# Patient Record
Sex: Female | Born: 1995 | Race: Black or African American | Hispanic: No | Marital: Single
Health system: Southern US, Community
[De-identification: ages and names within clinical notes are randomized; demographics above are authoritative.]

## PROBLEM LIST (undated history)

## (undated) ENCOUNTER — Emergency Department (HOSPITAL_COMMUNITY): Admission: EM | Source: Home / Self Care

## (undated) DIAGNOSIS — F259 Schizoaffective disorder, unspecified: Secondary | ICD-10-CM

## (undated) DIAGNOSIS — F419 Anxiety disorder, unspecified: Secondary | ICD-10-CM

## (undated) DIAGNOSIS — F32A Depression, unspecified: Secondary | ICD-10-CM

## (undated) DIAGNOSIS — F329 Major depressive disorder, single episode, unspecified: Secondary | ICD-10-CM

## (undated) DIAGNOSIS — F209 Schizophrenia, unspecified: Secondary | ICD-10-CM

## (undated) DIAGNOSIS — R4689 Other symptoms and signs involving appearance and behavior: Secondary | ICD-10-CM

---
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
## 2017-04-17 ENCOUNTER — Emergency Department (HOSPITAL_COMMUNITY)
Admission: EM | Admit: 2017-04-17 | Discharge: 2017-04-18 | Disposition: A | Discharge status: Other Acute Inpatient Hospital | Payer: Medicaid Other | Attending: Emergency Medicine | Admitting: Emergency Medicine

## 2017-04-17 DIAGNOSIS — F23 Brief psychotic disorder: Secondary | ICD-10-CM

## 2017-04-17 LAB — CBC WITH DIFFERENTIAL/PLATELET
Basophils Absolute: 0 10*3/uL (ref 0.0–0.1)
Basophils Relative: 0 %
EOS PCT: 0 %
Eosinophils Absolute: 0 10*3/uL (ref 0.0–0.7)
HCT: 37.6 % (ref 36.0–46.0)
Hemoglobin: 12.9 g/dL (ref 12.0–15.0)
LYMPHS ABS: 1.9 10*3/uL (ref 0.7–4.0)
LYMPHS PCT: 24 %
MCH: 30.7 pg (ref 26.0–34.0)
MCHC: 34.3 g/dL (ref 30.0–36.0)
MCV: 89.5 fL (ref 78.0–100.0)
MONO ABS: 0.6 10*3/uL (ref 0.1–1.0)
Monocytes Relative: 7 %
Neutro Abs: 5.5 10*3/uL (ref 1.7–7.7)
Neutrophils Relative %: 69 %
PLATELETS: 252 10*3/uL (ref 150–400)
RBC: 4.2 MIL/uL (ref 3.87–5.11)
RDW: 13 % (ref 11.5–15.5)
WBC: 7.9 10*3/uL (ref 4.0–10.5)

## 2017-04-17 LAB — COMPREHENSIVE METABOLIC PANEL
ALK PHOS: 67 U/L (ref 38–126)
ALT: 18 U/L (ref 14–54)
ANION GAP: 11 (ref 5–15)
AST: 25 U/L (ref 15–41)
Albumin: 4.5 g/dL (ref 3.5–5.0)
BUN: 18 mg/dL (ref 6–20)
CALCIUM: 9.6 mg/dL (ref 8.9–10.3)
CO2: 26 mmol/L (ref 22–32)
Chloride: 106 mmol/L (ref 101–111)
Creatinine, Ser: 1.02 mg/dL — ABNORMAL HIGH (ref 0.44–1.00)
GFR calc non Af Amer: >60 mL/min (ref >60)
Glucose, Bld: 89 mg/dL (ref 65–99)
Potassium: 3.6 mmol/L (ref 3.5–5.1)
SODIUM: 143 mmol/L (ref 135–145)
TOTAL PROTEIN: 9.2 g/dL — AB (ref 6.5–8.1)
Total Bilirubin: 0.8 mg/dL (ref 0.3–1.2)

## 2017-04-17 LAB — RAPID URINE DRUG SCREEN, HOSP PERFORMED
Amphetamines: NOT DETECTED
BENZODIAZEPINES: NOT DETECTED
Barbiturates: NOT DETECTED
COCAINE: NOT DETECTED
Opiates: NOT DETECTED
Tetrahydrocannabinol: NOT DETECTED

## 2017-04-17 LAB — I-STAT BETA HCG BLOOD, ED (MC, WL, AP ONLY): I-stat hCG, quantitative: <5 m[IU]/mL (ref <5)

## 2017-04-17 LAB — ETHANOL: Alcohol, Ethyl (B): <5 mg/dL (ref <5)

## 2017-04-17 LAB — ACETAMINOPHEN LEVEL: Acetaminophen (Tylenol), Serum: <10 ug/mL — ABNORMAL LOW (ref 10–30)

## 2017-04-17 LAB — SALICYLATE LEVEL: Salicylate Lvl: <7 mg/dL (ref 2.8–30.0)

## 2017-04-17 MED ORDER — ZIPRASIDONE MESYLATE 20 MG IM SOLR
INTRAMUSCULAR | Status: AC
  Filled 2017-04-17: qty 20

## 2017-04-17 MED ORDER — IBUPROFEN 200 MG PO TABS: 600.0000 mg | ORAL_TABLET | Freq: Four times a day (QID) | ORAL | Status: DC | PRN

## 2017-04-17 MED ORDER — LORAZEPAM 1 MG PO TABS
1.0000 mg | ORAL_TABLET | Freq: Four times a day (QID) | ORAL | Status: DC | PRN
Start: 2017-04-17 — End: 2017-04-18

## 2017-04-17 MED ORDER — DIPHENHYDRAMINE HCL 50 MG/ML IJ SOLN
INTRAMUSCULAR | Status: AC
  Administered 2017-04-17: 12:00:00
  Filled 2017-04-17: qty 1

## 2017-04-17 MED ORDER — ONDANSETRON 4 MG PO TBDP: 4.0000 mg | ORAL_TABLET | Freq: Three times a day (TID) | ORAL | Status: DC | PRN

## 2017-04-17 MED ORDER — DIPHENHYDRAMINE HCL 50 MG/ML IJ SOLN: 50.0000 mg | Freq: Once | INTRAMUSCULAR | Status: DC

## 2017-04-17 MED ORDER — ACETAMINOPHEN 325 MG PO TABS: 650.0000 mg | ORAL_TABLET | Freq: Four times a day (QID) | ORAL | Status: DC | PRN

## 2017-04-17 MED ORDER — ZIPRASIDONE MESYLATE 20 MG IM SOLR
20.0000 mg | Freq: Once | INTRAMUSCULAR | Status: AC
  Administered 2017-04-17: 20 mg via INTRAMUSCULAR

## 2017-04-17 MED ORDER — LORAZEPAM 2 MG/ML IJ SOLN
INTRAMUSCULAR | Status: AC
  Administered 2017-04-17: 2 mg
  Filled 2017-04-17: qty 1

## 2017-04-17 NOTE — ED Provider Notes (Addendum)
[facility]-EMERGENCY DEPT Provider Note   CSN: [id number] Arrival date & time: 04/17/17  1157     History   Chief Complaint Chief Complaint  Patient presents with  . Medical Clearance    HPI Lindsey Richmond is a 21 y.o. female.  HPI  LEVEL 5 CAVEAT FOR PSYCHIATRIC DISORDER  Pt brought in to the ER with cc of psychoses. Pt is Lindsey Richmond. Per EMS pt was found in the streets [place], naked and holding a sign HEAVEN / HELL. Pt was calling for Jesus, but was calm, as soon as she arrived to the ER she became aggressive and started yelling. Pt doesn't give me much history. She tells me that "Jesus loves her". She tells me her name is Lindsey Richmond.    No past medical history on file.  There are no active problems to display for this patient.   No past surgical history on file.  OB History    No data available       Home Medications    Prior to Admission medications   Not on File    Family History No family history on file.  Social History Social History  Substance Use Topics  . Smoking status: Not on file  . Smokeless tobacco: Not on file  . Alcohol use Not on file     Allergies   Patient has no allergy information on record.   Review of Systems Review of Systems  Unable to perform ROS: Psychiatric disorder     Physical Exam Updated Vital Signs BP (!) 107/56 (BP Location: Right Leg)   Pulse 93   Temp 98.3 F (36.8 C) (Oral)   Resp 16   SpO2 96%   Physical Exam  Constitutional: She appears well-developed.  HENT:  Head: Normocephalic and atraumatic.  Eyes: EOM are normal.  Neck: Normal range of motion. Neck supple.  Cardiovascular:  TACHYCARDIA  Pulmonary/Chest: Effort normal.  Abdominal: Bowel sounds are normal.  Neurological: She is alert.  Skin: Skin is warm and dry.  Psychiatric:  Responding to internal stimuli  Nursing note and vitals reviewed.    ED Treatments / Results  Labs (all labs ordered are listed, but only  abnormal results are displayed) Labs Reviewed  COMPREHENSIVE METABOLIC PANEL - Abnormal; Notable for the following:       Result Value   Creatinine, Ser 1.02 (*)    Total Protein 9.2 (*)    All other components within normal limits  ACETAMINOPHEN LEVEL - Abnormal; Notable for the following:    Acetaminophen (Tylenol), Serum <10 (*)    All other components within normal limits  CBC WITH DIFFERENTIAL/PLATELET  ETHANOL  SALICYLATE LEVEL  RAPID URINE DRUG SCREEN, HOSP PERFORMED  I-STAT BETA HCG BLOOD, ED ([facility], [facility], [facility] ONLY)    EKG  EKG Interpretation  Date/Time:  Tuesday April 17 2017 13:41:29 EDT Ventricular Rate:  125 PR Interval:  182 QRS Duration: 74 QT Interval:  214 QTC Calculation: 308 R Axis:   20 Text Interpretation:  Sinus tachycardia Right atrial enlargement Nonspecific T wave abnormality Abnormal ECG No acute changes Nonspecific ST and T wave abnormality No old tracing to compare Confirmed by Derwood Kaplananavati, Leeloo Silverthorne [phone]([id number]) on 04/17/2017 5:36:04 PM        EKG Interpretation  Date/Time:  Tuesday April 17 2017 17:43:43 EDT Ventricular Rate:  92 PR Interval:  170 QRS Duration: 80 QT Interval:  370 QTC Calculation: 457 R Axis:   35 Text Interpretation:  Normal sinus rhythm  Nonspecific T wave abnormality Abnormal ECG tachycardia resolved Confirmed by Derwood Kaplan [phone]) on 04/17/2017 5:50:36 PM         Radiology No results found.  Procedures Procedures (including critical care time)  Medications Ordered in ED Medications  ziprasidone (GEODON) 20 MG injection (  Not Given 04/17/17 1315)  diphenhydrAMINE (BENADRYL) injection 50 mg (50 mg Intramuscular Not Given 04/17/17 1315)  LORazepam (ATIVAN) tablet 1 mg (not administered)  acetaminophen (TYLENOL) tablet 650 mg (not administered)  ibuprofen (ADVIL,MOTRIN) tablet 600 mg (not administered)  ondansetron (ZOFRAN-ODT) disintegrating tablet 4 mg (not administered)  ziprasidone (GEODON) injection 20 mg (20 mg  Intramuscular Given 04/17/17 1215)  diphenhydrAMINE (BENADRYL) 50 MG/ML injection (  Given 04/17/17 1215)  LORazepam (ATIVAN) 2 MG/ML injection (2 mg  Given 04/17/17 1314)     Initial Impression / Assessment and Plan / ED Course  I have reviewed the triage vital signs and the nursing notes.  Pertinent labs & imaging results that were available during my care of the patient were reviewed by me and considered in my medical decision making (see chart for details).     Pt found naked on the streets and calling for Jesus. She arrives to the ER yelling "Jesus [id number] you and you [id number] be judged". Pt tells me that her name is Lindsey File. We will give her some geodon for her safety and the safety of our staff. I will start the IVC paperwork.  Final Clinical Impressions(s) / ED Diagnoses   Final diagnoses:  Acute psychosis    New Prescriptions New Prescriptions   No medications on file     Derwood Kaplan, MD 04/17/17 1218    Derwood Kaplan, MD 04/17/17 1736    Derwood Kaplan, MD 04/17/17 1736    Derwood Kaplan, MD 04/17/17 1751

## 2017-04-17 NOTE — BH Assessment (Addendum)
Tele Assessment Note   Lindsey Richmond is an 21 y.o. female, who is voluntary and unaccompanied to [facility]. Pt's IVC paperwork is pending. Pt was a poor historian during the assessment. Clinician asked the pt "what brought you to the hospital?" Pt reported, "I was professing to Jesus." Pt reported, "its [address] or [place]. Jesus is the only way to [place], the end is near." Clinician asked the pt if she was experiencing hallucinations? Pt responded, "I don't know." Pt denies, SI, HI, access to weapons.   Per EDP note: "Pt brought in to the ER with cc of psychoses. Pt is Lindsey Richmond. Per EMS pt was found in the streets [place] of [place], naked and holding a sign HEAVEN / HELL. Pt was calling for Jesus, but was calm, as soon as she arrived to the ER she became aggressive and started yelling. Pt doesn't give me much history. She tells me that "Jesus loves her". She tells me her name is Lindsey Richmond."   Clinician was unable to assess the following: history of abuse, self-injurious behaviors, history of violence, contract for safety, education status. Pt's UDS is negative. Pt reported, seeing Dr. Jannifer Franklin last week for counseling and Dr. Christell Constant, an Autism assessment specialist  to rule out Autism or Asperger's Syndrome. Pt denied she was prescribed medication. Pt reported, previous inpatient admissions to [facility].   Pt presented quiet/awake bizarre in scrubs with slow/soft speech. Pt's eye contact was poor. Pt's mood was despair/preoccipied. Pt's affect is flat. Pt's thought process is circumstantial/tangential. Pt's judgement is impaired. Pt's concentration was decreased. Pt's insight and impulse control are poor. Pt was not oriented. Pt reported, if inpatient treatment is recommended she would sign-in voluntarily.   Diagnosis: Unspecified schizophrenia spectrum and other psychotic disorder.  Past Medical History: No past medical history on Richmond.  No past surgical history on Richmond.  Family History: No family  history on Richmond.  Social History:  has no tobacco, alcohol, and drug history on Richmond.  Additional Social History:  Alcohol / Drug Use Pain Medications: See MAR Prescriptions: See MAR Over the Counter: See  MAR History of alcohol / drug use?:  (UTA is pending. )  CIWA: CIWA-Ar BP: 121/71 Pulse Rate: (!) 111 COWS:    PATIENT STRENGTHS: (choose at least two) Average or above average intelligence General fund of knowledge  Allergies: No Known Allergies  Home Medications:  (Not in a hospital admission)  OB/GYN Status:  No LMP recorded.  General Assessment Data Location of Assessment: [facility] ED TTS Assessment: In system Is this a Tele or Face-to-Face Assessment?: Face-to-Face Is this an Initial Assessment or a Re-assessment for this encounter?: Initial Assessment Marital status: Single Living Arrangements: Other (Comment) (Pt reported, with a friend. ) Can pt return to current living arrangement?: Yes Admission Status: Voluntary Referral Source: Self/Family/Friend Insurance type: Self-pay.     Crisis Care Plan Living Arrangements: Other (Comment) (Pt reported, with a friend. ) Legal Guardian: Other: (Self) Name of Psychiatrist: Dr. Jannifer Franklin Name of Therapist: Dr. Christell Constant.  Education Status Is patient currently in school?:  (UTA) Current Grade: UTA Highest grade of school patient has completed: UTA Name of school: UTA Contact person: UTA  Risk to self with the past 6 months Suicidal Ideation: No (Pt denies. ) Has patient been a risk to self within the past 6 months prior to admission? : No Suicidal Intent: No Has patient had any suicidal intent within the past 6 months prior to admission? : No Is patient at risk  for suicide?: No Suicidal Plan?: No Has patient had any suicidal plan within the past 6 months prior to admission? : No Access to Means: No What has been your use of drugs/alcohol within the last 12 months?: UDS is pending. Previous Attempts/Gestures: No How  many times?: 0 Other Self Harm Risks: UTA Triggers for Past Attempts: None known Intentional Self Injurious Behavior:  (UTA) Family Suicide History: Unable to assess Recent stressful life event(s): Other (Comment) (UTA) Persecutory voices/beliefs?:  [name]) Depression:  (UTA) Depression Symptoms:  (UTA) Substance abuse history and/or treatment for substance abuse?:  (UTA) Suicide prevention information given to non-admitted patients: Not applicable  Risk to Others within the past 6 months Homicidal Ideation: No (Pt denies.) Does patient have any lifetime risk of violence toward others beyond the six months prior to admission? : No Thoughts of Harm to Others: No Current Homicidal Intent: No Current Homicidal Plan: No Access to Homicidal Means: No Identified Victim: NA History of harm to others?:  (UTA) Assessment of Violence: None Noted Violent Behavior Description: UTA Does patient have access to weapons?: No (Pt denies. ) Criminal Charges Pending?: No Does patient have a court date: No Is patient on probation?: No  Psychosis Hallucinations: None noted Delusions: Persecutory  Mental Status Report Appearance/Hygiene: In scrubs, Bizarre Eye Contact: Poor Motor Activity: Freedom of movement Speech: Slow, Soft Level of Consciousness: Quiet/awake Mood: Despair, Preoccupied Affect: Flat Anxiety Level: None Thought Processes: Circumstantial, Tangential Judgement: Impaired Orientation: Not oriented Obsessive Compulsive Thoughts/Behaviors: None  Cognitive Functioning Concentration: Decreased Memory: Recent Impaired IQ: Average Insight: Poor Impulse Control: Poor Appetite: Poor Weight Loss:  (UTA) Weight Gain:  (UTA) Sleep: Decreased Total Hours of Sleep: 5 Vegetative Symptoms: Unable to Assess  ADLScreening [facility] Assessment Services) Patient's cognitive ability adequate to safely complete daily activities?: Yes Patient able to express need for assistance with ADLs?:  Yes Independently performs ADLs?: Yes (appropriate for developmental age)  Prior Inpatient Therapy Prior Inpatient Therapy: Yes Prior Therapy Dates: UTA Prior Therapy Facilty/Provider(s): [facility]  Reason for Treatment: UTA  Prior Outpatient Therapy Prior Outpatient Therapy: Yes Prior Therapy Facilty/Provider(s): Current Reason for Treatment: Counseling. Does patient have an ACCT team?: No Does patient have Intensive In-House Services?  : No Does patient have [facility] services? : No Does patient have P4CC services?: No  ADL Screening (condition at time of admission) Patient's cognitive ability adequate to safely complete daily activities?: Yes Is the patient deaf or have difficulty hearing?: No Does the patient have difficulty seeing, even when wearing glasses/contacts?: No Does the patient have difficulty concentrating, remembering, or making decisions?: Yes Patient able to express need for assistance with ADLs?: Yes Does the patient have difficulty dressing or bathing?: No Independently performs ADLs?: Yes (appropriate for developmental age) Does the patient have difficulty walking or climbing stairs?: Yes (Pt is unsteady walking. ) Weakness of Legs: None Weakness of Arms/Hands: None       Abuse/Neglect Assessment (Assessment to be complete while patient is alone) Physical Abuse:  (UTA) Verbal Abuse:  (UTA) Sexual Abuse:  (UTA) Exploitation of patient/patient's resources:  (UTA) Self-Neglect:  (UTA)     Advance Directives (For Healthcare) Does Patient Have a Medical Advance Directive?: No Would patient like information on creating a medical advance directive?: No - Patient declined    Additional Information 1:1 In Past 12 Months?: No CIRT Risk: No Elopement Risk: No Does patient have medical clearance?: No     Disposition: Donell Sievert, PA recommends inpatient treatment. Disposition discussed with Joanie Coddington, RN. TTS  to seek placement.    Disposition Initial  Assessment Completed for this Encounter: Yes Disposition of Patient:  (Pending.)  Redmond Pullingreylese D Linah Klapper 04/17/2017 9:47 PM   Redmond Pullingreylese D Margreat Widener, MS, [facility], [facility] Triage Specialist [phone]

## 2017-04-17 NOTE — ED Notes (Signed)
Pt presents with bizarre behavior, found walking down [address] naked, holding a [employer] Sign.  Calling for Jesus, was aggressive and started yelling.  Pt calm & cooperative at present.  Awake, alert & responsive, no distress noted.  Pt not forthcoming with information.  Monitoring for safety, Q 15 min checks in effect.  Safety check for contraband completed, no items found.

## 2017-04-17 NOTE — ED Notes (Signed)
Patient is sleeping and cooperative at this time.

## 2017-04-17 NOTE — BH Assessment (Signed)
EDP (Dr. Rhunette CroftNanavati) ordered a TTS order for this patient. Patient sleeping at this time. Unable to assess. Patient's nurse Britta MccreedyBarbara states that patient was given Geodon. EDP made aware and agreed to remove the consult. Writer asked patient's nurse to notify TTS when patient awakens and is able to be assessed by TTS.

## 2017-04-17 NOTE — ED Notes (Signed)
Bed: [id number] Expected date:  Expected time:  Means of arrival:  Comments: EMS-medical clearance

## 2017-04-17 NOTE — ED Triage Notes (Signed)
Per EMS, states patient was walking down [address] naked holding a sign "[place]"-religiously preoccupied, guarded, and nonresponsive to cueing/redirection

## 2017-04-18 ENCOUNTER — Inpatient Hospital Stay (HOSPITAL_COMMUNITY)
Admission: AD | Admit: 2017-04-18 | Discharge: 2017-04-25 | DRG: 885 | Disposition: A | Discharge status: Home / Self Care | Payer: Medicaid Other | Attending: Psychiatry | Admitting: Psychiatry

## 2017-04-18 ENCOUNTER — Encounter (HOSPITAL_COMMUNITY): Payer: Self-pay | Admitting: *Deleted

## 2017-04-18 DIAGNOSIS — F319 Bipolar disorder, unspecified: Secondary | ICD-10-CM | POA: Diagnosis present

## 2017-04-18 DIAGNOSIS — G4700 Insomnia, unspecified: Secondary | ICD-10-CM | POA: Diagnosis present

## 2017-04-18 DIAGNOSIS — F23 Brief psychotic disorder: Secondary | ICD-10-CM

## 2017-04-18 DIAGNOSIS — I959 Hypotension, unspecified: Secondary | ICD-10-CM | POA: Diagnosis present

## 2017-04-18 DIAGNOSIS — Z818 Family history of other mental and behavioral disorders: Secondary | ICD-10-CM

## 2017-04-18 DIAGNOSIS — F200 Paranoid schizophrenia: Secondary | ICD-10-CM | POA: Diagnosis not present

## 2017-04-18 DIAGNOSIS — R443 Hallucinations, unspecified: Secondary | ICD-10-CM

## 2017-04-18 MED ORDER — DIPHENHYDRAMINE HCL 50 MG/ML IJ SOLN
50.0000 mg | Freq: Once | INTRAMUSCULAR | Status: AC
  Administered 2017-04-18: 50 mg via INTRAMUSCULAR
  Filled 2017-04-18 (×2): qty 1

## 2017-04-18 MED ORDER — ACETAMINOPHEN 325 MG PO TABS: 650.0000 mg | ORAL_TABLET | Freq: Four times a day (QID) | ORAL | Status: DC | PRN

## 2017-04-18 MED ORDER — ZIPRASIDONE MESYLATE 20 MG IM SOLR
20.0000 mg | Freq: Once | INTRAMUSCULAR | Status: AC
  Administered 2017-04-18: 20 mg via INTRAMUSCULAR
  Filled 2017-04-18 (×2): qty 20

## 2017-04-18 MED ORDER — LORAZEPAM 2 MG/ML IJ SOLN
1.0000 mg | Freq: Once | INTRAMUSCULAR | Status: AC
  Administered 2017-04-18: 1 mg via INTRAMUSCULAR
  Filled 2017-04-18: qty 1

## 2017-04-18 MED ORDER — ZIPRASIDONE MESYLATE 20 MG IM SOLR
20.0000 mg | Freq: Once | INTRAMUSCULAR | Status: AC
  Administered 2017-04-18: 20 mg via INTRAMUSCULAR
  Filled 2017-04-18: qty 20

## 2017-04-18 MED ORDER — ONDANSETRON 4 MG PO TBDP: 4.0000 mg | ORAL_TABLET | Freq: Three times a day (TID) | ORAL | Status: DC | PRN

## 2017-04-18 MED ORDER — DIPHENHYDRAMINE HCL 50 MG/ML IJ SOLN
50.0000 mg | Freq: Once | INTRAMUSCULAR | Status: AC
  Administered 2017-04-18: 50 mg via INTRAMUSCULAR
  Filled 2017-04-18: qty 1

## 2017-04-18 MED ORDER — LORAZEPAM 1 MG PO TABS
1.0000 mg | ORAL_TABLET | Freq: Four times a day (QID) | ORAL | Status: DC | PRN
  Administered 2017-04-18 – 2017-04-20 (×4): 1 mg via ORAL
  Filled 2017-04-18 (×6): qty 1

## 2017-04-18 MED ORDER — RISPERIDONE 1 MG PO TABS
1.0000 mg | ORAL_TABLET | Freq: Two times a day (BID) | ORAL | Status: DC
  Filled 2017-04-18: qty 1

## 2017-04-18 MED ORDER — MAGNESIUM HYDROXIDE 400 MG/5ML PO SUSP: 30.0000 mL | Freq: Every day | ORAL | Status: DC | PRN

## 2017-04-18 MED ORDER — ALUM & MAG HYDROXIDE-SIMETH 200-200-20 MG/5ML PO SUSP: 30.0000 mL | ORAL | Status: DC | PRN

## 2017-04-18 MED ORDER — RISPERIDONE 1 MG PO TABS: 1.0000 mg | ORAL_TABLET | Freq: Two times a day (BID) | ORAL | Status: DC

## 2017-04-18 MED ORDER — LORAZEPAM 2 MG/ML IJ SOLN
2.0000 mg | Freq: Once | INTRAMUSCULAR | Status: AC
  Administered 2017-04-18: 2 mg via INTRAMUSCULAR
  Filled 2017-04-18: qty 1

## 2017-04-18 MED ORDER — IBUPROFEN 600 MG PO TABS
600.0000 mg | ORAL_TABLET | Freq: Four times a day (QID) | ORAL | Status: DC | PRN
  Administered 2017-04-18: 600 mg via ORAL
  Filled 2017-04-18: qty 1

## 2017-04-18 NOTE — Consult Note (Signed)
[facility] Consult   Reason for Consult:  Psychosis  Referring Physician:  EDP Patient Identification: Lindsey Richmond MRN:  [id number] Principal Diagnosis: Acute psychosis Diagnosis:   Patient Active Problem List   Diagnosis Date Noted  . Acute psychosis [F23] 04/18/2017    Priority: High    Total Time spent with patient: 45 minutes  Subjective:   Lindsey Richmond is a 21 y.o. female patient admitted with psychosis.  HPI:  21 yo female who presented to the ED under IVC after running naked down the street holding a sign stating, "Heaven or Hell."  Disorganized, delusional, hyper-religious.  Difficult to get a thorough assessment due to her psychosis but denies suicidal/homicidal ideations or alcohol/drug abuse--drug and alcohol screen is negative.  Past Psychiatric History: "bizarre behaviors", family sought outpatient psychiatry but refused to put her on medications.  Risk to Self: Suicidal Ideation: No (Pt denies. ) Suicidal Intent: No Is patient at risk for suicide?: No Suicidal Plan?: No Access to Means: No What has been your use of drugs/alcohol within the last 12 months?: UDS is pending. How many times?: 0 Other Self Harm Risks: UTA Triggers for Past Attempts: None known Intentional Self Injurious Behavior:  (UTA) Risk to Others: Homicidal Ideation: No (Pt denies.) Thoughts of Harm to Others: No Current Homicidal Intent: No Current Homicidal Plan: No Access to Homicidal Means: No Identified Victim: NA History of harm to others?:  (UTA) Assessment of Violence: None Noted Violent Behavior Description: UTA Does patient have access to weapons?: No (Pt denies. ) Criminal Charges Pending?: No Does patient have a court date: No Prior Inpatient Therapy: Prior Inpatient Therapy: Yes Prior Therapy Dates: UTA Prior Therapy Facilty/Provider(s): [facility]  Reason for Treatment: UTA Prior Outpatient Therapy: Prior Outpatient Therapy: Yes Prior Therapy  Facilty/Provider(s): Current Reason for Treatment: Counseling. Does patient have an ACCT team?: No Does patient have Intensive In-House Services?  : No Does patient have [facility] services? : No Does patient have [facility] services?: No  Past Medical History: No past medical history on file. No past surgical history on file. Family History: No family history on file. Family Psychiatric  History: unknown Social History:  History  Alcohol use Not on file     History  Drug use: Unknown    Social History   Social History  . Marital status: Single    Spouse name: N/A  . Number of children: N/A  . Years of education: N/A   Social History Main Topics  . Smoking status: Not on file  . Smokeless tobacco: Not on file  . Alcohol use Not on file  . Drug use: Unknown  . Sexual activity: Not on file   Other Topics Concern  . Not on file   Social History Narrative  . No narrative on file   Additional Social History:    Allergies:  No Known Allergies  Labs:  Results for orders placed or performed during the hospital encounter of 04/17/17 (from the past 48 hour(s))  Comprehensive metabolic panel     Status: Abnormal   Collection Time: 04/17/17  1:44 PM  Result Value Ref Range   Sodium 143 135 - 145 mmol/L   Potassium 3.6 3.5 - 5.1 mmol/L   Chloride 106 101 - 111 mmol/L   CO2 26 22 - 32 mmol/L   Glucose, Bld 89 65 - 99 mg/dL   BUN 18 6 - 20 mg/dL   Creatinine, Ser 1.02 (H) 0.44 - 1.00 mg/dL   Calcium 9.6 8.9 -  10.3 mg/dL   Total Protein 9.2 (H) 6.5 - 8.1 g/dL   Albumin 4.5 3.5 - 5.0 g/dL   AST 25 15 - 41 U/L   ALT 18 14 - 54 U/L   Alkaline Phosphatase 67 38 - 126 U/L   Total Bilirubin 0.8 0.3 - 1.2 mg/dL   GFR calc non Af Amer >60 >60 mL/min   GFR calc Af Amer >60 >60 mL/min    Comment: (NOTE) The eGFR has been calculated using the CKD EPI equation. This calculation has not been validated in all clinical situations. eGFR's persistently <60 mL/min signify possible Chronic  Kidney Disease.    Anion gap 11 5 - 15  CBC with Diff     Status: None   Collection Time: 04/17/17  1:44 PM  Result Value Ref Range   WBC 7.9 4.0 - 10.5 K/uL   RBC 4.20 3.87 - 5.11 MIL/uL   Hemoglobin 12.9 12.0 - 15.0 g/dL   HCT 37.6 36.0 - 46.0 %   MCV 89.5 78.0 - 100.0 fL   MCH 30.7 26.0 - 34.0 pg   MCHC 34.3 30.0 - 36.0 g/dL   RDW 13.0 11.5 - 15.5 %   Platelets 252 150 - 400 K/uL   Neutrophils Relative % 69 %   Neutro Abs 5.5 1.7 - 7.7 K/uL   Lymphocytes Relative 24 %   Lymphs Abs 1.9 0.7 - 4.0 K/uL   Monocytes Relative 7 %   Monocytes Absolute 0.6 0.1 - 1.0 K/uL   Eosinophils Relative 0 %   Eosinophils Absolute 0.0 0.0 - 0.7 K/uL   Basophils Relative 0 %   Basophils Absolute 0.0 0.0 - 0.1 K/uL  Acetaminophen level     Status: Abnormal   Collection Time: 04/17/17  1:44 PM  Result Value Ref Range   Acetaminophen (Tylenol), Serum <10 (L) 10 - 30 ug/mL    Comment:        THERAPEUTIC CONCENTRATIONS VARY SIGNIFICANTLY. A RANGE OF 10-30 ug/mL MAY BE AN EFFECTIVE CONCENTRATION FOR MANY PATIENTS. HOWEVER, SOME ARE BEST TREATED AT CONCENTRATIONS OUTSIDE THIS RANGE. ACETAMINOPHEN CONCENTRATIONS >150 ug/mL AT 4 HOURS AFTER INGESTION AND >50 ug/mL AT 12 HOURS AFTER INGESTION ARE OFTEN ASSOCIATED WITH TOXIC REACTIONS.   Ethanol     Status: None   Collection Time: 04/17/17  1:44 PM  Result Value Ref Range   Alcohol, Ethyl (B) <5 <5 mg/dL    Comment:        LOWEST DETECTABLE LIMIT FOR SERUM ALCOHOL IS 5 mg/dL FOR MEDICAL PURPOSES ONLY   Salicylate level     Status: None   Collection Time: 04/17/17  1:44 PM  Result Value Ref Range   Salicylate Lvl <1.9 2.8 - 30.0 mg/dL  I-Stat Beta hCG blood, ED ([facility], [facility], [facility] only)     Status: None   Collection Time: 04/17/17  1:51 PM  Result Value Ref Range   I-stat hCG, quantitative <5.0 <5 mIU/mL   Comment 3            Comment:   GEST. AGE      CONC.  (mIU/mL)   <=1 WEEK        5 - 50     2 WEEKS       50 - 500     3 WEEKS        100 - 10,000     4 WEEKS     1,000 - 30,000        FEMALE AND  NON-PREGNANT FEMALE:     LESS THAN 5 mIU/mL   Urine rapid drug screen (hosp performed)     Status: None   Collection Time: 04/17/17  9:16 PM  Result Value Ref Range   Opiates NONE DETECTED NONE DETECTED   Cocaine NONE DETECTED NONE DETECTED   Benzodiazepines NONE DETECTED NONE DETECTED   Amphetamines NONE DETECTED NONE DETECTED   Tetrahydrocannabinol NONE DETECTED NONE DETECTED   Barbiturates NONE DETECTED NONE DETECTED    Comment:        DRUG SCREEN FOR MEDICAL PURPOSES ONLY.  IF CONFIRMATION IS NEEDED FOR ANY PURPOSE, NOTIFY LAB WITHIN 5 DAYS.        LOWEST DETECTABLE LIMITS FOR URINE DRUG SCREEN Drug Class       Cutoff (ng/mL) Amphetamine      1000 Barbiturate      200 Benzodiazepine   409 Tricyclics       811 Opiates          300 Cocaine          300 THC              50     Current Facility-Administered Medications  Medication Dose Route Frequency Provider Last Rate Last Dose  . acetaminophen (TYLENOL) tablet 650 mg  650 mg Oral Q6H PRN Nanavati, Ankit, MD      . diphenhydrAMINE (BENADRYL) injection 50 mg  50 mg Intramuscular Once [name], NP      . ibuprofen (ADVIL,MOTRIN) tablet 600 mg  600 mg Oral Q6H PRN Nanavati, Ankit, MD      . LORazepam (ATIVAN) tablet 1 mg  1 mg Oral Q6H PRN Kathrynn Humble, Ankit, MD      . ondansetron (ZOFRAN-ODT) disintegrating tablet 4 mg  4 mg Oral Q8H PRN Varney Biles, MD      . Derrill Memo ON 04/19/2017] risperiDONE (RISPERDAL) tablet 1 mg  1 mg Oral BID [name], NP       Current Outpatient Prescriptions  Medication Sig Dispense Refill  . minocycline (MINOCIN,DYNACIN) 100 MG capsule Take 100 mg by mouth 2 (two) times daily.      Musculoskeletal: Strength & Muscle Tone: within normal limits Gait & Station: normal Patient leans: N/A  Psychiatric Specialty Exam: Physical Exam  Constitutional: She is oriented to person, place, and time. She appears well-developed  and well-nourished.  HENT:  Head: Normocephalic.  Neck: Normal range of motion.  Respiratory: Effort normal.  Musculoskeletal: Normal range of motion.  Neurological: She is alert and oriented to person, place, and time.  Psychiatric: Her affect is blunt. Her speech is delayed. She is actively hallucinating. Thought content is delusional. Cognition and memory are impaired. She expresses impulsivity and inappropriate judgment.    Review of Systems  Psychiatric/Behavioral: Positive for hallucinations.    Blood pressure 121/71, pulse (!) 111, temperature 98.2 F (36.8 C), temperature source Oral, resp. rate 14, SpO2 100 %.There is no height or weight on file to calculate BMI.  General Appearance: Casual  Eye Contact:  Minimal  Speech:  Slow  Volume:  Decreased  Mood:  Dysphoric  Affect:  Blunt  Thought Process:  Disorganized and Descriptions of Associations: Loose  Orientation:  Other:  person  Thought Content:  Delusions and Hallucinations: Auditory Visual  Suicidal Thoughts:  No  Homicidal Thoughts:  No  Memory:  Immediate;   Poor Recent;   Poor Remote;   Poor  Judgement:  Impaired  Insight:  Lacking  Psychomotor Activity:  Decreased  Concentration:  Concentration: Poor and Attention Span: Poor  Recall:  Poor  Fund of Knowledge:  Fair  Language:  Fair  Akathisia:  No  Handed:  Right  AIMS (if indicated):     Assets:  Housing Leisure Time Physical Health Resilience Social Support  ADL's:  Intact  Cognition:  Impaired,  Moderate  Sleep:        Treatment Plan Summary: Daily contact with patient to assess and evaluate symptoms and progress in treatment, Medication management and Plan acute psychosis:  -Crisis stabilization -Medication management:  PRN agitation medications provided, started Risperdal 1 mg BID for psychosis/mood -Individual counseling  Disposition: Recommend psychiatric Inpatient admission when medically cleared.  Waylan Boga, NP 04/18/2017 10:20  AM  Patient seen face-to-face for psychiatric evaluation, chart reviewed and case discussed with the physician extender and developed treatment plan. Reviewed the information documented and agree with the treatment plan. Corena Pilgrim, MD

## 2017-04-18 NOTE — ED Notes (Signed)
Pt screaming, " Fuck Me, Fuck Me, responding to internal stimuli.  Pt in Fetal position on floor at present.

## 2017-04-18 NOTE — ED Notes (Signed)
Dr Allen into see 

## 2017-04-18 NOTE — ED Notes (Signed)
Pt ambulatory w/o difficulty to [facility] with [employer], belongings given to officers. 

## 2017-04-18 NOTE — Tx Team (Signed)
Initial Treatment Plan 04/18/2017 7:48 PM Lindsey Richmond EAV:[id number]    PATIENT STRESSORS: Other: psychosis   PATIENT STRENGTHS: Physical Health Supportive family/friends   PATIENT IDENTIFIED PROBLEMS: Psychosis  * Patient unable/unwilling to participate in admission due to current state of psychosis                   DISCHARGE CRITERIA:  Ability to meet basic life and health needs Improved stabilization in mood, thinking, and/or behavior Motivation to continue treatment in a less acute level of care Need for constant or close observation no longer present Reduction of life-threatening or endangering symptoms to within safe limits Verbal commitment to aftercare and medication compliance  PRELIMINARY DISCHARGE PLAN: Outpatient therapy Return to previous living arrangement  PATIENT/FAMILY INVOLVEMENT: This treatment plan has been presented to and reviewed with the patient, Lindsey FileMaiya Richmond.  The patient and family have been given the opportunity to ask questions and make suggestions.  Carleene OverlieMiddleton, Jelani Vreeland P, RN 04/18/2017, 7:48 PM

## 2017-04-18 NOTE — BH Assessment (Addendum)
[facility] Assessment Progress Note  Per Thedore MinsMojeed Akintayo, MD, this pt requires psychiatric hospitalization.  Berneice Heinrichina Tate, RN, [facility].  Per Nanine MeansJamison Lord, DNP, this pt meets criteria for IVC; EDP Lorre NickAnthony Allen, MD concurs with this finding, and has initiated IVC.  [place] [facility], and at 12:58 Hortense RamalMagistrate Watts confirms receipt.  As of this writing, service of Findings and Custody Order is pending.  Pt's nurse, Wille CelesteJanie, has been notified, and agrees to call report to [phone].  Pt is to be transported via [employer] enforcement.   Doylene Canninghomas Ayahna Solazzo, MA Triage Specialist [phone]   Addendum:  Findings and Custody Order has been served, and [facility].  Doylene Canninghomas Antwane Grose, MA Triage Specialist [phone]

## 2017-04-18 NOTE — Progress Notes (Signed)
Psychoeducational Group Note  Date:  04/18/2017 Time:  2116  Group Topic/Focus:  Wrap-Up Group:   The focus of this group is to help patients review their daily goal of treatment and discuss progress on daily workbooks.  Participation Level: Did Not Attend  Participation Quality:  Not Applicable  Affect:  Not Applicable  Cognitive:  Not Applicable  Insight:  Not Applicable  Engagement in Group: Not Applicable  Additional Comments: The patient did not attend group since was agitated and remained in her room.   Hazle CocaGOODMAN, Jordy Hewins S 04/18/2017, 9:16 PM

## 2017-04-18 NOTE — ED Notes (Addendum)
Pt up in the hall waving her arms, easily redirected back to room.  Pt is [facility]. Pt with minimal verbal responses.

## 2017-04-18 NOTE — ED Notes (Signed)
Dr Lenore CordiaAkintyo and Catha NottinghamJamison DNP into see.  PT sleepy, very soft spoken, not able to participate in assessment at this time.

## 2017-04-18 NOTE — Progress Notes (Signed)
Admission Note:  21 year old female who presents IVC, in no acute distress, for the treatment of psychosis. Patient presents with an intimidating intense glare. During the admission, patient preceded to rock back and forth and made punching movements with her hands towards Clinical research associatewriter. Additionally, patient held up her fingers in a "cross" gesture.  Patient is hyper religious and would not respond when she had the intense glare. At times, patient appeared to clear up and would follow simple commands. Patient appeared preoccupied and patient did not answer any admission questions  Per report, patient was walking down the street, naked, with a sign that read "heaven or hell". Skin was assessed. Patient has healed acne scars all over patient's chest.  Patient searched and no contraband found, POC and unit policies explained and understanding verbalized. Consents obtained.  Patient had no additional questions or concerns.

## 2017-04-19 MED ORDER — ZIPRASIDONE MESYLATE 20 MG IM SOLR
20.0000 mg | INTRAMUSCULAR | Status: DC | PRN
  Filled 2017-04-19: qty 20

## 2017-04-19 MED ORDER — OLANZAPINE 10 MG PO TBDP
10.0000 mg | ORAL_TABLET | Freq: Three times a day (TID) | ORAL | Status: DC | PRN
  Administered 2017-04-19 – 2017-04-21 (×4): 10 mg via ORAL
  Filled 2017-04-19 (×4): qty 1

## 2017-04-19 MED ORDER — RISPERIDONE 1 MG PO TBDP
1.0000 mg | ORAL_TABLET | Freq: Two times a day (BID) | ORAL | Status: DC
  Administered 2017-04-19 – 2017-04-20 (×3): 1 mg via ORAL
  Filled 2017-04-19 (×9): qty 1

## 2017-04-19 MED ORDER — LORAZEPAM 1 MG PO TABS: 1.0000 mg | ORAL_TABLET | ORAL | Status: DC | PRN

## 2017-04-19 MED ORDER — LORAZEPAM 1 MG PO TABS
1.0000 mg | ORAL_TABLET | Freq: Once | ORAL | Status: AC
Start: 2017-04-19 — End: 2017-04-19
  Administered 2017-04-19: 1 mg via ORAL
  Filled 2017-04-19: qty 1

## 2017-04-19 NOTE — Progress Notes (Signed)
D: Pt observed yelling in her room "get out bitch, repent, I told you to repent". Attempted verbal redirection but to no avail as pt continued to escalate in her behavior. Observed clenching her fist, hitting her bed and staring at staff while raising her voice. Paranoid "I don't trust you, I don't know this medicine, I don't want it" Pt spat out Ativan 1 mg PO on first attempt, however, she took when on the second trial.  A: PRN and scheduled medications (see emar) administered as ordered with positive effect. Continued encouragement and support offered. 1:1 observation remains effective as ordered. Assigned staff in attendance at all times.  R: Pt calm at this time. Awake in bed without further outburst. Remains safe on unit.

## 2017-04-19 NOTE — [facility] Counselor (Addendum)
[facility] [place] [facility] per Sondra BargesJ Clark RN CM.  Per chart review, patient was seen by [facility] - per clinic staff, patient had one visit w Dolphus Jennyracy Hampton NP on 03/28/17 and no medications were prescribed.  Was scheduled to see therapist at this practice this week but did not show for appointment.  Emergency contact given at this practice is father Everardo BealsOni Tri [phone]([address] [place], [phone] - [phone] (home) and [phone] (cell).  Per [facility] service history, patient was only seen once this year at [facility] in June 2018.  [facility].  [facility] has no information on patient for period of time that patient did not have [facility].  CSW left HIPAA compliant VM for father on cell # (did not leave VM on home # as it was not identified) in attempt to complete PSA and gather collateral.  Contact # for mother, Benjaman LobeDiane Stuck [phone]) was not answered, no VM.  Santa GeneraAnne Cunningham, LCSW Lead Clinical Social Worker Phone:  [phone]

## 2017-04-19 NOTE — Social Work (Signed)
Per Marella Chimes Sutton, [facility], tatient has been seeing Dr Fleet ContrasEdwin Av[facility]uere [facility] at [phone]; [facility] [address]    Santa GeneraAnne Corben Auzenne, LCSW Lead Clinical Social Worker Phone:  [phone]

## 2017-04-19 NOTE — Progress Notes (Signed)
D: Pt awake in bed. Continues to smile, talks to self as if responding to internal stimuli. Fluids offered. Tolerated lunch well. Encouraged to take care of her ADLs but is not compliant thus far.  A: Support and encouragement provided to pt at this time. 1:1 observation maintained for safety. R: Safety maintained. Pt redirectable at present.

## 2017-04-19 NOTE — Progress Notes (Signed)
Pt is awake at this time.  She is again yelling "GET OUT" initially with her eyes closed lying in bed.  Just before 0600, pt was given Ativan 1 mg and Risperdal 1 mg per provider on the hall.  She got up shortly after.  The sitter reports she is unsteady, but does not want anyone helping her.  She continues yelling get out at staff.  Sitter is at pt's side.  Continue 1:1 for safety.  Pt is safe at this time.

## 2017-04-19 NOTE — Progress Notes (Signed)
Recreation Therapy Notes  Date: 04/19/2017 Time: 10:00am Location: 500 Hall Dayroom  Group Topic: Leisure Education  Goal Area(s) Addresses:  Pt will successfully identify three positive leisure activities. Pt will successfully identify benefits of participating in leisure activities.  Intervention: Game  Activity: Pts will work together to identify leisure activities that begin with each letter of the alphabet.  Education: Leisure Education, Discharge Planning  Education Outcome: Acknowledges understanding   Clinical Observations/Feedback: Pt did not attend group.  Rachel Meyer, Recreation Therapy Intern  Djon Tith, LRT/CTRS 

## 2017-04-19 NOTE — Progress Notes (Signed)
1:1 progress note:  Pt has been lying in bed most of the evening, but got up after 2200 and sat in the dayroom for a little while.  She started to become more anxious shortly before 2300.  She began pacing and trying to open doors on the hall.  She went to the double doors that lead to the main nurse's station and began knocking on the door.  Writer tried to tell her that the staff who were on the other hall would not open the door because she was admitted to this hall.  She is irritable and told Clinical research associatewriter and hall staff "shut up".  Writer cautiously approached her to try to engage her in conversation.  At first she kept saying "repent" repeatedly.  Writer was eventually able to engage her about school and what she was studying.  For about 10 minutes she carried on a clear and meaningful conversation with Clinical research associatewriter.  Then it seemed that she became preoccupied again, and  walked to her room.  She went sat down on the edge of the bed mumbling to herself.  Pt continues to need 1:1 for her safety d/t psychosis.  Support and encouragement offered.  Sitter with patient.  Pt safe at this time.

## 2017-04-19 NOTE — Progress Notes (Signed)
At this time, pt is resting in bed with eyes closed.  No distress observed.  Respirations even and unlabored.  Continue 1:1 for safety.  Sitter at bedside.  Pt safe at this time.

## 2017-04-19 NOTE — Tx Team (Signed)
Interdisciplinary Treatment and Diagnostic Plan Update  04/19/2017 Time of Session: 1:39 PM  Lindsey Richmond MRN: [id number]  Principal Diagnosis: <principal problem not specified>  Secondary Diagnoses: Active Problems:   Acute psychosis   Current Medications:  Current Facility-Administered Medications  Medication Dose Route Frequency Provider Last Rate Last Dose  . acetaminophen (TYLENOL) tablet 650 mg  650 mg Oral Q6H PRN [name], NP      . alum & mag hydroxide-simeth (MAALOX/MYLANTA) 200-200-20 MG/5ML suspension 30 mL  30 mL Oral Q4H PRN [name], NP      . ibuprofen (ADVIL,MOTRIN) tablet 600 mg  600 mg Oral Q6H PRN [name], NP   600 mg at 04/18/17 2105  . LORazepam (ATIVAN) tablet 1 mg  1 mg Oral Q6H PRN [name], NP   1 mg at 04/18/17 2105  . magnesium hydroxide (MILK OF MAGNESIA) suspension 30 mL  30 mL Oral Daily PRN [name], NP      . ondansetron (ZOFRAN-ODT) disintegrating tablet 4 mg  4 mg Oral Q8H PRN [name], NP      . risperiDONE (RISPERDAL M-TABS) disintegrating tablet 1 mg  1 mg Oral BID Laverle Hobby, PA-C   1 mg at 04/19/17 0370    PTA Medications: Prescriptions Prior to Admission  Medication Sig Dispense Refill Last Dose  . minocycline (MINOCIN,DYNACIN) 100 MG capsule Take 100 mg by mouth 2 (two) times daily.   Past Week at Unknown time    Patient Stressors: Other: psychosis  Patient Strengths: Physical Health Supportive family/friends  Treatment Modalities: Medication Management, Group therapy, Case management,  1 to 1 session with clinician, Psychoeducation, Recreational therapy.   Physician Treatment Plan for Primary Diagnosis: <principal problem not specified> Long Term Goal(s): Improvement in symptoms so as ready for discharge  Short Term Goals:    Medication Management: Evaluate patient's response, side effects, and tolerance of medication regimen.  Therapeutic Interventions: 1 to 1 sessions, Unit Group  sessions and Medication administration.  Evaluation of Outcomes: Progressing  Physician Treatment Plan for Secondary Diagnosis: Active Problems:   Acute psychosis   Long Term Goal(s): Improvement in symptoms so as ready for discharge  Short Term Goals:    Medication Management: Evaluate patient's response, side effects, and tolerance of medication regimen.  Therapeutic Interventions: 1 to 1 sessions, Unit Group sessions and Medication administration.  Evaluation of Outcomes: Progressing   RN Treatment Plan for Primary Diagnosis: <principal problem not specified> Long Term Goal(s): Knowledge of disease and therapeutic regimen to maintain health will improve  Short Term Goals: Ability to identify and develop effective coping behaviors will improve and Compliance with prescribed medications will improve  Medication Management: RN will administer medications as ordered by provider, will assess and evaluate patient's response and provide education to patient for prescribed medication. RN will report any adverse and/or side effects to prescribing provider.  Therapeutic Interventions: 1 on 1 counseling sessions, Psychoeducation, Medication administration, Evaluate responses to treatment, Monitor vital signs and CBGs as ordered, Perform/monitor CIWA, COWS, AIMS and Fall Risk screenings as ordered, Perform wound care treatments as ordered.  Evaluation of Outcomes: Progressing   Recreational Therapy Treatment Plan for Primary Diagnosis: <principal problem not specified> Long Term Goal(s): Patient will participate in recreation therapy treatment in at least 2 group sessions without prompting from LRT  Short Term Goals: Patient will demonstrate increased ability to follow instructions, as demonstrated by ability to follow LRT instructions on first prompt during recreation therapy group  sessions  Treatment Modalities: Group and Pet Therapy  Therapeutic Interventions:  Psychoeducation  Evaluation of Outcomes: Progressing   LCSW Treatment Plan for Primary Diagnosis: <principal problem not specified> Long Term Goal(s): Safe transition to appropriate next level of care at discharge, Engage patient in therapeutic group addressing interpersonal concerns.  Short Term Goals: Engage patient in aftercare planning with referrals and resources  Therapeutic Interventions: Assess for all discharge needs, 1 to 1 time with Social worker, Explore available resources and support systems, Assess for adequacy in community support network, Educate family and significant other(s) on suicide prevention, Complete Psychosocial Assessment, Interpersonal group therapy.  Evaluation of Outcomes: Not Met  Pt unable to engage in meaningful conversation about dispositional plan   Progress in Treatment: Attending groups: No Participating in groups:No Taking medication as prescribed: Yes Toleration medication: Yes, no side effects reported at this time Family/Significant other contact made: No Patient understands diagnosis: No Limited insight Discussing patient identified problems/goals with staff: Yes Medical problems stabilized or resolved: Yes Denies suicidal/homicidal ideation: Yes Issues/concerns per patient self-inventory: None Other: N/A  New problem(s) identified: None identified at this time.   New Short Term/Long Term Goal(s): None identified at this time.   Discharge Plan or Barriers:   Reason for Continuation of Hospitalization: Disorganization Mood instability Bizarre behavior Medication stabilization   Estimated Length of Stay: 3-5 days  Attendees: Patient: 04/19/2017  1:39 PM  Physician: , Ambrose Finland, MD 04/19/2017  1:39 PM  Nursing: Elesa Massed RN 04/19/2017  1:39 PM  RN Care Manager: Lars Pinks, RN 04/19/2017  1:39 PM  Social Worker: Ripley Fraise 04/19/2017  1:39 PM  Recreational Therapist: Victorino Sparrow, LRT/CTRS 04/19/2017  1:39 PM   Other: Norberto Sorenson 04/19/2017  1:39 PM  Other: Donovan Kail, Recreational Therapy Intern 04/19/2017  1:39 PM    Scribe for Treatment Team:  Roque Lias LCSW 04/19/2017 1:39 PM

## 2017-04-19 NOTE — Progress Notes (Signed)
Recreation Therapy Notes  INPATIENT RECREATION THERAPY ASSESSMENT  Patient Details Name: Kenn FileMaiya Zelman MRN: [id number] DOB: 06-04-1996 Today's Date: 04/19/2017  Patient Stressors: Family  Pt stated she was here for professing Jesus and God naked.  Coping Skills:   Isolate, Substance Abuse, Exercise, Art/Dance, Sports  Personal Challenges: Communication, Concentration, Relationships, School Performance, Self-Esteem/Confidence, Restaurant manager, fast foodocial Interaction, Stress Management, Time Management  Leisure Interests (2+):  Sports - Golf, Exercise - Running, Individual - Other (Comment), Sports - Other (Comment) (Watch anime, bowling)  Awareness of Community Resources:  Yes  Community Resources:  Park, [facility], [place]  Current Use: Yes  Patient Strengths:  Beliefs, loyal person  Patient Identified Areas of Improvement:  Communication, finances  Current Recreation Participation:  4 times a week  Patient Goal for Hospitalization:  "to spread the message, talk about heaven and hell".  [place]  County of Residence:  [place]  Current [place] (including self-harm):  No  Current HI:  Yes (Pt rated 9 out of 10; reported to nurse)  Consent to Intern Participation: N/A  Caroll RancherMarjette Kabrea Seeney, LRT/CTRS  Lillia AbedLindsay, Nicolae Vasek A 04/19/2017, 12:06 PM

## 2017-04-19 NOTE — [facility] Suicide Risk Assessment (Signed)
[facility]   Nursing information obtained from:  Other (Comment) (Unable to assess) Demographic factors:  Adolescent or young adult Current Mental Status:   (Unable to assess) Loss Factors:   (Unable to assess) Historical Factors:   (Unable to assess) Risk Reduction Factors:   (Unable to assess)  Total Time spent with patient: 45 minutes Principal Problem: <principal problem not specified> Diagnosis:   Patient Active Problem List   Diagnosis Date Noted  . Acute psychosis [F23] 04/18/2017   Subjective Data: [name] is 21 years old female admitted to acute psychiatric hospitalization emergently and involuntarily for dangerous disruptive behaviors and also bizarre manic behaviors like running naked. Patient cannot contract for safety and needed one-to-one observation for agitation and disruption of milieu therapy.  Continued Clinical Symptoms:    The "Alcohol Use Disorders Identification Test", Guidelines for Use in Primary Care, Second Edition.  World Science writerHealth Organization [facility](WHO). Score between 0-7:  no or low risk or alcohol related problems. Score between 8-15:  moderate risk of alcohol related problems. Score between 16-19:  high risk of alcohol related problems. Score 20 or above:  warrants further diagnostic evaluation for alcohol dependence and treatment.   CLINICAL FACTORS:   Severe Anxiety and/or Agitation Bipolar Disorder:   Mixed State Unstable or Poor Therapeutic Relationship   Psychiatric Specialty Exam: Physical Exam  ROS  Blood pressure 120/67, pulse (!) 117, temperature 98.3 F (36.8 C), resp. rate 16, height 5\' 4"  (1.626 m), weight 80.7 kg (178 lb).Body mass index is 30.55 kg/m.     COGNITIVE FEATURES THAT CONTRIBUTE TO RISK:  Closed-mindedness, Loss of executive function, Polarized thinking and Thought constriction (tunnel vision)    SUICIDE RISK:   Moderate:  Frequent suicidal ideation with limited intensity, and duration,  some specificity in terms of plans, no associated intent, good self-control, limited dysphoria/symptomatology, some risk factors present, and identifiable protective factors, including available and accessible social support.  PLAN OF CARE: Admit involuntarily under emergently for acute symptoms of depression and mania and bizarre behaviors unable to protect herself. Patient required chemical restraints secondary to increased agitation and aggressive behavior during the emergency department presentation. Patient need crisis stabilization, safety monitoring and medication management during this hospitalization.  I certify that inpatient services furnished can reasonably be expected to improve the patient's condition.   Lindsey MouseJANARDHANA Kimbery Harwood, MD 04/19/2017, 9:53 AM

## 2017-04-19 NOTE — [facility] Counselor (Signed)
Pt psychotic; labile; unable to participate in Psychosocial assessment--contact attempt made with only contact in patient chart--Diane Samuella Cotaliab (possibly her mother) [phone]. No answer and no voicemail. CSW will continue to attempt contact in order to obtain collateral information and complete assessment.   Trula SladeHeather Smart, MSW, LCSW Clinical Social Worker 04/19/2017 12:48 PM

## 2017-04-19 NOTE — [facility] Counselor (Signed)
Adult Comprehensive Assessment  Patient ID: [name], female   DOB: March 13, 1996, 21 y.o.   MRN: [id number]  Information Source: Information source:  (Patient unable to complete assessment due to severity of her psychosis. 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  What is atmosphere in current home: Comfortable, Loving, Supportive  Family History:  Marital status: Single Are you sexually active?: Yes What is your sexual orientation?: heterosexual Has your sexual activity been affected by drugs, alcohol, medication, or emotional stress?: n/a  Does patient have children?: No  Childhood History:  By whom was/is the patient raised?: Both parents Additional childhood history information[place]  Description of patient's relationship with caregiver when they were a child: close to both parents; mother [place]; father from [place];  Patient's description of current relationship with people who raised him/her: close to both parents.  How were you disciplined when you got in trouble as a child/adolescent?: n/a  Does patient have siblings?: Yes Number of Siblings:  1 Description of patient's current relationship with siblings: close to her brother  Did patient suffer any verbal/emotional/physical/sexual abuse as a child?: No Did patient suffer from severe childhood neglect?: No Has patient ever been sexually abused/assaulted/raped as an adolescent or adult?: No Was the patient ever a victim of a crime or a disaster?: No Witnessed domestic violence?: No Has patient been effected by domestic violence as an adult?: No  Education:  Highest grade of school patient has completed: 1 yr college at [employer] [place] sophmore Currently a student?: No Learning disability?: No (per her father, pt is having issues at school with poor grades due to lower functioning and lack of concentration.)  Employment/Work Situation:   Employment situation: Surveyor, mineralstudent Patient's job has been impacted by current illness: No What is the longest time patient has a held a job?: never worked  Where was the patient employed at that time?: never worked  Has patient ever been in the Eli Lilly and Companymilitary?: No Has patient ever served in Buyer, retailcombat?: No Did You Receive Any Psychiatric Treatment/Services While in Equities traderthe Military?: No Are There Guns or Education officer, communityther Weapons in Your Home?: No Are These ComptrollerWeapons Safely Secured?:  (n/a)  Financial Resources:   Surveyor, quantityinancial resources: Medicaid, Support from parents / caregiver Does patient have a Lawyerrepresentative payee or guardian?: No  Alcohol/Substance Abuse:   What has been your use of drugs/alcohol within the last 12 months?: none  If attempted suicide, did drugs/alcohol play a role in this?: No Alcohol/Substance Abuse Treatment Hx: Denies past history If yes, describe treatment: [facility]" for psychotic symptoms.  Has alcohol/substance abuse ever caused legal problems?: No  Social Support System:   Patient's Community Support System: Fair Describe Community Support System: family; not many  friends Type  of faith/religion: christian-father reports hyper religous behavior How does patient's faith help to cope with current illness?: n/a   Leisure/Recreation:   Leisure and Hobbies: reading and jogging  Strengths/Needs:   What things does the patient do well?: "she's a smart girl and when she is mentally well, she does well in school." In what areas does patient struggle / problems for patient: lacking insight; psychotic;   Discharge Plan:   Does patient have access to transportation?: Yes (she borrows a parents' car when she needs to get around) Will patient be returning to same living situation after discharge?: Yes (home with parents) Currently receiving community mental health services: Yes (From Whom) ([facility]pt's father may be wanting her to see a different provider.) If no, would patient like referral for services when discharged?: Yes (What county?) [place]) Does patient have financial barriers related to discharge medications?: No  Summary/Recommendations:   Summary and Recommendations (to be completed by the evaluator): Patient is 20yo female living in [place], [place] with her father, mother, and 52 yo brother. Patient presents to the hospital involuntarily after being found walking down the street without clothes and a sign that said "heaven or hell." Per collateral information, patient has been struggling with bizarre behaviors, difficulty concentrating, possible AVH and delusions, and has been struggling in college due to decreased functioning. Patient has no legal charges and was negative for all substances. Recommendations for patient include: crisis stabilization, therapeutic milieu ,encourage group attendance and participation, medication management for elimination of psychotic symptoms and mood stabilization. CSW assessing for appropriate referrals.   Ledell Peoples Smart LCSW 04/19/2017 3:38 PM

## 2017-04-19 NOTE — Progress Notes (Signed)
Patient did not attend karaoke. 

## 2017-04-19 NOTE — Plan of Care (Signed)
Problem: Safety: Goal: Periods of time without injury will increase Outcome: Progressing 1:1 observation maintained without self harm gestures thus far. Assigned staff in attendance at all times.

## 2017-04-19 NOTE — H&P (Signed)
Psychiatric Admission Assessment Adult  Patient Identification: Lindsey Richmond MRN:  [id number] Date of Evaluation:  04/19/2017 Chief Complaint:  UNSPECIFIED SCHIZOPHRENIA SPECTRUM AND OTHER PSYCHOTIC DISORDER Principal Diagnosis: <principal problem not specified> Diagnosis:   Patient Active Problem List   Diagnosis Date Noted  . Acute psychosis [F23] 04/18/2017   History of Present Illness: Lindsey Richmond is 21 years old female, college student admitted involuntarily under emergently from the emergency department for treatment of acute psychosis. Patient was found in the emergency department rocking back-and-forth and making punching movements with her hands. Patient was found hypotensive religious, bizarre, preoccupied and a poor historian. Reportedly patient was found walking down the street naked with a sign that read heaven or hell. Patient required chemical restraints with the Geodon 20 mg in emergency department and also Ativan to control her acute psychosis. Patient is currently placed on Risperdal M tablet is 1 mg twice daily. Patient was not able to respond verbally after waking her up with tactile stimuli she appeared stating without any responses during my evaluation. Patient urine drug screen is negative for drugs of abuse and patient blood alcohol is not significant. Most of the information obtained from available medical records from the clinician assessment, ER physician assessment and talking with the patient father on the phone.  Collateral information: Patient father reported patient has been intellectual and does well in school and reportedly had a first episode of mental illness about 2 years ago and required acute psychiatric hospitalization in [place]. Patient father was not able to give me the information about medication details. Patient has no known substance abuse or alcohol abuse versus dependence. Reportedly she has no history of victimization. Patient father reported  patient walked away from home about 10 days ago before somebody from the [facility] called him, and he was happy that she has been under good care at this time. He also reported patient has a significant family history of mental illness in her grandmother.  As per ER physician: Pt brought in to the ER with cc of psychoses. Pt is Nada Libman. Per EMS pt was found in the streets [place], naked and holding a sign HEAVEN / HELL. Pt was calling for Jesus, but was calm, as soon as she arrived to the ER she became aggressive and started yelling. Pt doesn't give me much history. She tells me that "Jesus loves her". She tells me her name is Lindsey Richmond.  As per CSW:  Patient is 21yo female living in [place], [place] ([place] with her father, mother, and 71 yo brother. Patient presents to the hospital involuntarily after being found walking down the street without clothes and a sign that said "heaven or hell." Per collateral information, patient has been struggling with bizarre behaviors, difficulty concentrating, possible AVH and delusions, and has been struggling in college due to decreased functioning. Patient has no legal charges and was negative for all substances. Recommendations for patient include: crisis stabilization, therapeutic milieu ,encourage group attendance and participation, medication management for elimination of psychotic symptoms and mood stabilization. CSW assessing for appropriate referrals   Associated Signs/Symptoms: Depression Symptoms:  psychomotor agitation, difficulty concentrating, (Hypo) Manic Symptoms:  Delusions, Distractibility, Elevated Mood, Flight of Ideas, Grandiosity, Impulsivity, Irritable Mood, Labiality of Mood, Anxiety Symptoms:  none reported Psychotic Symptoms:  Delusions, Ideas of Reference, Paranoia, PTSD Symptoms: NA Total Time spent with patient: 30 minutes  Past Psychiatric History: Has history of mental illness and being  admitted to [facility] in [employer]  about a year ago and also suppose to follow up at [facility].  Is the patient at risk to self? Yes.    Has the patient been a risk to self in the past 6 months? No.  Has the patient been a risk to self within the distant past? No.  Is the patient a risk to others? No.  Has the patient been a risk to others in the past 6 months? No.  Has the patient been a risk to others within the distant past? No.   Prior Inpatient Therapy:   Prior Outpatient Therapy:    Alcohol Screening: Patient refused Alcohol Screening Tool: Yes Substance Abuse History in the last 12 months:  No. Consequences of Substance Abuse: NA Previous Psychotropic Medications: Yes  Psychological Evaluations: Yes  Past Medical History: History reviewed. No pertinent past medical history. History reviewed. No pertinent surgical history. Family History: History reviewed. No pertinent family history. Family Psychiatric  History: Significant for depression and other mental illness in grandma and aunt and being admitted to mental health hospital. Tobacco Screening: Have you used any form of tobacco in the last 30 days? (Cigarettes, Smokeless Tobacco, Cigars, and/or Pipes): Patient Refused Screening Social History:  History  Alcohol use Not on file     History  Drug use: Unknown    Additional Social History:                           Allergies:  No Known Allergies Lab Results:  Results for orders placed or performed during the hospital encounter of 04/17/17 (from the past 48 hour(s))  Comprehensive metabolic panel     Status: Abnormal   Collection Time: 04/17/17  1:44 PM  Result Value Ref Range   Sodium 143 135 - 145 mmol/L   Potassium 3.6 3.5 - 5.1 mmol/L   Chloride 106 101 - 111 mmol/L   CO2 26 22 - 32 mmol/L   Glucose, Bld 89 65 - 99 mg/dL   BUN 18 6 - 20 mg/dL   Creatinine, Ser 1.02 (H) 0.44 - 1.00 mg/dL   Calcium 9.6 8.9 - 10.3 mg/dL   Total Protein 9.2 (H) 6.5 - 8.1 g/dL    Albumin 4.5 3.5 - 5.0 g/dL   AST 25 15 - 41 U/L   ALT 18 14 - 54 U/L   Alkaline Phosphatase 67 38 - 126 U/L   Total Bilirubin 0.8 0.3 - 1.2 mg/dL   GFR calc non Af Amer >60 >60 mL/min   GFR calc Af Amer >60 >60 mL/min    Comment: (NOTE) The eGFR has been calculated using the CKD EPI equation. This calculation has not been validated in all clinical situations. eGFR's persistently <60 mL/min signify possible Chronic Kidney Disease.    Anion gap 11 5 - 15  CBC with Diff     Status: None   Collection Time: 04/17/17  1:44 PM  Result Value Ref Range   WBC 7.9 4.0 - 10.5 K/uL   RBC 4.20 3.87 - 5.11 MIL/uL   Hemoglobin 12.9 12.0 - 15.0 g/dL   HCT 37.6 36.0 - 46.0 %   MCV 89.5 78.0 - 100.0 fL   MCH 30.7 26.0 - 34.0 pg   MCHC 34.3 30.0 - 36.0 g/dL   RDW 13.0 11.5 - 15.5 %   Platelets 252 150 - 400 K/uL   Neutrophils Relative % 69 %   Neutro Abs 5.5 1.7 - 7.7 K/uL   Lymphocytes Relative  24 %   Lymphs Abs 1.9 0.7 - 4.0 K/uL   Monocytes Relative 7 %   Monocytes Absolute 0.6 0.1 - 1.0 K/uL   Eosinophils Relative 0 %   Eosinophils Absolute 0.0 0.0 - 0.7 K/uL   Basophils Relative 0 %   Basophils Absolute 0.0 0.0 - 0.1 K/uL  Acetaminophen level     Status: Abnormal   Collection Time: 04/17/17  1:44 PM  Result Value Ref Range   Acetaminophen (Tylenol), Serum <10 (L) 10 - 30 ug/mL    Comment:        THERAPEUTIC CONCENTRATIONS VARY SIGNIFICANTLY. A RANGE OF 10-30 ug/mL MAY BE AN EFFECTIVE CONCENTRATION FOR MANY PATIENTS. HOWEVER, SOME ARE BEST TREATED AT CONCENTRATIONS OUTSIDE THIS RANGE. ACETAMINOPHEN CONCENTRATIONS >150 ug/mL AT 4 HOURS AFTER INGESTION AND >50 ug/mL AT 12 HOURS AFTER INGESTION ARE OFTEN ASSOCIATED WITH TOXIC REACTIONS.   Ethanol     Status: None   Collection Time: 04/17/17  1:44 PM  Result Value Ref Range   Alcohol, Ethyl (B) <5 <5 mg/dL    Comment:        LOWEST DETECTABLE LIMIT FOR SERUM ALCOHOL IS 5 mg/dL FOR MEDICAL PURPOSES ONLY   Salicylate  level     Status: None   Collection Time: 04/17/17  1:44 PM  Result Value Ref Range   Salicylate Lvl <2.6 2.8 - 30.0 mg/dL  I-Stat Beta hCG blood, ED ([facility], [facility], [facility] only)     Status: None   Collection Time: 04/17/17  1:51 PM  Result Value Ref Range   I-stat hCG, quantitative <5.0 <5 mIU/mL   Comment 3            Comment:   GEST. AGE      CONC.  (mIU/mL)   <=1 WEEK        5 - 50     2 WEEKS       50 - 500     3 WEEKS       100 - 10,000     4 WEEKS     1,000 - 30,000        FEMALE AND NON-PREGNANT FEMALE:     LESS THAN 5 mIU/mL   Urine rapid drug screen (hosp performed)     Status: None   Collection Time: 04/17/17  9:16 PM  Result Value Ref Range   Opiates NONE DETECTED NONE DETECTED   Cocaine NONE DETECTED NONE DETECTED   Benzodiazepines NONE DETECTED NONE DETECTED   Amphetamines NONE DETECTED NONE DETECTED   Tetrahydrocannabinol NONE DETECTED NONE DETECTED   Barbiturates NONE DETECTED NONE DETECTED    Comment:        DRUG SCREEN FOR MEDICAL PURPOSES ONLY.  IF CONFIRMATION IS NEEDED FOR ANY PURPOSE, NOTIFY LAB WITHIN 5 DAYS.        LOWEST DETECTABLE LIMITS FOR URINE DRUG SCREEN Drug Class       Cutoff (ng/mL) Amphetamine      1000 Barbiturate      200 Benzodiazepine   333 Tricyclics       545 Opiates          300 Cocaine          300 THC              50     Blood Alcohol level:  Lab Results  Component Value Date   ETH <5 62/56/3893    Metabolic Disorder Labs:  No results found for: HGBA1C, MPG No results found for:  PROLACTIN No results found for: CHOL, TRIG, HDL, CHOLHDL, VLDL, LDLCALC  Current Medications: Current Facility-Administered Medications  Medication Dose Route Frequency Provider Last Rate Last Dose  . acetaminophen (TYLENOL) tablet 650 mg  650 mg Oral Q6H PRN [name], NP      . alum & mag hydroxide-simeth (MAALOX/MYLANTA) 200-200-20 MG/5ML suspension 30 mL  30 mL Oral Q4H PRN [name], NP      . ibuprofen (ADVIL,MOTRIN) tablet 600 mg   600 mg Oral Q6H PRN [name], NP   600 mg at 04/18/17 2105  . LORazepam (ATIVAN) tablet 1 mg  1 mg Oral Q6H PRN [name], NP   1 mg at 04/18/17 2105  . magnesium hydroxide (MILK OF MAGNESIA) suspension 30 mL  30 mL Oral Daily PRN [name], NP      . ondansetron (ZOFRAN-ODT) disintegrating tablet 4 mg  4 mg Oral Q8H PRN [name], NP      . risperiDONE (RISPERDAL M-TABS) disintegrating tablet 1 mg  1 mg Oral BID Laverle Hobby, PA-C   1 mg at 04/19/17 1287   PTA Medications: Prescriptions Prior to Admission  Medication Sig Dispense Refill Last Dose  . minocycline (MINOCIN,DYNACIN) 100 MG capsule Take 100 mg by mouth 2 (two) times daily.   Past Week at Unknown time    Musculoskeletal: Strength & Muscle Tone: within normal limits Gait & Station: unable to stand Patient leans: N/A  Psychiatric Specialty Exam: Physical Exam Full physical performed in Emergency Department. I have reviewed this assessment and concur with its findings.   ROS unable to complete due to mental health status.   Blood pressure 120/67, pulse (!) 117, temperature 98.3 F (36.8 C), resp. rate 16, height 5' 4"  (1.626 m), weight 80.7 kg (178 lb).Body mass index is 30.55 kg/m.  General Appearance: Guarded  Eye Contact:  Minimal  Speech:  Blocked and Slurred  Volume:  Decreased  Mood:  Depressed  Affect:  Inappropriate and Labile  Thought Process:  Disorganized  Orientation:  Full (Time, Place, and Person)  Thought Content:  Paranoid Ideation and Rumination  Suicidal Thoughts:  No  Homicidal Thoughts:  No  Memory:  Immediate;   Fair Recent;   Fair Remote;   Fair  Judgement:  Poor  Insight:  Fair  Psychomotor Activity:  Psychomotor Retardation  Concentration:  Concentration: Poor and Attention Span: Poor  Recall:  [place] of Knowledge:  NA  Language:  Good  Akathisia:  NA  Handed:  Right  AIMS (if indicated):     Assets:  Communication Skills Desire for  Improvement Financial Resources/Insurance Housing Leisure Time Physical Health Resilience Social Support Talents/Skills Transportation Vocational/Educational  ADL's:  Impaired  Cognition:  Impaired,  Mild  Sleep:       Treatment Plan Summary: Daily contact with patient to assess and evaluate symptoms and progress in treatment and Medication management  Observation Level/Precautions:  1 to 1  Laboratory:  Reviewed admission labs, will check prolactin, lipid panel and hemoglobin A1c   Psychotherapy:  group  Medications:  Risperidal M-tab 1 mg PO BID for psychosis  Consultations:  As needed  Discharge Concerns:  Safety and psychosis  Estimated LOS: 7 days  Other:     Physician Treatment Plan for Primary Diagnosis: <principal problem not specified> Long Term Goal(s): Improvement in symptoms so as ready for discharge  Short Term Goals: Ability to identify changes in lifestyle to reduce recurrence of condition will improve,  Ability to verbalize feelings will improve, Ability to disclose and discuss suicidal ideas and Ability to demonstrate self-control will improve  Physician Treatment Plan for Secondary Diagnosis: Active Problems:   Acute psychosis  Long Term Goal(s): Improvement in symptoms so as ready for discharge  Short Term Goals: Ability to identify and develop effective coping behaviors will improve, Ability to maintain clinical measurements within normal limits will improve, Compliance with prescribed medications will improve and Ability to identify triggers associated with substance abuse/mental health issues will improve  I certify that inpatient services furnished can reasonably be expected to improve the patient's condition.    Ambrose Finland, MD 7/19/20189:53 AM

## 2017-04-19 NOTE — Progress Notes (Signed)
Pt was placed on 1:1 d/t her agitation and disruption of the milieu.  See note in shadow chart of 1:1 progress notes.  Pt's psychosis has prevented staff from being able to engage with patient this evening.  She is religiously preoccupied and not able to engage in conversation.  Pt was given IM medications for her agitation.  Pt took the meds without incident although a show of force was available if needed.  Support offered to patient.  Pt is safe at this time.

## 2017-04-19 NOTE — Progress Notes (Signed)
D: Pt A & O to self, time and place. Presents with blunted affect and labile mood. Appears preoccupied during interactions. Observed whispering to self and laughing /smiling inappropriately during assessment. Pt denies SI, AVH and pain. Endorsed SI towards others including staff, however, pt has not made any aggressive gesture towards staff thus far. Verbally contracts for safety. Pt began raising her voice at staff in her room " Get out of my room, repent, bitch, repent". Pt is irritable but redirectable at this time.  A: Support and availability provided to pt as needed. Encouraged pt to voice concerns and comply with medications. 1:1 observation continues with assigned staff in attendance at all times.  R: Tolerates breakfast and snacks well. Remains safe on unit. POC continues for mood stability and safety.

## 2017-04-19 NOTE — [facility] Group Notes (Signed)
[facility]  04/19/2017 , 1:23 PM    Type of Therapy:  [facility] [employer] Presentation  Participation Level:  Active  Participation Quality:  Attentive  Affect:  Blunted  Cognitive:  Oriented  Insight:  Limited  Engagement in Therapy:  Engaged  Modes of Intervention:  Discussion, Education and Socialization  Summary of Progress/Problems:  Onalee HuaDavid from [employer] came to present his recovery story and play the guitar.  Invited.  Chose to not attend.  Daryel Geraldorth, Raihan Kimmel B 04/19/2017 , 1:23 PM

## 2017-04-20 DIAGNOSIS — F200 Paranoid schizophrenia: Secondary | ICD-10-CM | POA: Diagnosis present

## 2017-04-20 DIAGNOSIS — Z560 Unemployment, unspecified: Secondary | ICD-10-CM

## 2017-04-20 LAB — GLUCOSE, CAPILLARY: Glucose-Capillary: 82 mg/dL (ref 65–99)

## 2017-04-20 MED ORDER — BENZTROPINE MESYLATE 0.5 MG PO TABS: 0.5000 mg | ORAL_TABLET | Freq: Every day | ORAL | Status: DC | PRN

## 2017-04-20 MED ORDER — RISPERIDONE 1 MG PO TBDP
1.0000 mg | ORAL_TABLET | Freq: Every day | ORAL | Status: DC
  Administered 2017-04-21: 1 mg via ORAL
  Filled 2017-04-20 (×3): qty 1

## 2017-04-20 MED ORDER — RISPERIDONE 2 MG PO TBDP
2.0000 mg | ORAL_TABLET | Freq: Every day | ORAL | Status: DC
  Administered 2017-04-20: 2 mg via ORAL
  Filled 2017-04-20 (×3): qty 1
  Filled 2017-04-20: qty 2

## 2017-04-20 NOTE — Progress Notes (Signed)
Recreation Therapy Notes  Date: 04/20/2017 Time: 10:00am Location: [address] Dayroom  Group Topic: Communication and Team-Building  Goal Area(s) Addresses:  Pts will successfully communicate with team members. Pts will successfully work with team members.  Intervention: STEM Activity  Activity: Pts were split into two teams of three. Each team received 20 plastic drinking straws and approximately 24 inches of masking tape. Pts were instructed to build a free-standing bridge with the material they were given that could hold a hardcover book.  Education: Communication, Estate agentTeam-Building, Discharge Planning  Education Outcome: Acknowledges understanding  Clinical Observations/Feedback: : Pt did not attend group.  Marvell Fullerachel Meyer, Recreation Therapy Intern  Caroll RancherMarjette Davon Abdelaziz, LRT/CTRS

## 2017-04-20 NOTE — Progress Notes (Signed)
Patient is currently laying on the bed.  Patient continues to state there is demons in my room.  Patient has been aggressive toward staff throwing coffee on the staff.  Patient is being monitored 1:1 with staff in close proximity continue to monitor.

## 2017-04-20 NOTE — [facility] Group Notes (Signed)
[facility] LCSW Group Therapy  04/20/2017  1:05 PM  Type of Therapy:  Group therapy  Participation Level:  Active  Participation Quality:  Attentive  Affect:  Flat  Cognitive:  Oriented  Insight:  Limited  Engagement in Therapy:  Limited  Modes of Intervention:  Discussion, Socialization  Summary of Progress/Problems:  Chaplain was here to lead a group on themes of hope and courage. Invited.  In bed asleep.  Daryel Geraldorth, Marjarie Irion B 04/20/2017 1:12 PM

## 2017-04-20 NOTE — Progress Notes (Signed)
Did not attend group 

## 2017-04-20 NOTE — Progress Notes (Signed)
Patient is resting on her bed.  Patient continues with disorganized and bizarre behaviors.  Patient states "I have demons in my room, you all need to repent." Patient has been easily agitated and had to be redirected due to aggression to staff.  Patient was noted to be balling her fist up and telling 1:1 sitter to shut up and get out.  Patient has been compliant with medications.  Continue to monitor as planned.

## 2017-04-20 NOTE — Progress Notes (Signed)
1:1 progress note:  At this time, pt is lying in bed with eyes closed.  No distress observed.  Respirations even and unlabored.  Continue 1:1 for safety.  Sitter at bedside.  Pt is safe at this time.

## 2017-04-20 NOTE — Progress Notes (Signed)
1:1 note @ 2200 Next note @ 0200   D:Pt observed sitting in wheelchair facing the window while speaking to herself. Pt remains hypereligious and disorganized. Pt. denies SI/HI/AVH/Pain at this time. Medications ordered and given. Vitals Q4 post fall in effect.   A:1:1 observation continues for safety. Sitter within arms reach.  R:Pt remains safe

## 2017-04-20 NOTE — Progress Notes (Signed)
Patient is currently laying on the bed.  Patient continues to state there is demons in my room.  Patient has been verbally aggressive toward staff.  Patient is being monitored 1:1 with staff in close proximity continue to monitor.

## 2017-04-20 NOTE — Progress Notes (Signed)
Pt's agitation continued to escalate after 2300.  She was pacing in and out of her room  When staff would enter her room, she would tell them to get out.  The sitter is sitting in her room, but close to the door.  Pt, at one point, knelt beside the window and was staring out, mumbling to herself.  She then started to get loud and staff made the decision to give her medication for her agitation.  At first pt declined the medication, stating that she was fine and did not need it.  After much coaxing, writer was able to get pt to take the Ativan 1 mg and Zydis 10 mg,  Pt then lay down in the bed.  Pt continues on 1:1 observation for safety.  Sitter with patient.  Pt safe at this time.

## 2017-04-20 NOTE — Progress Notes (Signed)
Pt has been asleep since receiving the Zydis 10 mg and Ativan 1 mg at 2342 last night.  She is lying in her bed with eyes closed and no distress observed.  Respirations even and unlabored.  Sitter reports she has "stirred " a few times, but has remained asleep.  Continue 1:1 for safety.  Sitter at bedside.  Pt remains safe.

## 2017-04-20 NOTE — Progress Notes (Signed)
[facility] MD Progress Note  04/20/2017 1:30 PM Lindsey Richmond  MRN:  [id number]  Subjective: Lindsey Richmond reports, "I was prophesying the word of God. Judgement is coming, I don't when, but it is coming for everyone".  Objective: Lindsey Richmond is seen, chart reviewed. She is in her room, lying down in her bed. Disheveled with foul body odor. She is on 1:1 supervision for safety. She presents disheveled, not making any eye contact. Verbally responsive, some verbal outburst noted, telling staff to "just shut-up".  She presents with thought blocking, visibly responding to some internal stimuli. After this assessment, patient got out of her room with her 1:1 sitter standing next to her. She went to the Day room to use the phone, sitter says, patient without any warning dropped on the floor. This is a witnessed fall, no obvious injuries noted. She was assisted up from the floor by her nurse & sitter. Obtained vital signs, see flow sheet. Blood checked, 82. Report indicated that patient did not eat the breakfast served. Staff is offering her fluids. Will be provided & encouraged to eat lunch. Her Risperdal dose has been adjusted. See MAR.  Principal Problem: Schizophrenia, Paranoid. Diagnosis:   Patient Active Problem List   Diagnosis Date Noted  . Schizophrenia, paranoid (HCC) [F20.0] 04/20/2017    Priority: High  . Acute psychosis [F23] 04/18/2017   Total Time spent with patient: 25 minutes  Past Psychiatric History: Acute psychosis.  Past Medical History: History reviewed. No pertinent past medical history. History reviewed. No pertinent surgical history.  Family History: History reviewed. No pertinent family history.  Family Psychiatric  History: See H&P  Social History:  History  Alcohol use Not on file     History  Drug use: Unknown    Social History   Social History  . Marital status: Single    Spouse name: N/A  . Number of children: N/A  . Years of education: N/A   Social History Main Topics  .  Smoking status: Unknown If Ever Smoked  . Smokeless tobacco: None  . Alcohol use None  . Drug use: Unknown  . Sexual activity: Not Asked   Other Topics Concern  . None   Social History Narrative  . None   Additional Social History:   Sleep: Fair  Appetite:  Varies  Current Medications: Current Facility-Administered Medications  Medication Dose Route Frequency Provider Last Rate Last Dose  . acetaminophen (TYLENOL) tablet 650 mg  650 mg Oral Q6H PRN Charm Rings, NP      . alum & mag hydroxide-simeth (MAALOX/MYLANTA) 200-200-20 MG/5ML suspension 30 mL  30 mL Oral Q4H PRN Charm Rings, NP      . ibuprofen (ADVIL,MOTRIN) tablet 600 mg  600 mg Oral Q6H PRN Charm Rings, NP   600 mg at 04/18/17 2105  . LORazepam (ATIVAN) tablet 1 mg  1 mg Oral Q6H PRN Charm Rings, NP   1 mg at 04/20/17 1047  . OLANZapine zydis (ZYPREXA) disintegrating tablet 10 mg  10 mg Oral Q8H PRN Laveda Abbe, NP   10 mg at 04/20/17 1047   And  . LORazepam (ATIVAN) tablet 1 mg  1 mg Oral PRN Laveda Abbe, NP       And  . ziprasidone (GEODON) injection 20 mg  20 mg Intramuscular PRN Laveda Abbe, NP      . magnesium hydroxide (MILK OF MAGNESIA) suspension 30 mL  30 mL Oral Daily PRN Charm Rings, NP      .  ondansetron (ZOFRAN-ODT) disintegrating tablet 4 mg  4 mg Oral Q8H PRN Charm RingsLord, Jamison Y, NP      . risperiDONE (RISPERDAL M-TABS) disintegrating tablet 1 mg  1 mg Oral BID Kerry HoughSimon, Spencer E, PA-C   1 mg at 04/20/17 1045    Lab Results:  Results for orders placed or performed during the hospital encounter of 04/18/17 (from the past 48 hour(s))  Glucose, capillary     Status: None   Collection Time: 04/20/17 12:08 PM  Result Value Ref Range   Glucose-Capillary 82 65 - 99 mg/dL   Comment 1 Notify RN    Comment 2 Document in Chart    Blood Alcohol level:  Lab Results  Component Value Date   ETH <5 04/17/2017   Metabolic Disorder Labs: No results found for: HGBA1C,  MPG No results found for: PROLACTIN No results found for: CHOL, TRIG, HDL, CHOLHDL, VLDL, LDLCALC  Physical Findings: AIMS: Facial and Oral Movements Muscles of Facial Expression: None, normal Lips and Perioral Area: None, normal Jaw: None, normal Tongue: None, normal,Extremity Movements Upper (arms, wrists, hands, fingers): None, normal Lower (legs, knees, ankles, toes): None, normal, Trunk Movements Neck, shoulders, hips: None, normal, Overall Severity Severity of abnormal movements (highest score from questions above): None, normal Incapacitation due to abnormal movements: None, normal Patient's awareness of abnormal movements (rate only patient's report): No Awareness, Dental Status Current problems with teeth and/or dentures?: No Does patient usually wear dentures?: No  CIWA:    COWS:     Musculoskeletal: Strength & Muscle Tone: within normal limits Gait & Station: normal Patient leans: N/A  Psychiatric Specialty Exam: Physical Exam  ROS  Blood pressure 120/67, pulse (!) 117, temperature 98.3 F (36.8 C), resp. rate 16, height 5\' 4"  (1.626 m), weight 80.7 kg (178 lb).Body mass index is 30.55 kg/m.  General Appearance: Guarded  Eye Contact:  Minimal  Speech: Blocked and Slurred  Volume:  Decreased  Mood:  Depressed  Affect:  Inappropriate and Labile  Thought Process:  Disorganized  Orientation:  Full (Time, Place, and Person)  Thought Content:  Paranoid Ideation and Rumination  Suicidal Thoughts:  No  Homicidal Thoughts:  No  Memory:  Immediate;   Fair Recent;   Fair Remote;   Fair  Judgement:  Poor  Insight:  Fair  Psychomotor Activity:  Psychomotor Retardation  Concentration:  Concentration: Poor and Attention Span: Poor  Recall:  Fair  Fund of Knowledge:  NA  Language:  Good  Akathisia:  NA  Handed:  Right  AIMS (if indicated):     Assets:  Communication Skills Desire for Improvement Financial Resources/Insurance Housing Leisure Time Physical  Health Resilience Social Support Talents/Skills Transportation Vocational/Educational  ADL's:  Impaired  Cognition:  Impaired,  Mild  Sleep: 5:25       Treatment Plan Summary: Patient is currently actively hallucinating, responding to some internal stimuli.    Psychiatric: Paranoid Schizophrenia.  Medical: Will continue monitor for any symptoms & treat on a prn basis.  Psychosocial:  Unemployed  Recent end of his relationship. Will encourage group counseling attendance & participation.  PLAN: 1.04-21-17: No changes made on the current plan of care, continue current regimen as recommended.  Agitation/severe anxiety: Continue the agitation protocol already in progress on a prn basis. (See MAR).  Mood Stabilization:  Will continue Risperdal disintegrating tablet daily. Will increase the night time dose of the Risperdal to 2 mg due worsening symptoms.  2. Continue to monitor mood, behavior and interaction with peers. Continue  1:1 supervision for safety.  Armandina Stammer I, NP, PMHNP, FNP-BC 04/20/2017, 1:30 PM

## 2017-04-20 NOTE — Progress Notes (Signed)
Patient was with 1:1 staff and walked down the hall to the dayroom.  While in the dayroom patient attempted to make a call during the call patient experienced a syncopal incident and fell to the floor.  The fall was witnessed by staff and reported.  Patient assessed and found free of injury.

## 2017-04-20 NOTE — Plan of Care (Signed)
Problem: Activity: Goal: Sleeping patterns will improve Outcome: Progressing Pt. slept 5.25 hrs last night.    

## 2017-04-21 DIAGNOSIS — F23 Brief psychotic disorder: Secondary | ICD-10-CM

## 2017-04-21 DIAGNOSIS — R45850 Homicidal ideations: Secondary | ICD-10-CM

## 2017-04-21 DIAGNOSIS — F200 Paranoid schizophrenia: Principal | ICD-10-CM

## 2017-04-21 MED ORDER — DIVALPROEX SODIUM ER 500 MG PO TB24
750.0000 mg | ORAL_TABLET | Freq: Every day | ORAL | Status: DC
  Filled 2017-04-21 (×2): qty 1

## 2017-04-21 MED ORDER — RISPERIDONE 1 MG PO TBDP: 1.0000 mg | ORAL_TABLET | Freq: Two times a day (BID) | ORAL | Status: DC | PRN

## 2017-04-21 MED ORDER — LORAZEPAM 1 MG PO TABS
2.0000 mg | ORAL_TABLET | Freq: Four times a day (QID) | ORAL | Status: DC | PRN
  Administered 2017-04-21 – 2017-04-25 (×6): 2 mg via ORAL
  Filled 2017-04-21 (×6): qty 2

## 2017-04-21 MED ORDER — DIVALPROEX SODIUM ER 500 MG PO TB24
750.0000 mg | ORAL_TABLET | Freq: Every day | ORAL | Status: DC
  Administered 2017-04-21: 22:00:00 750 mg via ORAL
  Filled 2017-04-21 (×4): qty 1

## 2017-04-21 MED ORDER — RISPERIDONE 2 MG PO TBDP
3.0000 mg | ORAL_TABLET | Freq: Every day | ORAL | Status: DC
  Administered 2017-04-21 – 2017-04-24 (×4): 3 mg via ORAL
  Filled 2017-04-21 (×7): qty 1

## 2017-04-21 NOTE — Progress Notes (Signed)
D: Pt A & O to self, place and time when assessed. Presents with blunted affect and irritability /agitation and verbal agreesion on  approach. Observed with elective mutism during interactions. Denies SI, AVH and pain when assessed. Endorsed HI, "Yeah, to anybody". Remains disorganized, responds to internal stimuli aeb inappropriate laughter, smiles and mumbles. A: Scheduled medications administered as prescribed and effects monitored. Support and availability offered to pt. Verbal redirections and encouragement provided. Fall precaution continues as ordered. Wheel chair in use for assistance with mobility, no falls event to note thus far. 1:1 observation continues as ordered. Assigned staff in attendance at all times.  R: Safety maintained on unit. POC remains effective for mood stability.

## 2017-04-21 NOTE — [facility] Group Notes (Signed)
Nursing Psychoeducational group - Coping skills. Patient did not attend, despite MHT and RN prompting.  

## 2017-04-21 NOTE — Progress Notes (Signed)
1:1 note @ 0600 Next note @ 2200  D:Pt observed sitting in wheelchair with eyes open. Pt appears calm at this time. BP/HR soft this am. Re-check manually with improvement. Pt encourage to push fluids. Gatorade given. See vitals Q4 post fall sheet.  A:1:1 observation continues for safety. Sitter within arms reach.  R:Pt remains safe

## 2017-04-21 NOTE — Progress Notes (Signed)
1:1 note @ 0200 Next note @ 0600  D:Pt observed resting in bed with eyes closed. Respirations even and unlabored. No distress noted. Vitals Q4 post fall in effect.   A:1:1 observation continues for safety. Sitter within arms reach.  R:Pt remains safe

## 2017-04-21 NOTE — Progress Notes (Signed)
D: Pt continues to be tearful, agitated to respond to internal stimuli "you get out of my soul, you satan, I see you, I hear you, go, repent, you will die if you don't repent".  A: Continued support and encouragement provided to pt. 1:1 observation maintained as ordered with assigned staff in attendance at all time. Fluids encouraged and offered due to concentrated urine in commode after pt was assisted to the bathroom.  R: Pt's mood remains labile. POC continues for safety and mood stability.

## 2017-04-21 NOTE — [facility] Group Notes (Signed)
[facility] Group Notes: (Clinical Social Work)   04/21/2017      Type of Therapy:  Group Therapy   Participation Level:  Did Not Attend despite MHT prompting   Neoma Uhrich Grossman-Orr, LCSW 04/21/2017, 1:01 PM     

## 2017-04-21 NOTE — Progress Notes (Signed)
Did not attend group 

## 2017-04-21 NOTE — Progress Notes (Signed)
[facility] MD Progress Note  04/21/2017 3:48 PM [name]  MRN:  [id number] Subjective:   Per nursing report, she has been irritable, verbally threatening others, and hitting the bed.   She is a poor historian and not cooperative to the interview.  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 She asks this interview to leave the room, although she somehow answers some of questions. She denies AH, VH (sitter reports some AH). She denies SI. She reports HI towards a Comptroller. She becomes vague, giggles when she is asked any plans/intent. She reports insomnia.   Principal Problem: Acute psychosis Diagnosis:   Patient Active Problem List   Diagnosis Date Noted  . Schizophrenia, paranoid (HCC) [F20.0] 04/20/2017  . Acute psychosis [F23] 04/18/2017   Total Time spent with patient: 30 minutes  Past Psychiatric History:  See H&P  Past Medical History: History reviewed. No pertinent past medical history. History reviewed. No pertinent surgical history. Family History: History reviewed. No pertinent family history. Family Psychiatric  History: See H&P Social History:  History  Alcohol use Not on file     History  Drug use: Unknown    Social History   Social History  . Marital status: Single    Spouse name: N/A  . Number of children: N/A  . Years of education: N/A   Social History Main Topics  . Smoking status: Unknown If Ever Smoked  . Smokeless tobacco: None  . Alcohol use None  . Drug use: Unknown  . Sexual activity: Not Asked   Other Topics Concern  . None   Social History Narrative  . None   Additional Social History:                         Sleep: Poor  Appetite:  Fair  Current Medications: Current Facility-Administered Medications  Medication Dose Route Frequency Provider Last Rate Last Dose  . acetaminophen (TYLENOL) tablet 650 mg  650 mg Oral Q6H PRN Charm Rings, NP      . alum & mag hydroxide-simeth (MAALOX/MYLANTA) 200-200-20 MG/5ML suspension 30 mL  30 mL  Oral Q4H PRN Charm Rings, NP      . benztropine (COGENTIN) tablet 0.5 mg  0.5 mg Oral Daily PRN Armandina Stammer I, NP      . divalproex (DEPAKOTE ER) 24 hr tablet 750 mg  750 mg Oral Daily Zarif Rathje, MD      . ibuprofen (ADVIL,MOTRIN) tablet 600 mg  600 mg Oral Q6H PRN Charm Rings, NP   600 mg at 04/18/17 2105  . LORazepam (ATIVAN) tablet 2 mg  2 mg Oral Q6H PRN Neysa Hotter, MD      . magnesium hydroxide (MILK OF MAGNESIA) suspension 30 mL  30 mL Oral Daily PRN Charm Rings, NP      . ondansetron (ZOFRAN-ODT) disintegrating tablet 4 mg  4 mg Oral Q8H PRN Charm Rings, NP      . risperiDONE (RISPERDAL M-TABS) disintegrating tablet 1 mg  1 mg Oral BID PRN Neysa Hotter, MD      . risperiDONE (RISPERDAL M-TABS) disintegrating tablet 3 mg  3 mg Oral QHS Madaleine Simmon, Barbee Cough, MD        Lab Results:  Results for orders placed or performed during the hospital encounter of 04/18/17 (from the past 48 hour(s))  Glucose, capillary     Status: None   Collection Time: 04/20/17 12:08 PM  Result Value Ref Range   Glucose-Capillary 82 65 -  99 mg/dL   Comment 1 Notify RN    Comment 2 Document in Chart     Blood Alcohol level:  Lab Results  Component Value Date   ETH <5 04/17/2017    Metabolic Disorder Labs: No results found for: HGBA1C, MPG No results found for: PROLACTIN No results found for: CHOL, TRIG, HDL, CHOLHDL, VLDL, LDLCALC  Physical Findings: AIMS: Facial and Oral Movements Muscles of Facial Expression: None, normal Lips and Perioral Area: None, normal Jaw: None, normal Tongue: None, normal,Extremity Movements Upper (arms, wrists, hands, fingers): None, normal Lower (legs, knees, ankles, toes): None, normal, Trunk Movements Neck, shoulders, hips: None, normal, Overall Severity Severity of abnormal movements (highest score from questions above): None, normal Incapacitation due to abnormal movements: None, normal Patient's awareness of abnormal movements (rate only  patient's report): No Awareness, Dental Status Current problems with teeth and/or dentures?: No Does patient usually wear dentures?: No  CIWA:    COWS:     Musculoskeletal: Strength & Muscle Tone: within normal limits Gait & Station: unable to examine, lying in the bed Patient leans: N/A  Psychiatric Specialty Exam: Physical Exam  Review of Systems  Unable to perform ROS: Mental acuity    Blood pressure 120/67, pulse (!) 117, temperature 98.3 F (36.8 C), resp. rate 16, height 5\' 4"  (1.626 m), weight 178 lb (80.7 kg).Body mass index is 30.55 kg/m.  General Appearance: Fairly Groomed  Eye Contact:  Minimal  Speech:  loud  Volume:  Increased  Mood:  does not answer  Affect:  irritable, hostile  Thought Process:  Linear  Orientation:  Other:  unable to assess due to her irritability  Thought Content:  Rumination responding to internal stimuli  Suicidal Thoughts:  No  Homicidal Thoughts:  Yes.  without intent/plan becomes vague and does not elaborate it  Memory:  unable to assess due to her irritability  Judgement:  Impaired  Insight:  Lacking  Psychomotor Activity:  Increased  Concentration:  Concentration: Poor and Attention Span: Poor  Recall:  unable to assess due to her irritability  Fund of Knowledge:  unable to assess due to her irritability  Language:  Good  Akathisia:  No  Handed:  Right  AIMS (if indicated):   no tremors  Assets:  Physical Health  ADL's:  Intact  Cognition:  WNL  Sleep:  Number of Hours: 5.25   Assessment Lindsey Richmond is a 21 y.o. year old female, college student, who was admitted under IVC for acute psychosis. Per EMS pt was found [place] the [place], naked and holding a sign HEAVEN / HELL. She was reportedly calling for Jesus. Per chart, she had some first episode of mental illness in 2016 per collaterals.   # Bipolar I disorder # r/o schizoaffective disorder She was observed to be very irritable, hostile, and appears to  respond to internal stimuli. Her clinical course is more consistent with bipolar disorder, although will need continued evaluation.  Will uptitrate risperidone and will start Depakote as mood stabilization. Will have risperidone/ativan prn for agitation. Will continue sitter for unpredicted behavior/danger to others.   - Increase Risperdal M tab 3 mg at night - Start Depakote 750 mg at night - Start Risperdal M tab 1 mg BID prn for agitation - Start ativan 2 mg q6hprn for agitation - Obtain lab; TSH - Continue sitter for unpredicted behavior/danger to others.   Treatment Plan Summary: Daily contact with patient to assess and evaluate symptoms and progress in treatment  Neysa Hottereina Lova Urbieta, MD 04/21/2017, 3:48 PM

## 2017-04-21 NOTE — Progress Notes (Signed)
D: Pt observed in bed awake at present. Verbally abusive towards staff "you bitch, you need to repent, the end of the world is coming, satan get out of my room now, you're too ugly, you will die if you don't repent". Observed hitting her bed aggressively, yelling at staff including psychiatrist, "get out, I don't like that ugly ass bitch, get out now". Verbal redirections not effective at this time. A: PRN Zydis 10 mg administered with increased prompts / encouragement. Continued support and encouragement provided to pt as needed. 1:1 observation level maintained without self harm gestures. R: Pt continues to escalate. Threatening to hit staff "I'll beat you up bitch, get out, I don't want y'all in my room, satan out". Safety maintained on unit.

## 2017-04-22 LAB — TSH: TSH: 0.789 u[IU]/mL (ref 0.350–4.500)

## 2017-04-22 MED ORDER — DIVALPROEX SODIUM ER 500 MG PO TB24
1000.0000 mg | ORAL_TABLET | Freq: Every day | ORAL | Status: DC
  Administered 2017-04-22 – 2017-04-24 (×3): 1000 mg via ORAL
  Filled 2017-04-22 (×6): qty 2

## 2017-04-22 NOTE — Progress Notes (Signed)
1:1 Note 1830  Patient laying in bed with eyes closed.  1:1 present for safety.  Patient ate 50% of her dinner.  Respirations even and unlabored.  No signs/symptoms of pain/distress noted on patient's face/body movements.  1:1 continues per MD order for safety.

## 2017-04-22 NOTE — Progress Notes (Signed)
Nursing Note 1:1 Patient in bed with eyes closed. Respiration even and unlabored. No distress noted. Sitter within reach. Will continue to monitor patient.  Patient remains on 1:1 for safety.

## 2017-04-22 NOTE — Progress Notes (Signed)
1:1 Nursing Progress Note  D: Patient is sitting in bed eating snacks. Patient has been up in the milieu and utilizing wheelchair at times. Patient appears to have thought blocking and speaks repetitively. Patient is anxious and suspicious of staff stating, "I don't know you, I'm not supposed to take medicines from strangers". Emotional support offered. Medicines reviewed with patient. Patient educated about her orders. Patient agrees to take medications. Patient states "if Jesus wants me to". Patient reports "I am confused" and states "I don't know" three times. Environment is secured. Sitter observed with patient.  A: Patient remains on 1:1 observation per provider orders. Patient medicated as prescribed. Patient on high fall risk precautions. Snacks and fluids provided.  R: Patient in no acute distress. Patient resting in bed eating snacks comfortably. Patient denies needs for Clinical research associatewriter. Patient remains safe on the unit at this time. Will continue to monitor with 1:1 sitter, q15 min safety checks & q4 hour nursing assessments.

## 2017-04-22 NOTE — Progress Notes (Signed)
1:1 Note 1240  Patient laying in bed with eyes closed.  Respirations even and unlabored.  No signs/symptoms of pain/distress noted on patient's face/body movements.  1:1 continues for per MD order for safety.

## 2017-04-22 NOTE — Progress Notes (Signed)
Nursing Note 1:1 Patient in bed awake. Irritable when trying to talk to her. Patient yelled "Leave me alone. Just goPresenter, broadcasting" Sitter within reach. Will continue to monitor patient. Patient remains on 1:1 for safety.

## 2017-04-22 NOTE — Progress Notes (Signed)
1:1 Note 1055  Patient sitting in bed eating snack.  Respirations even and unlabored.  No signs/symptoms of pain/distress noted on patient's face/body movements.  1:1 continues per MD order.

## 2017-04-22 NOTE — Progress Notes (Signed)
1:1 Note 1555  Patient sitting in dayroom in wheelchair with 1:1 present for safety.  Patient given ativan   2 mg p.o. For anxiety/agitation. Patient given fluids.  Respirations even and unlabored.  No signs/symptoms of pain/distress noted on patient's face/body movements for safety.  Safety maintained with 1:1 per MD order.

## 2017-04-22 NOTE — Plan of Care (Signed)
Problem: Education: Goal: Emotional status will improve Outcome: Not Progressing Nurse attempted to discuss anxiety/depression/coping skills with patient.

## 2017-04-22 NOTE — Progress Notes (Signed)
[facility]  04/22/2017 11:53 AM [name]  MRN:  [id number] Subjective:   Patient seen, chart reviewed and case discussed with nursing staff. She was irritable earlier, voiced to HI to the staff, responding to internal stimuli.  She was given ativan prn. She took a shower this morning.  She states that she feels "fine." She wonders if she can be discharged. She remembers she voiced HI to the staff, although she currently denies HI. She admits she was frustrated with admission. 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 She feels depressed at times. She denies insomnia or decreased need for sleep. She denies euphoria. She denies SI. She has AH of "Jesus." She denies VH.   Principal Problem: Acute psychosis Diagnosis:   Patient Active Problem List   Diagnosis Date Noted  . Schizophrenia, paranoid (HCC) [F20.0] 04/20/2017  . Acute psychosis [F23] 04/18/2017   Total Time spent with patient: 30 minutes  Past Psychiatric History: see HPI  Past Medical History: History reviewed. No pertinent past medical history. History reviewed. No pertinent surgical history. Family History: History reviewed. No pertinent family history. Family Psychiatric  History: see HPI Social History:  History  Alcohol use Not on file     History  Drug use: Unknown    Social History   Social History  . Marital status: Single    Spouse name: N/A  . Number of children: N/A  . Years of education: N/A   Social History Main Topics  . Smoking status: Unknown If Ever Smoked  . Smokeless tobacco: None  . Alcohol use None  . Drug use: Unknown  . Sexual activity: Not Asked   Other Topics Concern  . None   Social History Narrative  . None   Additional Social History:                         Sleep: Fair  Appetite:  Good  Current Medications: Current Facility-Administered Medications  Medication Dose Route Frequency Provider Last Rate Last Dose  .  acetaminophen (TYLENOL) tablet 650 mg  650 mg Oral Q6H PRN Charm RingsLord, Jamison Y, NP      . alum & mag hydroxide-simeth (MAALOX/MYLANTA) 200-200-20 MG/5ML suspension 30 mL  30 mL Oral Q4H PRN Charm RingsLord, Jamison Y, NP      . benztropine (COGENTIN) tablet 0.5 mg  0.5 mg Oral Daily PRN Armandina StammerNwoko, Agnes I, NP      . divalproex (DEPAKOTE ER) 24 hr tablet 750 mg  750 mg Oral QHS Neysa HotterHisada, Jonathon Tan, MD   750 mg at 04/21/17 2205  . ibuprofen (ADVIL,MOTRIN) tablet 600 mg  600 mg Oral Q6H PRN Charm RingsLord, Jamison Y, NP   600 mg at 04/18/17 2105  . LORazepam (ATIVAN) tablet 2 mg  2 mg Oral Q6H PRN Neysa HotterHisada, Amirah Goerke, MD   2 mg at 04/22/17 0910  . magnesium hydroxide (MILK OF MAGNESIA) suspension 30 mL  30 mL Oral Daily PRN Charm RingsLord, Jamison Y, NP      . ondansetron (ZOFRAN-ODT) disintegrating tablet 4 mg  4 mg Oral Q8H PRN Charm RingsLord, Jamison Y, NP      . risperiDONE (RISPERDAL M-TABS) disintegrating tablet 1 mg  1 mg Oral BID PRN Neysa HotterHisada, Keerthana Vanrossum, MD      . risperiDONE (RISPERDAL M-TABS) disintegrating tablet 3 mg  3 mg Oral QHS Neysa HotterHisada, Deoni Cosey, MD   3 mg at 04/21/17 2206    Lab Results:  Results for orders placed  or performed during the hospital encounter of 04/18/17 (from the past 48 hour(s))  Glucose, capillary     Status: None   Collection Time: 04/20/17 12:08 PM  Result Value Ref Range   Glucose-Capillary 82 65 - 99 mg/dL   Comment 1 Notify RN    Comment 2 Document in Chart   TSH     Status: None   Collection Time: 04/22/17  6:31 AM  Result Value Ref Range   TSH 0.789 0.350 - 4.500 uIU/mL    Comment: Performed by a 3rd Generation assay with a functional sensitivity of <=0.01 uIU/mL. Performed at [facility], [address] [place], [place] [address]     Blood Alcohol level:  Lab Results  Component Value Date   ETH <5 04/17/2017    Metabolic Disorder Labs: No results found for: HGBA1C, MPG No results found for: PROLACTIN No results found for: CHOL, TRIG, HDL, CHOLHDL, VLDL, LDLCALC  Physical Findings: AIMS:  Facial and Oral Movements Muscles of Facial Expression: None, normal Lips and Perioral Area: None, normal Jaw: None, normal Tongue: None, normal,Extremity Movements Upper (arms, wrists, hands, fingers): None, normal Lower (legs, knees, ankles, toes): None, normal, Trunk Movements Neck, shoulders, hips: None, normal, Overall Severity Severity of abnormal movements (highest score from questions above): None, normal Incapacitation due to abnormal movements: None, normal Patient's awareness of abnormal movements (rate only patient's report): No Awareness, Dental Status Current problems with teeth and/or dentures?: No Does patient usually wear dentures?: No  CIWA:  CIWA-Ar Total: 2 COWS:     Musculoskeletal: Strength & Muscle Tone: within normal limits Gait & Station: normal Patient leans: N/A  Psychiatric Specialty Exam: Physical Exam  Review of Systems  Psychiatric/Behavioral: Positive for depression and hallucinations. Negative for substance abuse and suicidal ideas. The patient is not nervous/anxious and does not have insomnia.   All other systems reviewed and are negative.   Blood pressure 124/73, pulse (!) 106, temperature 99.1 F (37.3 C), resp. rate 16, height 5\' 4"  (1.626 m), weight 178 lb (80.7 kg).Body mass index is 30.55 kg/m.  General Appearance: Fairly Groomed  Eye Contact:  Fair  Speech:  mumbles  Volume:  Normal  Mood:  "fine"  Affect:  Blunt  Thought Process:  Coherent  Orientation:  Full (Time, Place, and Person)  Thought Content:  Logical Perceptions: AH of Jesus, denies VH  Suicidal Thoughts:  No  Homicidal Thoughts:  No  Memory:  Immediate;   Fair Recent;   Fair Remote;   Fair  Judgement:  Fair  Insight:  Present  Psychomotor Activity:  Normal  Concentration:  Concentration: Good and Attention Span: Good  Recall:  Good  Fund of Knowledge:  Good  Language:  Good  Akathisia:  No  Handed:  Right  AIMS (if indicated):     Assets:  Desire for  Improvement Physical Health  ADL's:  Intact  Cognition:  WNL  Sleep:  Number of Hours: 5.25   Assessment Lindsey Richmond is a 21 year old female, college student, who was admitted under IVC for acute psychosis. Per EMS, patient was found in the street, naked, holding a sing heaven/hell. She was reportedly calling for Jesus.   # Bipolar I disorder # r/o schizoaffective disorder Exam is notable for her calmer affect, cooperative to the interview, which has been significantly improved from the yesterday evaluation. Patient endorses mild neurovegetative symptoms and AH. Will uptitrate Depakote for mood stabilization. Will continue risperidone/ativan for agitation. Will continue sitter for unpredicted  behavior.   Plan - Continue risperidone 3 mg at night - Continue risperidone 1 mg BID prn for agitation - Continue ativan 2 mg q6hprn for agitation - TSH reviewed, wnl - Increase Depakote 1000 mg at night (consider further uptitration tomorrow as needed and monitor levels)  Treatment Plan Summary: Daily contact with patient to assess and evaluate symptoms and progress in treatment  Neysa Hotter, MD 04/22/2017, 11:53 AM

## 2017-04-22 NOTE — Progress Notes (Signed)
1:1 Note 1400  Patient continues to lay in bed resting with eyes closed.  Respirations even and unlabored.  No signs/symptoms of pain/distress noted on patient's face/body movements.  1:1 continues for patient's safety per MD order.

## 2017-04-22 NOTE — [facility] Group Notes (Signed)
[facility] LCSW Group Therapy  04/22/2017  11:15 AM  Type of Therapy:  Group Therapy  Participation Level:  Did not Attend  Charman Blasco J Garrell Flagg MSW, LCSW  

## 2017-04-22 NOTE — Progress Notes (Signed)
Nursing Note 1:1 Patient seen sleeping after the shift change. Woke up afterwards. Irritable and hyper religious. Patient shouted when I greeted her "get out of here you devil. You need to accept Jesus. Jesus is coming soon...." patient did not respond to any question or engage in conversation rather she kept talking about Jesus. Patient accepted her bedtime med with much encouragement. Staff encouraged fluid as she kept refusing meals and drinks. Patient is in her room at this time. Sitter within reach. Will continue to monitor patient.  Patient remains on 1:1

## 2017-04-22 NOTE — Progress Notes (Signed)
1:1 Note 0830  Patient in bed with eyes open with 1:1 present for safety.  Denied SI.  Patient stated she does have thoughts of hurting people of the unit because of loud voices.  Would not answer A/V hallucinations question.  Patient responding to internal stimuli.  Patient denied pain.  Stated she ate breakfast.  Patient asked nurse to leave her room.  Respirations even and unlabored.  No signs/symptoms of pain/distress noted on patient's face/body movements.  1:1 continues for safety per MD order.

## 2017-04-22 NOTE — Progress Notes (Addendum)
1:1 Note 0945  Patient sitting up in bed staring out of window in her room.  Respirations even and unlabored.  No signs/symptoms of pain/distress noted on patient's face/body movements.  1:1 continues for patient's safety per MD order. Patient was given ativan 2 mg p.o. For anxiety/agitation.  Has been resting in her bed. Patient refused to fill out self inventory sheet this morning.

## 2017-04-23 DIAGNOSIS — R440 Auditory hallucinations: Secondary | ICD-10-CM

## 2017-04-23 NOTE — [facility] Group Notes (Signed)
[facility] LCSW Aftercare Discharge Planning Group Note   04/23/2017 9:57 AM  Participation Quality:  Invited, did not attend  Plan for Discharge/Comments:  Supportive family, had appt at [facility], follow up TBD  Transportation Means:  family Supports: family  Sallee Langenne C Kayanna Mckillop

## 2017-04-23 NOTE — Progress Notes (Signed)
[facility] Post 1:1 Observation Documentation  For the first (8) hours following discontinuation of 1:1 precautions, a progress note entry by nursing staff should be documented at least every 2 hours, reflecting the patient's behavior, condition, mood, and conversation.  Use the progress notes for additional entries.  Time 1:1 discontinued:  1535  Patient's Behavior:  Alert, cooperative  Patient's Condition:  Respirations even and unlabored.  No signs/symptoms of pain/distress noted on patient's face/body movements.  Patient's Conversation:   Patient has stated she would like to go outside for recreation.  Patient informed that recreation is planned for tomorrow morning and tomorrow afternoon.  Patient put on long pants and went to dining room for dinner.  Patient stated she is feeling better today.   Earline MayotteKnight, Bryley Kovacevic Shephard 04/23/2017, 5:37 PM

## 2017-04-23 NOTE — Progress Notes (Signed)
1:1 Note 1030  Patient went to morning group.  Respirations even and unlabored.  No signs/symptoms of pain/distress noted on patient's face/body movements.  1:1 continues per MD order for safety.

## 2017-04-23 NOTE — Progress Notes (Signed)
1:1  Note 1155  Patient resting in room with 1:1 present.  Respirations even and unlabored.  No signs/symptoms of pain/distress noted on patient's face/body movements.   Patient's self inventory sheet, patient sleeps good, sleep medications helpful.  Fair appetite, low energy level, good concentration.  Rated depression 3, hopeless 7, anxiety 8.  Withdrawals, sedation, diarrhea, agitation, irritability.  Denied SI, then marked "sometimes"  Denied physical problems, then checked dizziness, headaches.  Physical pain, neck, no pain medication.  Goal is getting discharge.  Plans to try to socialize and meet /help people.  Does have discharge plans.  Wants head phones. 1:1 continues for safety per MD order.

## 2017-04-23 NOTE — Plan of Care (Signed)
Problem: Health Behavior/Discharge Planning: Goal: Compliance with treatment plan for underlying cause of condition will improve Outcome: Progressing Patient taking medications as prescribed.  Problem: Safety: Goal: Periods of time without injury will increase Outcome: Progressing Patient is on q15 minute safety checks, 1:1 observation and high fall risk precautions. Patient contracts for safety on the unit and remains safe at this time.

## 2017-04-23 NOTE — Progress Notes (Addendum)
1:1 Nursing Progress Note  D: Patient is observed sleeping in bed. Respirations are even & unlabored. Environment is secured. Sitter observed with patient.  A: Patient remains on 1:1 observation per provider orders. High fall risk precautions in place.   R: Patient remains safe on the unit at this time. Will continue to monitor with 1:1 sitter, q15 min safety checks & q4 hour nursing assessments.

## 2017-04-23 NOTE — Plan of Care (Signed)
Problem: Education: Goal: Knowledge of the prescribed therapeutic regimen will improve Outcome: Progressing Nurse discussed depression/anxiety/coping skills with patient.    

## 2017-04-23 NOTE — Progress Notes (Signed)
1:1 Note 1535  Patient's 1:1 has been discontinued.   Patient presently laying in bed with eyes closed.  Respirations even and unlabored.  No signs/symptoms of pain/distress noted on patient's face/body movements.  Safety will be maintained with 15 minute checks.

## 2017-04-23 NOTE — Progress Notes (Signed)
Adult Psychoeducational Group Note  Date:  04/23/2017 Time:  8:40 PM  Group Topic/Focus:  Wrap-Up Group:   The focus of this group is to help patients review their daily goal of treatment and discuss progress on daily workbooks.  Participation Level:  Active  Participation Quality:  Appropriate  Affect:  Appropriate  Cognitive:  Appropriate  Insight: Appropriate  Engagement in Group:  Engaged  Modes of Intervention:  Discussion  Additional Comments: The patient expressed that she rates today a 7.The patient also said that she attended group on discharge plans.  Octavio Mannshigpen, Dondre Catalfamo Lee 04/23/2017, 8:40 PM

## 2017-04-23 NOTE — Tx Team (Signed)
Interdisciplinary Treatment and Diagnostic Plan Update  04/23/2017 Time of Session: 8:30 AM Lindsey Richmond MRN: [id number]  Principal Diagnosis: Acute psychosis  Secondary Diagnoses: Principal Problem:   Acute psychosis Active Problems:   Schizophrenia, paranoid ([place])   Current Medications:  Current Facility-Administered Medications  Medication Dose Route Frequency Provider Last Rate Last Dose  . acetaminophen (TYLENOL) tablet 650 mg  650 mg Oral Q6H PRN [name], NP      . alum & mag hydroxide-simeth (MAALOX/MYLANTA) 200-200-20 MG/5ML suspension 30 mL  30 mL Oral Q4H PRN [name], NP      . benztropine (COGENTIN) tablet 0.5 mg  0.5 mg Oral Daily PRN [name], NP      . divalproex (DEPAKOTE ER) 24 hr tablet 1,000 mg  1,000 mg Oral QHS [name], MD   1,000 mg at 04/22/17 2147  . ibuprofen (ADVIL,MOTRIN) tablet 600 mg  600 mg Oral Q6H PRN [name], NP   600 mg at 04/18/17 2105  . LORazepam (ATIVAN) tablet 2 mg  2 mg Oral Q6H PRN [name], MD   2 mg at 04/23/17 0906  . magnesium hydroxide (MILK OF MAGNESIA) suspension 30 mL  30 mL Oral Daily PRN [name], NP      . ondansetron (ZOFRAN-ODT) disintegrating tablet 4 mg  4 mg Oral Q8H PRN [name], NP      . risperiDONE (RISPERDAL M-TABS) disintegrating tablet 1 mg  1 mg Oral BID PRN [name], MD      . risperiDONE (RISPERDAL M-TABS) disintegrating tablet 3 mg  3 mg Oral QHS [name], MD   3 mg at 04/22/17 2147    PTA Medications: Prescriptions Prior to Admission  Medication Sig Dispense Refill Last Dose  . minocycline (MINOCIN,DYNACIN) 100 MG capsule Take 100 mg by mouth 2 (two) times daily.   Past Week at Unknown time    Patient Stressors: Other: psychosis  Patient Strengths: Physical Health Supportive family/friends  Treatment Modalities: Medication Management, Group therapy, Case management,  1 to 1 session with clinician, Psychoeducation, Recreational  therapy.   Physician Treatment Plan for Primary Diagnosis: Acute psychosis Long Term Goal(s): Improvement in symptoms so as ready for discharge  Short Term Goals: Ability to identify changes in lifestyle to reduce recurrence of condition will improve Ability to verbalize feelings will improve Ability to disclose and discuss suicidal ideas Ability to demonstrate self-control will improve Ability to identify and develop effective coping behaviors will improve Ability to maintain clinical measurements within normal limits will improve Compliance with prescribed medications will improve Ability to identify triggers associated with substance abuse/mental health issues will improve  Medication Management: Evaluate patient's response, side effects, and tolerance of medication regimen.  Therapeutic Interventions: 1 to 1 sessions, Unit Group sessions and Medication administration.  Evaluation of Outcomes: Progressing  Physician Treatment Plan for Secondary Diagnosis: Principal Problem:   Acute psychosis Active Problems:   Schizophrenia, paranoid ([name])   Long Term Goal(s): Improvement in symptoms so as ready for discharge  Short Term Goals: Ability to identify changes in lifestyle to reduce recurrence of condition will improve Ability to verbalize feelings will improve Ability to disclose and discuss suicidal ideas Ability to demonstrate self-control will improve Ability to identify and develop effective coping behaviors will improve Ability to maintain clinical measurements within normal limits will improve Compliance with prescribed medications will improve Ability to identify triggers associated with substance abuse/mental health issues will improve  Medication Management: Evaluate patient's response,  side effects, and tolerance of medication regimen.  Therapeutic Interventions: 1 to 1 sessions, Unit Group sessions and Medication administration.  Evaluation of Outcomes:  Progressing   RN Treatment Plan for Primary Diagnosis: Acute psychosis Long Term Goal(s): Knowledge of disease and therapeutic regimen to maintain health will improve  Short Term Goals: Ability to identify and develop effective coping behaviors will improve and Compliance with prescribed medications will improve  Medication Management: RN will administer medications as ordered by provider, will assess and evaluate patient's response and provide education to patient for prescribed medication. RN will report any adverse and/or side effects to prescribing provider.  Therapeutic Interventions: 1 on 1 counseling sessions, Psychoeducation, Medication administration, Evaluate responses to treatment, Monitor vital signs and CBGs as ordered, Perform/monitor CIWA, COWS, AIMS and Fall Risk screenings as ordered, Perform wound care treatments as ordered.  Evaluation of Outcomes: Progressing   Recreational Therapy Treatment Plan for Primary Diagnosis: Acute psychosis Long Term Goal(s): Patient will participate in recreation therapy treatment in at least 2 group sessions without prompting from LRT  Short Term Goals: Patient will demonstrate increased ability to follow instructions, as demonstrated by ability to follow LRT instructions on first prompt during recreation therapy group sessions  Treatment Modalities: Group and Pet Therapy  Therapeutic Interventions: Psychoeducation  Evaluation of Outcomes: Progressing   LCSW Treatment Plan for Primary Diagnosis: Acute psychosis Long Term Goal(s): Safe transition to appropriate next level of care at discharge, Engage patient in therapeutic group addressing interpersonal concerns.  Short Term Goals: Engage patient in aftercare planning with referrals and resources  Therapeutic Interventions: Assess for all discharge needs, 1 to 1 time with Social worker, Explore available resources and support systems, Assess for adequacy in community support network,  Educate family and significant other(s) on suicide prevention, Complete Psychosocial Assessment, Interpersonal group therapy.  Evaluation of Outcomes: Not Met  Pt unable to engage in meaningful conversation about dispositional plan  7/23:  Patient continues to be unable to participate in discharge planning, CSW will discuss plan w family.  Father intends for patient to return home, aftecare provider TBD.    Progress in Treatment: Attending groups: No Participating in groups:No Taking medication as prescribed: Yes Toleration medication: Yes, no side effects reported at this time Family/Significant other contact made:Yes, father Larue Drawdy Patient understands diagnosis: No Limited insight Discussing patient identified problems/goals with staff: Yes Medical problems stabilized or resolved: Yes Denies suicidal/homicidal ideation: Yes Issues/concerns per patient self-inventory: None Other: N/A  New problem(s) identified: None identified at this time.   New Short Term/Long Term Goal(s): None identified at this time.   Discharge Plan or Barriers: family unsure where they would like her to follow up   Reason for Continuation of Hospitalization: Disorganization Mood instability Bizarre behavior Medication stabilization   Estimated Length of Stay: 5-7 days, 04/27/17  Attendees: Patient: 04/23/2017  11:49 AM  Physician: Eduard Clos, MD 04/23/2017  11:49 AM  Nursing: Rise Paganini RN 04/23/2017  11:49 AM  RN Care Manager: Lars Pinks, RN 04/23/2017  11:49 AM  Social Worker: Edwyna Shell LCSW 04/23/2017  11:49 AM  Recreational Therapist:  04/23/2017  11:49 AM  Other: Norberto Sorenson 04/23/2017  11:49 AM  Other:  04/23/2017  11:49 AM    Scribe for Treatment Team:  Edwyna Shell LCSW 04/23/2017 11:49 AM

## 2017-04-23 NOTE — Progress Notes (Signed)
1:1 Note 0915  Patient has been in her room this morning.  Took shower.  Ate 100% breakfast and then ate bowl of cereal.  Patient denied SI and HI.  Denied A/V hallucinations.  Denied pain.  Respirations even and unlabored.  No signs/symptoms of pain/distress noted on patient's face/body movements.  Patient talked to nurse today and did not tell her to leave her alone and leave her room.  Patient is slow to speak and answer questions.  Patient given ativan 2 mg p.o. Anxiety.  1:1 continues for safety per MD order.

## 2017-04-23 NOTE — Progress Notes (Addendum)
1:1 Nursing Progress Note  D: Patient is observed sleeping in bed. Respirations are even & unlabored. Environment is secured. Sitter observed with patient.  A: Patient remains on 1:1 observation per provider orders. High fall risk precautions in place.   R: Patient remains safe on the unit at this time. Will continue to monitor with 1:1 sitter, q15 min safety checks & q4 hour nursing assessments.   

## 2017-04-23 NOTE — Progress Notes (Signed)
1:1 Note: 1500  Patient ate lunch in her room.  Respirations even and unlabored.  No signs/symptoms of pain/distress noted on patient's face/body movements.  Patient has been resting in her bed, ate snack.  1:1 continues per MD order for safety.

## 2017-04-23 NOTE — [facility] Group Notes (Signed)
[facility] LCSW Group Therapy  04/23/2017 1:30 - 2:15 PM  Todays Topic: Overcoming Obstacles. Patients identified one short term goal and potential obstacles in reaching this goal. Patients processed barriers involved in overcoming these obstacles. Patients identified steps necessary for overcoming these obstacles and explored motivation (internal and external) for facing these difficulties head on.  Type of Therapy:  Group Therapy  Participation Level: Limited  Participation Quality:  Limited  Affect:  Flate  Cognitive: Limited  Insight:  Minimal  Engagement in Therapy:  Minimal  Modes of Intervention:  Discussion, Problem-solving and Support  Summary of Progress/Problems: States she is only attending groups "because they say I have to be around people in order to be dismissed from here.  I dont like to be around people." Unable to elaborate upon what she does not like about socialization nor what might facilitate contact w others. Remains isolative, even in presence of others.  Sallee Langenne C Wyndham Santilli  LCSW 04/23/2017, 3:16 PM

## 2017-04-23 NOTE — Progress Notes (Signed)
Patient stated she wants to use her phone that is in her locker.  Patient informed that no cell phones are on the adult unit.  There are two phones she can use on [address].  Patient sitting on her floor playing with small blocks.

## 2017-04-23 NOTE — Progress Notes (Signed)
[facility] MD Progress Note  04/23/2017 3:26 PM Lindsey Richmond  MRN:  [id number]  Subjective: Lindsey Richmond reports, "I'm doing great. I think I have improved a lot from all those feelings I was having. I want to know if my mom brought me my headphones? I don't feel like hurting anyone any more. My frustrations now is being in here. I'm now wondering about my dispatch".   Objective: Lindsey Richmond is seen, chart reviewed. This case discussed with the nursing staff. She is on 1;1 supervision. She is sitting up in her bed with a paper & colored pencils drawing a picture. She is making good eye contact. She is verbally responsive making reasonable statements & request. Her affect is reactive as well as appropriate. This is my first time observing Lindsey Richmond smile & using what appears to be good judgement. She states that she is feeling improved. She wonders what the plan for her "dispatch" is, meaning discharge. She remembers voicing HI towards others & says that this is no longer the case as she does not feel like that anyomore. She denies any new issues or concerns. Staff reports that Lindsey Richmond is visible on the unit, attending & participating in group sessions. No disruptive behavior reported. Taking & tolerating her medications without any adverse effects reported.  Principal Problem: Acute psychosis  Diagnosis:   Patient Active Problem List   Diagnosis Date Noted  . Schizophrenia, paranoid (HCC) [F20.0] 04/20/2017    Priority: High  . Acute psychosis [F23] 04/18/2017   Total Time spent with patient: 15 minutes  Past Psychiatric History: Schizophrenia  Past Medical History: History reviewed. No pertinent past medical history. History reviewed. No pertinent surgical history. Family History: History reviewed. No pertinent family history.  Family Psychiatric  History: See H&P.   Social History:  History  Alcohol use Not on file     History  Drug use: Unknown    Social History   Social History  . Marital status:  Single    Spouse name: N/A  . Number of children: N/A  . Years of education: N/A   Social History Main Topics  . Smoking status: Unknown If Ever Smoked  . Smokeless tobacco: None  . Alcohol use None  . Drug use: Unknown  . Sexual activity: Not Asked   Other Topics Concern  . None   Social History Narrative  . None   Additional Social History:   Sleep: Fair  Appetite:  Good  Current Medications: Current Facility-Administered Medications  Medication Dose Route Frequency Provider Last Rate Last Dose  . acetaminophen (TYLENOL) tablet 650 mg  650 mg Oral Q6H PRN Charm Rings, NP      . alum & mag hydroxide-simeth (MAALOX/MYLANTA) 200-200-20 MG/5ML suspension 30 mL  30 mL Oral Q4H PRN Charm Rings, NP      . benztropine (COGENTIN) tablet 0.5 mg  0.5 mg Oral Daily PRN Armandina Stammer I, NP      . divalproex (DEPAKOTE ER) 24 hr tablet 1,000 mg  1,000 mg Oral QHS Lindsey Hotter, MD   1,000 mg at 04/22/17 2147  . ibuprofen (ADVIL,MOTRIN) tablet 600 mg  600 mg Oral Q6H PRN Charm Rings, NP   600 mg at 04/18/17 2105  . LORazepam (ATIVAN) tablet 2 mg  2 mg Oral Q6H PRN Lindsey Hotter, MD   2 mg at 04/23/17 0906  . magnesium hydroxide (MILK OF MAGNESIA) suspension 30 mL  30 mL Oral Daily PRN Charm Rings, NP      .  ondansetron (ZOFRAN-ODT) disintegrating tablet 4 mg  4 mg Oral Q8H PRN Charm RingsLord, Jamison Y, NP      . risperiDONE (RISPERDAL M-TABS) disintegrating tablet 1 mg  1 mg Oral BID PRN Lindsey HotterHisada, Reina, MD      . risperiDONE (RISPERDAL M-TABS) disintegrating tablet 3 mg  3 mg Oral QHS Richmond, Lindsey Cougheina, MD   3 mg at 04/22/17 2147    Lab Results:  Results for orders placed or performed during the hospital encounter of 04/18/17 (from the past 48 hour(s))  TSH     Status: None   Collection Time: 04/22/17  6:31 AM  Result Value Ref Range   TSH 0.789 0.350 - 4.500 uIU/mL    Comment: Performed by a 3rd Generation assay with a functional sensitivity of <=0.01 uIU/mL. Performed at [facility], [address] [place], [place] [address]    Blood Alcohol level:  Lab Results  Component Value Date   ETH <5 04/17/2017   Metabolic Disorder Labs: No results found for: HGBA1C, MPG No results found for: PROLACTIN No results found for: CHOL, TRIG, HDL, CHOLHDL, VLDL, LDLCALC  Physical Findings: AIMS: Facial and Oral Movements Muscles of Facial Expression: None, normal Lips and Perioral Area: None, normal Jaw: None, normal Tongue: None, normal,Extremity Movements Upper (arms, wrists, hands, fingers): None, normal Lower (legs, knees, ankles, toes): None, normal, Trunk Movements Neck, shoulders, hips: None, normal, Overall Severity Severity of abnormal movements (highest score from questions above): None, normal Incapacitation due to abnormal movements: None, normal Patient's awareness of abnormal movements (rate only patient's report): No Awareness, Dental Status Current problems with teeth and/or dentures?: No Does patient usually wear dentures?: No  CIWA:  CIWA-Ar Total: 2 COWS:     Musculoskeletal: Strength & Muscle Tone: within normal limits Gait & Station: normal Patient leans: N/A  Psychiatric Specialty Exam: Physical Exam  Review of Systems  Psychiatric/Behavioral: Positive for depression and hallucinations. Negative for substance abuse and suicidal ideas. The patient is not nervous/anxious and does not have insomnia.   All other systems reviewed and are negative.   Blood pressure 125/70, pulse (!) 101, temperature 99.1 F (37.3 C), resp. rate 16, height 5\' 4"  (1.626 m), weight 80.7 kg (178 lb).Body mass index is 30.55 kg/m.  General Appearance: Fairly Groomed  Eye Contact:  Good  Speech:  mumbles  Volume:  Normal  Mood:  "fine"  Affect:  Reactive  Thought Process:  Coherent  Orientation:  Full (Time, Place, and Person)  Thought Content:  Logical, denies any AVH.  Suicidal Thoughts:  No  Homicidal Thoughts:  No  Memory:   Immediate;   Good Recent;   Good Remote;   Fair  Judgement:  Fair  Insight:  Present  Psychomotor Activity:  Normal  Concentration:  Concentration: Good and Attention Span: Good  Recall:  Good  Fund of Knowledge:  Good  Language:  Good  Akathisia:  No  Handed:  Right  AIMS (if indicated):     Assets:  Desire for Improvement Physical Health  ADL's:  Intact  Cognition:  WNL  Sleep:  Number of Hours: 5   Assessment [name] is a 21 year old female, college student, who was admitted under IVC for acute psychosis. Per EMS, patient was found in the street, naked, holding a sing heaven/hell. She was reportedly calling for Jesus.   # Bipolar I disorder # r/o schizoaffective disorder Exam is notable for her calmer affect, cooperative to the interview, which has been significantly improved  from the yesterday evaluation. Patient endorses mild neurovegetative symptoms and AH. Will uptitrate Depakote for mood stabilization. Will continue risperidone/ativan for agitation. Will continue sitter for unpredicted behavior.   Plan - Continue risperidone 3 mg at night - Continue risperidone 1 mg BID prn for agitation - Continue ativan 2 mg q6hprn for agitation - TSH reviewed, wnl - Increase Depakote 1000 mg at night 04-22-17. Obtain Depakote on 04-25-17. Discontinue 1:1 supervision as behavior seem to have improved.  Treatment Plan Summary: Daily contact with patient to assess and evaluate symptoms and progress in treatment  Sanjuana Kava, NP 04/23/2017, 3:26 PMPatient ID: [name], female   DOB: [birth year], 21 y.o.   MRN: [id number]

## 2017-04-23 NOTE — Progress Notes (Signed)
Recreation Therapy Notes  Date: 04/23/2017 Time: 10:00am Location: [address] Dayroom  Group Topic: Coping Skills  Goal Area(s) Addresses:  Pt will successfully identify at least five things that are preventing them from moving forward. Pt will successfully identify at least two coping skills they can use to move forward from the places they are currently "stuck."  Behavioral Response: Engaged, Thought-Blocking  Intervention: Art  Activity: Pt was given a piece of printer paper and asked to draw a spider web. Pt was asked to identify at least 5 things holding them back and 10 coping skills to help them get past the things that are holding them back. Pt then shared their spider webs with peers.  Education: PharmacologistCoping Skills, Discharge Planning  Education Outcome: Needs additional education   Clinical Observations/Feedback:  Approximately 10:28 pt joined group. Recreation Therapy Intern provided pt with supplies to complete activity. Pt began drawing a spider web and verbally identified that she needed more money and she had a mission that pertained to God.  Marvell Fullerachel Meyer, Recreation Therapy Intern  Caroll RancherMarjette Elbridge Magowan, LRT/CTRS

## 2017-04-23 NOTE — Progress Notes (Signed)
[facility] Post 1:1 Observation Documentation  For the first (8) hours following discontinuation of 1:1 precautions, a progress note entry by nursing staff should be documented at least every 2 hours, reflecting the patient's behavior, condition, mood, and conversation.  Use the progress notes for additional entries.  Time 1:1 discontinued:   1535  Patient's Behavior:   Alert, cooperative  Patient's Condition:  Respirations even and unlabored.  No signs/symptoms of pain/distress noted on patient's face/body movements.  Patient's Conversation:   Patient resting in bed with eyes open.  Patient stated she is feeling good.  No problems at this time.   Earline MayotteKnight, Ryatt Corsino Shephard 04/23/2017, 6:35 PM

## 2017-04-24 MED ORDER — RISPERIDONE 2 MG PO TABS
ORAL_TABLET | ORAL | Status: AC
  Filled 2017-04-24: qty 1

## 2017-04-24 NOTE — Progress Notes (Signed)
D: Pt at the time of assessment was flat, isolative and withdrawn to room. Pt could be a little bizarre and hyper-religious-laughing inappropriately while staring at the RN; state, "you need to repent your sins." Pt denied depression, anxiety, pain, SI, HI or AVH.  A: Medications offered as prescribed. All patient's questions and concerns addressed. Support, encouragement, and safe environment provided. 15-minute safety checks continue. R: Pt was med compliant. Pt attended wrap-up group. Safety checks continue.

## 2017-04-24 NOTE — Plan of Care (Signed)
Problem: Education: Goal: Verbalization of understanding the information provided will improve Outcome: Not Progressing Pt unable to demonstrate understanding of materials / information thus far. Remains selectively mute,  irritable and easily agitated on interactions.   Problem: Self-Care: Goal: Ability to participate in self-care as condition permits will improve Outcome: Not Progressing Pt refused to perform ADLs thus far this shift. Staff continues to provide encouragement and support to assist pt meet her goal.

## 2017-04-24 NOTE — Plan of Care (Signed)
Problem: Activity: Goal: Interest or engagement in activities will improve Outcome: Progressing Pt participated in group this evening.    

## 2017-04-24 NOTE — Progress Notes (Signed)
[facility] MD Progress Note  04/24/2017 11:31 AM Lindsey Richmond  MRN:  [id number]  Subjective: Lindsey Richmond reports, "Last night was good. The medicines made me drowsy & confused, I had amnesia. My faith is in God. I love [address], I want to come with him. The voices, some have gone away with..... The group sessions was fun today. We got to do some exercise".   Objective: Lindsey Richmond is seen, chart reviewed. This case discussed with the nursing staff. He 1:1 supervision was discontinue yesterday. Staff has reported no disruptive behavior on the unit. She is visible on the unit.  She is making good eye contact. She is verbally responsive. Her affect is reactive as well as appropriate. She states that she is feeling improved. She wonders what the plan for her "dispatch" is, meaning discharge. She remembers voicing HI towards others & says that this is no longer the case as she does not feel like that anyomore. She denies any new issues or concerns. And yet, she is noted to be hyper religious today. See the above notes. Staff reports that Lindsey Richmond is visible on the unit, attending & participating in group sessions. No disruptive behavior reported. Taking & tolerating her medications without any adverse effects reported.  Principal Problem: Acute psychosis  Diagnosis:   Patient Active Problem List   Diagnosis Date Noted  . Schizophrenia, paranoid (HCC) [F20.0] 04/20/2017    Priority: High  . Acute psychosis [F23] 04/18/2017   Total Time spent with patient: 15 minutes  Past Psychiatric History: Schizophrenia  Past Medical History: History reviewed. No pertinent past medical history. History reviewed. No pertinent surgical history. Family History: History reviewed. No pertinent family history.  Family Psychiatric  History: See H&P.   Social History:  History  Alcohol use Not on file     History  Drug use: Unknown    Social History   Social History  . Marital status: Single    Spouse name: N/A  . Number of  children: N/A  . Years of education: N/A   Social History Main Topics  . Smoking status: Unknown If Ever Smoked  . Smokeless tobacco: None  . Alcohol use None  . Drug use: Unknown  . Sexual activity: Not Asked   Other Topics Concern  . None   Social History Narrative  . None   Additional Social History:   Sleep: Fair  Appetite:  Good  Current Medications: Current Facility-Administered Medications  Medication Dose Route Frequency Provider Last Rate Last Dose  . acetaminophen (TYLENOL) tablet 650 mg  650 mg Oral Q6H PRN Charm Rings, NP      . alum & mag hydroxide-simeth (MAALOX/MYLANTA) 200-200-20 MG/5ML suspension 30 mL  30 mL Oral Q4H PRN Charm Rings, NP      . benztropine (COGENTIN) tablet 0.5 mg  0.5 mg Oral Daily PRN Armandina Stammer I, NP      . divalproex (DEPAKOTE ER) 24 hr tablet 1,000 mg  1,000 mg Oral QHS Neysa Hotter, MD   1,000 mg at 04/23/17 2151  . ibuprofen (ADVIL,MOTRIN) tablet 600 mg  600 mg Oral Q6H PRN Charm Rings, NP   600 mg at 04/18/17 2105  . LORazepam (ATIVAN) tablet 2 mg  2 mg Oral Q6H PRN Neysa Hotter, MD   2 mg at 04/23/17 0906  . magnesium hydroxide (MILK OF MAGNESIA) suspension 30 mL  30 mL Oral Daily PRN Charm Rings, NP      . ondansetron (ZOFRAN-ODT) disintegrating tablet 4 mg  4 mg Oral Q8H PRN Charm RingsLord, Jamison Y, NP      . risperiDONE (RISPERDAL M-TABS) disintegrating tablet 1 mg  1 mg Oral BID PRN Neysa HotterHisada, Reina, MD      . risperiDONE (RISPERDAL M-TABS) disintegrating tablet 3 mg  3 mg Oral QHS Neysa HotterHisada, Reina, MD   3 mg at 04/23/17 2152    Lab Results:  No results found for this or any previous visit (from the past 48 hour(s)). Blood Alcohol level:  Lab Results  Component Value Date   ETH <5 04/17/2017   Metabolic Disorder Labs: No results found for: HGBA1C, MPG No results found for: PROLACTIN No results found for: CHOL, TRIG, HDL, CHOLHDL, VLDL, LDLCALC  Physical Findings: AIMS: Facial and Oral Movements Muscles of Facial  Expression: None, normal Lips and Perioral Area: None, normal Jaw: None, normal Tongue: None, normal,Extremity Movements Upper (arms, wrists, hands, fingers): None, normal Lower (legs, knees, ankles, toes): None, normal, Trunk Movements Neck, shoulders, hips: None, normal, Overall Severity Severity of abnormal movements (highest score from questions above): None, normal Incapacitation due to abnormal movements: None, normal Patient's awareness of abnormal movements (rate only patient's report): No Awareness, Dental Status Current problems with teeth and/or dentures?: No Does patient usually wear dentures?: No  CIWA:  CIWA-Ar Total: 2 COWS:     Musculoskeletal: Strength & Muscle Tone: within normal limits Gait & Station: normal Patient leans: N/A  Psychiatric Specialty Exam: Physical Exam: Nurses notes & Vital signs reviewed.  Review of Systems  Psychiatric/Behavioral: Positive for depression and hallucinations. Negative for substance abuse and suicidal ideas. The patient is not nervous/anxious and does not have insomnia.   All other systems reviewed and are negative.   Blood pressure 123/73, pulse (!) 116, temperature 98.3 F (36.8 C), temperature source Oral, resp. rate 17, height 5\' 4"  (1.626 m), weight 80.7 kg (178 lb).Body mass index is 30.55 kg/m.  General Appearance: Fairly Groomed  Eye Contact:  Good  Speech:  mumbles  Volume:  Normal  Mood:  "fine"  Affect:  Reactive  Thought Process:  Coherent  Orientation:  Full (Time, Place, and Person)  Thought Content:  Presents with hyper religiousity today,   Suicidal Thoughts:  No  Homicidal Thoughts:  No  Memory:  Immediate;   Good Recent;   Good Remote;   Fair  Judgement:  Fair  Insight:  Present  Psychomotor Activity:  Normal  Concentration:  Concentration: Good and Attention Span: Good  Recall:  Good  Fund of Knowledge:  Good  Language:  Good  Akathisia:  No  Handed:  Right  AIMS (if indicated):     Assets:   Desire for Improvement Physical Health  ADL's:  Intact  Cognition:  WNL  Sleep:  Number of Hours: 3   Assessment Lindsey Richmond is a 21 year old female, college student, who was admitted under IVC for acute psychosis. Per EMS, patient was found in the street, naked, holding a sing heaven/hell. She was reportedly calling for Jesus.   # Bipolar I disorder # r/o schizoaffective disorder Exam is notable for her calmer affect, cooperative to the interview, which has been significantly improved from the yesterday evaluation. Patient endorses mild neurovegetative symptoms and AH. Will uptitrate Depakote for mood stabilization. Will continue risperidone/ativan for agitation. Will continue sitter for unpredicted behavior.   Plan - Continue risperidone 3 mg at night - Continue risperidone 1 mg BID prn for agitation - Continue ativan 2 mg q6hprn for agitation - TSH reviewed, wnl - Continue  Depakote 1000 mg at night 04-22-17. Obtain Depakote on 04-25-17. 1:1 supervision discontinued as behavior seem to have improved.  Treatment Plan Summary: Daily contact with patient to assess and evaluate symptoms and progress in treatment  Sanjuana Kava, NP 04/24/2017, 11:31 AMPatient ID: Lindsey File, female   DOB: 1995-12-16, 21 y.o.   MRN: [id number]

## 2017-04-24 NOTE — [facility] Group Notes (Signed)
[facility] LCSW Group Therapy 04/24/2017 11:00 AM  Type of Therapy: Group Therapy- Feelings about Diagnosis  Participation Level: Active   Participation Quality:  Appropriate  Affect:  Blunted  Cognitive: Alert and Oriented   Insight:  Developing   Engagement in Therapy: Developing/Improving and Engaged   Modes of Intervention: Clarification, Confrontation, Discussion, Education, Exploration, Limit-setting, Orientation, Problem-solving, Rapport Building, Dance movement psychotherapisteality Testing, Socialization and Support  Description of Group:   This group will allow patients to explore their thoughts and feelings about diagnoses they have received. Patients will be guided to explore their level of understanding and acceptance of these diagnoses. Facilitator will encourage patients to process their thoughts and feelings about the reactions of others to their diagnosis, and will guide patients in identifying ways to discuss their diagnosis with significant others in their lives. This group will be process-oriented, with patients participating in exploration of their own experiences as well as giving and receiving support and challenge from other group members.  Summary of Progress/Problems:  Pt reported that she feels she has an Asperger's diagnosis due to her social difficulties and rigid need for routine. Pt reports that she also has a history of psychosis which can be very anxiety provoking for her.   Therapeutic Modalities:   Cognitive Behavioral Therapy Solution Focused Therapy Motivational Interviewing Relapse Prevention Therapy  Vernie ShanksLauren Lamoyne Hessel, LCSW 04/24/2017 1:01 PM

## 2017-04-24 NOTE — Progress Notes (Addendum)
  DATA ACTION RESPONSE  Objective- Pt. is visible in the dayroom, seen eating a snack.Presents with an animated affect and mood. Pt. engages in conversation and is slow to respond but is appropriate. C/o of constipation but refuse PRN at this time. No abnormal s/s.  Subjective- Denies having any SI/HI/AVH/Pain at this time.Pt. states "I've had a good day; Do you know when I get to go home?". Is cooperative and remain safe on the unit.  1:1 interaction in private to establish rapport. Encouragement, education, & support given from staff.    Safety maintained with Q 15 checks. Continue with POC.

## 2017-04-24 NOTE — Progress Notes (Signed)
Recreation Therapy Notes  Date: 04/24/2017 Time: 10:00am Location: [address] Dayroom  Group Topic: Exercise  Goal Area(s) Addresses:  Pt will be able to identify the benefits of exercising. Pt will be able to experience the benefits of exercising during the group.  Behavioral Response: Engaged  Intervention: Exercise  Activity: Pt will throw two dice and depending on the number they roll they will do the exercise that corresponds with that number.  Education: Exercise, Discharge Planning  Education Outcome: Acknowledges understanding  Clinical Observations/Feedback: Pt participated in different exercises such as squats, push ups and rotating her arms forwards. Approximately 10:35am pt was asked to speak with the doctor and approximately 10:40am pt returned to group. Pt identified that people participate in exercise to look their best and to release steam.   Marvell Fullerachel Richmond, Recreation Therapy Intern  Lindsey Richmond, LRT/CTRS

## 2017-04-24 NOTE — Progress Notes (Signed)
D: Presents with flat affect and irritable mood earlier this shift. However, pt brightened up and became more engaged verbally with Clinical research associatewriter. Denies SI, HI, VH and pain. Endorsed +AH "the voices are little bit better now". Concerned about d/c, "I want to know about my dispatch, I want to go home now, I miss my family". Visible in scheduled groups. Off unit with peers for meals and to the gym for recreational activities, returned without issues. Declined to shower and change scrubs when encouraged "not now, maybe later".  A: Medications administered as prescribed and effects monitored. Continued support and encouragement provided to pt throughout this shift. Q 15 minutes safety checks maintained without self harm gestures or outburst to note. R: Pt receptive to care. Compliant with medications when offered. Denies adverse drug reactions when assessed. Remains safe on and off unit. POC continues for safety and mood stability.

## 2017-04-25 LAB — VALPROIC ACID LEVEL: VALPROIC ACID LVL: 76 ug/mL (ref 50.0–100.0)

## 2017-04-25 MED ORDER — DIVALPROEX SODIUM ER 500 MG PO TB24: 1000.0000 mg | ORAL_TABLET | Freq: Every day | ORAL | 0 refills | Status: DC

## 2017-04-25 MED ORDER — RISPERIDONE 3 MG PO TBDP: 3.0000 mg | ORAL_TABLET | Freq: Every day | ORAL | 0 refills | Status: DC

## 2017-04-25 MED ORDER — RISPERIDONE 1 MG PO TBDP: 1.0000 mg | ORAL_TABLET | Freq: Two times a day (BID) | ORAL | 0 refills | Status: DC | PRN

## 2017-04-25 MED ORDER — BENZTROPINE MESYLATE 0.5 MG PO TABS: 0.5000 mg | ORAL_TABLET | Freq: Every day | ORAL | 0 refills | Status: DC | PRN

## 2017-04-25 MED ORDER — LORAZEPAM 2 MG PO TABS: 2.0000 mg | ORAL_TABLET | Freq: Four times a day (QID) | ORAL | 0 refills | Status: DC | PRN

## 2017-04-25 NOTE — Progress Notes (Signed)
Patient ID: Lindsey Richmond, female   DOB: 22-Sep-1996, 21 y.o.   MRN: [id number] Pt d/c to home with mother. D/c instructions, rx's, transitional AVS given and reviewed with pt and mother. Both verbalize understanding. Items returned from locker.

## 2017-04-25 NOTE — Discharge Summary (Signed)
Physician Discharge Summary Note  Patient:  Lindsey Richmond is an 21 y.o., female MRN:  [id number] DOB:  [birth year] Patient phone:  [phone] (home)   Patient address:   [address] [place] [address],   Total Time spent with patient: Greater than 30 minutes  Date of Admission:  04/18/2017 Date of Discharge: 04-25-17  Reason for Admission: Acute psychosis   Principal Problem: Schizophrenia, paranoid-type.  Discharge Diagnoses: Patient Active Problem List   Diagnosis Date Noted  . Schizophrenia, paranoid (HCC) [F20.0] 04/20/2017    Priority: High  . Acute psychosis [F23] 04/18/2017   Past Psychiatric History: Hx. Schizophrenia  Past Medical History: History reviewed. No pertinent past medical history. History reviewed. No pertinent surgical history.  Family History: History reviewed. No pertinent family history.  Family Psychiatric  History: See H&P  Social History:  History  Alcohol use Not on file     History  Drug use: Unknown    Social History   Social History  . Marital status: Single    Spouse name: N/A  . Number of children: N/A  . Years of education: N/A   Social History Main Topics  . Smoking status: Unknown If Ever Smoked  . Smokeless tobacco: None  . Alcohol use None  . Drug use: Unknown  . Sexual activity: Not Asked   Other Topics Concern  . None   Social History Narrative  . None   Hospital Course:  Lindsey FileMaiya Richmond is 21 years old female, college student admitted involuntarily under emergently from the emergency department for treatment of acute psychosis. Patient was found in the emergency department rocking back-and-forth and making punching movements with her hands. Patient was found hyper-religious, bizarre, preoccupied and a poor historian. Reportedly patient was found walking down the street naked with a sign that read heaven or hell. Patient required chemical restraints with the Geodon 20 mg in emergency department and also Ativan to  control her acute psychosis. Patient is currently placed on Risperdal M tablet is 1 mg twice daily. Patient was not able to respond verbally after waking her up with tactile stimuli she appeared stating without any responses during my evaluation. Patient urine drug screen is negative for drugs of abuse and patient blood alcohol is not significant. Most of the information obtained from available medical records from the clinician assessment, ER physician assessment and talking with the patient father on the phone.  Lindsey Richmond was admitted to the [facility] adult unit for worsening symptoms of Schizophrenia. She presented to  the hospital psychotic, paranoid & hyper-religious. She was in need of mood stabilization treatment. After evaluation of her presenting symptoms, the medication regimen targeting those symptoms were initiated. She received & was discharged on; Cogentin 0.5 mg for prevention of EPS, Depakote 1,000 mg for mood stabilization, Ativan 2 mg prn for severe anxiety/agitation & Risperdal disintegrating 1 mg prn for agitation & 3 mg tablets  for mood control. Lindsey Richmond was oriented to the unit and encouraged to participate in the unit programming. However, she presented too acute on admission warranting the need for 1:1 supervision for most of her days here in the hospital. She presented no other significant pre-existing medical problems that required treatment. She tolerated her treatment regimen without any adverse effects or reactions reported.  During the course of her hospitalization, Lindsey Richmond was evaluated daily by a clinical provider to ascertain her response to her treatment regimen. As the days go by, improvement was noted as evidenced by her report of decreasing symptoms, improved sleep,  mood, affect & participation in the unit programming.  She was required on daily basis to complete a self-inventory asssessment noting mood, mental status, any new symptoms, anxiety or concerns. Her symptoms responded well to  her treatment regimen, being in a therapeutic & supportive environment also assisted in her mood stability. Her 1:1 supervision status was eventually discontinued.  On this day of her hospital discharge, Lindsey Richmond is in much improved condition than upon admission. Her symptoms were reported as significantly decreased or resolved completely. Upon discharge, she denies SIHI and voiced no AVH. However, she still retained a litltle bit of her hyper-religousity, only that it was not as intense or intrusive. Lindsey Richmond was motivated to continue taking medication with a goal of continued improvement in her mental health. She is discharged to follow-up care on an outpatient basis as noted below. She is provided with all the necessary information needed to make this appointment without problems. She left [facility] in no apparent distress with all personal belongings. Transportation per family.  Physical Findings: AIMS: Facial and Oral Movements Muscles of Facial Expression: None, normal Lips and Perioral Area: None, normal Jaw: None, normal Tongue: None, normal,Extremity Movements Upper (arms, wrists, hands, fingers): None, normal Lower (legs, knees, ankles, toes): None, normal, Trunk Movements Neck, shoulders, hips: None, normal, Overall Severity Severity of abnormal movements (highest score from questions above): None, normal Incapacitation due to abnormal movements: None, normal Patient's awareness of abnormal movements (rate only patient's report): No Awareness, Dental Status Current problems with teeth and/or dentures?: No Does patient usually wear dentures?: No  CIWA:  CIWA-Ar Total: 2 COWS:     Musculoskeletal: Strength & Muscle Tone: within normal limits Gait & Station: normal Patient leans: N/A  Psychiatric Specialty Exam: Physical Exam  Constitutional: She appears well-developed.  HENT:  Head: Normocephalic.  Eyes: Pupils are equal, round, and reactive to light.  Neck: Normal range of motion.   Respiratory: Effort normal.  GI: Soft.  Genitourinary:  Genitourinary Comments: Deferred  Musculoskeletal: Normal range of motion.  Neurological: She is alert.  Skin: Skin is warm.    Review of Systems  Constitutional: Negative.   HENT: Negative.   Eyes: Negative.   Respiratory: Negative.   Cardiovascular: Negative.   Gastrointestinal: Negative.   Genitourinary: Negative.   Musculoskeletal: Negative.   Skin: Negative.   Neurological: Negative.   Endo/Heme/Allergies: Negative.   Psychiatric/Behavioral: Positive for depression (Stable) and hallucinations (Hx. auditory hallucinations, delusions, hyper-religious). Negative for memory loss, substance abuse and suicidal ideas. The patient has insomnia (Stable). The patient is not nervous/anxious.     Blood pressure (!) 115/38, pulse (!) 113, temperature 97.9 F (36.6 C), temperature source Oral, resp. rate 16, height 5\' 4"  (1.626 m), weight 80.7 kg (178 lb).Body mass index is 30.55 kg/m.  See Md's SRA   Have you used any form of tobacco in the last 30 days? (Cigarettes, Smokeless Tobacco, Cigars, and/or Pipes): Patient Refused Screening  Has this patient used any form of tobacco in the last 30 days? (Cigarettes, Smokeless Tobacco, Cigars, and/or Pipes): No  Blood Alcohol level:  Lab Results  Component Value Date   ETH <5 04/17/2017   Metabolic Disorder Labs:  No results found for: HGBA1C, MPG No results found for: PROLACTIN No results found for: CHOL, TRIG, HDL, CHOLHDL, VLDL, LDLCALC  See Psychiatric Specialty Exam and Suicide Risk Assessment completed by Attending Physician prior to discharge.  Discharge destination:  Home  Is patient on multiple antipsychotic therapies at discharge:  No   Has  Patient had three or more failed trials of antipsychotic monotherapy by history:  No  Recommended Plan for Multiple Antipsychotic Therapies: NA  Allergies as of 04/25/2017   No Known Allergies     Medication List    STOP  taking these medications   minocycline 100 MG capsule Commonly known as:  MINOCIN,DYNACIN     TAKE these medications     Indication  benztropine 0.5 MG tablet Commonly known as:  COGENTIN Take 1 tablet (0.5 mg total) by mouth daily as needed for tremors.  Indication:  Extrapyramidal Reaction caused by Medications   divalproex 500 MG 24 hr tablet Commonly known as:  DEPAKOTE ER Take 2 tablets (1,000 mg total) by mouth at bedtime. For mood stabilization  Indication:  Mood stabilization   LORazepam 2 MG tablet Commonly known as:  ATIVAN Take 1 tablet (2 mg total) by mouth every 6 (six) hours as needed for anxiety (agitation. Please hold for oversedation.).  Indication:  Feeling Anxious   risperiDONE 1 MG disintegrating tablet Commonly known as:  RISPERDAL M-TABS Take 1 tablet (1 mg total) by mouth 2 (two) times daily as needed (agitation).  Indication:  Agitation   risperidone 3 MG disintegrating tablet Commonly known as:  RISPERDAL M-TABS Take 1 tablet (3 mg total) by mouth at bedtime. For mood control  Indication:  Mood control      Follow-up Information    [facility] on [facility]10[facility]2017.   Why:  at 4:30pm with Dr. Jannifer FranklinAkintayo for medication management.  Contact information: [address] [place] [address] [phone]          Follow-up recommendations: Activity:  As tolerated Diet: As recommended by your primary care doctor. Keep all scheduled follow-up appointments as recommended.   Comments: Patient is instructed prior to discharge to: Take all medications as prescribed by his/her mental healthcare provider. Report any adverse effects and or reactions from the medicines to his/her outpatient provider promptly. Patient has been instructed & cautioned: To not engage in alcohol and or illegal drug use while on prescription medicines. In the event of worsening symptoms, patient is instructed to call the crisis hotline, 911 and or go to  the nearest ED for appropriate evaluation and treatment of symptoms. To follow-up with his/her primary care provider for your other medical issues, concerns and or health care needs.   Signed: Sanjuana KavaNwoko, Basil Blakesley I, NP, PMHNP, FNP-BC 04/26/2017, 10:31 AM

## 2017-04-25 NOTE — [facility] Suicide Risk Assessment (Signed)
[facility]   Principal Problem: Schizophrenia, paranoid [facility](HCC) Discharge Diagnoses:  Patient Active Problem List   Diagnosis Date Noted  . Schizophrenia, paranoid (HCC) [F20.0] 04/20/2017    Priority: High  . Acute psychosis [F23] 04/18/2017  Patient is an 21 year old female admitted under an IVC for treatment of acute psychosis. Patient is hyper religious, bizarre, preoccupied and a poor historian. She was found walking down the street naked with the sign that read heaven or hell.  Patient this morning reports that she is doing much better, states that she will attends college, adds that her family supportive. She reports her mood is good her affect is restricted but patient can engage in conversation, knows where she is, is not responding to internal stimuli, denies any hallucinations or delusions. Patient states that she plans to take her medications regularly and wants to attend college. She states that she uses headphones to listen to music as it helps with her anxiety. She denies any side effects with the medications, any thoughts of self, harm to others.  Total Time spent with patient: 30 minutes  Musculoskeletal: Strength & Muscle Tone: within normal limits Gait & Station: normal Patient leans: N/A  Psychiatric Specialty Exam: Review of Systems  Constitutional: Negative.  Negative for fever and malaise/fatigue.  HENT: Negative.  Negative for congestion, sinus pain and sore throat.   Eyes: Negative for blurred vision, double vision, discharge and redness.  Respiratory: Negative.  Negative for cough, shortness of breath and wheezing.   Cardiovascular: Negative.  Negative for chest pain and palpitations.  Gastrointestinal: Negative.  Negative for abdominal pain, constipation, diarrhea, heartburn, nausea and vomiting.  Genitourinary: Negative.  Negative for dysuria.  Musculoskeletal: Negative.  Negative for falls, joint pain and myalgias.  Skin: Negative.   Negative for rash.  Neurological: Negative.  Negative for dizziness, seizures, loss of consciousness, weakness and headaches.  Endo/Heme/Allergies: Negative.  Negative for environmental allergies.  Psychiatric/Behavioral: Negative.  Negative for depression, hallucinations, memory loss, substance abuse and suicidal ideas. The patient is not nervous/anxious and does not have insomnia.     Blood pressure (!) 115/38, pulse (!) 113, temperature 97.9 F (36.6 C), temperature source Oral, resp. rate 16, height 5\' 4"  (1.626 m), weight 80.7 kg (178 lb).Body mass index is 30.55 kg/m.  General Appearance: Casual  Eye Contact::  Fair  Speech:  Clear and Coherent and Normal Rate409  Volume:  Normal  Mood:  Depressed  Affect:  Appropriate, Congruent and Restricted  Thought Process:  Coherent, Goal Directed and Descriptions of Associations: Intact  Orientation:  Full (Time, Place, and Person)  Thought Content:  Logical and Rumination  Suicidal Thoughts:  No  Homicidal Thoughts:  No  Memory:  Immediate;   Fair Recent;   Fair Remote;   Fair  Judgement:  Intact  Insight:  Present and Shallow  Psychomotor Activity:  Normal  Concentration:  Fair  Recall:  Fis[employer]Fair  Fund of Knowledge:Fair  Language: Fair  Akathisia:  No  Handed:  Right  AIMS (if indicated):     Assets:  Desire for Improvement Housing Physical Health Social Support Transportation  Sleep:  Number of Hours: 3.75  Cognition: Impaired,  Mild  ADL's:  Intact   Mental Status Per Nursing Assessment::   On Admission:   (Unable to assess)  Demographic Factors:  Adolescent or young adult  Loss Factors: Decrease in vocational status  Historical Factors: NA  Risk Reduction Factors:   Sense of responsibility to family and Living with  another person, especially a relative  Continued Clinical Symptoms:  Previous Psychiatric Diagnoses and Treatments  Cognitive Features That Contribute To Risk:  None    Suicide Risk:  Minimal: No  identifiable suicidal ideation.  Patients presenting with no risk factors but with morbid ruminations; may be classified as minimal risk based on the severity of the depressive symptoms  Follow-up Information    [facility] Follow [facility] on 05/10/2017.   Why:  at 4:30pm with Dr. Jannifer FranklinAkintayo for medication management.  Contact information: [address] [place] [address] [phone]           Plan Of Care/Follow-up recommendations:  Activity:  As tolerated Diet:  Regular Other:  Keep follow-up appointments and take medications as prescribed  Nelly RoutKUMAR,Dontrae Morini, MD 04/25/2017, 12:25 PM

## 2017-04-25 NOTE — Progress Notes (Signed)
Recreation Therapy Notes  Date: 04/25/2017  Time: 10:00am Location: [address] Dayroom  Group Topic: Self-Esteem  Goal Area(s) Addresses:  Pt will be able to successfully create a piece of artwork that shows others who they are. Pt will be able to successfully share their license plate with peers.  Behavioral Response: Engaged  Intervention: Art  Activity: Pt will be given a blank license plate template worksheet. Pt will be asked to create a license plate that represents who they are. Pt will then share their license plate with peers.  Education: Self-Esteem, Discharge Planning  Education Outcome: Acknowledges understanding  Clinical Observations/Feedback: Pt actively participated in activity. Pt identified that "there is always a light at the end of the tunnel," and there is always a way to reach your goals. Pt identified that it can be difficult to focus on the positive. Pt left during the processing discussion and did not return.  Marvell Fullerachel Meyer, Recreation Therapy Intern  Caroll RancherMarjette Kadarius Cuffe, LRT/CTRS

## 2017-04-25 NOTE — Progress Notes (Signed)
Recreation Therapy Notes  INPATIENT RECREATION TR PLAN  Patient Details Name: Lindsey Richmond MRN: [id number] DOB: 1995-11-29 Today's Date: 04/25/2017  Rec Therapy Plan Is patient appropriate for Therapeutic Recreation?: Yes Treatment times per week: about 3 days Estimated Length of Stay: 5-7 days TR Treatment/Interventions: Group participation (Comment)  Discharge Criteria Pt will be discharged from therapy if:: Discharged Treatment plan/goals/alternatives discussed and agreed upon by:: Patient/family  Discharge Summary Short term goals set: Patient will attend and participate in Recreation Therapy Group Sessions  Short term goals met: Complete Progress toward goals comments: Groups attended Which groups?: Self-esteem, Coping skills, Other (Comment) (Exercise) Reason goals not met: None Therapeutic equipment acquired: N/A Reason patient discharged from therapy: Discharge from hospital Pt/family agrees with progress & goals achieved: Yes Date patient discharged from therapy: 04/25/17    Victorino Sparrow, LRT/CTRS  [name], Maddisyn Hegwood A 04/25/2017, 1:19 PM

## 2017-04-25 NOTE — Plan of Care (Signed)
Problem: [facility] Participation in Recreation Therapeutic Interventions Goal: STG-Patient will attend/participate in Rec Therapy Group Ses STG-The Patient will attend and participate in Recreation Therapy Group Sessions  Outcome: Completed/Met Date Met: 04/25/17 Pt attended and participated in coping skills, exercise and self-esteem recreation therapy sessions.  Victorino Sparrow, LRT/CTRS

## 2017-04-25 NOTE — Progress Notes (Signed)
  [facility] :  Will you be returning to the same living situation after discharge:  Yes,  Pt returning home with family At discharge, do you have transportation home?: Yes,  family to pick up Do you have the ability to pay for your medications: Yes,  Pt provided with prescriptions  Release of information consent forms completed and in the chart;  Patient's signature needed at discharge.  Patient to Follow up at: Follow-up Information    [facility] on 05/10/2017.   Why:  at 4:30pm with Dr. Jannifer FranklinAkintayo for medication management.  Contact information: [address] [place] [address] [phone]           Next level of care provider [facility] Link:no  Safety Planning and Suicide Prevention discussed: Yes,  with father; see SPE note  Have you used any form of tobacco in the last 30 days? (Cigarettes, Smokeless Tobacco, Cigars, and/or Pipes): Patient Refused Screening  Has patient been referred to the Quitline?: Patient refused referral  Patient has been referred for addiction treatment: Yes  Verdene LennertLauren C Joanann Mies 04/25/2017, 9:32 AM

## 2017-04-25 NOTE — [facility] Group Notes (Signed)
[facility] Group Notes:  (Nursing/MHT/Case Management/Adjunct)  Date:  04/25/2017  Time:  4:06 PM  Type of Therapy:  Nurse Education  Participation Level:  Active  Participation Quality:  Attentive  Affect:  Flat  Cognitive:  Alert and Oriented  Insight:  Limited  Engagement in Group:  Engaged  Modes of Intervention:  Discussion, Education and Rapport Building  Summary of Progress/Problems: Pt attentive, offered feedback in healthy supports group. Able to verbalize support persons.   Harvel QualeMardis, Oveda Dadamo 04/25/2017, 4:06 PM

## 2017-05-05 ENCOUNTER — Encounter (HOSPITAL_COMMUNITY): Payer: Self-pay | Admitting: *Deleted

## 2017-05-05 ENCOUNTER — Emergency Department (HOSPITAL_COMMUNITY)
Admission: EM | Admit: 2017-05-05 | Discharge: 2017-05-07 | Disposition: A | Discharge status: Home / Self Care | Payer: Federal, State, Local not specified - Other | Attending: Emergency Medicine | Admitting: Emergency Medicine

## 2017-05-05 DIAGNOSIS — F200 Paranoid schizophrenia: Secondary | ICD-10-CM | POA: Diagnosis present

## 2017-05-05 DIAGNOSIS — F29 Unspecified psychosis not due to a substance or known physiological condition: Secondary | ICD-10-CM | POA: Insufficient documentation

## 2017-05-05 DIAGNOSIS — F209 Schizophrenia, unspecified: Secondary | ICD-10-CM | POA: Insufficient documentation

## 2017-05-05 DIAGNOSIS — F23 Brief psychotic disorder: Secondary | ICD-10-CM

## 2017-05-05 LAB — RAPID URINE DRUG SCREEN, HOSP PERFORMED
Amphetamines: NOT DETECTED
BARBITURATES: NOT DETECTED
Benzodiazepines: POSITIVE — AB
Cocaine: NOT DETECTED
Opiates: NOT DETECTED
TETRAHYDROCANNABINOL: NOT DETECTED

## 2017-05-05 LAB — COMPREHENSIVE METABOLIC PANEL
ALBUMIN: 3.8 g/dL (ref 3.5–5.0)
ALK PHOS: 58 U/L (ref 38–126)
ALT: 14 U/L (ref 14–54)
AST: 20 U/L (ref 15–41)
Anion gap: 8 (ref 5–15)
BUN: 16 mg/dL (ref 6–20)
CALCIUM: 9 mg/dL (ref 8.9–10.3)
CO2: 28 mmol/L (ref 22–32)
CREATININE: 0.83 mg/dL (ref 0.44–1.00)
Chloride: 102 mmol/L (ref 101–111)
GFR calc Af Amer: >60 mL/min (ref >60)
GFR calc non Af Amer: >60 mL/min (ref >60)
GLUCOSE: 88 mg/dL (ref 65–99)
Potassium: 3.9 mmol/L (ref 3.5–5.1)
SODIUM: 138 mmol/L (ref 135–145)
Total Bilirubin: 0.5 mg/dL (ref 0.3–1.2)
Total Protein: 8.3 g/dL — ABNORMAL HIGH (ref 6.5–8.1)

## 2017-05-05 LAB — CBC
HEMATOCRIT: 36.9 % (ref 36.0–46.0)
HEMOGLOBIN: 12.7 g/dL (ref 12.0–15.0)
MCH: 31.4 pg (ref 26.0–34.0)
MCHC: 34.4 g/dL (ref 30.0–36.0)
MCV: 91.1 fL (ref 78.0–100.0)
Platelets: 227 10*3/uL (ref 150–400)
RBC: 4.05 MIL/uL (ref 3.87–5.11)
RDW: 13.3 % (ref 11.5–15.5)
WBC: 6.5 10*3/uL (ref 4.0–10.5)

## 2017-05-05 LAB — I-STAT BETA HCG BLOOD, ED (MC, WL, AP ONLY): I-stat hCG, quantitative: <5 m[IU]/mL (ref <5)

## 2017-05-05 LAB — SALICYLATE LEVEL: Salicylate Lvl: <7 mg/dL (ref 2.8–30.0)

## 2017-05-05 LAB — VALPROIC ACID LEVEL: Valproic Acid Lvl: 78 ug/mL (ref 50.0–100.0)

## 2017-05-05 LAB — ACETAMINOPHEN LEVEL: Acetaminophen (Tylenol), Serum: <10 ug/mL — ABNORMAL LOW (ref 10–30)

## 2017-05-05 LAB — ETHANOL: Alcohol, Ethyl (B): <5 mg/dL (ref <5)

## 2017-05-05 MED ORDER — RISPERIDONE 1 MG PO TBDP
1.0000 mg | ORAL_TABLET | Freq: Two times a day (BID) | ORAL | Status: DC | PRN
  Administered 2017-05-05: 1 mg via ORAL
  Filled 2017-05-05: qty 1

## 2017-05-05 MED ORDER — BENZTROPINE MESYLATE 0.5 MG PO TABS: 0.5000 mg | ORAL_TABLET | Freq: Every day | ORAL | Status: DC | PRN

## 2017-05-05 MED ORDER — LORAZEPAM 1 MG PO TABS
2.0000 mg | ORAL_TABLET | Freq: Four times a day (QID) | ORAL | Status: DC | PRN
  Administered 2017-05-06: 2 mg via ORAL
  Filled 2017-05-05: qty 2

## 2017-05-05 MED ORDER — DIVALPROEX SODIUM ER 500 MG PO TB24
1000.0000 mg | ORAL_TABLET | Freq: Every day | ORAL | Status: DC
  Administered 2017-05-05: 1000 mg via ORAL
  Filled 2017-05-05: qty 2

## 2017-05-05 MED ORDER — RISPERIDONE 1 MG PO TBDP
3.0000 mg | ORAL_TABLET | Freq: Every day | ORAL | Status: DC
  Administered 2017-05-05: 3 mg via ORAL
  Filled 2017-05-05: qty 3

## 2017-05-05 NOTE — ED Notes (Signed)
Hourly rounding reveals patient sitting in room. No complaints, stable, in no acute distress. Q15 minute rounds and monitoring via [employer] to continue.

## 2017-05-05 NOTE — ED Notes (Signed)
Pt. still intermitantly responding to internal stimuli.

## 2017-05-05 NOTE — BH Assessment (Addendum)
Assessment Note  Lindsey FileMaiya Richmond is an 21 y.o. female that presents this date actively psychotic. Patient is religiously fixed and is yelling "repent" over and over. The presence of this writer in the room is agitating patient as her chanting becomes louder. Patient is UTA and cannot be redirected. Information for purposes of assessment were gathered from admission notes and previous history. Patient seems to be responding to internal stimuli. Per notes, patient has a history of schizophrenia and acute psychosis who presents today with complaint of psychosis and hyperreligious thoughts. When asked what brought her to the ED, she states "Jesus Christ is perfect. You must repent, you must repent, you must repent", which she continued to repeat. When asked if she takes his medications regularly, she states "off and on, but not really". When asked about suicidal or homicidal ideation she says "I do not want to hurt anybody. Jesus is perfect and he will judge me". When asked about auditory or visual hallucinations she says "I prefer not to answer the question". Per note review [facility] and was assessed being actively psychotic at that time also. Per notes, patient recevies services from Akintayo MD. Case was staffed with Rankin NP who recommended a inpatient admission. Patient initially presented voluntary.  EDP initiated IVC.   Diagnosis: Unspecified schizophrenia spectrum and other psychotic disorder (per notes)  Past Medical History: No past medical history on file.  No past surgical history on file.  Family History:  Family History  Problem Relation Age of Onset  . Heart failure Mother   . Hypertension Mother   . Diabetes Father     Social History:  reports that she does not drink alcohol or use drugs. Her tobacco history is not on file.  Additional Social History:  Alcohol / Drug Use Pain Medications: See MAR Prescriptions: See MAR Over the Counter: See   MAR History of alcohol / drug use?: No history of alcohol / drug abuse Longest period of sobriety (when/how long):  (NA) Negative Consequences of Use:  (NA) Withdrawal Symptoms:  (NA)  CIWA: CIWA-Ar BP: (!) 143/75 Pulse Rate: 77 COWS:    Allergies: No Known Allergies  Home Medications:  (Not in a hospital admission)  OB/GYN Status:  No LMP recorded (lmp unknown).  General Assessment Data [facility] of Assessment: [facility] TTS Assessment: In system Is this a Tele or Face-to-Face Assessment?: Face-to-Face Is this an Initial Assessment or a Re-assessment for this encounter?: Initial Assessment Marital status: Single Maiden name: NA Is patient pregnant?: Unknown Pregnancy Status: Unknown Living Arrangements:  (UTA) Can pt return to current living arrangement?:  (UTA) Admission Status: Involuntary Is patient capable of signing voluntary admission?: No Referral Source: Self/Family/Friend Insurance type: Medicaid  Medical Screening Exam [facility] Walk-[facility] ONLY) Medical Exam completed: Yes  Crisis Care Plan Living Arrangements:  (UTA) Legal Guardian:  (UTA) Name of Psychiatrist: Akintayo MD (Per record review) Name of Therapist: Christell ConstantMoore MD (Per note review)  Education Status Is patient currently in school?:  (UTA) Current Grade:  (UTA) Highest grade of school patient has completed:  (UTA) Name of school:  (UTA) Contact person:  (UTA)  Risk to self with the past 6 months Suicidal Ideation:  (UTA) Has patient been a risk to self within the past 6 months prior to admission? :  (UTA) Suicidal Intent:  (UTA) Has patient had any suicidal intent within the past 6 months prior to admission? :  (UTA) Is patient at risk for suicide?:  (  UTA) Suicidal Plan?:  (UTA) Has patient had any suicidal plan within the past 6 months prior to admission? :  (UTA) Access to Means:  (UTA) What has been your use of drugs/alcohol within the last 12 months?: Denies current use Previous Attempts/Gestures:   (UTA) How many times?:  (UTA) Other Self Harm Risks:  (UTA) Triggers for Past Attempts: Unknown Intentional Self Injurious Behavior:  (UTA) Family Suicide History:  (UTA) Recent stressful life event(s):  (UTA) Persecutory voices/beliefs?:  [name](UTA) Depression:  (UTA) Depression Symptoms:  (UTA) Substance abuse history and/or treatment for substance abuse?:  (UTA) Suicide prevention information given to non-admitted patients:  (UTA)  Risk to Others within the past 6 months Homicidal Ideation:  (UTA) Does patient have any lifetime risk of violence toward others beyond the six months prior to admission? :  (UTA) Thoughts of Harm to Others:  (UTA) Current Homicidal Intent:  (UTA) Current Homicidal Plan:  (UTA) Access to Homicidal Means:  (UTA) Identified Victim:  (UTA) History of harm to others?:  (UTA) Assessment of Violence:  (UTA) Violent Behavior Description:  (UTA) Does patient have access to weapons?:  (UTA) Criminal Charges Pending?:  (UTA) Does patient have a court date:  (UTA) Is patient on probation?:  (UTA)  Psychosis Hallucinations: Auditory Delusions: None noted  Mental Status Report Appearance/Hygiene: In scrubs Eye Contact: Poor Motor Activity: Agitation Speech: Elective mutism, Tangential Level of Consciousness: Irritable Mood: Anxious Affect: Fearful Anxiety Level: Severe Thought Processes: Unable to Assess Judgement: Unable to Assess Orientation: Unable to assess Obsessive Compulsive Thoughts/Behaviors: Unable to Assess  Cognitive Functioning Concentration: Unable to Assess Memory: Unable to Assess IQ:  (UTA) Insight: Unable to Assess Impulse Control: Unable to Assess Appetite:  (UTA) Weight Loss:  (UTA) Weight Gain:  (UTA) Sleep:  (UTA) Total Hours of Sleep:  (UTA) Vegetative Symptoms:  (UTA)  ADLScreening [facility] Assessment Services) Patient's cognitive ability adequate to safely complete daily activities?: No Patient able to express need for  assistance with ADLs?: Yes Independently performs ADLs?: Yes (appropriate for developmental age)  Prior Inpatient Therapy Prior Inpatient Therapy: Yes Prior Therapy Dates: 2018 [facility] Reason for Treatment: MH issues  Prior Outpatient Therapy Prior Outpatient Therapy: Yes Prior Therapy Dates:  (Per notes [facility] 2018) Prior Therapy Facilty/Provider(s): [facility] Reason for Treatment: MH issues Does patient have an ACCT team?: Unknown Does patient have Intensive In-House Services?  : Unknown Does patient have [facility] services? : Yes ([facility] per notes) Does patient have P4[facility] services?: No  ADL Screening (condition at time of admission) Patient's cognitive ability adequate to safely complete daily activities?: No Is the patient deaf or have difficulty hearing?: No Does the patient have difficulty seeing, even when wearing glasses/contacts?: No Does the patient have difficulty concentrating, remembering, or making decisions?: Yes Patient able to express need for assistance with ADLs?: Yes Does the patient have difficulty dressing or bathing?: No Independently performs ADLs?: Yes (appropriate for developmental age) Does the patient have difficulty walking or climbing stairs?: No Weakness of Legs: None Weakness of Arms/Hands: None  Home Assistive Devices/Equipment Home Assistive Devices/Equipment: None  Therapy Consults (therapy consults require a physician order) PT Evaluation Needed: No OT Evalulation Needed: No SLP Evaluation Needed: No Abuse/Neglect Assessment (Assessment to be complete while patient is alone) Physical Abuse: Denies Verbal Abuse: Denies Sexual Abuse: Denies Exploitation of patient/patient's resources: Denies Self-Neglect: Denies Values / Beliefs Cultural Requests During Hospitalization: None Spiritual Requests During Hospitalization: None Consults Spiritual Care Consult Needed: No Social Work Consult Needed: No  Advance  Directives (For Healthcare) Does Patient Have a Medical Advance Directive?: No Would patient like information on creating a medical advance directive?: No - Patient declined    Additional Information 1:1 In Past 12 Months?:  (UTA) CIRT Risk: Yes (UTA) Elopement Risk: Yes Does patient have medical clearance?: Yes     Disposition: Case was staffed with Rankin NP who recommended a inpatient admission. Patient initially presented voluntary.  EDP initiated IVC.  Disposition Initial Assessment Completed for this Encounter: Yes Disposition of Patient: Inpatient treatment program Type of inpatient treatment program: Adult  On Site Evaluation by:   Reviewed with Physician:    Alfredia Ferguson 05/05/2017 4:37 PM

## 2017-05-05 NOTE — ED Provider Notes (Signed)
[facility]-EMERGENCY DEPT Provider Note   CSN: [id number] Arrival date & time: 05/05/17  1221     History   Chief Complaint Chief Complaint  Patient presents with  . Medical Clearance    suicidal ideation    HPI [name] is a 21 y.o. female With history of schizophrenia and acute psychosis who presents today with complaint of psychosis and hyperreligious thoughts. When asked what brought her to the ED, she states "Jesus Christ is perfect. You must repent, you must repent, you must repent", which she continued to repeat. When asked if she takes his medications regularly, she states "off and on, but not really". When asked about suicidal or homicidal ideation she says "I do not want to hurt anybody. Jesus is perfect and he will judge me". When asked about auditory or visual hallucinations she says "I prefer not to answer the question".  she denies any medical complaints at this time.  The history is provided by the patient.    No past medical history on file.  Patient Active Problem List   Diagnosis Date Noted  . Schizophrenia, paranoid (HCC) 04/20/2017  . Acute psychosis 04/18/2017    No past surgical history on file.  OB History    Gravida Para Term Preterm AB Living   0 0 0 0 0 0   SAB TAB Ectopic Multiple Live Births   0 0 0 0 0       Home Medications    Prior to Admission medications   Medication Sig Start Date End Date Taking? Authorizing Provider  benztropine (COGENTIN) 0.5 MG tablet Take 1 tablet (0.5 mg total) by mouth daily as needed for tremors. 04/25/17   Armandina StammerNwoko, Agnes I, NP  divalproex (DEPAKOTE ER) 500 MG 24 hr tablet Take 2 tablets (1,000 mg total) by mouth at bedtime. For mood stabilization 04/25/17   Nwoko, Nicole KindredAgnes I, NP  LORazepam (ATIVAN) 2 MG tablet Take 1 tablet (2 mg total) by mouth every 6 (six) hours as needed for anxiety (agitation. Please hold for oversedation.). 04/25/17   Armandina StammerNwoko, Agnes I, NP  risperiDONE (RISPERDAL M-TABS) 1 MG disintegrating tablet Take  1 tablet (1 mg total) by mouth 2 (two) times daily as needed (agitation). 04/25/17   Armandina StammerNwoko, Agnes I, NP  risperiDONE (RISPERDAL M-TABS) 3 MG disintegrating tablet Take 1 tablet (3 mg total) by mouth at bedtime. For mood control 04/25/17   Sanjuana KavaNwoko, Agnes I, NP    Family History Family History  Problem Relation Age of Onset  . Heart failure Mother   . Hypertension Mother   . Diabetes Father     Social History Social History  Substance Use Topics  . Smoking status: Unknown If Ever Smoked  . Smokeless tobacco: Not on file  . Alcohol use No     Allergies   Patient has no known allergies.   Review of Systems Review of Systems  Respiratory: Negative for shortness of breath.   Cardiovascular: Negative for chest pain.  Gastrointestinal: Negative for abdominal pain.  Psychiatric/Behavioral: Positive for hallucinations.     Physical Exam Updated Vital Signs BP (!) 143/75 (BP Location: Left Arm)   Pulse 77   Temp 97.6 F (36.4 C) (Oral)   Ht 5\' 4"  (1.626 m)   Wt 86.6 kg (191 lb)   LMP  (LMP Unknown)   SpO2 100%   BMI 32.79 kg/m   Physical Exam  Constitutional: She is oriented to person, place, and time. She appears well-developed and well-nourished. No distress.  HENT:  Head: Normocephalic and atraumatic.  Eyes: Pupils are equal, round, and reactive to light.  Neck: Normal range of motion. Neck supple. Tracheal deviation present.  Cardiovascular: Normal rate and regular rhythm.   Pulmonary/Chest: Effort normal and breath sounds normal.  Abdominal: Soft. Bowel sounds are normal. She exhibits no distension.  Musculoskeletal: She exhibits no edema.  Neurological: She is alert and oriented to person, place, and time.  Fluent speech, no facial droop, answers questions slowly but somewhat appropriately  Skin: Skin is warm and dry. Capillary refill takes less than 2 seconds. She is not diaphoretic.  Psychiatric: Her affect is blunt. Her speech is delayed. She is withdrawn.  Thought content is delusional. She expresses inappropriate judgment.  Staring blankly ahead, rarely makes eye contact. Slow to answer questions. Waxy flexibility present. Appears to be responding to internal stimuli     ED Treatments / Results  Labs (all labs ordered are listed, but only abnormal results are displayed) Labs Reviewed  COMPREHENSIVE METABOLIC PANEL - Abnormal; Notable for the following:       Result Value   Total Protein 8.3 (*)    All other components within normal limits  ACETAMINOPHEN LEVEL - Abnormal; Notable for the following:    Acetaminophen (Tylenol), Serum <10 (*)    All other components within normal limits  RAPID URINE DRUG SCREEN, HOSP PERFORMED - Abnormal; Notable for the following:    Benzodiazepines POSITIVE (*)    All other components within normal limits  ETHANOL  SALICYLATE LEVEL  CBC  VALPROIC ACID LEVEL  I-STAT BETA HCG BLOOD, ED ([facility], [facility], [facility] ONLY)    EKG  EKG Interpretation  Date/Time:  Saturday May 05 2017 14:44:08 EDT Ventricular Rate:  78 PR Interval:  182 QRS Duration: 82 QT Interval:  394 QTC Calculation: 449 R Axis:   63 Text Interpretation:  Normal sinus rhythm Nonspecific ST abnormality Abnormal ECG Confirmed by Geoffery LyonseLo, Douglas ([id number]) on 05/05/2017 2:58:23 PM       Radiology No results found.  Procedures Procedures (including critical care time)  Medications Ordered in ED Medications - No data to display   Initial Impression / Assessment and Plan / ED Course  I have reviewed the triage vital signs and the nursing notes.  Pertinent labs & imaging results that were available during my care of the patient were reviewed by me and considered in my medical decision making (see chart for details).     Patient presents and appears to be acutely psychotic. Afebrile, vital signs are stable. No prolonged QT. UDS positive for benzodiazepines which is consistent with her prescribed Ativan. She is medically cleared from our  standpoint. Per counselor recommendation, will IVC. Final Clinical Impressions(s) / ED Diagnoses   Final diagnoses:  Acute psychosis    New Prescriptions New Prescriptions   No medications on file     Bennye AlmFawze, Hollyn Stucky A, PA-C 05/05/17 1628    Geoffery Lyonselo, Douglas, MD 05/06/17 [phone]

## 2017-05-05 NOTE — ED Notes (Signed)
Hourly rounding reveals patient sitting room responding to internal stimuli. No complaints, stable, in no acute distress. Q15 minute rounds and monitoring via [employer] to continue.

## 2017-05-05 NOTE — ED Notes (Signed)
Hourly rounding reveals patient sleeping in room. No complaints, stable, in no acute distress. Q15 minute rounds and monitoring via Security Cameras to continue. 

## 2017-05-05 NOTE — BH Assessment (Signed)
[facility] Assessment Progress Note  Case was staffed with Rankin NP who recommended a inpatient admission. Patient initially presented voluntary.  EDP initiated IVC.

## 2017-05-05 NOTE — ED Notes (Signed)
[facility] psych ed. Patient transferred to room 43

## 2017-05-05 NOTE — ED Triage Notes (Signed)
Patient brought in by  EMS whom patient [place] EMS states staff called because patient was found to be  manic and hyper religious. Patient presents with hyper religous thoughts. Patient states repeatedly.. " I love you Jesus, I need help I do not want to sin".

## 2017-05-05 NOTE — ED Notes (Signed)
Report to include Situation, Background, Assessment, and Recommendations [facility]. Patient alert, warm and dry, in no acute distress. Patient denies SI, HI, AVH and pain. Patient made aware of Q15 minute rounds and security cameras for their safety. Patient instructed to come to me with needs or concerns. Pt. Appears to be responding to internal stimuli.

## 2017-05-05 NOTE — Progress Notes (Signed)
Pt ambulatory to [facility] with a steady gait. Denies SI, HI, AVH and pain when assessed. Observed awake in bed rocking back and forth with hands on her ears "turn off the TV" and appears to be responding to internal stimuli at present. Also noted smiling at the ceiling. Per pt "I'm here because I was professing Jesus; Jesus loves you, repent, He's coming soon, repent". Per report pt has a h/o Undifferentiated Schizophrenia. Remains verbally redirectable at this time. Emotional support and encouragement provided to pt. Q 15 minutes safety checks maintained without self harm gestures.

## 2017-05-05 NOTE — ED Notes (Signed)
Pt. Up to restroom. Asking what time it is and when she will be discharges. Pt. Made aware of time and sequence of events over the next few hours. Then back to bed.

## 2017-05-06 ENCOUNTER — Encounter (HOSPITAL_COMMUNITY): Payer: Self-pay | Admitting: Registered Nurse

## 2017-05-06 DIAGNOSIS — G4700 Insomnia, unspecified: Secondary | ICD-10-CM

## 2017-05-06 DIAGNOSIS — F29 Unspecified psychosis not due to a substance or known physiological condition: Secondary | ICD-10-CM

## 2017-05-06 DIAGNOSIS — R451 Restlessness and agitation: Secondary | ICD-10-CM

## 2017-05-06 DIAGNOSIS — F200 Paranoid schizophrenia: Secondary | ICD-10-CM

## 2017-05-06 MED ORDER — RISPERIDONE 1 MG PO TBDP
2.0000 mg | ORAL_TABLET | Freq: Two times a day (BID) | ORAL | Status: DC
  Administered 2017-05-06 – 2017-05-07 (×2): 2 mg via ORAL
  Filled 2017-05-06 (×2): qty 2

## 2017-05-06 MED ORDER — BENZTROPINE MESYLATE 0.5 MG PO TABS
0.5000 mg | ORAL_TABLET | Freq: Two times a day (BID) | ORAL | Status: DC
  Administered 2017-05-06 – 2017-05-07 (×3): 0.5 mg via ORAL
  Filled 2017-05-06 (×3): qty 1

## 2017-05-06 MED ORDER — TRAZODONE HCL 50 MG PO TABS
50.0000 mg | ORAL_TABLET | Freq: Every day | ORAL | Status: DC
  Administered 2017-05-06: 50 mg via ORAL
  Filled 2017-05-06: qty 1

## 2017-05-06 MED ORDER — ASENAPINE MALEATE 5 MG SL SUBL: 5.0000 mg | SUBLINGUAL_TABLET | Freq: Two times a day (BID) | SUBLINGUAL | Status: DC | PRN

## 2017-05-06 NOTE — ED Notes (Signed)
Hourly rounding reveals patient sleeping in room. No complaints, stable, in no acute distress. Q15 minute rounds and monitoring via Security Cameras to continue. 

## 2017-05-06 NOTE — ED Notes (Signed)
Pt. Repeatedly coming out of her room loudly saying"repent,repent,repent". Redirection only briefly effective.

## 2017-05-06 NOTE — ED Notes (Signed)
Hourly rounding reveals patient in room. No complaints, stable, in no acute distress. Q15 minute rounds and monitoring via Security Cameras to continue. 

## 2017-05-06 NOTE — ED Notes (Signed)
Report to include Situation, Background, Assessment, and Recommendations received from Edie RN. Patient alert, warm and dry, in no acute distress. Patient denies SI, HI, AVH and pain. Patient made aware of Q15 minute rounds and security cameras for their safety. Patient instructed to come to me with needs or concerns. 

## 2017-05-06 NOTE — Progress Notes (Signed)
Pt has been faxed to the following inpt facilities for possible admission:  [facility], [facility], [facility], [facility], [facility], [facility], [facility], [facility]  Princess BruinsAquicha Arbie Blankley, MSW, [employer] TTS Specialist [phone]

## 2017-05-06 NOTE — Consult Note (Signed)
[facility] Consult   Reason for Consult:  Psychosis Referring Physician:  EDP Patient Identification: Lindsey Richmond MRN:  [id number] Principal Diagnosis: Schizophrenia, paranoid ([place]) Diagnosis:   Patient Active Problem List   Diagnosis Date Noted  . Schizophrenia, paranoid ([place]) [F20.0] 04/20/2017  . Acute psychosis [F23] 04/18/2017    Total Time spent with patient: 45 minutes  Subjective:   Lindsey Richmond is a 21 y.o. female patient presented to [facility] with psychosis .  HPI:  patient seen by Dr. Darleene Cleaver and this provider.  Chart reviewed and face to face evaluation on 05/06/17.   On evaluation:  Meily Glowacki reports that she was brought to the hospital because "I was professing to [facility].  Why is that a bad thing?  The end is near; I was telling everyone to repent."  Patient reports that she has been taking her medications but they make her sleepy during the day.  Patient denies suicidal/homicidal ideation; drug/alcohol abuse..  Patient presents as delusional, and hyper religious with flat affect.  Yelling out loud " You need to repent the end is near."     Past Psychiatric History: Was discharged from [facility] 04/25/17 and referred to [place] for medication management.    Risk to Self: Suicidal Ideation:  (UTA) Suicidal Intent:  (UTA) Is patient at risk for suicide?:  (UTA) Suicidal Plan?:  (UTA) Access to Means:  (UTA) What has been your use of drugs/alcohol within the last 12 months?: Denies current use How many times?:  (UTA) Other Self Harm Risks:  (UTA) Triggers for Past Attempts: Unknown Intentional Self Injurious Behavior:  (UTA) Risk to Others: Homicidal Ideation:  (UTA) Thoughts of Harm to Others:  (UTA) Current Homicidal Intent:  (UTA) Current Homicidal Plan:  (UTA) Access to Homicidal Means:  (UTA) Identified Victim:  (UTA) History of harm to others?:  (UTA) Assessment of Violence:  (UTA) Violent Behavior Description:  (UTA) Does  patient have access to weapons?:  (UTA) Criminal Charges Pending?:  (UTA) Does patient have a court date:  (UTA) Prior Inpatient Therapy: Prior Inpatient Therapy: Yes Prior Therapy Dates: 2018 Prior Therapy Facilty/Provider(s): [facility] Reason for Treatment: MH issues Prior Outpatient Therapy: Prior Outpatient Therapy: Yes Prior Therapy Dates:  (Per notes [facility] 2018) Prior Therapy Facilty/Provider(s): [facility] Reason for Treatment: MH issues Does patient have an ACCT team?: Unknown Does patient have Intensive In-House Services?  : Unknown Does patient have [facility] services? : Yes ([facility] per notes) Does patient have [facility] services?: No  Past Medical History: History reviewed. No pertinent past medical history. History reviewed. No pertinent surgical history. Family History:  Family History  Problem Relation Age of Onset  . Heart failure Mother   . Hypertension Mother   . Diabetes Father    Family Psychiatric  History: Unaware Social History:  History  Alcohol Use No     History  Drug Use No    Social History   Social History  . Marital status: Single    Spouse name: N/A  . Number of children: N/A  . Years of education: N/A   Social History Main Topics  . Smoking status: Unknown If Ever Smoked  . Smokeless tobacco: None  . Alcohol use No  . Drug use: No  . Sexual activity: Yes    Birth control/ protection: Pill   Other Topics Concern  . None   Social History Narrative  . None   Additional Social History:    Allergies:  No Known Allergies  Labs:  Results for orders placed or performed during the hospital encounter of 05/05/17 (from the past 48 hour(s))  Comprehensive metabolic panel     Status: Abnormal   Collection Time: 05/05/17  1:50 PM  Result Value Ref Range   Sodium 138 135 - 145 mmol/L   Potassium 3.9 3.5 - 5.1 mmol/L   Chloride 102 101 - 111 mmol/L   CO2 28 22 - 32 mmol/L   Glucose, Bld 88 65 - 99 mg/dL   BUN 16 6 - 20 mg/dL   Creatinine, Ser  0.83 0.44 - 1.00 mg/dL   Calcium 9.0 8.9 - 10.3 mg/dL   Total Protein 8.3 (H) 6.5 - 8.1 g/dL   Albumin 3.8 3.5 - 5.0 g/dL   AST 20 15 - 41 U/L   ALT 14 14 - 54 U/L   Alkaline Phosphatase 58 38 - 126 U/L   Total Bilirubin 0.5 0.3 - 1.2 mg/dL   GFR calc non Af Amer >60 >60 mL/min   GFR calc Af Amer >60 >60 mL/min    Comment: (NOTE) The eGFR has been calculated using the CKD EPI equation. This calculation has not been validated in all clinical situations. eGFR's persistently <60 mL/min signify possible Chronic Kidney Disease.    Anion gap 8 5 - 15  Ethanol     Status: None   Collection Time: 05/05/17  1:50 PM  Result Value Ref Range   Alcohol, Ethyl (B) <5 <5 mg/dL    Comment:        LOWEST DETECTABLE LIMIT FOR SERUM ALCOHOL IS 5 mg/dL FOR MEDICAL PURPOSES ONLY   Salicylate level     Status: None   Collection Time: 05/05/17  1:50 PM  Result Value Ref Range   Salicylate Lvl <5.0 2.8 - 30.0 mg/dL  Acetaminophen level     Status: Abnormal   Collection Time: 05/05/17  1:50 PM  Result Value Ref Range   Acetaminophen (Tylenol), Serum <10 (L) 10 - 30 ug/mL    Comment:        THERAPEUTIC CONCENTRATIONS VARY SIGNIFICANTLY. A RANGE OF 10-30 ug/mL MAY BE AN EFFECTIVE CONCENTRATION FOR MANY PATIENTS. HOWEVER, SOME ARE BEST TREATED AT CONCENTRATIONS OUTSIDE THIS RANGE. ACETAMINOPHEN CONCENTRATIONS >150 ug/mL AT 4 HOURS AFTER INGESTION AND >50 ug/mL AT 12 HOURS AFTER INGESTION ARE OFTEN ASSOCIATED WITH TOXIC REACTIONS.   cbc     Status: None   Collection Time: 05/05/17  1:50 PM  Result Value Ref Range   WBC 6.5 4.0 - 10.5 K/uL   RBC 4.05 3.87 - 5.11 MIL/uL   Hemoglobin 12.7 12.0 - 15.0 g/dL   HCT 36.9 36.0 - 46.0 %   MCV 91.1 78.0 - 100.0 fL   MCH 31.4 26.0 - 34.0 pg   MCHC 34.4 30.0 - 36.0 g/dL   RDW 13.3 11.5 - 15.5 %   Platelets 227 150 - 400 K/uL  Valproic acid level     Status: None   Collection Time: 05/05/17  1:50 PM  Result Value Ref Range   Valproic Acid Lvl 78  50.0 - 100.0 ug/mL  I-Stat beta hCG blood, ED     Status: None   Collection Time: 05/05/17  1:58 PM  Result Value Ref Range   I-stat hCG, quantitative <5.0 <5 mIU/mL   Comment 3            Comment:   GEST. AGE      CONC.  (mIU/mL)   <=1 WEEK  5 - 50     2 WEEKS       50 - 500     3 WEEKS       100 - 10,000     4 WEEKS     1,000 - 30,000        FEMALE AND NON-PREGNANT FEMALE:     LESS THAN 5 mIU/mL   Rapid urine drug screen (hospital performed)     Status: Abnormal   Collection Time: 05/05/17  2:31 PM  Result Value Ref Range   Opiates NONE DETECTED NONE DETECTED   Cocaine NONE DETECTED NONE DETECTED   Benzodiazepines POSITIVE (A) NONE DETECTED   Amphetamines NONE DETECTED NONE DETECTED   Tetrahydrocannabinol NONE DETECTED NONE DETECTED   Barbiturates NONE DETECTED NONE DETECTED    Comment:        DRUG SCREEN FOR MEDICAL PURPOSES ONLY.  IF CONFIRMATION IS NEEDED FOR ANY PURPOSE, NOTIFY LAB WITHIN 5 DAYS.        LOWEST DETECTABLE LIMITS FOR URINE DRUG SCREEN Drug Class       Cutoff (ng/mL) Amphetamine      1000 Barbiturate      200 Benzodiazepine   314 Tricyclics       970 Opiates          300 Cocaine          300 THC              50     Current Facility-Administered Medications  Medication Dose Route Frequency Provider Last Rate Last Dose  . benztropine (COGENTIN) tablet 0.5 mg  0.5 mg Oral Daily PRN Tegeler, Gwenyth Allegra, MD      . divalproex (DEPAKOTE ER) 24 hr tablet 1,000 mg  1,000 mg Oral QHS Tegeler, Gwenyth Allegra, MD   1,000 mg at 05/05/17 2127  . LORazepam (ATIVAN) tablet 2 mg  2 mg Oral Q6H PRN Tegeler, Gwenyth Allegra, MD   2 mg at 05/06/17 0104  . risperiDONE (RISPERDAL M-TABS) disintegrating tablet 1 mg  1 mg Oral BID PRN Tegeler, Gwenyth Allegra, MD   1 mg at 05/05/17 1912  . risperiDONE (RISPERDAL M-TABS) disintegrating tablet 3 mg  3 mg Oral QHS Tegeler, Gwenyth Allegra, MD   3 mg at 05/05/17 2127   Current Outpatient Prescriptions  Medication Sig  Dispense Refill  . benztropine (COGENTIN) 0.5 MG tablet Take 1 tablet (0.5 mg total) by mouth daily as needed for tremors. 30 tablet 0  . divalproex (DEPAKOTE ER) 500 MG 24 hr tablet Take 2 tablets (1,000 mg total) by mouth at bedtime. For mood stabilization 60 tablet 0  . LORazepam (ATIVAN) 2 MG tablet Take 1 tablet (2 mg total) by mouth every 6 (six) hours as needed for anxiety (agitation. Please hold for oversedation.). 12 tablet 0  . risperiDONE (RISPERDAL M-TABS) 1 MG disintegrating tablet Take 1 tablet (1 mg total) by mouth 2 (two) times daily as needed (agitation). 60 tablet 0  . risperiDONE (RISPERDAL M-TABS) 3 MG disintegrating tablet Take 1 tablet (3 mg total) by mouth at bedtime. For mood control 30 tablet 0    Musculoskeletal: Strength & Muscle Tone: within normal limits Gait & Station: normal Patient leans: N/A  Psychiatric Specialty Exam: Physical Exam  Constitutional: She is oriented to person, place, and time.  Neck: Normal range of motion.  Respiratory: Effort normal.  Musculoskeletal: Normal range of motion.  Neurological: She is alert and oriented to person, place, and time.    Review of  Systems  Psychiatric/Behavioral: Positive for depression and hallucinations. Negative for substance abuse and suicidal ideas. The patient is not nervous/anxious.     Blood pressure 134/82, pulse 90, temperature 98.8 F (37.1 C), temperature source Oral, resp. rate 18, height _0  (1.626 m), weight 86.6 kg (191 lb), SpO2 100 %.Body mass index is 32.79 kg/m.  General Appearance: Casual  Eye Contact:  Good  Speech:  Slow  Volume:  Decreased  Mood:  Dysphoric  Affect:  Blunt  Thought Process:  Disorganized and Descriptions of Associations: Loose  Orientation:  Other:  Person and place  Thought Content:  Delusions and Hallucinations: Auditory  Suicidal Thoughts:  No  Homicidal Thoughts:  No  Memory:  Immediate;   Poor Recent;   Poor Remote;   Poor  Judgement:  Impaired   Insight:  Lacking  Psychomotor Activity:  Decreased  Concentration:  Concentration: Poor and Attention Span: Poor  Recall:  Poor  Fund of Knowledge:  Fair  Language:  Fair  Akathisia:  No  Handed:  Right  AIMS (if indicated):     Assets:  Housing Physical Health Resilience Social Support  ADL's:  Intact  Cognition:  Impaired,  Moderate  Sleep:        Treatment Plan Summary: Daily contact with patient to assess and evaluate symptoms and progress in treatment and Medication management:  Changed Risperdal M tab to  2 mg Bid for psychosis Changed Cogentin 0.5 mg bid for EPS Added Trazodone 50 mg Q hs for insomnia Added Saphris 5 mg Q 12 hr prn agitation Discontinued Ativan 2 mg Q 6 hrs prn  Disposition: Recommend psychiatric Inpatient admission when medically cleared.  Rankin, Delphia Grates, NP 05/06/2017 11:45 AM  Patient seen face-to-face for psychiatric evaluation, chart reviewed and case discussed with the physician extender and developed treatment plan. Reviewed the information documented and agree with the treatment plan. Corena Pilgrim, MD

## 2017-05-06 NOTE — ED Notes (Signed)
Pt is very religiously preoccupied.  She is intrusively telling patients and staff to "REPENT"   Pt is difficult to redirect as she takes it personal as if we are rejecting her suggestions.   She has not shown any aggression toward anyone.  She denies S/I , H/I, and AVH.  15 minute checks and video monitoring in place.

## 2017-05-07 ENCOUNTER — Encounter (HOSPITAL_COMMUNITY): Payer: Self-pay

## 2017-05-07 ENCOUNTER — Inpatient Hospital Stay (HOSPITAL_COMMUNITY)
Admission: AD | Admit: 2017-05-07 | Discharge: 2017-05-17 | DRG: 885 | Disposition: A | Discharge status: Home / Self Care | Payer: Federal, State, Local not specified - Other | Attending: Psychiatry | Admitting: Psychiatry

## 2017-05-07 DIAGNOSIS — Z818 Family history of other mental and behavioral disorders: Secondary | ICD-10-CM

## 2017-05-07 DIAGNOSIS — Z8249 Family history of ischemic heart disease and other diseases of the circulatory system: Secondary | ICD-10-CM | POA: Diagnosis not present

## 2017-05-07 DIAGNOSIS — Z833 Family history of diabetes mellitus: Secondary | ICD-10-CM

## 2017-05-07 DIAGNOSIS — F1910 Other psychoactive substance abuse, uncomplicated: Secondary | ICD-10-CM | POA: Diagnosis not present

## 2017-05-07 DIAGNOSIS — G4700 Insomnia, unspecified: Secondary | ICD-10-CM | POA: Diagnosis present

## 2017-05-07 DIAGNOSIS — Z9114 Patient's other noncompliance with medication regimen: Secondary | ICD-10-CM

## 2017-05-07 DIAGNOSIS — F419 Anxiety disorder, unspecified: Secondary | ICD-10-CM | POA: Diagnosis present

## 2017-05-07 DIAGNOSIS — F23 Brief psychotic disorder: Secondary | ICD-10-CM | POA: Diagnosis present

## 2017-05-07 DIAGNOSIS — Z79899 Other long term (current) drug therapy: Secondary | ICD-10-CM | POA: Diagnosis not present

## 2017-05-07 DIAGNOSIS — F200 Paranoid schizophrenia: Principal | ICD-10-CM | POA: Diagnosis present

## 2017-05-07 DIAGNOSIS — R443 Hallucinations, unspecified: Secondary | ICD-10-CM | POA: Diagnosis not present

## 2017-05-07 DIAGNOSIS — F29 Unspecified psychosis not due to a substance or known physiological condition: Secondary | ICD-10-CM | POA: Diagnosis present

## 2017-05-07 MED ORDER — ACETAMINOPHEN 325 MG PO TABS: 650.0000 mg | ORAL_TABLET | Freq: Four times a day (QID) | ORAL | Status: DC | PRN

## 2017-05-07 MED ORDER — BENZTROPINE MESYLATE 0.5 MG PO TABS
0.5000 mg | ORAL_TABLET | Freq: Two times a day (BID) | ORAL | Status: DC
Start: 2017-05-07 — End: 2017-05-08
  Administered 2017-05-07: 0.5 mg via ORAL
  Filled 2017-05-07 (×6): qty 1

## 2017-05-07 MED ORDER — MAGNESIUM HYDROXIDE 400 MG/5ML PO SUSP: 30.0000 mL | Freq: Every day | ORAL | Status: DC | PRN

## 2017-05-07 MED ORDER — TRAZODONE HCL 50 MG PO TABS
50.0000 mg | ORAL_TABLET | Freq: Every day | ORAL | Status: DC
  Administered 2017-05-07 – 2017-05-14 (×8): 50 mg via ORAL
  Filled 2017-05-07 (×12): qty 1

## 2017-05-07 MED ORDER — ASENAPINE MALEATE 5 MG SL SUBL
5.0000 mg | SUBLINGUAL_TABLET | Freq: Two times a day (BID) | SUBLINGUAL | Status: DC | PRN
  Administered 2017-05-08: 5 mg via SUBLINGUAL
  Filled 2017-05-07: qty 1

## 2017-05-07 MED ORDER — RISPERIDONE 2 MG PO TBDP
2.0000 mg | ORAL_TABLET | Freq: Two times a day (BID) | ORAL | Status: DC
  Administered 2017-05-07: 2 mg via ORAL
  Filled 2017-05-07 (×2): qty 1
  Filled 2017-05-07: qty 2
  Filled 2017-05-07: qty 1
  Filled 2017-05-07: qty 2
  Filled 2017-05-07: qty 1

## 2017-05-07 MED ORDER — ALUM & MAG HYDROXIDE-SIMETH 200-200-20 MG/5ML PO SUSP: 30.0000 mL | ORAL | Status: DC | PRN

## 2017-05-07 MED ORDER — HYDROXYZINE HCL 25 MG PO TABS
25.0000 mg | ORAL_TABLET | Freq: Four times a day (QID) | ORAL | Status: DC | PRN
  Administered 2017-05-10: 25 mg via ORAL
  Filled 2017-05-07: qty 10
  Filled 2017-05-07: qty 1

## 2017-05-07 NOTE — Tx Team (Signed)
Initial Treatment Plan 05/07/2017 7:00 PM Lindsey Richmond ZOX:[id number]    PATIENT STRESSORS: Financial difficulties Medication change or noncompliance   PATIENT STRENGTHS: General fund of knowledge Physical Health Supportive family/friends   PATIENT IDENTIFIED PROBLEMS: Psychosis  "I just want to be discharged real soon"                   DISCHARGE CRITERIA:  Improved stabilization in mood, thinking, and/or behavior Need for constant or close observation no longer present Verbal commitment to aftercare and medication compliance  PRELIMINARY DISCHARGE PLAN: Outpatient therapy Medication Management  PATIENT/FAMILY INVOLVEMENT: This treatment plan has been presented to and reviewed with the patient, Lindsey Richmond.  The patient and family have been given the opportunity to ask questions and make suggestions.  Levin BaconHeather V Maitlyn Penza, RN 05/07/2017, 7:00 PM

## 2017-05-07 NOTE — ED Notes (Signed)
[facility] Expected date:  Expected time:  Means of arrival:  Comments: Hold for remodel

## 2017-05-07 NOTE — ED Notes (Signed)
Hourly rounding reveals patient sleeping in room. No complaints, stable, in no acute distress. Q15 minute rounds and monitoring via Security Cameras to continue. 

## 2017-05-07 NOTE — Progress Notes (Addendum)
Patient is religiossly preoccupied at the beginning of the shift. She reported that her day has been stressful because " Jesus is coming back with his Judgement". She also said "I feel demonically possessed and awfully scared". She denied hearing voice but appeared to be preoccupied and responding to internal stimuli. She covers her ears with her fingers. Her mood and affects depressed and sad. She seemed anxious. She has slow response to verbal command and psychomotor retardation. She requested for a bible. Writer encouraged and supported patient and offered her a booklet of new testament Bible and told patient she is safe in the mileu. She yelled " Jesus loves you". Q 15 minutes continues for safety.

## 2017-05-07 NOTE — Progress Notes (Signed)
TTS [facility] to inquire if the pt still needs placement. Christiane HaJonathan states the pt is being reviewed.  Princess BruinsAquicha Efrat Zuidema, MSW, LCSWA TTS Specialist [phone]

## 2017-05-07 NOTE — Progress Notes (Signed)
Did not attend group 

## 2017-05-07 NOTE — Progress Notes (Signed)
Patient would not answer SI/HI/A/V questions.  Patient looks at nurse and smiles and holds her head down.  Respirations even and unlabored.  No signs/symptoms of pain/distress noted on patient's face/body movements.  Safety maintained with 15 minute checks.

## 2017-05-07 NOTE — Progress Notes (Signed)
Lindsey Richmond is a 21 year old female being admitted involuntariy to [facility] from [facility].  She came to the ED with active psychosis.  She was hyperreligious, yelling "repent," and responding to internal stimuli.  She denied SI/HI.  She is diagnosed with Unspecified Schizophrenia spectrum and othe rpsychotic disorder.  She was very [facility].  She does report hearing voices and she was noted talking to herself. She stated that she was brought in by EMS because she was yelling out [facility].  Oriented her to the unit.Admission paperwork completed and signed.  Belongings searched and secured in locker # 34.  Skin assessment completed and noted acne scars on chest and upper back.  Q 15 minute checks initiated for safety.  We will monitor the progress towards her goals.

## 2017-05-07 NOTE — ED Notes (Signed)
[facility] [employer]. Pt calm and cooperative. All belongings returned to pt who signed for same.

## 2017-05-07 NOTE — BH Assessment (Addendum)
[facility] Assessment Progress Note This Clinical research associatewriter spoke with patient earlier this date to assess treatment progress. Patient continues to be somewhat hyper religious as she quotes scriptures from the bible. Patient asks this Clinical research associatewriter if "the Shaune PollackLord is coming." Patient is somewhat redirectable and did report that she is "feeeling better" but is very guarded as she interacts with this Clinical research associatewriter. Patient reports that she has been taking her medications but states she is unsure if they "are working." Patient denies suicidal/homicidal ideation and will not respond to some of this writer's questions. Case was staffed with Jannifer FranklinAkintayo MD who recommends continued inpatient monitoring.

## 2017-05-07 NOTE — ED Notes (Signed)
Pt. In restroom. 

## 2017-05-07 NOTE — BH Assessment (Addendum)
[facility] Assessment Progress Note  Per Thedore MinsMojeed Akintayo, MD, this pt requires psychiatric hospitalization.  Malva LimesLinsey Strader, RN, [facility], she will call when they are ready to receive pt.  Pt presents under IVC initiated by EDP Geoffery Lyonsouglas Delo, MD,  and [facility].  Pt's nurse, Diane, has been notified, and agrees to call report to [phone].  Pt is to be transported via [employer] examinerlaw enforcement.   Doylene Canninghomas Spero Gunnels, MA Triage Specialist [phone]  Addendum:  Richelle ItoLinsey reports that they will be ready for pt at 14:30.  Pt's nurse has been notified.  Doylene Canninghomas Chandrika Sandles, MA Triage Specialist [phone]

## 2017-05-07 NOTE — Progress Notes (Signed)
05/07/2017 Approximately 2:03pm intern offered activities to pt and pt was interested. Approximately 2:10pm pt joined Tax inspectorintern and LRT in dayroom and played [place]. Due to construction the dayroom was being closed down, so we stopped playing Jenga at 2:18pm. Intern offered word searches to pt. Approximately 2:25pm intern gave pt word searches pertaining to fish, basketball and animals.  Marvell Fullerachel Meyer, Recreation Therapy Intern    Caroll RancherMarjette Delane Wessinger, LRT/CTRS

## 2017-05-07 NOTE — ED Notes (Signed)
Pt took her medications reluctantly, but did so because she believes it will help her get discharged.

## 2017-05-07 NOTE — ED Notes (Signed)
Introduced self to patient/family. Pt oriented to unit expectations.  Assessed pt for:  A) Anxiety &/or agitation: Pt is calm and cooperative, but appears to be suspicious or responding to internal stimuli with glaring eye contact. Pt's father has visited, spoke with Dr. Jannifer FranklinAkintayo, and is supportive.   S) Safety: Safety maintained with q-15-minute checks and hourly rounds by staff.   A) ADLs: Pt able to perform ADLs independently. Pt is taking care of her personal hygiene.   P) Pick-Up (room cleanliness): Pt's room clean and free of clutter.

## 2017-05-08 ENCOUNTER — Encounter (HOSPITAL_COMMUNITY): Payer: Self-pay | Admitting: Psychiatry

## 2017-05-08 MED ORDER — ARIPIPRAZOLE 5 MG PO TABS
5.0000 mg | ORAL_TABLET | Freq: Every day | ORAL | Status: DC
  Administered 2017-05-08 – 2017-05-11 (×3): 5 mg via ORAL
  Filled 2017-05-08 (×6): qty 1

## 2017-05-08 MED ORDER — OLANZAPINE 5 MG PO TABS
5.0000 mg | ORAL_TABLET | Freq: Three times a day (TID) | ORAL | Status: DC | PRN
  Administered 2017-05-10 – 2017-05-12 (×4): 5 mg via ORAL
  Filled 2017-05-08 (×4): qty 2

## 2017-05-08 MED ORDER — BENZTROPINE MESYLATE 0.5 MG PO TABS
0.5000 mg | ORAL_TABLET | Freq: Every day | ORAL | Status: DC
  Administered 2017-05-09 – 2017-05-17 (×9): 0.5 mg via ORAL
  Filled 2017-05-08 (×12): qty 1

## 2017-05-08 MED ORDER — OLANZAPINE 10 MG IM SOLR: 5.0000 mg | Freq: Three times a day (TID) | INTRAMUSCULAR | Status: DC | PRN

## 2017-05-08 MED ORDER — DIVALPROEX SODIUM ER 500 MG PO TB24
1000.0000 mg | ORAL_TABLET | Freq: Every day | ORAL | Status: DC
  Administered 2017-05-08 – 2017-05-16 (×8): 1000 mg via ORAL
  Filled 2017-05-08 (×9): qty 2
  Filled 2017-05-08: qty 14
  Filled 2017-05-08 (×2): qty 2

## 2017-05-08 MED ORDER — LORAZEPAM 2 MG/ML IJ SOLN
1.0000 mg | Freq: Four times a day (QID) | INTRAMUSCULAR | Status: DC | PRN
  Administered 2017-05-08 – 2017-05-10 (×2): 1 mg via INTRAMUSCULAR
  Filled 2017-05-08 (×2): qty 1

## 2017-05-08 MED ORDER — HALOPERIDOL 5 MG PO TABS
5.0000 mg | ORAL_TABLET | Freq: Once | ORAL | Status: DC
  Filled 2017-05-08: qty 1

## 2017-05-08 MED ORDER — LORAZEPAM 1 MG PO TABS
1.0000 mg | ORAL_TABLET | Freq: Four times a day (QID) | ORAL | Status: DC | PRN
  Administered 2017-05-09 – 2017-05-13 (×9): 1 mg via ORAL
  Filled 2017-05-08 (×10): qty 1

## 2017-05-08 MED ORDER — ARIPIPRAZOLE 5 MG PO TABS
5.0000 mg | ORAL_TABLET | Freq: Every day | ORAL | Status: DC
  Administered 2017-05-09 – 2017-05-13 (×5): 5 mg via ORAL
  Filled 2017-05-08 (×8): qty 1

## 2017-05-08 MED ORDER — LORAZEPAM 1 MG PO TABS
2.0000 mg | ORAL_TABLET | Freq: Once | ORAL | Status: AC
  Administered 2017-05-08: 2 mg via ORAL
  Filled 2017-05-08: qty 2

## 2017-05-08 MED ORDER — BENZTROPINE MESYLATE 0.5 MG PO TABS
0.5000 mg | ORAL_TABLET | Freq: Every day | ORAL | Status: DC
  Administered 2017-05-08 – 2017-05-16 (×8): 0.5 mg via ORAL
  Filled 2017-05-08 (×12): qty 1

## 2017-05-08 NOTE — Progress Notes (Signed)
D: Pt observed yelling / chanting down hall "Repent, Repent, I say get out of me, out of my room". Unable to calm at this time. Continues to escalate with despite multiple redirections. A: PRN Ativan 1 mg IM given due to PO medication refusal on initial offer without issues. Emotional support and encouragement provided to pt. 1:1 observation continues per MD's order.  R: Pt awake in bed at this time, talking to self at intervals. Safety maintained.

## 2017-05-08 NOTE — H&P (Signed)
Psychiatric Admission Assessment Adult  Patient Identification: Lindsey Richmond MRN:  [id number] Date of Evaluation:  05/08/2017 Chief Complaint:  Patient states " I hear Lindsey.'  Principal Diagnosis: Schizophrenia, paranoid (HCC) Diagnosis:   Patient Active Problem List   Diagnosis Date Noted  . 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  [name] was seen in her room , she was a bit groggy from the psychotropic medications she received , however was alert and attempted to answer questions asked. However , since she is a poor historian at this time , attempted to contact her mother Lindsey Richmond - # noted in EHR. However mother is not reachable .  Hence majority of information obtained from EHR. She was brought in to ED with bizarre behavior , seemed to be hyperreligious , talked about Lindsey Richmond , responding to internal stimuli . She was recently discharged from [facility] 7/18- 04/25/17. At that time was diagnosed with schizophrenia , and was discharged on Depakote ER 1000 mg and Risperdal 3 mg po qhs .  Pt today reports that she was not compliant with medications after discharge.  Per RN - pt required PRN medications for disorganized behaviors as well as is currently on a 1:1 precaution due to be intrusive , labile , delusional .      Associated Signs/Symptoms: Depression Symptoms:  anxiety, (Hypo) Manic Symptoms:  Delusions, Distractibility, Grandiosity, Hallucinations, Impulsivity, Irritable Mood, Labiality of Mood, Anxiety Symptoms:  Excessive Worry, Psychotic Symptoms:  Delusions, Hallucinations: Auditory Paranoia, PTSD Symptoms: Negative Total Time spent with patient: 45 minutes  Past Psychiatric History: Pt was recently diagnosed with schizophrenia. Pt was just discharged from IP unit at  [facility] on 04/25/17. Per past records - collateral information obtained from family in the past - Pt was once admitted from mental illness 2 yrs ago. Pt however was doing well and was doing well at school. However she walked away from home prior to last IP admission , was seen as naked , holding a sign " heaven or hell" and was admitted.Per father , no past hx of trauma or substance abuse.  Is the patient at risk to self? Yes.    Has the patient been a risk to self in the past 6 months? Yes.    Has the patient been a risk to self within the distant past? No.  Is the patient a risk to others? Yes.    Has the patient been a risk to others in the past 6 months? Yes.    Has the patient been a risk to others within the distant past? No.   Prior Inpatient Therapy:   Prior Outpatient Therapy:    Alcohol Screening: 1. How often do you have a drink containing alcohol?: Monthly or less 2. How many drinks containing alcohol do you have on a typical day when you are drinking?: 1 or 2 3. How often do you have six or more drinks on one occasion?: Less than monthly Preliminary Score: 1 9. Have you or someone else been injured as a result of your drinking?: No 10. Has a relative or friend or a doctor or another health worker been concerned about your drinking or suggested you cut down?: No Alcohol Use Disorder Identification Test Final Score (AUDIT): 2 Brief Intervention: AUDIT score less  than 7 or less-screening does not suggest unhealthy drinking-brief intervention not indicated Substance Abuse History in the last 12 months:  No. Consequences of Substance Abuse: Negative Previous Psychotropic Medications: Yes , risperidone, depakote Psychological Evaluations: Yes  Past Medical History: Please see H&P.  Family History:  Family History  Problem Relation Age of Onset  . Heart failure Mother   . Hypertension Mother   . Diabetes Father    Family Psychiatric  History: patient unable to give hx, family not  reachable at this time. However per past records - grand mother has mental illness.  Tobacco Screening: Have you used any form of tobacco in the last 30 days? (Cigarettes, Smokeless Tobacco, Cigars, and/or Pipes): No Social History: single, Consulting civil engineer doing business major at [employer], lives in [place]. History  Alcohol Use No     History  Drug Use No    Additional Social History:                           Allergies:  No Known Allergies Lab Results: No results found for this or any previous visit (from the past 48 hour(s)).  Blood Alcohol level:  Lab Results  Component Value Date   ETH <5 05/05/2017   ETH <5 04/17/2017    Metabolic Disorder Labs:  No results found for: HGBA1C, MPG No results found for: PROLACTIN No results found for: CHOL, TRIG, HDL, CHOLHDL, VLDL, LDLCALC  Current Medications: Current Facility-Administered Medications  Medication Dose Route Frequency Provider Last Rate Last Dose  . acetaminophen (TYLENOL) tablet 650 mg  650 mg Oral Q6H PRN Laveda Abbe, NP      . alum & mag hydroxide-simeth (MAALOX/MYLANTA) 200-200-20 MG/5ML suspension 30 mL  30 mL Oral Q4H PRN Laveda Abbe, NP      . ARIPiprazole (ABILIFY) tablet 5 mg  5 mg Oral QHS Jomarie Longs, MD      . Melene Muller ON 05/09/2017] ARIPiprazole (ABILIFY) tablet 5 mg  5 mg Oral Daily Mattea Seger, MD      . benztropine (COGENTIN) tablet 0.5 mg  0.5 mg Oral QHS Jomarie Longs, MD      . Melene Muller ON 05/09/2017] benztropine (COGENTIN) tablet 0.5 mg  0.5 mg Oral Daily Alliya Marcon, MD      . divalproex (DEPAKOTE ER) 24 hr tablet 1,000 mg  1,000 mg Oral QHS Arna Luis, MD      . hydrOXYzine (ATARAX/VISTARIL) tablet 25 mg  25 mg Oral Q6H PRN Laveda Abbe, NP      . LORazepam (ATIVAN) tablet 1 mg  1 mg Oral Q6H PRN Jomarie Longs, MD       Or  . LORazepam (ATIVAN) injection 1 mg  1 mg Intramuscular Q6H PRN Anjalina Bergevin, MD      . magnesium hydroxide (MILK OF MAGNESIA) suspension  30 mL  30 mL Oral Daily PRN Laveda Abbe, NP      . OLANZapine (ZYPREXA) tablet 5 mg  5 mg Oral TID PRN Jomarie Longs, MD       Or  . OLANZapine (ZYPREXA) injection 5 mg  5 mg Intramuscular TID PRN Jomarie Longs, MD      . traZODone (DESYREL) tablet 50 mg  50 mg Oral QHS Laveda Abbe, NP   50 mg at 05/07/17 2157   PTA Medications: Prescriptions Prior to Admission  Medication Sig Dispense Refill Last Dose  . benztropine (COGENTIN) 0.5 MG tablet Take 1 tablet (0.5 mg total)  by mouth daily as needed for tremors. 30 tablet 0   . divalproex (DEPAKOTE ER) 500 MG 24 hr tablet Take 2 tablets (1,000 mg total) by mouth at bedtime. For mood stabilization 60 tablet 0   . LORazepam (ATIVAN) 2 MG tablet Take 1 tablet (2 mg total) by mouth every 6 (six) hours as needed for anxiety (agitation. Please hold for oversedation.). 12 tablet 0   . risperiDONE (RISPERDAL M-TABS) 1 MG disintegrating tablet Take 1 tablet (1 mg total) by mouth 2 (two) times daily as needed (agitation). 60 tablet 0   . risperiDONE (RISPERDAL M-TABS) 3 MG disintegrating tablet Take 1 tablet (3 mg total) by mouth at bedtime. For mood control 30 tablet 0     Musculoskeletal: Strength & Muscle Tone: within normal limits Gait & Station: SEEN IN BED Patient leans: N/A  Psychiatric Specialty Exam: Physical Exam  Review of Systems  Psychiatric/Behavioral: Positive for hallucinations.  All other systems reviewed and are negative.   Blood pressure (!) 114/59, pulse 96, temperature 98.9 F (37.2 C), temperature source Oral, resp. rate 18, height 5\' 4"  (1.626 m), weight 83.5 kg (184 lb).Body mass index is 31.58 kg/m.  General Appearance: Guarded  Eye Contact:  Minimal  Speech:  Slow  Volume:  Decreased  Mood:  Anxious and Dysphoric  Affect:  Congruent  Thought Process:  Disorganized, Irrelevant and Descriptions of Associations: Circumstantial  Orientation:  Other:  place, time, self  Thought Content:  Illogical,  Delusions, Hallucinations: Auditory, Paranoid Ideation and Rumination  Suicidal Thoughts:  did not express any  Homicidal Thoughts:  did not express any  Memory:  Immediate;   Fair Recent;   Poor Remote;   Poor  Judgement:  Impaired  Insight:  Shallow  Psychomotor Activity:  Restlessness  Concentration:  Concentration: Poor and Attention Span: Poor  Recall:  Poor  Fund of Knowledge:  Poor  Language:  Fair  Akathisia:  No  Handed:  Right  AIMS (if indicated):     Assets:  Social Support Talents/Skills  ADL's:  Impaired  Cognition:  WNL  Sleep:  Number of Hours: 3.5    Treatment Plan Summary:Patient is a [age] y old AAF with hx of schizophrenia , noncompliance with treatment , biologically predisposed since grand mother has mental illness, unable to express any psychosocial stressors , will admit and start treatment. Will start 1:1 precaution for safety reasons. Will expand collateral information from family.  Daily contact with patient to assess and evaluate symptoms and progress in treatment, Medication management and Plan see below  Patient will benefit from inpatient treatment and stabilization.  Estimated length of stay is 5-7 days.  Reviewed past medical records,treatment plan.  Will start Abilify 5 mg po bid for psychosis. Plan to discharge on Abilify Maintena IM. Will restart Depakote ER 1000 mg po qhs for mood sx. Depakote level therapeutic - 78 ( 05/05/2017) Will continue to monitor vitals ,medication compliance and treatment side effects while patient is here.  Will monitor for medical issues as well as call consult as needed.  Reviewed labs uds - positive for BZD( Did receive it in ED)   ,TSH - wnl, pregnancy test - negative ,will order hba1c, pl as well as lipid panel. EKG for qtc reviewed - wnl. CSW will start working on disposition.  Patient to participate in therapeutic milieu .       Observation Level/Precautions:  1 to 1    Psychotherapy:  Individual and group  therapy  Consultations:  CSW  Discharge Concerns:  Stability and safety       Physician Treatment Plan for Primary Diagnosis: Schizophrenia, paranoid (HCC) Long Term Goal(s): Improvement in symptoms so as ready for discharge  Short Term Goals: Ability to identify changes in lifestyle to reduce recurrence of condition will improve, Ability to verbalize feelings will improve and Compliance with prescribed medications will improve  Physician Treatment Plan for Secondary Diagnosis: Principal Problem:   Schizophrenia, paranoid (HCC)  Long Term Goal(s): Improvement in symptoms so as ready for discharge  Short Term Goals: Ability to identify changes in lifestyle to reduce recurrence of condition will improve, Ability to verbalize feelings will improve and Compliance with prescribed medications will improve  I certify that inpatient services furnished can reasonably be expected to improve the patient's condition.    Jomarie Longs, MD 8/7/201812:57 PM

## 2017-05-08 NOTE — Progress Notes (Signed)
D: Pt visible in dayroom with assigned 1:1 staff. Tolerated dinner and fluids well this evening. Presents calmer and verbally redirectable in comparison to this earlier this afternoon.  A: Continued support and encouragement provided to pt. 1:1 precautions remains effective as ordered without incident to note at this time.  R: Pt remains safe on unit without self harm gestures or outburst.

## 2017-05-08 NOTE — [facility] Suicide Risk Assessment (Signed)
[facility]   Nursing information obtained from:  Patient Demographic factors:  Adolescent or young adult Current Mental Status:  NA Loss Factors:  NA Historical Factors:  NA Risk Reduction Factors:  NA  Total Time spent with patient: 30 minutes Principal Problem: Schizophrenia, paranoid (HCC) Diagnosis:   Patient Active Problem List   Diagnosis Date Noted  . Schizophrenia, paranoid (HCC) [F20.0] 04/20/2017    Priority: High   Subjective Data: Please see H&P.   Continued Clinical Symptoms:  Alcohol Use Disorder Identification Test Final Score (AUDIT): 2 The "Alcohol Use Disorders Identification Test", Guidelines for Use in Primary Care, Second Edition.  World Science writerHealth Organization [facility](WHO). Score between 0-7:  no or low risk or alcohol related problems. Score between 8-15:  moderate risk of alcohol related problems. Score between 16-19:  high risk of alcohol related problems. Score 20 or above:  warrants further diagnostic evaluation for alcohol dependence and treatment.   CLINICAL FACTORS:   Severe Anxiety and/or Agitation Schizophrenia:   Less than 19 years old Paranoid or undifferentiated type Currently Psychotic Unstable or Poor Therapeutic Relationship Previous Psychiatric Diagnoses and Treatments   Musculoskeletal: Strength & Muscle Tone: within normal limits Gait & Station: normal Patient leans: N/A  Psychiatric Specialty Exam: Physical Exam  Review of Systems  Psychiatric/Behavioral: Positive for hallucinations.  All other systems reviewed and are negative.   Blood pressure (!) 114/59, pulse 96, temperature 98.9 F (37.2 C), temperature source Oral, resp. rate 18, height 5\' 4"  (1.626 m), weight 83.5 kg (184 lb).Body mass index is 31.58 kg/m.                  Please see H&P.                                         COGNITIVE FEATURES THAT CONTRIBUTE TO RISK:  Closed-mindedness, Polarized thinking and  Thought constriction (tunnel vision)    SUICIDE RISK:   Moderate:  Frequent suicidal ideation with limited intensity, and duration, some specificity in terms of plans, no associated intent, good self-control, limited dysphoria/symptomatology, some risk factors present, and identifiable protective factors, including available and accessible social support.  PLAN OF CARE: Please see H&P.   I certify that inpatient services furnished can reasonably be expected to improve the patient's condition.   Bradie Lacock, MD 05/08/2017, 12:55 PM

## 2017-05-08 NOTE — Progress Notes (Addendum)
I:1 Note: Patient became increasingly deteriorated as the night progressed. On  her menstrual period, voided on herself, knelt down and in the bathroom in her urine/blood and was yelling loudly " repent, repent, Jesus is coming back" . She was crying and jumping and non redirectable and had poor impulse control. Writer notified PA. I:I safety precaution initiated as ordered by the PA. Her thought process disorganized and she is out of touch with reality. Patient received medications as ordered. I:I observation initiated for patient's safety.

## 2017-05-08 NOTE — [facility] Group Notes (Signed)
[facility] LCSW Group Therapy  05/08/2017 1:15 pm  Type of Therapy: Process Group Therapy  Participation Level:  Active  Participation Quality:  Appropriate  Affect:  Flat  Cognitive:  Oriented  Insight:  Improving  Engagement in Group:  Limited  Engagement in Therapy:  Limited  Modes of Intervention:  Activity, Clarification, Education, Problem-solving and Support  Summary of Progress/Problems: Today's group addressed the issue of overcoming obstacles.  Patients were asked to identify their biggest obstacle post d/c that stands in the way of their on-going success, and then problem solve as to how to manage this. Invited.  Chose to not attend.  Daryel Geraldorth, Candance Bohlman B 05/08/2017   2:57 PM

## 2017-05-08 NOTE — Progress Notes (Signed)
I:I Note: Patient in bed sleeping since the beginning of the shift. Sitter reported that patient wakes up intermittently and "chant repent, Jesus is coming back". She woke up and took all her HS medications. She has slow response to verbal commands and psychomotor retardation. Her speech very soft and slow. Thought process disorganized and is religiously preoccupied. She denied voices but continues to respond to internal stimuli. Patient safe with the sitter. I:I observation continues as ordered.

## 2017-05-08 NOTE — Progress Notes (Signed)
Recreation Therapy Notes  Date: 05/08/17 Time: 1000 Location: 500 Hall Dayroom  Group Topic: Communication  Goal Area(s) Addresses:  Patient will effectively communicate with peers in group.  Patient will verbalize benefit of healthy communication. Patient will verbalize positive effect of healthy communication on post d/c goals.  Patient will identify communication techniques that made activity effective for group.   Intervention:  Geometric images, blank paper, pencils   Activity: Back-to-Back Drawing.  Patients were placed in groups of two.  Each group was seated back to back.  One person was designated the speaker and the other person was the listener.  The speaker described the geometrical image they were given to the listener.  The listener, in turn, drew the image that was described to them by the speaker.    Education: Communication, Discharge Planning  Education Outcome: Acknowledges understanding/In group clarification offered/Needs additional education.   Clinical Observations/Feedback: Pt did not attend group.    Makesha Belitz, LRT/CTRS         Roya Gieselman A 05/08/2017 11:49 AM 

## 2017-05-08 NOTE — B[facility] Counselor (Signed)
Adult Comprehensive Assessment  Patient ID: Naiara Lombardozzi, female   DOB: 06-Feb-1996, 21 y.o.   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  What is atmosphere in current home: Comfortable, Loving, Supportive  Family History:  Marital status: Single Are you sexually active?: Yes What is your sexual orientation?: heterosexual Has your sexual activity been affected by drugs, alcohol, medication, or emotional stress?: n/a  Does patient have children?: No  Childhood History:  By whom was/is the patient raised?: Both parents Additional childhood history information: born [place].  Description of patient's relationship with caregiver when they were a child: close to both parents; mother [place]; father from [place];  Patient's description of current relationship with people who raised him/her: close to both parents.  How were you disciplined when you got in trouble as a child/adolescent?: n/a  Does patient have siblings?: Yes Number of Siblings: 1 Description of patient's current relationship with siblings: close to her brother  Did patient suffer any verbal/emotional/physical/sexual abuse as a child?: No Did patient suffer from severe childhood neglect?: No Has patient ever been  sexually abused/assaulted/raped as an adolescent or adult?: No Was the patient ever a victim of a crime or a disaster?: No Witnessed domestic violence?: No Has patient been effected by domestic violence as an adult?: No  Education:  Highest grade of school patient has completed: 1 yr college at [employer] in [place], [place]. rising sophomore at [employer] Currently a Consulting civil engineer?: Yes   Employment/Work Situation:  Employment situation: Surveyor, minerals job has been impacted by current illness: No What is the longest time patient has a held a job?: never worked  Where was the patient employed at that time?: never worked  Has patient ever been in the [employer]?: No Has patient ever served in Buyer, retail?: No Did You Receive Any Psychiatric Treatment/Services While in Equities trader?: No Are There Guns or Other Weapons in Your Home?: No  Financial Resources:  Surveyor, quantity resources: Medicaid, Support from parents / caregiver Does patient have a Lawyer or guardian?: No  Alcohol/Substance Abuse:  What has been your use of drugs/alcohol within the last 12 months?: none  If attempted suicide, did drugs/alcohol play a role in this?: No Alcohol/Substance Abuse Treatment Hx: Denies past history If yes, describe treatment:  Has alcohol/substance abuse ever caused legal problems?: No  Social Support System: Forensic psychologist System: Fair Museum/gallery exhibitions officer System: family; not many friends Type of faith/religion: Ephriam Knuckles How does patient's faith help to cope with current illness?: n/a   Leisure/Recreation:  Leisure and Hobbies: reading, jogging, and walking in the neighborhood  Strengths/Needs:  What things does the patient do well?: "she's a smart girl and when she is mentally well, she does well in school." In what areas does patient struggle / problems for patient: lacking insight; psychotic;   Discharge Plan:  Does patient have access to  transportation?: Yes Will patient  be returning to same living situation after discharge?: Yes (home with parents) Currently receiving community mental health services: Yes (From [facility]om)[facility] If no, would patient like referral for services when discharged?:  Does patient have financial barriers related to discharge medications?: No    Summary/Recommendations:   Summary and Recommendations (to be completed by the evaluator): [name] is a 21 YO AA female diagnosed with Schiophrenia, Paranoid.  She returns to us after d/cing just 2 weeks ago under IVC due to disorganized, bizarre behavior.  Mother states that she now realizes that [name] was not taking meds as prescribed and that she will hold and administer meds when [name] returns home.  In the meantime, she can benefit from crises stabilization, medication management, therapeutic milieu and coordination with outpt provider.  Ida Rogueodney B Yenesis Even. 05/08/2017

## 2017-05-08 NOTE — Progress Notes (Signed)
D: Pt asleep in bed at this time. Respirations noted and unlabored.  A: 1:1 observation maintained as ordered. Assigned staff in attendance at all times. Will allow pt to sleep and attempt morning medications when pt wakes up due to report of poor sleep last night.  R: Pt remains safe on unit without outburst at this time.

## 2017-05-08 NOTE — [facility] Suicide Risk Assessment (Signed)
[facility] INPATIENT:  Family/Significant Other Suicide Prevention Education  Suicide Prevention Education:  Education Completed; Lindsey Richmond, mother, [phone] has been identified by the patient as the family member/significant other with whom the patient will be residing, and identified as the person(s) who will aid the patient in the event of a mental health crisis (suicidal ideations/suicide attempt).  With written consent from the patient, the family member/significant other has been provided the following suicide prevention education, prior to the and/or following the discharge of the patient.  The suicide prevention education provided includes the following:  Suicide risk factors  Suicide prevention and interventions  National Suicide Hotline telephone number  [facility] assessment telephone number  [facility] Emergency Assistance 911  [facility] and/or Residential Mobile Crisis Unit telephone number  Request made of family/significant other to:  Remove weapons (e.g., guns, rifles, knives), all items previously/currently identified as safety concern.    Remove drugs/medications (over-the-counter, prescriptions, illicit drugs), all items previously/currently identified as a safety concern.  The family member/significant other verbalizes understanding of the suicide prevention education information provided.  The family member/significant other agrees to remove the items of safety concern listed above.The patient did not endorse SI at the time of admission, nor did the patient c/o SI during the stay here.  SPE not required.  Mother states that she now realizes that pt was not taking meds as prescribed and that she will hold and administer meds when pt returns home.    Lindsey RogueRodney B Sixto Richmond 05/08/2017, 3:39 PM

## 2017-05-09 LAB — LIPID PANEL
Cholesterol: 150 mg/dL (ref 0–200)
HDL: 33 mg/dL — AB (ref >40)
LDL CALC: 106 mg/dL — AB (ref 0–99)
Total CHOL/HDL Ratio: 4.5 RATIO
Triglycerides: 53 mg/dL (ref <150)
VLDL: 11 mg/dL (ref 0–40)

## 2017-05-09 LAB — HEMOGLOBIN A1C
HEMOGLOBIN A1C: 4.9 % (ref 4.8–5.6)
Mean Plasma Glucose: 93.93 mg/dL

## 2017-05-09 NOTE — Progress Notes (Signed)
1:1 Note 1745  Patient presently laying on Right side with eyes closed.  Has told MHT that she will eat dinner later.  Respirations even and unlabored.  No signs/symptoms of pain/distress noted on patient's face/body movements.  Safety maintained with 1:1 per MD order.

## 2017-05-09 NOTE — Progress Notes (Signed)
1:1 Note 0830  Patient came to medication window this morning for medications.  1:1 present for safety.  Patient told nurse that "you're going to hell, repent...Marland Kitchen.Marland Kitchen."  Patient is very slow to respond to nurse's questions.  Slow movements.  Respirations even and unlabored.  No signs/symptoms of pain/distress noted on patient's face/body movements.  1:1 continues for safety.

## 2017-05-09 NOTE — Progress Notes (Signed)
1:1 Note 1930  Patient laying in bed with eyes closed.  Respirations even and unlabored.  No signs/symptoms of pain/distress noted on patient's face/body movements.  Safety maintained with 1:1 per MD order.

## 2017-05-09 NOTE — Progress Notes (Addendum)
1:1 note 1335  Patient's self inventory sheet, patient has poor sleep, no sleep medication given.  Poor appetite, low energy level, good concentration.  Rated depression 8, hopeless #7, anxiety #6.  Withdrawals, cravings, sedation, irritability.  Denied SI. Physical problems, pain, neck.  Physical pain neck, worst pain #4 in past 24 hours.  No pain medication. Goal is discharge.  Plans to follow rules.  Does have discharge plans. 1:1 continues for safety per MD order.

## 2017-05-09 NOTE — Plan of Care (Signed)
Problem: Education: Goal: Will be free of psychotic symptoms Outcome: Progressing Nurse discussed depression/anxiety/coping skills with patient.   

## 2017-05-09 NOTE — Tx Team (Signed)
Interdisciplinary Treatment and Diagnostic Plan Update  05/09/2017 Time of Session: 8:22 AM  Lindsey Richmond MRN: [id number]  Principal Diagnosis: Schizophrenia, paranoid ([place])  Secondary Diagnoses: Principal Problem:   Schizophrenia, paranoid ([place])   Current Medications:  Current Facility-Administered Medications  Medication Dose Route Frequency Provider Last Rate Last Dose  . acetaminophen (TYLENOL) tablet 650 mg  650 mg Oral Q6H PRN Ethelene Hal, NP      . alum & mag hydroxide-simeth (MAALOX/MYLANTA) 200-200-20 MG/5ML suspension 30 mL  30 mL Oral Q4H PRN Ethelene Hal, NP      . ARIPiprazole (ABILIFY) tablet 5 mg  5 mg Oral QHS Ursula Alert, MD   5 mg at 05/08/17 2131  . ARIPiprazole (ABILIFY) tablet 5 mg  5 mg Oral Daily Eappen, Saramma, MD   5 mg at 05/09/17 0800  . benztropine (COGENTIN) tablet 0.5 mg  0.5 mg Oral QHS Eappen, Saramma, MD   0.5 mg at 05/08/17 2131  . benztropine (COGENTIN) tablet 0.5 mg  0.5 mg Oral Daily Eappen, Saramma, MD   0.5 mg at 05/09/17 0800  . divalproex (DEPAKOTE ER) 24 hr tablet 1,000 mg  1,000 mg Oral QHS Eappen, Saramma, MD   1,000 mg at 05/08/17 2132  . hydrOXYzine (ATARAX/VISTARIL) tablet 25 mg  25 mg Oral Q6H PRN Ethelene Hal, NP      . LORazepam (ATIVAN) tablet 1 mg  1 mg Oral Q6H PRN Ursula Alert, MD   1 mg at 05/09/17 0802   Or  . LORazepam (ATIVAN) injection 1 mg  1 mg Intramuscular Q6H PRN Ursula Alert, MD   1 mg at 05/08/17 1302  . magnesium hydroxide (MILK OF MAGNESIA) suspension 30 mL  30 mL Oral Daily PRN Ethelene Hal, NP      . OLANZapine [facility]) tablet 5 mg  5 mg Oral TID PRN Ursula Alert, MD       Or  . OLANZapine (ZYPREXA) injection 5 mg  5 mg Intramuscular TID PRN Ursula Alert, MD      . traZODone (DESYREL) tablet 50 mg  50 mg Oral QHS Ethelene Hal, NP   50 mg at 05/08/17 2131    PTA Medications: Prescriptions Prior to Admission  Medication Sig Dispense Refill Last Dose  .  benztropine (COGENTIN) 0.5 MG tablet Take 1 tablet (0.5 mg total) by mouth daily as needed for tremors. 30 tablet 0   . divalproex (DEPAKOTE ER) 500 MG 24 hr tablet Take 2 tablets (1,000 mg total) by mouth at bedtime. For mood stabilization 60 tablet 0   . LORazepam (ATIVAN) 2 MG tablet Take 1 tablet (2 mg total) by mouth every 6 (six) hours as needed for anxiety (agitation. Please hold for oversedation.). 12 tablet 0   . risperiDONE (RISPERDAL M-TABS) 1 MG disintegrating tablet Take 1 tablet (1 mg total) by mouth 2 (two) times daily as needed (agitation). 60 tablet 0   . risperiDONE (RISPERDAL M-TABS) 3 MG disintegrating tablet Take 1 tablet (3 mg total) by mouth at bedtime. For mood control 30 tablet 0     Patient Stressors: Financial difficulties Medication change or noncompliance  Patient Strengths: General fund of knowledge Physical Health Supportive family/friends  Treatment Modalities: Medication Management, Group therapy, Case management,  1 to 1 session with clinician, Psychoeducation, Recreational therapy.   Physician Treatment Plan for Primary Diagnosis: Schizophrenia, paranoid ([place]) Long Term Goal(s): Improvement in symptoms so as ready for discharge  Short Term Goals: Ability to identify changes in lifestyle to  reduce recurrence of condition will improve Ability to verbalize feelings will improve Compliance with prescribed medications will improve Ability to identify changes in lifestyle to reduce recurrence of condition will improve Ability to verbalize feelings will improve Compliance with prescribed medications will improve  Medication Management: Evaluate patient's response, side effects, and tolerance of medication regimen.  Therapeutic Interventions: 1 to 1 sessions, Unit Group sessions and Medication administration.  Evaluation of Outcomes: Progressing  Physician Treatment Plan for Secondary Diagnosis: Principal Problem:   Schizophrenia, paranoid ([place])   Long  Term Goal(s): Improvement in symptoms so as ready for discharge  Short Term Goals: Ability to identify changes in lifestyle to reduce recurrence of condition will improve Ability to verbalize feelings will improve Compliance with prescribed medications will improve Ability to identify changes in lifestyle to reduce recurrence of condition will improve Ability to verbalize feelings will improve Compliance with prescribed medications will improve  Medication Management: Evaluate patient's response, side effects, and tolerance of medication regimen.  Therapeutic Interventions: 1 to 1 sessions, Unit Group sessions and Medication administration.  Evaluation of Outcomes: Progressing   RN Treatment Plan for Primary Diagnosis: Schizophrenia, paranoid ([place]) Long Term Goal(s): Knowledge of disease and therapeutic regimen to maintain health will improve  Short Term Goals: Ability to identify and develop effective coping behaviors will improve and Compliance with prescribed medications will improve  Medication Management: RN will administer medications as ordered by provider, will assess and evaluate patient's response and provide education to patient for prescribed medication. RN will report any adverse and/or side effects to prescribing provider.  Therapeutic Interventions: 1 on 1 counseling sessions, Psychoeducation, Medication administration, Evaluate responses to treatment, Monitor vital signs and CBGs as ordered, Perform/monitor CIWA, COWS, AIMS and Fall Risk screenings as ordered, Perform wound care treatments as ordered.  Evaluation of Outcomes: Progressing   LCSW Treatment Plan for Primary Diagnosis: Schizophrenia, paranoid ([place]) Long Term Goal(s): Safe transition to appropriate next level of care at discharge, Engage patient in therapeutic group addressing interpersonal concerns.  Short Term Goals: Engage patient in aftercare planning with referrals and resources  Therapeutic  Interventions: Assess for all discharge needs, 1 to 1 time with Social worker, Explore available resources and support systems, Assess for adequacy in community support network, Educate family and significant other(s) on suicide prevention, Complete Psychosocial Assessment, Interpersonal group therapy.  Evaluation of Outcomes: Met  Return home, follow up Neuropsychiatric Care   Progress in Treatment: Attending groups: Yes Participating in groups: Yes Taking medication as prescribed: Yes Toleration medication: Yes, no side effects reported at this time Family/Significant other contact made: Yes Patient understands diagnosis: Yes AEB asking for help with symptoms Discussing patient identified problems/goals with staff: Yes Medical problems stabilized or resolved: Yes Denies suicidal/homicidal ideation: Yes Issues/concerns per patient self-inventory: None Other: N/A  New problem(s) identified: None identified at this time.   New Short Term/Long Term Goal(s): "I have a lot of anxiety all the time, and I feel a little confused."  Decrease anxiety and confusion   Discharge Plan or Barriers:   Reason for Continuation of Hospitalization: Disorganization Paranoia Medication stabilization   Estimated Length of Stay: 3-5 days  Attendees: Patient: Lindsey Richmond 05/09/2017  8:22 AM  Physician: Ursula Alert, MD 05/09/2017  8:22 AM  Nursing: Grayland Ormond RN 05/09/2017  8:22 AM  RN Care Manager: Lars Pinks, RN 05/09/2017  8:22 AM  Social Worker: Ripley Fraise 05/09/2017  8:22 AM  Recreational Therapist: Winfield Cunas 05/09/2017  8:22 AM  Other: Norberto Sorenson 05/09/2017  8:22 AM  Other:  05/09/2017  8:22 AM    Scribe for Treatment Team:  Roque Lias LCSW 05/09/2017 8:22 AM

## 2017-05-09 NOTE — Progress Notes (Signed)
1:1 Note 1230  Patient has been sitting on side of bed this morning, lays down on bed, then sits up again.  Patient has been talking to herself intermittently throughout the morning.  Respirations even and unlabored.  No signs/symptoms of pain/distress noted on patient's face/body movements.  Patient did fill out her self inventory sheet.  1:1 continues per MD order for safety.

## 2017-05-09 NOTE — Progress Notes (Signed)
1:1 Note 1045  MD informed that patient is confused, slow to respond.  Patient has been in her room with 1:1 present.  Respirations even and unlabored.  No signs/symptoms of pain/distress noted on patient's face/body movements.  1:1 continues for patient's safety.

## 2017-05-09 NOTE — Progress Notes (Signed)
1:1 Note 1700  Patient has been laying in bed this afternoon, walking in hallway at times, going into dayroom occasionally with 1:1 present for safety.  Patient ate one vegetable at lunch time.  Has been encouraged throughout the day to drink fluids, water, gatorade has been given to patient.  Patient has urinated several times throughout the day.  Patient has tried to call her family several times throughout the day, but no answer.   Respirations even and unlabored.  No signs/symptoms of pain/distress noted on patient's face/body movements.  1:1 continues per MD order for safety.

## 2017-05-09 NOTE — Progress Notes (Signed)
Recreation Therapy Notes  Date: 05/09/2017 Time: 10:00am Location: [address] Dayroom  Group Topic: Goal Setting  Goal Area(s) Addresses:  Patient will be able to identify at least three goals they can work towards. Patient will be able to identify at least two obstacles for each goal they may run into.  Intervention: Construction paper, markers  Activity: Pt will be asked to identify goals they would like to accomplish in the future. Pt was asked to identify the date they plan on working towards that goal and obstacles they may face while trying to achieve each goal.  Education: Goal-Setting , Discharge Planning  Education Outcome: Acknowledges understanding  Clinical Observations/Feedback: Pt did not attend group.  Rachel Meyer, Recreation Therapy Intern   Andrika Peraza, LRT/CTRS 

## 2017-05-09 NOTE — Progress Notes (Signed)
[facility] MD Progress Note  05/09/2017 2:27 PM Lindsey Richmond  MRN:  [id number] Subjective: Patient states " I am hearing Jesus.'  Objective:Patient seen and chart reviewed.Discussed patient with treatment team.  Pt seen in bed , poor eye contact , continues to be limited when questions asked. She has a constricted affect, appears to be internally preoccupied . Pt continues to have trouble participating in milieu as well as is observed as intrusive and confused at times and hence continues to need 1:1 precaution for safety . Pt tolerating Abilify well.   Principal Problem: Schizophrenia, paranoid (H[facility]) Diagnosis:   Patient Active Problem List   Diagnosis Date Noted  . Schizophrenia, paranoid (H[facility]) [F20.0] 04/20/2017    Priority: High   Total Time spent with patient: 25 minutes  Past Psychiatric History: Please see H&P.   Past Medical History: Please see H&P.  Family History:  Family History  Problem Relation Age of Onset  . Heart failure Mother   . Hypertension Mother   . Diabetes Father    Family Psychiatric  History: Please see H&P.  Social History: Please see H&P.  History  Alcohol Use No     History  Drug Use No    Social History   Social History  . Marital status: Single    Spouse name: N/A  . Number of children: N/A  . Years of education: N/A   Social History Main Topics  . Smoking status: Never Smoker  . Smokeless tobacco: Never Used  . Alcohol use No  . Drug use: No  . Sexual activity: Yes    Birth control/ protection: Pill   Other Topics Concern  . None   Social History Narrative  . None   Additional Social History:                         Sleep: Fair  Appetite:  Fair  Current Medications: Current Facility-Administered Medications  Medication Dose Route Frequency Provider Last Rate Last Dose  . acetaminophen (TYLENOL) tablet 650 mg  650 mg Oral Q6H PRN Laveda Abbe, NP      . alum & mag hydroxide-simeth (MAALOX/MYLANTA)  200-200-20 MG/5ML suspension 30 mL  30 mL Oral Q4H PRN Laveda Abbe, NP      . ARIPiprazole (ABILIFY) tablet 5 mg  5 mg Oral QHS Jomarie Longs, MD   5 mg at 05/08/17 2131  . ARIPiprazole (ABILIFY) tablet 5 mg  5 mg Oral Daily Adarryl Goldammer, MD   5 mg at 05/09/17 0800  . benztropine (COGENTIN) tablet 0.5 mg  0.5 mg Oral QHS Brendan Gadson, MD   0.5 mg at 05/08/17 2131  . benztropine (COGENTIN) tablet 0.5 mg  0.5 mg Oral Daily Ariah Mower, MD   0.5 mg at 05/09/17 0800  . divalproex (DEPAKOTE ER) 24 hr tablet 1,000 mg  1,000 mg Oral QHS Trent Theisen, MD   1,000 mg at 05/08/17 2132  . hydrOXYzine (ATARAX/VISTARIL) tablet 25 mg  25 mg Oral Q6H PRN Laveda Abbe, NP      . LORazepam (ATIVAN) tablet 1 mg  1 mg Oral Q6H PRN Jomarie Longs, MD   1 mg at 05/09/17 0802   Or  . LORazepam (ATIVAN) injection 1 mg  1 mg Intramuscular Q6H PRN Jomarie Longs, MD   1 mg at 05/08/17 1302  . magnesium hydroxide (MILK OF MAGNESIA) suspension 30 mL  30 mL Oral Daily PRN Laveda Abbe, NP      .  OLANZapine (ZYPREXA) tablet 5 mg  5 mg Oral TID PRN Jomarie Longs, MD       Or  . OLANZapine (ZYPREXA) injection 5 mg  5 mg Intramuscular TID PRN Jomarie Longs, MD      . traZODone (DESYREL) tablet 50 mg  50 mg Oral QHS Laveda Abbe, NP   50 mg at 05/08/17 2131    Lab Results:  Results for orders placed or performed during the hospital encounter of 05/07/17 (from the past 48 hour(s))  Lipid panel     Status: Abnormal   Collection Time: 05/09/17  6:23 AM  Result Value Ref Range   Cholesterol 150 0 - 200 mg/dL   Triglycerides 53 <161 mg/dL   HDL 33 (L) >09 mg/dL   Total CHOL/HDL Ratio 4.5 RATIO   VLDL 11 0 - 40 mg/dL   LDL Cholesterol 604 (H) 0 - 99 mg/dL    Comment:        Total Cholesterol/HDL:CHD Risk Coronary Heart Disease Risk Table                     Men   Women  1/2 Average Risk   3.4   3.3  Average Risk       5.0   4.4  2 X Average Risk   9.6   7.1  3 X  Average Risk  23.4   11.0        Use the calculated Patient Ratio above and the CHD Risk Table to determine the patient's CHD Risk.        ATP III CLASSIFICATION (LDL):  <100     mg/dL   Optimal  540-981  mg/dL   Near or Above                    Optimal  130-159  mg/dL   Borderline  191-478  mg/dL   High  >295     mg/dL   Very High Performed at [facility], [address], [place], [place] [address]   Hemoglobin A1c     Status: None   Collection Time: 05/09/17  6:23 AM  Result Value Ref Range   Hgb A1c MFr Bld 4.9 4.8 - 5.6 %    Comment: (NOTE) Pre diabetes:          5.7%-6.4% Diabetes:              >6.4% Glycemic control for   <7.0% adults with diabetes    Mean Plasma Glucose 93.93 mg/dL    Comment: Performed at [facility], [address] [place], [place] [address]    Blood Alcohol level:  Lab Results  Component Value Date   [facility] <5 05/05/2017   ETH <5 04/17/2017    Metabolic Disorder Labs: Lab Results  Component Value Date   HGBA1C 4.9 05/09/2017   MPG 93.93 05/09/2017   No results found for: PROLACTIN Lab Results  Component Value Date   CHOL 150 05/09/2017   TRIG 53 05/09/2017   HDL 33 (L) 05/09/2017   CHOLHDL 4.5 05/09/2017   VLDL 11 05/09/2017   LDLCALC 106 (H) 05/09/2017    Physical Findings: AIMS: Facial and Oral Movements Muscles of Facial Expression: None, normal Lips and Perioral Area: None, normal Jaw: None, normal Tongue: None, normal,Extremity Movements Upper (arms, wrists, hands, fingers): None, normal Lower (legs, knees, ankles, toes): None, normal, Trunk Movements Neck, shoulders, hips: None, normal, Overall Severity Severity of abnormal  movements (highest score from questions above): None, normal Incapacitation due to abnormal movements: None, normal Patient's awareness of abnormal movements (rate only patient's report): No Awareness, Dental Status Current problems with teeth and/or dentures?: No Does patient usually  wear dentures?: No  CIWA:  CIWA-Ar Total: 1 COWS:  COWS Total Score: 2  Musculoskeletal: Strength & Muscle Tone: within normal limits Gait & Station: normal Patient leans: N/A  Psychiatric Specialty Exam: Physical Exam  Nursing note and vitals reviewed.   Review of Systems  Psychiatric/Behavioral: Positive for hallucinations. The patient is nervous/anxious.   All other systems reviewed and are negative.   Blood pressure 117/61, pulse 82, temperature 98.2 F (36.8 C), temperature source Oral, resp. rate 16, height 5\' 4"  (1.626 m), weight 83.5 kg (184 lb).Body mass index is 31.58 kg/m.  General Appearance: Guarded  Eye Contact:  Minimal  Speech:  Blocked and Slow  Volume:  Decreased  Mood:  Anxious and Dysphoric  Affect:  Constricted  Thought Process:  Disorganized, Irrelevant and Descriptions of Associations: Circumstantial  Orientation:  Full (Time, Place, and Person)  Thought Content:  Delusions, Hallucinations: Auditory and Rumination  Suicidal Thoughts:  No  Homicidal Thoughts:  No  Memory:  Immediate;   Fair Recent;   Fair Remote;   Poor  Judgement:  Impaired  Insight:  Shallow  Psychomotor Activity:  Decreased  Concentration:  Concentration: Poor and Attention Span: Poor  Recall:  [employer]Fair  Fund of Knowledge:  Fair  Language:  Fair  Akathisia:  No  Handed:  Right  AIMS (if indicated):     Assets:  Social Support  ADL's:  Impaired  Cognition:  Impaired,  Mild  Sleep:  Number of Hours: 6.25     Treatment Plan Summary:Patient with schizophrenia , noncompliance with medications, continues to be disorganized , delusional and has thought blocking - continues to need medication management.  Will continue today 05/09/17  plan as below except where it is noted.  Daily contact with patient to assess and evaluate symptoms and progress in treatment, Medication management and Plan see below  Continue Abilify 5 mg po bid for psychosis. Plan to DC on LAI  Abilify,M. Continue Depakote ER 1000 mg po qhs for mood sx. Depakote level due - 05/12/2017. Hba1c- wnl , lipid panel - HDL -low , diet management. Continue 1;1 precaution for safety. CSW will continue to work on disposition.     Venetia Prewitt, MD 05/09/2017, 2:27 PM

## 2017-05-09 NOTE — Progress Notes (Signed)
I:I Nursing Note : Patient had a better night than the previous night. She slept for 6.5 hours. Patient remain delusional and religiously preoccupied. " Why are you so dark, you are demonically processed, get out, get out, get out of my head".She remained out of touch with reality. I:L continues for safety.

## 2017-05-09 NOTE — Progress Notes (Signed)
Adult Psychoeducational Group Note  Date:  05/09/2017 Time:  2:21 AM  Group Topic/Focus:  Wrap-Up Group:   The focus of this group is to help patients review their daily goal of treatment and discuss progress on daily workbooks.  Participation Level:  Did Not Attend   Additional Comments: Pt did not attend group, pt remained in her room. Pt encouraged to attend group. Karleen HampshireFox, Melodye Swor Brittini 05/09/2017, 2:21 AM

## 2017-05-09 NOTE — Progress Notes (Signed)
I:I Nursing Note: Patient resting quietly with eyes closed. Respirations even and unlabored. No distress noted. !:1 observation continues for safety.

## 2017-05-10 DIAGNOSIS — G4700 Insomnia, unspecified: Secondary | ICD-10-CM

## 2017-05-10 DIAGNOSIS — R443 Hallucinations, unspecified: Secondary | ICD-10-CM

## 2017-05-10 DIAGNOSIS — F1910 Other psychoactive substance abuse, uncomplicated: Secondary | ICD-10-CM

## 2017-05-10 DIAGNOSIS — Z9114 Patient's other noncompliance with medication regimen: Secondary | ICD-10-CM

## 2017-05-10 DIAGNOSIS — F200 Paranoid schizophrenia: Principal | ICD-10-CM

## 2017-05-10 DIAGNOSIS — F419 Anxiety disorder, unspecified: Secondary | ICD-10-CM

## 2017-05-10 LAB — PROLACTIN: PROLACTIN: 102 ng/mL — AB (ref 4.8–23.3)

## 2017-05-10 NOTE — Progress Notes (Signed)
[facility] Post 1:1 Observation Documentation  For the first (8) hours following discontinuation of 1:1 precautions, a progress note entry by nursing staff should be documented at least every 2 hours, reflecting the patient's behavior, condition, mood, and conversation.  Use the progress notes for additional entries.  Time 1:1 discontinued:  10:00 am   Patient's Behavior:  Patient is currently in group.  Patient is engaged in group activities without incident.  Patient has had no incident of behavioral dyscontrol.    Patient's Condition:  Patient is without harm to self/others.    Patient's Conversation:  Patient is actively participating in groups.  Patient denies SI, HI, and AVH.   Jerrye BushyLaRonica R Salima Rumer 05/10/2017, 10:17 AM

## 2017-05-10 NOTE — Progress Notes (Signed)
For the first (8) hours following discontinuation of 1:1 precautions, a progress note entry by nursing staff should be documented at least every 2 hours, reflecting the patient's behavior, condition, mood, and conversation.  Use the progress notes for additional entries.  Time 1:1 discontinued:  10:00 am   Patient's Behavior: Patient is currently in her room resting.  Patient has been free of harm to self and others. Patient has had a decrease in bizarre behaviors and denies psychotic symptoms  Patient's Condition:  Patient is without harm to self/others.     Patient's Conversation:  Patient is actively participating in groups.  Patient denies SI, HI, and AVH.

## 2017-05-10 NOTE — [facility] Group Notes (Signed)
[facility] LCSW Group Therapy  05/10/2017 4:13 PM   Type of Therapy:  Group Therapy  Participation Level:  Active  Participation Quality:  Attentive  Affect:  Appropriate  Cognitive:  Appropriate  Insight:  Improving  Engagement in Therapy:  Engaged  Modes of Intervention:  Clarification, Education, Exploration and Socialization  Summary of Progress/Problems: Today's group focused on relapse prevention.  We defined the term, and then brainstormed on ways to prevent relapse. Stayed the entire time, engaged throughout.  Still lots of religious overtones, but also able to expand beyond that to talk about the importance of positive self talk, rebounding from falls, and the importance of resilience.  Daryel Geraldorth, Kauan Kloosterman B 05/10/2017 , 4:13 PM

## 2017-05-10 NOTE — Progress Notes (Signed)
For the first (8) hours following discontinuation of 1:1 precautions, a progress note entry by nursing staff should be documented at least every 2 hours, reflecting the patient's behavior, condition, mood, and conversation.  Use the progress notes for additional entries.  Time 1:1 discontinued:  10:00 am   Patient's Behavior: Patient is currently in her room resting.  Patient has been free of harm to self and others. Patient has had a decrease in bizarre behaviors and denies psychotic symptoms  Patient's Condition:  Patient is without harm to self/others.     Patient's Conversation:  Patient is actively participating in groups.  Patient denies SI, HI, and AVH.   

## 2017-05-10 NOTE — Progress Notes (Signed)
Recreation Therapy Notes  Date: 05/10/17 Time: 1000 Location: 500 Hall Dayroom  Group Topic: Stress Management  Goal Area(s) Addresses:  Patient will verbalize importance of using healthy stress management.  Patient will identify positive emotions associated with healthy stress management.   Behavioral Response: Engaged  Intervention: 2 beach balls, chairs  Activity : Group Juggle.  Patients were placed in Richmond circle.  Patients were given one ball to toss back and forth to each other.  LRT would time the patients to see how long they could keep the ball going.  Once patients were able to keep that one ball going, LRT added another ball to the rotation.  Patients were to keep both balls going without the balls coming to Richmond stop.  If balls came to Richmond stop, LRT would start time over.  Education:  Stress Management, Discharge Planning.   Education Outcome: Acknowledges edcuation/In group clarification offered/Needs additional education  Clinical Observations/Feedback: Pt was appropriate during group.  Pt stated stress makes you feel anxious.  Pt also expressed that she deals with stress by going outside.  At one point, pt told group "God loves you" but was able to be redirected.   Lindsey RancherMarjette Meenakshi Richmond, LRT/CTRS         Lindsey Richmond, Lindsey Richmond 05/10/2017 11:12 AM

## 2017-05-10 NOTE — Progress Notes (Signed)
1:1 Note  Pt at this time in lying in bed with earplugs in both ears and index fingers over the plugs. Pt at this time complained of auditory hallucinations; "I need my shot; I need this voices to stop." 1:1 staff is present in room with Pt at this time. 1:1 monitoring continues for Pt's safety. 15-minute safety checks also continues at this time. Support, encouragement, and safe environment provided. Pt willingly accepted PRN medications.

## 2017-05-10 NOTE — Progress Notes (Signed)
1:1 Note  Pt at this time in lying in bed singing with earplugs in both ears and index fingers over the plugs. Pt refused to answer any questions and refused to take any of her medications-just kept on singing. Pt does not look to be in any distress at this time. 1:1 staff is present in room with Pt at this time. 1:1 monitoring continues for Pt's safety. 15-minute safety checks also continues at this time. Support, encouragement, and safe environment provided.

## 2017-05-10 NOTE — Progress Notes (Signed)
Recreation Therapy Notes  05/09/2017 Intern was unable to assess pt due to pt due to acute psychosis. Pt was walking around telling pts to repent and then pt went back into her room to sleep. Intern will attempt to assess again 05/10/2017.  Marvell Fullerachel Meyer, Recreation Therapy Intern   Caroll RancherMarjette Giancarlos Berendt, LRT/CTRS  Marvell FullerRachel Meyer 05/10/2017 8:35 AM

## 2017-05-10 NOTE — Progress Notes (Signed)
[facility] MD Progress Note  05/10/2017 1:44 PM Lindsey Richmond  MRN:  [id number]  Subjective: Lindsey Richmond reports, "I'm doing alright. I just was making plans on where to go after discharge. I plan to go to the [facility]. I will also be working at [employer] while being kind to people".  Objective: Lindsey Richmond is seen and chart reviewed. Discussed patient with the treatment team. Pt seen in the day room attending group sessions, poor eye contact, remains tangential as well circumstantial when questions are asked. She has a constricted affect, appears to be internally preoccupied. Pt is in improved in participating in milieu. She remains delusional. Her 1:1 precaution for safety was discontinue this A. M. She is tolerating Abilify well.No adverse reactions reported.  Principal Problem: Schizophrenia, paranoid (HCC)  Diagnosis:   Patient Active Problem List   Diagnosis Date Noted  . Schizophrenia, paranoid (HCC) [F20.0] 04/20/2017    Priority: High   Total Time spent with patient: 15 minutes  Past Psychiatric History: Paranoid Schizophrenia.   Past Medical History: See H&P.  Family History:  Family History  Problem Relation Age of Onset  . Heart failure Mother   . Hypertension Mother   . Diabetes Father    Family Psychiatric  History: Please see H&P.  Social History: Please see H&P.  History  Alcohol Use No     History  Drug Use No    Social History   Social History  . Marital status: Single    Spouse name: N/A  . Number of children: N/A  . Years of education: N/A   Social History Main Topics  . Smoking status: Never Smoker  . Smokeless tobacco: Never Used  . Alcohol use No  . Drug use: No  . Sexual activity: Yes    Birth control/ protection: Pill   Other Topics Concern  . None   Social History Narrative  . None   Additional Social History:   Sleep: Fair  Appetite:  Fair  Current Medications: Current Facility-Administered Medications  Medication Dose Route  Frequency Provider Last Rate Last Dose  . acetaminophen (TYLENOL) tablet 650 mg  650 mg Oral Q6H PRN Laveda Abbe, NP      . alum & mag hydroxide-simeth (MAALOX/MYLANTA) 200-200-20 MG/5ML suspension 30 mL  30 mL Oral Q4H PRN Laveda Abbe, NP      . ARIPiprazole (ABILIFY) tablet 5 mg  5 mg Oral QHS Jomarie Longs, MD   5 mg at 05/08/17 2131  . ARIPiprazole (ABILIFY) tablet 5 mg  5 mg Oral Daily Jomarie Longs, MD   5 mg at 05/10/17 0814  . benztropine (COGENTIN) tablet 0.5 mg  0.5 mg Oral QHS Eappen, Saramma, MD   0.5 mg at 05/08/17 2131  . benztropine (COGENTIN) tablet 0.5 mg  0.5 mg Oral Daily Eappen, Saramma, MD   0.5 mg at 05/10/17 0814  . divalproex (DEPAKOTE ER) 24 hr tablet 1,000 mg  1,000 mg Oral QHS Eappen, Saramma, MD   1,000 mg at 05/08/17 2132  . hydrOXYzine (ATARAX/VISTARIL) tablet 25 mg  25 mg Oral Q6H PRN Laveda Abbe, NP      . LORazepam (ATIVAN) tablet 1 mg  1 mg Oral Q6H PRN Jomarie Longs, MD   1 mg at 05/09/17 1817   Or  . LORazepam (ATIVAN) injection 1 mg  1 mg Intramuscular Q6H PRN Jomarie Longs, MD   1 mg at 05/10/17 0302  . magnesium hydroxide (MILK OF MAGNESIA) suspension 30 mL  30  mL Oral Daily PRN Laveda Abbe, NP      . OLANZapine [facility]) tablet 5 mg  5 mg Oral TID PRN Jomarie Longs, MD   5 mg at 05/10/17 0154   Or  . OLANZapine (ZYPREXA) injection 5 mg  5 mg Intramuscular TID PRN Jomarie Longs, MD      . traZODone (DESYREL) tablet 50 mg  50 mg Oral QHS Laveda Abbe, NP   50 mg at 05/10/17 0155   Lab Results:  Results for orders placed or performed during the hospital encounter of 05/07/17 (from the past 48 hour(s))  Lipid panel     Status: Abnormal   Collection Time: 05/09/17  6:23 AM  Result Value Ref Range   Cholesterol 150 0 - 200 mg/dL   Triglycerides 53 <098 mg/dL   HDL 33 (L) >11 mg/dL   Total CHOL/HDL Ratio 4.5 RATIO   VLDL 11 0 - 40 mg/dL   LDL Cholesterol 914 (H) 0 - 99 mg/dL    Comment:         Total Cholesterol/HDL:CHD Risk Coronary Heart Disease Risk Table                     Men   Women  1/2 Average Risk   3.4   3.3  Average Risk       5.0   4.4  2 X Average Risk   9.6   7.1  3 X Average Risk  23.4   11.0        Use the calculated Patient Ratio above and the CHD Risk Table to determine the patient's CHD Risk.        ATP III CLASSIFICATION (LDL):  <100     mg/dL   Optimal  782-956  mg/dL   Near or Above                    Optimal  130-159  mg/dL   Borderline  213-086  mg/dL   High  >578     mg/dL   Very High Performed at [facility], [address] [place], [place] [address]   Hemoglobin A1c     Status: None   Collection Time: 05/09/17  6:23 AM  Result Value Ref Range   Hgb A1c MFr Bld 4.9 4.8 - 5.6 %    Comment: (NOTE) Pre diabetes:          5.7%-6.4% Diabetes:              >6.4% Glycemic control for   <7.0% adults with diabetes    Mean Plasma Glucose 93.93 mg/dL    Comment: Performed at [facility], [address] [place], [place] [address]  Prolactin     Status: Abnormal   Collection Time: 05/09/17  6:23 AM  Result Value Ref Range   Prolactin 102.0 (H) 4.8 - 23.3 ng/mL    Comment: (NOTE) Performed At: [facility] [address] [place], [place] [id number] Mila Homer MD [id number] Performed at [facility], [address], [place], [place] [address]    Blood Alcohol level:  Lab Results  Component Value Date   [facility] <5 05/05/2017   ETH <5 04/17/2017   Metabolic Disorder Labs: Lab Results  Component Value Date   HGBA1C 4.9 05/09/2017   MPG 93.93 05/09/2017   Lab Results  Component Value Date   PROLACTIN 102.0 (H) 05/09/2017   Lab Results  Component Value  Date   CHOL 150 05/09/2017   TRIG 53 05/09/2017   HDL 33 (L) 05/09/2017   CHOLHDL 4.5 05/09/2017   VLDL 11 05/09/2017   LDLCALC 106 (H) 05/09/2017   Physical Findings: AIMS: Facial and Oral Movements Muscles of Facial Expression:  None, normal Lips and Perioral Area: None, normal Jaw: None, normal Tongue: None, normal,Extremity Movements Upper (arms, wrists, hands, fingers): None, normal Lower (legs, knees, ankles, toes): None, normal, Trunk Movements Neck, shoulders, hips: None, normal, Overall Severity Severity of abnormal movements (highest score from questions above): None, normal Incapacitation due to abnormal movements: None, normal Patient's awareness of abnormal movements (rate only patient's report): No Awareness, Dental Status Current problems with teeth and/or dentures?: No Does patient usually wear dentures?: No  CIWA:  CIWA-Ar Total: 1 COWS:  COWS Total Score: 2  Musculoskeletal: Strength & Muscle Tone: within normal limits Gait & Station: normal Patient leans: N/A  Psychiatric Specialty Exam: Physical Exam  Nursing note and vitals reviewed.   Review of Systems  Psychiatric/Behavioral: Positive for hallucinations (Hyper-religious, delusions.) and substance abuse (UDS positive for benzodiazepine). Negative for memory loss. The patient is nervous/anxious and has insomnia.   All other systems reviewed and are negative.   Blood pressure 117/61, pulse 82, temperature 98.2 F (36.8 C), temperature source Oral, resp. rate 16, height 5\' 4"  (1.626 m), weight 83.5 kg (184 lb).Body mass index is 31.58 kg/m.  General Appearance: Guarded  Eye Contact:  Minimal  Speech:  Blocked and Slow  Volume:  Decreased  Mood:  Anxious and Dysphoric  Affect:  Constricted  Thought Process:  Disorganized, Irrelevant and Descriptions of Associations: Circumstantial  Orientation:  Full (Time, Place, and Person)  Thought Content:  Delusions, Hallucinations: Auditory and Rumination  Suicidal Thoughts:  No  Homicidal Thoughts:  No  Memory:  Immediate;   Fair Recent;   Fair Remote;   Poor  Judgement:  Impaired  Insight:  Shallow  Psychomotor Activity:  Decreased  Concentration:  Concentration: Poor and Attention  Span: Poor  Recall:  [employer]Fair  Fund of Knowledge:  Fair  Language:  Fair  Akathisia:  No  Handed:  Right  AIMS (if indicated):     Assets:  Social Support  ADL's:  Impaired  Cognition:  Impaired,  Mild  Sleep:  Number of Hours: 2.5   Treatment Plan Summary: Patient with schizophrenia , noncompliance with medications, continues to be disorganized , delusional and has thought blocking - continues to need medication management.  Will continue today 05/10/17  plan as below except where it is noted.  Daily contact with patient to assess and evaluate symptoms and progress in treatment, Medication management and Plan see below  -Continue Abilify 5 mg po bid for psychosis. Plan is to DC on LAI Abilify,M.  -Continue Depakote ER 1000 mg po qhs for mood symptoms. -Depakote level due - 05/12/2017.  -Reviewed Hgba1c results- wnl.  -Reviewed lipid panel result- HDL-low, LDL- high. diet management.  -Discontinue 1:1 precaution for safety, patient is improving. - CSW will continue to work on disposition.   Sanjuana KavaNwoko, Undrea Archbold I, NP, PMHNP, FNP-BC. 05/10/2017, 1:44 PMPatient ID: Lindsey Richmond, female   DOB: [birth year], 21 y.o.   MRN: [id number]

## 2017-05-10 NOTE — Progress Notes (Signed)
For the first (8) hours following discontinuation of 1:1 precautions, a progress note entry by nursing staff should be documented at least every 2 hours, reflecting the patient's behavior, condition, mood, and conversation. Use the progress notes for additional entries.  Time 1:1 discontinued:10:00 am   Patient's Behavior:Patient has been noted on the hall pacing.  Patient has reported increased anxiety and requested medications.  Patient has had a decrease in psychotic symptoms.   Patient's Condition:Patient is without harm to self/others.   Patient's Conversation:Patient is actively participating in groups. Patient denies SI, HI, and AVH.

## 2017-05-11 DIAGNOSIS — F29 Unspecified psychosis not due to a substance or known physiological condition: Secondary | ICD-10-CM

## 2017-05-11 DIAGNOSIS — F39 Unspecified mood [affective] disorder: Secondary | ICD-10-CM

## 2017-05-11 NOTE — [facility] Group Notes (Signed)
[facility] LCSW Group Therapy  05/11/2017  1:05 PM  Type of Therapy:  Group therapy  Participation Level:  Active  Participation Quality:  Attentive  Affect:  Flat  Cognitive:  Oriented  Insight:  Limited  Engagement in Therapy:  Limited  Modes of Intervention:  Discussion, Socialization  Summary of Progress/Problems:  Chaplain was here to lead a group on themes of hope and courage. Stayed the entire time, engaged throughout. "I have hope because of Jesus.  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  Daryel Geraldorth, Verdine Grenfell B 05/11/2017 3:17 PM

## 2017-05-11 NOTE — Progress Notes (Signed)
Nursing Note 05/11/2017 [id number]  Data Patient did not converse at all with RN today.  When RN would try to engage patient would shout "repent!" or "get away!" at times said "repent or you will burn in hell."  Irritable, remains in room most of day.  Kept on unit per nursing judgment due to bx.  Does not disclose HI, SI, AVH.  Hyperreligious, irritable, agitated.   Action Spoke with patient 1:1, nurse offered support to patient throughout shift.  Meds adjusted per MD.  Continues to be monitored on 15 minute checks for safety.  Response Remains safe on unit, though irritable.

## 2017-05-11 NOTE — Progress Notes (Signed)
[facility] MD Progress Note  05/11/2017 2:30 PM Lindsey Richmond  MRN:  [id number]  Subjective: Lindsey Richmond reports, "It is going well. Actually, my mood is excellent & vibrant because of Jesus".  Objective: Lindsey Richmond is seen and chart reviewed. Discussed patient with the treatment team. Lindsey Richmond is alert, seen in the day room attending group sessions, eye contact is fair. Lindsey Richmond remains tangential as well circumstantial when questions are asked. Her thought process is disorganized. Lindsey Richmond remains hyper-religious. Lindsey Richmond presents with a constricted affect, appears to be internally preoccupied. Pt is improved in participating in milieu. Lindsey Richmond remains delusional. Lindsey Richmond is no longer on the 1:1 supervision. Lindsey Richmond is tolerating Abilify well. No adverse reactions reported.  Principal Problem: Schizophrenia, paranoid (HCC)  Diagnosis:   Patient Active Problem List   Diagnosis Date Noted  . Schizophrenia, paranoid (HCC) [F20.0] 04/20/2017    Priority: High   Total Time spent with patient: 15 minutes  Past Psychiatric History: Paranoid Schizophrenia.   Past Medical History: See H&P.  Family History:  Family History  Problem Relation Age of Onset  . Heart failure Mother   . Hypertension Mother   . Diabetes Father    Family Psychiatric  History: Please see H&P.  Social History: Please see H&P.  History  Alcohol Use No     History  Drug Use No    Social History   Social History  . Marital status: Single    Spouse name: N/A  . Number of children: N/A  . Years of education: N/A   Social History Main Topics  . Smoking status: Never Smoker  . Smokeless tobacco: Never Used  . Alcohol use No  . Drug use: No  . Sexual activity: Yes    Birth control/ protection: Pill   Other Topics Concern  . None   Social History Narrative  . None   Additional Social History:   Sleep: Poor  Appetite:  Fair  Current Medications: Current Facility-Administered Medications  Medication Dose Route Frequency Provider Last Rate  Last Dose  . acetaminophen (TYLENOL) tablet 650 mg  650 mg Oral Q6H PRN Laveda Abbe, Lindsey Richmond      . alum & mag hydroxide-simeth (MAALOX/MYLANTA) 200-200-20 MG/5ML suspension 30 mL  30 mL Oral Q4H PRN Laveda Abbe, Lindsey Richmond      . ARIPiprazole (ABILIFY) tablet 5 mg  5 mg Oral QHS Jomarie Longs, MD   5 mg at 05/10/17 2121  . ARIPiprazole (ABILIFY) tablet 5 mg  5 mg Oral Daily Eappen, [name], MD   5 mg at 05/11/17 0826  . benztropine (COGENTIN) tablet 0.5 mg  0.5 mg Oral QHS Eappen, Saramma, MD   0.5 mg at 05/10/17 2121  . benztropine (COGENTIN) tablet 0.5 mg  0.5 mg Oral Daily Eappen, Saramma, MD   0.5 mg at 05/11/17 0826  . divalproex (DEPAKOTE ER) 24 hr tablet 1,000 mg  1,000 mg Oral QHS Eappen, Saramma, MD   1,000 mg at 05/10/17 2122  . hydrOXYzine (ATARAX/VISTARIL) tablet 25 mg  25 mg Oral Q6H PRN Laveda Abbe, Lindsey Richmond   25 mg at 05/10/17 1952  . LORazepam (ATIVAN) tablet 1 mg  1 mg Oral Q6H PRN Jomarie Longs, MD   1 mg at 05/11/17 1301   Or  . LORazepam (ATIVAN) injection 1 mg  1 mg Intramuscular Q6H PRN Jomarie Longs, MD   1 mg at 05/10/17 0302  . magnesium hydroxide (MILK OF MAGNESIA) suspension 30 mL  30 mL Oral Daily PRN Laveda Abbe, Lindsey Richmond      .  OLANZapine (ZYPREXA) tablet 5 mg  5 mg Oral TID PRN Jomarie Longs, MD   5 mg at 05/11/17 1302   Or  . OLANZapine (ZYPREXA) injection 5 mg  5 mg Intramuscular TID PRN Jomarie Longs, MD      . traZODone (DESYREL) tablet 50 mg  50 mg Oral QHS Laveda Abbe, Lindsey Richmond   50 mg at 05/10/17 2121   Lab Results:  No results found for this or any previous visit (from the past 48 hour(s)). Blood Alcohol level:  Lab Results  Component Value Date   ETH <5 05/05/2017   ETH <5 04/17/2017   Metabolic Disorder Labs: Lab Results  Component Value Date   HGBA1C 4.9 05/09/2017   MPG 93.93 05/09/2017   Lab Results  Component Value Date   PROLACTIN 102.0 (H) 05/09/2017   Lab Results  Component Value Date   CHOL 150  05/09/2017   TRIG 53 05/09/2017   HDL 33 (L) 05/09/2017   CHOLHDL 4.5 05/09/2017   VLDL 11 05/09/2017   LDLCALC 106 (H) 05/09/2017   Physical Findings: AIMS: Facial and Oral Movements Muscles of Facial Expression: None, normal Lips and Perioral Area: None, normal Jaw: None, normal Tongue: None, normal,Extremity Movements Upper (arms, wrists, hands, fingers): None, normal Lower (legs, knees, ankles, toes): None, normal, Trunk Movements Neck, shoulders, hips: None, normal, Overall Severity Severity of abnormal movements (highest score from questions above): None, normal Incapacitation due to abnormal movements: None, normal Patient's awareness of abnormal movements (rate only patient's report): No Awareness, Dental Status Current problems with teeth and/or dentures?: No Does patient usually wear dentures?: No  CIWA:  CIWA-Ar Total: 1 COWS:  COWS Total Score: 2  Musculoskeletal: Strength & Muscle Tone: within normal limits Gait & Station: normal Patient leans: N/A  Psychiatric Specialty Exam: Physical Exam  Nursing note and vitals reviewed.   Review of Systems  Psychiatric/Behavioral: Positive for hallucinations (Hyper-religious, delusions.) and substance abuse (UDS positive for benzodiazepine). Negative for memory loss. The patient is nervous/anxious and has insomnia.   All other systems reviewed and are negative.   Blood pressure 131/70, pulse (!) 110, temperature 98.6 F (37 C), temperature source Oral, resp. rate 20, height 5\' 4"  (1.626 m), weight 83.5 kg (184 lb).Body mass index is 31.58 kg/m.  General Appearance: Guarded  Eye Contact:  Minimal  Speech:  Blocked and Slow  Volume:  Decreased  Mood:  Anxious and Dysphoric  Affect:  Constricted  Thought Process:  Disorganized, Irrelevant and Descriptions of Associations: Circumstantial  Orientation:  Full (Time, Place, and Person)  Thought Content:  Delusions, Hallucinations: Auditory and Rumination  Suicidal Thoughts:   No  Homicidal Thoughts:  No  Memory:  Immediate;   Fair Recent;   Fair Remote;   Poor  Judgement:  Impaired  Insight:  Shallow  Psychomotor Activity:  Decreased  Concentration:  Concentration: Poor and Attention Span: Poor  Recall:  [employer] of Knowledge:  Fair  Language:  Fair  Akathisia:  No  Handed:  Right  AIMS (if indicated):     Assets:  Social Support  ADL's:  Impaired  Cognition:  Impaired,  Mild  Sleep:  Number of Hours: 2   Treatment Plan Summary: Patient with schizophrenia , noncompliance with medications, continues to be disorganized, delusional and has thought blocking - continues to need medication management.  Will continue today 05/11/17  plan as below except where it is noted.  Daily contact with patient to assess and evaluate symptoms and progress in  treatment, Medication management and Plan see below  -Continue Abilify 5 mg po bid for psychosis. Plan is to DC on LAI Abilify,M.  -Continue Depakote ER 1000 mg po qhs for mood symptoms. -Depakote level due - 05/12/2017.  -Reviewed Hgba1c results- wnl.  -Reviewed lipid panel result- HDL-low, LDL- high. diet management. - CSW will continue to work on disposition.   Sanjuana KavaNwoko, Ural Acree I, Lindsey Richmond, PMHNP, FNP-BC. 05/11/2017, 2:30 PMPatient ID: Lindsey Richmond, female   DOB: [birth year], 21 y.o.

## 2017-05-11 NOTE — Progress Notes (Signed)
Recreation Therapy Notes  Date: 05/11/2017 Time: 10:00am Location: 500 Hall Dayroom  Group Topic: Communication and Problem-Solving  Goal Area(s) Addresses:  Pts will be able to successfully work with their team members to reach a common goal with the materials they are provided.  Behavioral Response: Engaged  Intervention: Uncooked Spaghetti, masking tape and marshmallows  Activity: Pts were asked to work in a group to create a tower with spaghetti and masking tape that will hold a marshmallow on top.   Education: Communication, Problem-Solving, Discharge Planning  Education Outcome: Acknowledges understanding  Clinical Observations/Feedback: Pt actively participated in activity. Pt identified that the skills she needed to use in order to be successful within a team she needed to be patient, provide leadership and work together. Pt reported that she could use these skills to have a productive relationship within a work place.  Marvell Fullerachel Meyer, Recreation Therapy Intern  Caroll RancherMarjette Johann Gascoigne, LRT/CTRS

## 2017-05-11 NOTE — Progress Notes (Signed)
Recreation Therapy Notes  Patient admitted to unit 05/07/2017. Due to admission within last year, no new assessment conducted at this time. Last assessment conducted 04/19/2017. Patient reports no changes in stressors from previous admission. Patient reports catalyst for admission was because she was informing people that Jesus is coming bacj and that he is the only way to save our souls. ? Patient denies SI, HI, AVH at this time. Patient reports goal of admission it to talk about repentance.  ? Information found below from assessment conducted 04/19/2017.  Pt was previously admitted for professing to Jesus and God naked.  Pt reports that her coping skills still consist of isolation, substance abuse, exercise and art.  Pt reports her personal challenges as communication, concentration and stress management.  Pt reports her leisure interests as praying, jogging, drawing and painting.  Pt reports she is head strong and she can calculate well.  Pt reports she needs to improve on her "blunt personality."  Lindsey Fullerachel Richmond, Recreation Therapy Intern   Caroll RancherMarjette Ahan Richmond, LRT/CTRS       Lindsey Richmond 05/11/2017 10:48 AM

## 2017-05-11 NOTE — Progress Notes (Signed)
D: Pt denies SI/HI/AVh. Pt is pleasant and cooperative. Pt has minimal interaction. Pt denies AVH, but pt visibly responding during interaction , which is very little.   A: Pt was offered support and encouragement. Pt was given scheduled medications. Pt was encourage to attend groups. Q 15 minute checks were done for safety.   R: Pt is taking medication. Pt has no complaints.Pt receptive to treatment and safety maintained on unit.

## 2017-05-11 NOTE — Progress Notes (Signed)
DAR Note: Pt continue to be very, delusional, disorganized and hyper-religious; continually chants, "repent for the kingdom of god is at hand." Patient's responds and movements are very slow (catatonic like). Pt will not answer any assessment questions-continually chants repent to every question asked. Pt remains disoriented to time and situation. Medications offered as prescribed.  Support, encouragement, and safe environment provided. Pt was med compliant. 15-minute safety checks also continues at this time.

## 2017-05-11 NOTE — Plan of Care (Signed)
Problem: Safety: Goal: Periods of time without injury will increase Outcome: Progressing Pt safe on the unit at this time   

## 2017-05-11 NOTE — Progress Notes (Signed)
Adult Psychoeducational Group Note  Date:  05/11/2017 Time:  9:12 PM  Group Topic/Focus:  Wrap-Up Group:   The focus of this group is to help patients review their daily goal of treatment and discuss progress on daily workbooks.  Participation Level:  Minimal  Participation Quality:  Drowsy  Affect:  Lethargic  Cognitive:  Confused  Insight: Limited  Engagement in Group:  Engaged  Modes of Intervention:  Socialization and Support  Additional Comments:  Patient attended and participated in group tonight. She reports having a decent day. She went for group, her family visited. They talked about her next appointment with her outpatient doctor. Her meals were brought to her today.  Lita MainsFrancis, Rommie Dunn [place] DayDacosta 05/11/2017, 9:12 PM

## 2017-05-11 NOTE — Progress Notes (Cosign Needed)
Adult Psychoeducational Group Note  Date:  05/11/2017 Time:  1:46 AM  Group Topic/Focus:  Wrap-Up Group:   The focus of this group is to help patients review their daily goal of treatment and discuss progress on daily workbooks.  Participation Level:  Did Not Attend  Participation Quality:  Did not attend  Affect:  Did not attend  Cognitive:  Did not attend  Insight: None  Engagement in Group:  Did not attend  Modes of Intervention:  Did not attend  Additional Comments:  Pt did not attend evening wrap up group.  Lindsey FurnaceChristopher  [name] 05/11/2017, 1:46 AM

## 2017-05-12 MED ORDER — ZIPRASIDONE HCL 20 MG PO CAPS
ORAL_CAPSULE | ORAL | Status: AC
  Administered 2017-05-12: 11:00:00
  Filled 2017-05-12: qty 1

## 2017-05-12 MED ORDER — ZIPRASIDONE MESYLATE 20 MG IM SOLR: 10.0000 mg | Freq: Three times a day (TID) | INTRAMUSCULAR | Status: DC | PRN

## 2017-05-12 MED ORDER — ZIPRASIDONE HCL 20 MG PO CAPS
20.0000 mg | ORAL_CAPSULE | Freq: Once | ORAL | Status: AC
  Administered 2017-05-12: 20 mg via ORAL
  Filled 2017-05-12: qty 1

## 2017-05-12 MED ORDER — ZIPRASIDONE HCL 20 MG PO CAPS
20.0000 mg | ORAL_CAPSULE | Freq: Three times a day (TID) | ORAL | Status: DC | PRN
  Administered 2017-05-12 – 2017-05-13 (×3): 20 mg via ORAL
  Filled 2017-05-12 (×3): qty 1

## 2017-05-12 MED ORDER — ZIPRASIDONE HCL 20 MG PO CAPS
20.0000 mg | ORAL_CAPSULE | ORAL | Status: AC
  Administered 2017-05-12: 20 mg via ORAL
  Filled 2017-05-12: qty 1

## 2017-05-12 MED ORDER — ARIPIPRAZOLE 10 MG PO TABS
10.0000 mg | ORAL_TABLET | Freq: Every day | ORAL | Status: DC
  Administered 2017-05-12: 10 mg via ORAL
  Filled 2017-05-12 (×3): qty 1

## 2017-05-12 NOTE — [facility] Group Notes (Addendum)
  [facility] LCSW Group Therapy Note  Date/Time:  05/12/2017 11:15AM-12:00PM  Type of Therapy and Topic:  Group Therapy:  Feelings About Hospitalization  Participation Level:  Active   Description of Group This process group involved patients discussing their feelings related to being hospitalized, as well as the benefits they see to being in the hospital.  These feelings and benefits were itemized.  The group then brainstormed specific ways in which they could seek those same benefits when they discharge and return home.  These were listed on the whiteboard and when patients thought they would like to remember them and pursue them at discharge, they were encouraged to copy them down.  Therapeutic Goals 1. Patient will identify and describe positive and negative feelings related to hospitalization 2. Patient will verbalize benefits of hospitalization to themselves personally 3. Patients will brainstorm together ways they can obtain similar benefits in the outpatient setting, identify barriers to wellness and possible solutions  Summary of Patient Progress:  The patient expressed her primary feelings about being hospitalized are that it has been "awkward, horrendous and irritation."  She talked throughout group on topic, although at times she was difficult to understand and had to be asked to repeat herself.  Therapeutic Modalities Cognitive Behavioral Therapy Motivational Interviewing    Ambrose MantleMareida Grossman-Orr, LCSW 05/12/2017, 1:41 PM

## 2017-05-12 NOTE — Progress Notes (Signed)
Lindsey Richmond  Appears to be responding to internal stimuli. . She either has slept in her bed or has spent time sitting in the dayroom...either eating a little snack or sitting next to a  patient.  A She endorses a flat,disconnected, totally blank affect. She says " what are you looking at when she sees this writer standing at the nurses station". She did complete her daily assessment and on this she wrote she denied SI but she didn't rate her feelings of depression, hopelessness and anxiety. She experienced several episodes where she started yelling randomly " repent...repent...you have to repent" and she was witnessed to lunge at this Clinical research associatewriter. She does not engage in conversation with staff and she has an agitated angry expression. She was given ativan 1 mg po  and geodon 20 mg po prn when she began yelling " repent" and has remained semi -quiet since then. RSafety has remianed in place.

## 2017-05-12 NOTE — Progress Notes (Signed)
Pt was up in the 400 hallway yelling" get out, get out, repent" after getting her blood drawn, pt had to be encouraged back on the unit. Pt walked guided by staff due to pt having eyes closed while she was walking. Pt was given PRN zyprexa and [facility]

## 2017-05-12 NOTE — Progress Notes (Signed)
[facility] Group Notes:  (Nursing/MHT/Case Management/Adjunct)  Date:  05/12/2017  Time:  9:05 PM  Type of Therapy:  Psychoeducational Skills  Participation Level:  Minimal  Participation Quality:  Attentive  Affect:  Flat  Cognitive:  Appropriate  Insight:  Improving  Engagement in Group:  Developing/Improving  Modes of Intervention:  Education  Summary of Progress/Problems: Patient states that she had a good day and that she enjoyed the groups that were offered. She states that she needs to have her overhead light repaired in her bedroom. As for the theme of the day, her coping skill will be to use stress balls and to pray.   Hazle CocaGOODMAN, Suprina Mandeville S 05/12/2017, 9:05 PM

## 2017-05-12 NOTE — Progress Notes (Addendum)
Nursing Progress Note: 7p-7a D: Pt currently presents with a tangential/hyperverbal/hypereligious/staring/labile/delusional/paranoid affect and behavior but is cooperative with groups and meds. Pt states "Repent! Repent! Repent! God is my saviour. He sees your wickedness. Do not cast your eyes upon me." Interacting appropriately with milieu. Pt reports good sleep during the previous night with current medication regimen.   A: Pt provided with medications per providers orders. Pt's labs and vitals were monitored throughout the night. Pt supported emotionally and encouraged to express concerns and questions. Pt educated on medications.  R: Pt's safety ensured with 15 minute and environmental checks. Pt currently denies SI, HI, and endorses AVH by reacting to internal stimuli. Pt verbally contracts to seek staff if SI,HI, or AVH occurs and to consult with staff before acting on any harmful thoughts. Will continue to monitor.

## 2017-05-12 NOTE — Progress Notes (Signed)
[facility]  05/12/2017 2:32 PM Lindsey FileMaiya Ballew  MRN:  [id number]  Subjective:Pt states " I hear Jesus."   Objective: Patient seen and chart reviewed.Discussed patient with treatment team.  Pt seen as guarded , responding to internal stimuli , calling out " Repent repent." Pt with a blunt affect , psychomotor retardation often , but does have periods when she is restless and agitated. Pt required PRN medications to calm her self this AM. Pt continues to need support.    Principal Problem: Schizophrenia, paranoid (HCC)  Diagnosis:   Patient Active Problem List   Diagnosis Date Noted  . Schizophrenia, paranoid (HCC) [F20.0] 04/20/2017    Priority: High   Total Time spent with patient: 25 minutes  Past Psychiatric History: Paranoid Schizophrenia.   Past Medical History: See H&P.  Family History:  Family History  Problem Relation Age of Onset  . Heart failure Mother   . Hypertension Mother   . Diabetes Father    Family Psychiatric  History: Please see H&P.  Social History: Please see H&P.  History  Alcohol Use No     History  Drug Use No    Social History   Social History  . Marital status: Single    Spouse name: N/A  . Number of children: N/A  . Years of education: N/A   Social History Main Topics  . Smoking status: Never Smoker  . Smokeless tobacco: Never Used  . Alcohol use No  . Drug use: No  . Sexual activity: Yes    Birth control/ protection: Pill   Other Topics Concern  . None   Social History Narrative  . None   Additional Social History:   Sleep: Fair  Appetite:  Fair  Current Medications: Current Facility-Administered Medications  Medication Dose Route Frequency Provider Last Rate Last Dose  . acetaminophen (TYLENOL) tablet 650 mg  650 mg Oral Q6H PRN Laveda AbbeParks, Laurie Britton, NP      . alum & mag hydroxide-simeth (MAALOX/MYLANTA) 200-200-20 MG/5ML suspension 30 mL  30 mL Oral Q4H PRN Laveda AbbeParks, Laurie Britton, NP      . ARIPiprazole  (ABILIFY) tablet 10 mg  10 mg Oral QHS Michaila Kenney, MD      . ARIPiprazole (ABILIFY) tablet 5 mg  5 mg Oral Daily Jomarie LongsEappen, Tia Hieronymus, MD   5 mg at 05/12/17 0907  . benztropine (COGENTIN) tablet 0.5 mg  0.5 mg Oral QHS Sha Burling, MD   0.5 mg at 05/11/17 2104  . benztropine (COGENTIN) tablet 0.5 mg  0.5 mg Oral Daily Yitzchok Carriger, MD   0.5 mg at 05/12/17 0907  . divalproex (DEPAKOTE ER) 24 hr tablet 1,000 mg  1,000 mg Oral QHS Tran Randle, MD   1,000 mg at 05/11/17 2103  . hydrOXYzine (ATARAX/VISTARIL) tablet 25 mg  25 mg Oral Q6H PRN Laveda AbbeParks, Laurie Britton, NP   25 mg at 05/10/17 1952  . LORazepam (ATIVAN) tablet 1 mg  1 mg Oral Q6H PRN Jomarie LongsEappen, Alfonzia Woolum, MD   1 mg at 05/12/17 1102   Or  . LORazepam (ATIVAN) injection 1 mg  1 mg Intramuscular Q6H PRN Jomarie LongsEappen, Rhian Asebedo, MD   1 mg at 05/10/17 0302  . magnesium hydroxide (MILK OF MAGNESIA) suspension 30 mL  30 mL Oral Daily PRN Laveda AbbeParks, Laurie Britton, NP      . traZODone (DESYREL) tablet 50 mg  50 mg Oral QHS Laveda AbbeParks, Laurie Britton, NP   50 mg at 05/11/17 2104  . ziprasidone (GEODON) capsule 20 mg  20 mg Oral TID PRN Jomarie Longs, MD       Or  . ziprasidone (GEODON) injection 10 mg  10 mg Intramuscular TID PRN Jomarie Longs, MD       Lab Results:  No results found for this or any previous visit (from the past 48 hour(s)). Blood Alcohol level:  Lab Results  Component Value Date   ETH <5 05/05/2017   ETH <5 04/17/2017   Metabolic Disorder Labs: Lab Results  Component Value Date   HGBA1C 4.9 05/09/2017   MPG 93.93 05/09/2017   Lab Results  Component Value Date   PROLACTIN 102.0 (H) 05/09/2017   Lab Results  Component Value Date   CHOL 150 05/09/2017   TRIG 53 05/09/2017   HDL 33 (L) 05/09/2017   CHOLHDL 4.5 05/09/2017   VLDL 11 05/09/2017   LDLCALC 106 (H) 05/09/2017   Physical Findings: AIMS: Facial and Oral Movements Muscles of Facial Expression: None, normal Lips and Perioral Area: None, normal Jaw: None,  normal Tongue: None, normal,Extremity Movements Upper (arms, wrists, hands, fingers): None, normal Lower (legs, knees, ankles, toes): None, normal, Trunk Movements Neck, shoulders, hips: None, normal, Overall Severity Severity of abnormal movements (highest score from questions above): None, normal Incapacitation due to abnormal movements: None, normal Patient's awareness of abnormal movements (rate only patient's report): No Awareness, Dental Status Current problems with teeth and/or dentures?: No Does patient usually wear dentures?: No  CIWA:  CIWA-Ar Total: 1 COWS:  COWS Total Score: 2  Musculoskeletal: Strength & Muscle Tone: within normal limits Gait & Station: normal Patient leans: N/A  Psychiatric Specialty Exam: Physical Exam  Nursing note and vitals reviewed.   Review of Systems  Psychiatric/Behavioral: Positive for hallucinations. The patient is nervous/anxious.   All other systems reviewed and are negative.   Blood pressure 131/70, pulse (!) 110, temperature 98.6 F (37 C), temperature source Oral, resp. rate 20, height 5\' 4"  (1.626 m), weight 83.5 kg (184 lb).Body mass index is 31.58 kg/m.  General Appearance: Guarded  Eye Contact:  Minimal  Speech:  Blocked and Slow  Volume:  Decreased  Mood:  Anxious and Dysphoric  Affect:  Blunt  Thought Process:  Irrelevant and Descriptions of Associations: Circumstantial  Orientation:  Full (Time, Place, and Person)  Thought Content:  Delusions, Hallucinations: Auditory and Rumination  Suicidal Thoughts:  No  Homicidal Thoughts:  No  Memory:  Immediate;   Fair Recent;   Fair Remote;   Poor  Judgement:  Impaired  Insight:  Shallow  Psychomotor Activity:  Decreased, but does have periods when she is agitated   Concentration:  Concentration: Poor and Attention Span: Poor  Recall:  [employer] of Knowledge:  Fair  Language:  Fair  Akathisia:  No  Handed:  Right  AIMS (if indicated):     Assets:  Social Support  ADL's:   Impaired  Cognition:  Impaired,  Mild  Sleep:  Number of Hours: 5.25   Treatment Plan Summary: Patient with schizophrenia , continues to be psychotic , has mood lability , continues to need medication management.  Will continue today 05/12/17 plan as below except where it is noted.   Daily contact with patient to assess and evaluate symptoms and progress in treatment, Medication management and Plan see below  -Will increase Abilify to 5 mg po daily and 10 mg po qhs for psychosis.Plan is to DC on LAI Abilify,M.  -Continue Depakote ER 1000 mg po qhs for mood symptoms. Depakote level was not  done today - reorder for 05/13/17.  Trazodone 50 mg po qhs for insomnia. - CSW will continue to work on disposition.   Lindsey Friedmann, MD 05/12/2017, 2:32 PMPatient ID: Lindsey Richmond, female   DOB: 1996/07/30, 21 y.o.

## 2017-05-13 LAB — VALPROIC ACID LEVEL: Valproic Acid Lvl: 68 ug/mL (ref 50.0–100.0)

## 2017-05-13 MED ORDER — ARIPIPRAZOLE 15 MG PO TABS
15.0000 mg | ORAL_TABLET | Freq: Every day | ORAL | Status: DC
  Filled 2017-05-13: qty 1

## 2017-05-13 MED ORDER — ARIPIPRAZOLE 10 MG PO TABS
10.0000 mg | ORAL_TABLET | ORAL | Status: DC
  Administered 2017-05-13 – 2017-05-17 (×8): 10 mg via ORAL
  Filled 2017-05-13 (×2): qty 14
  Filled 2017-05-13 (×10): qty 1

## 2017-05-13 NOTE — Progress Notes (Signed)
[facility] Group Notes:  (Nursing/MHT/Case Management/Adjunct)  Date:  05/13/2017  Time:  9:00 PM  Type of Therapy:  Psychoeducational Skills  Participation Level:  Active  Participation Quality:  Appropriate  Affect:  Flat  Cognitive:  Appropriate  Insight:  Improving  Engagement in Group:  Developing/Improving  Modes of Intervention:  Education  Summary of Progress/Problems: The patient shared in group that she had a "decent day" since she worked on being "more proactive" today. She responded more in group this evening as compared to last night. As for the theme of the day, her support system will be comprised of her family and friends.   Keven Osborn S 05/13/2017, 9:00 PM

## 2017-05-13 NOTE — Progress Notes (Signed)
Lindsey Richmond is doing a little better today.  Her behavior shows she is less guarded and paranoid . She has spent much of the day sitting in her room- resting in her bed . She has a blank, blunted affect . She endorses psychomotor retardation and she is quite suspicious and paranoid of people , in general. A She completed her daily assessment and on this she wrote she deneis SI today and she rated her depression, hopelessness and anxiety " 0/0/0/", respectively. She has experienced 2 episodes when she yelled  " repent repent repent". R safety is in place and poc cont.

## 2017-05-13 NOTE — Progress Notes (Addendum)
Pt came up to nurses station in state of some agitation, agreed to take medications after some prompting.   Pt given medication for agitation as well due to increasing agitation reported and becoming loud, was upset over peer next door to her room talking.  Pt cooperative with this, and returned to room.  Will continue to monitor for safety.

## 2017-05-13 NOTE — Progress Notes (Signed)
D.  Pt pleasant but guarded on approach, denies complaints at this time.  Both Pt and father asked about discharge plans and would like to know as soon as possible about disposition.  Pt went to bed and would not arouse for medications, will give as soon as awakens.  Respirations even and unlabored.  Pt did attend evening wrap up group.  Pt denies SI/HI at this time.  A.  Support and encouragement offered, explained about Treatment team meeting tomorrow.  R.  Pt remains safe on the unit, will continue to monitor.

## 2017-05-13 NOTE — Progress Notes (Signed)
[facility] MD Progress Note  05/13/2017 1:36 PM Lindsey Richmond  MRN:  [id number]  Subjective:Pt states " I hear Jesus , I ask you to repent , get away from me devil."    Objective: Patient seen and chart reviewed.Discussed patient with treatment team.  Pt today seen as slow , seems to be having thought blocking and blunt affect often , but also has periods when she shouts phrases , repeat them and is loud , agitated and needs PRN medications. Per RN, pt had refused all her medications this AM. However , when writer met with her and discussed the need for being on medications , she agreed to take them. Pt continues to appear paranoid often, seen as staring , continues to need support.       Principal Problem: Schizophrenia, paranoid ([place])  Diagnosis:   Patient Active Problem List   Diagnosis Date Noted  . Schizophrenia, paranoid ([place]) [F20.0] 04/20/2017    Priority: High   Total Time spent with patient: 25 minutes  Past Psychiatric History: Paranoid Schizophrenia.   Past Medical History: See H&P.  Family History:  Family History  Problem Relation Age of Onset  . Heart failure Mother   . Hypertension Mother   . Diabetes Father    Family Psychiatric  History: Please see H&P.  Social History: Please see H&P.  History  Alcohol Use No     History  Drug Use No    Social History   Social History  . Marital status: Single    Spouse name: N/A  . Number of children: N/A  . Years of education: N/A   Social History Main Topics  . Smoking status: Never Smoker  . Smokeless tobacco: Never Used  . Alcohol use No  . Drug use: No  . Sexual activity: Yes    Birth control/ protection: Pill   Other Topics Concern  . None   Social History Narrative  . None   Additional Social History:   Sleep: Fair  Appetite:  Fair  Current Medications: Current Facility-Administered Medications  Medication Dose Route Frequency Provider Last Rate Last Dose  . acetaminophen (TYLENOL)  tablet 650 mg  650 mg Oral Q6H PRN Ethelene Hal, NP      . alum & mag hydroxide-simeth (MAALOX/MYLANTA) 200-200-20 MG/5ML suspension 30 mL  30 mL Oral Q4H PRN Ethelene Hal, NP      . ARIPiprazole (ABILIFY) tablet 10 mg  10 mg Oral BH-qamhs , , MD      . benztropine (COGENTIN) tablet 0.5 mg  0.5 mg Oral QHS , Ria Clock, MD   0.5 mg at 05/12/17 1957  . benztropine (COGENTIN) tablet 0.5 mg  0.5 mg Oral Daily , , MD   0.5 mg at 05/13/17 0924  . divalproex (DEPAKOTE ER) 24 hr tablet 1,000 mg  1,000 mg Oral QHS , , MD   1,000 mg at 05/12/17 1955  . hydrOXYzine (ATARAX/VISTARIL) tablet 25 mg  25 mg Oral Q6H PRN Ethelene Hal, NP   25 mg at 05/10/17 1952  . LORazepam (ATIVAN) tablet 1 mg  1 mg Oral Q6H PRN Ursula Alert, MD   1 mg at 05/12/17 1956   Or  . LORazepam (ATIVAN) injection 1 mg  1 mg Intramuscular Q6H PRN Ursula Alert, MD   1 mg at 05/10/17 0302  . magnesium hydroxide (MILK OF MAGNESIA) suspension 30 mL  30 mL Oral Daily PRN Ethelene Hal, NP      . traZODone (DESYREL) tablet  50 mg  50 mg Oral QHS Ethelene Hal, NP   50 mg at 05/12/17 1956  . ziprasidone (GEODON) capsule 20 mg  20 mg Oral TID PRN Ursula Alert, MD   20 mg at 05/12/17 1956   Or  . ziprasidone (GEODON) injection 10 mg  10 mg Intramuscular TID PRN Ursula Alert, MD       Lab Results:  No results found for this or any previous visit (from the past 48 hour(s)). Blood Alcohol level:  Lab Results  Component Value Date   ETH <5 05/05/2017   ETH <5 40/98/1191   Metabolic Disorder Labs: Lab Results  Component Value Date   HGBA1C 4.9 05/09/2017   MPG 93.93 05/09/2017   Lab Results  Component Value Date   PROLACTIN 102.0 (H) 05/09/2017   Lab Results  Component Value Date   CHOL 150 05/09/2017   TRIG 53 05/09/2017   HDL 33 (L) 05/09/2017   CHOLHDL 4.5 05/09/2017   VLDL 11 05/09/2017   LDLCALC 106 (H) 05/09/2017   Physical  Findings: AIMS: Facial and Oral Movements Muscles of Facial Expression: None, normal Lips and Perioral Area: None, normal Jaw: None, normal Tongue: None, normal,Extremity Movements Upper (arms, wrists, hands, fingers): None, normal Lower (legs, knees, ankles, toes): None, normal, Trunk Movements Neck, shoulders, hips: None, normal, Overall Severity Severity of abnormal movements (highest score from questions above): None, normal Incapacitation due to abnormal movements: None, normal Patient's awareness of abnormal movements (rate only patient's report): No Awareness, Dental Status Current problems with teeth and/or dentures?: No Does patient usually wear dentures?: No  CIWA:  CIWA-Ar Total: 1 COWS:  COWS Total Score: 2  Musculoskeletal: Strength & Muscle Tone: within normal limits Gait & Station: normal Patient leans: N/A  Psychiatric Specialty Exam: Physical Exam  Nursing note and vitals reviewed.   Review of Systems  Psychiatric/Behavioral: Positive for hallucinations. The patient is nervous/anxious.   All other systems reviewed and are negative.   Blood pressure 98/65, pulse 91, temperature 98.9 F (37.2 C), temperature source Oral, resp. rate 18, height 5' 4" (1.626 m), weight 83.5 kg (184 lb).Body mass index is 31.58 kg/m.  General Appearance: Guarded  Eye Contact:  staring  Speech:  Blocked, Slow and but there are periods when she repeats certain phrases and is loud  Volume:  varies  Mood:  Anxious and Dysphoric  Affect:  Blunt  Thought Process:  Irrelevant and Descriptions of Associations: Circumstantial  Orientation:  Full (Time, Place, and Person)  Thought Content:  Delusions, Hallucinations: Auditory and Rumination  Suicidal Thoughts:  No  Homicidal Thoughts:  No  Memory:  Immediate;   Fair Recent;   Fair Remote;   Poor  Judgement:  Impaired  Insight:  Shallow  Psychomotor Activity:  Decreased, has periods of agitation  Concentration:  Concentration: Poor  and Attention Span: Poor  Recall:  [employer] of Knowledge:  Fair  Language:  Fair  Akathisia:  No  Handed:  Right  AIMS (if indicated):     Assets:  Social Support  ADL's:  Impaired  Cognition:  Impaired,  Mild  Sleep:  Number of Hours: 4.5   Treatment Plan Summary: Patient with schizophrenia , continues to have mood lability , thought blocking and psychosis , slow progress . Continue treatment.  Will continue today 05/13/17  plan as below except where it is noted.   Daily contact with patient to assess and evaluate symptoms and progress in treatment, Medication management and Plan see  below  -Will increase Abilify to 10 bid for psychosis. Plan to discharge on Abilify Maintena IM .  -Continue Depakote ER 1000 mg po qhs for mood symptoms. Depakote level  reordered for 05/13/17- refused .  Trazodone 50 mg po qhs for insomnia. - CSW will continue to work on disposition.   Nastacia Raybuck, MD 05/13/2017, 1:36 PMPatient ID: Lindsey Richmond, female   DOB: 25-Jan-1996, 21 y.o.

## 2017-05-13 NOTE — Tx Team (Deleted)
Initial Treatment Plan 05/13/2017 1:41 PM Lindsey Richmond ZOX:[id number]    PATIENT STRESSORS: Educational concerns Financial difficulties Health problems   PATIENT STRENGTHS: Ability for insight Active sense of humor Average or above average intelligence Capable of independent living   PATIENT IDENTIFIED PROBLEMS: Suicidal Ideation with MDD            " I graduate from school next year"  " I love my family"       DISCHARGE CRITERIA:  Ability to meet basic life and health needs Adequate post-discharge living arrangements Improved stabilization in mood, thinking, and/or behavior Medical problems require only outpatient monitoring  PRELIMINARY DISCHARGE PLAN: Attend aftercare/continuing care group Attend PHP/IOP  PATIENT/FAMILY INVOLVEMENT: This treatment plan has been presented to and reviewed with the patient, Lindsey Richmond, and/or family member, .  The patient and family have been given the opportunity to ask questions and make suggestions.  Rich Braveuke, Dakwan Pridgen Lynn, RN 05/13/2017, 1:41 PM

## 2017-05-13 NOTE — [facility] Group Notes (Signed)
[facility]  Date/Time:  05/13/2017  11:00AM-12:00PM  Type of Therapy and Topic:  Group Therapy:  Music and Mood  Participation Level:  Active   Description of Group: In this process group, members listened to a variety of genres of music and identified that different types of music evoke different responses.  Patients were encouraged to identify music that was soothing for them and music that was energizing for them.  Patients discussed how this knowledge can help with wellness and recovery in various ways including managing depression and anxiety as well as encouraging healthy sleep habits.    Therapeutic Goals: 1. Patients will explore the impact of different varieties of music on mood 2. Patients will verbalize the thoughts they have when listening to different types of music 3. Patients will identify music that is soothing to them as well as music that is energizing to them 4. Patients will discuss how to use this knowledge to assist in maintaining wellness and recovery 5. Patients will explore the use of music as a coping skill  Summary of Patient Progress:  At the beginning of group, patient expressed she was in an "amazing" mood and at the end of group stated the same.  She listened attentively, made appropriate remarks, and was interactive throughout.  Therapeutic Modalities: Solution Focused Brief Therapy Motivational Interviewing Activity   Ambrose MantleMareida Grossman-Orr, LCSW 05/13/2017 1:15 PM

## 2017-05-14 NOTE — [facility] Group Notes (Signed)
[facility] LCSW Group Therapy  05/14/2017 1:15 pm  Type of Therapy: Process Group Therapy  Participation Level:  Active  Participation Quality:  Appropriate  Affect:  Flat  Cognitive:  Oriented  Insight:  Improving  Engagement in Group:  Limited  Engagement in Therapy:  Limited  Modes of Intervention:  Activity, Clarification, Education, Problem-solving and Support  Summary of Progress/Problems: Today's group addressed the issue of overcoming obstacles.  Patients were asked to identify their biggest obstacle post d/c that stands in the way of their on-going success, and then problem solve as to how to manage this. Came late, but stayed the rest of the time.  Engaged throughout. Minimal interaction.  Flat affect. Still somewhat delayed in responses to questions. Stated that she sometimes does meditation, and also enjoys going to the local recreation center.  Lindsey Richmond, Lindsey Richmond 05/14/2017   2:48 PM

## 2017-05-14 NOTE — Tx Team (Signed)
Interdisciplinary Treatment and Diagnostic Plan Update  05/14/2017 Time of Session: 8:43 AM  Lindsey Richmond MRN: [id number]  Principal Diagnosis: Schizophrenia, paranoid ([place])  Secondary Diagnoses: Principal Problem:   Schizophrenia, paranoid ([place])   Current Medications:  Current Facility-Administered Medications  Medication Dose Route Frequency Provider Last Rate Last Dose  . acetaminophen (TYLENOL) tablet 650 mg  650 mg Oral Q6H PRN Ethelene Hal, NP      . alum & mag hydroxide-simeth (MAALOX/MYLANTA) 200-200-20 MG/5ML suspension 30 mL  30 mL Oral Q4H PRN Ethelene Hal, NP      . ARIPiprazole (ABILIFY) tablet 10 mg  10 mg Oral Brynda Greathouse, MD   10 mg at 05/14/17 0755  . benztropine (COGENTIN) tablet 0.5 mg  0.5 mg Oral QHS Eappen, Saramma, MD   0.5 mg at 05/13/17 2232  . benztropine (COGENTIN) tablet 0.5 mg  0.5 mg Oral Daily Eappen, Saramma, MD   0.5 mg at 05/14/17 0755  . divalproex (DEPAKOTE ER) 24 hr tablet 1,000 mg  1,000 mg Oral QHS Eappen, Saramma, MD   1,000 mg at 05/13/17 2232  . hydrOXYzine (ATARAX/VISTARIL) tablet 25 mg  25 mg Oral Q6H PRN Ethelene Hal, NP   25 mg at 05/10/17 1952  . LORazepam (ATIVAN) tablet 1 mg  1 mg Oral Q6H PRN Ursula Alert, MD   1 mg at 05/13/17 2232   Or  . LORazepam (ATIVAN) injection 1 mg  1 mg Intramuscular Q6H PRN Ursula Alert, MD   1 mg at 05/10/17 0302  . magnesium hydroxide (MILK OF MAGNESIA) suspension 30 mL  30 mL Oral Daily PRN Ethelene Hal, NP      . traZODone (DESYREL) tablet 50 mg  50 mg Oral QHS Ethelene Hal, NP   50 mg at 05/13/17 2232  . ziprasidone (GEODON) capsule 20 mg  20 mg Oral TID PRN Ursula Alert, MD   20 mg at 05/13/17 2232   Or  . ziprasidone (GEODON) injection 10 mg  10 mg Intramuscular TID PRN Ursula Alert, MD        PTA Medications: Prescriptions Prior to Admission  Medication Sig Dispense Refill Last Dose  . benztropine (COGENTIN) 0.5 MG tablet Take 1  tablet (0.5 mg total) by mouth daily as needed for tremors. 30 tablet 0   . divalproex (DEPAKOTE ER) 500 MG 24 hr tablet Take 2 tablets (1,000 mg total) by mouth at bedtime. For mood stabilization 60 tablet 0   . LORazepam (ATIVAN) 2 MG tablet Take 1 tablet (2 mg total) by mouth every 6 (six) hours as needed for anxiety (agitation. Please hold for oversedation.). 12 tablet 0   . risperiDONE (RISPERDAL M-TABS) 1 MG disintegrating tablet Take 1 tablet (1 mg total) by mouth 2 (two) times daily as needed (agitation). 60 tablet 0   . risperiDONE (RISPERDAL M-TABS) 3 MG disintegrating tablet Take 1 tablet (3 mg total) by mouth at bedtime. For mood control 30 tablet 0     Patient Stressors: Financial difficulties Medication change or noncompliance  Patient Strengths: General fund of knowledge Physical Health Supportive family/friends  Treatment Modalities: Medication Management, Group therapy, Case management,  1 to 1 session with clinician, Psychoeducation, Recreational therapy.   Physician Treatment Plan for Primary Diagnosis: Schizophrenia, paranoid ([place]) Long Term Goal(s): Improvement in symptoms so as ready for discharge  Short Term Goals: Ability to identify changes in lifestyle to reduce recurrence of condition will improve Ability to verbalize feelings will improve Compliance with prescribed medications  will improve Ability to identify changes in lifestyle to reduce recurrence of condition will improve Ability to verbalize feelings will improve Compliance with prescribed medications will improve  Medication Management: Evaluate patient's response, side effects, and tolerance of medication regimen.  Therapeutic Interventions: 1 to 1 sessions, Unit Group sessions and Medication administration.  Evaluation of Outcomes: Progressing   8/13: Patient with schizophrenia , continues to have mood lability , thought blocking and psychosis , slow progress . Continue treatment.  -Will  increase Abilify to 10 bid for psychosis. Plan to discharge on Abilify Maintena IM .  -Continue Depakote ER 1000 mg po qhs for mood symptoms. Depakote level  reordered for 05/13/17- refused .  Trazodone 50 mg po qhs for insomnia.  Physician Treatment Plan for Secondary Diagnosis: Principal Problem:   Schizophrenia, paranoid ([place])   Long Term Goal(s): Improvement in symptoms so as ready for discharge  Short Term Goals: Ability to identify changes in lifestyle to reduce recurrence of condition will improve Ability to verbalize feelings will improve Compliance with prescribed medications will improve Ability to identify changes in lifestyle to reduce recurrence of condition will improve Ability to verbalize feelings will improve Compliance with prescribed medications will improve  Medication Management: Evaluate patient's response, side effects, and tolerance of medication regimen.  Therapeutic Interventions: 1 to 1 sessions, Unit Group sessions and Medication administration.  Evaluation of Outcomes: Progressing   RN Treatment Plan for Primary Diagnosis: Schizophrenia, paranoid ([place]) Long Term Goal(s): Knowledge of disease and therapeutic regimen to maintain health will improve  Short Term Goals: Ability to identify and develop effective coping behaviors will improve and Compliance with prescribed medications will improve  Medication Management: RN will administer medications as ordered by provider, will assess and evaluate patient's response and provide education to patient for prescribed medication. RN will report any adverse and/or side effects to prescribing provider.  Therapeutic Interventions: 1 on 1 counseling sessions, Psychoeducation, Medication administration, Evaluate responses to treatment, Monitor vital signs and CBGs as ordered, Perform/monitor CIWA, COWS, AIMS and Fall Risk screenings as ordered, Perform wound care treatments as ordered.  Evaluation of Outcomes:  Progressing   LCSW Treatment Plan for Primary Diagnosis: Schizophrenia, paranoid ([place]) Long Term Goal(s): Safe transition to appropriate next level of care at discharge, Engage patient in therapeutic group addressing interpersonal concerns.  Short Term Goals: Engage patient in aftercare planning with referrals and resources  Therapeutic Interventions: Assess for all discharge needs, 1 to 1 time with Social worker, Explore available resources and support systems, Assess for adequacy in community support network, Educate family and significant other(s) on suicide prevention, Complete Psychosocial Assessment, Interpersonal group therapy.  Evaluation of Outcomes: Met  Return home, follow up [facility]   Progress in Treatment: Attending groups: Yes Participating in groups: Yes Taking medication as prescribed: Yes Toleration medication: Yes, no side effects reported at this time Family/Significant other contact made: Yes Patient understands diagnosis: Yes AEB asking for help with symptoms Discussing patient identified problems/goals with staff: Yes Medical problems stabilized or resolved: Yes Denies suicidal/homicidal ideation: Yes Issues/concerns per patient self-inventory: None Other: N/A  New problem(s) identified: None identified at this time.   New Short Term/Long Term Goal(s): "I have a lot of anxiety all the time, and I feel a little confused."  Decrease anxiety and confusion   Discharge Plan or Barriers:   Reason for Continuation of Hospitalization: Disorganization Paranoia Medication stabilization   Estimated Length of Stay: 8/17  Attendees: Patient:  05/14/2017  8:43 AM  Physician: Ria Clock  Shea Evans, MD 05/14/2017  8:43 AM  Nursing: Grayland Ormond RN 05/14/2017  8:43 AM  RN Care Manager: Lars Pinks, RN 05/14/2017  8:43 AM  Social Worker: Ripley Fraise 05/14/2017  8:43 AM  Recreational Therapist: Winfield Cunas 05/14/2017  8:43 AM  Other: Norberto Sorenson 05/14/2017   8:43 AM  Other:  05/14/2017  8:43 AM    Scribe for Treatment Team:  Roque Lias LCSW 05/14/2017 8:43 AM

## 2017-05-14 NOTE — Progress Notes (Signed)
Pt up this morning for breakfast, stood at doorway appearing to respond to internal stimuli.  Pt appeared to be looking at someone and speaking to them.  She also appeared to be responding.  When asked if she could be assisted, Pt responded she was waiting on her laundry.  MHT checked on laundry, then Pt dropped to her knees.  When asked if she was okay, Pt stated "I'm praying to Jesus".  Will continue to monitor, Pt remains safe.

## 2017-05-14 NOTE — Progress Notes (Signed)
[facility] MD Progress Note  05/14/2017 1:24 PM Lindsey Richmond  MRN:  [id number]  Subjective: Lindsey Richmond reports, "I'm doing alright. My mood is good as always. Jesus is good all the time".  Objective: Patient seen and chart reviewed. Discussed patient with the treatment team. Patient today presents disheveled with blunted affect. She remains slow in response to any verbal cues. She presents with thought blocking, She has periods when she shouts phrases, repeat them loudly. Can be agitated and will need PRN medications to help calm her down. However, she continues to respond to some internal stimuli. She remains paranoid. She is seen most times starring at something while making faces at what ever she is starring at. Lindsey Richmond continues to need support.  Principal Problem: Schizophrenia, paranoid ([facility])  Diagnosis:   Patient Active Problem List   Diagnosis Date Noted  . Schizophrenia, paranoid ([facility]) [F20.0] 04/20/2017    Priority: High   Total Time spent with patient: 15 minutes  Past Psychiatric History: Paranoid Schizophrenia.   Past Medical History: See H&P.  Family History:  Family History  Problem Relation Age of Onset  . Heart failure Mother   . Hypertension Mother   . Diabetes Father    Family Psychiatric  History: Please see H&P.  Social History: Please see H&P.  History  Alcohol Use No     History  Drug Use No    Social History   Social History  . Marital status: Single    Spouse name: N/A  . Number of children: N/A  . Years of education: N/A   Social History Main Topics  . Smoking status: Never Smoker  . Smokeless tobacco: Never Used  . Alcohol use No  . Drug use: No  . Sexual activity: Yes    Birth control/ protection: Pill   Other Topics Concern  . None   Social History Narrative  . None   Additional Social History:   Sleep: Fair  Appetite:  Fair  Current Medications: Current Facility-Administered Medications  Medication Dose Route Frequency Provider Last  Rate Last Dose  . acetaminophen (TYLENOL) tablet 650 mg  650 mg Oral Q6H PRN Laveda Abbe, NP      . alum & mag hydroxide-simeth (MAALOX/MYLANTA) 200-200-20 MG/5ML suspension 30 mL  30 mL Oral Q4H PRN Laveda Abbe, NP      . ARIPiprazole (ABILIFY) tablet 10 mg  10 mg Oral Deidre Ala, MD   10 mg at 05/14/17 0755  . benztropine (COGENTIN) tablet 0.5 mg  0.5 mg Oral QHS Eappen, Saramma, MD   0.5 mg at 05/13/17 2232  . benztropine (COGENTIN) tablet 0.5 mg  0.5 mg Oral Daily Eappen, Saramma, MD   0.5 mg at 05/14/17 0755  . divalproex (DEPAKOTE ER) 24 hr tablet 1,000 mg  1,000 mg Oral QHS Eappen, Saramma, MD   1,000 mg at 05/13/17 2232  . hydrOXYzine (ATARAX/VISTARIL) tablet 25 mg  25 mg Oral Q6H PRN Laveda Abbe, NP   25 mg at 05/10/17 1952  . LORazepam (ATIVAN) tablet 1 mg  1 mg Oral Q6H PRN Jomarie Longs, MD   1 mg at 05/13/17 2232   Or  . LORazepam (ATIVAN) injection 1 mg  1 mg Intramuscular Q6H PRN Jomarie Longs, MD   1 mg at 05/10/17 0302  . magnesium hydroxide (MILK OF MAGNESIA) suspension 30 mL  30 mL Oral Daily PRN Laveda Abbe, NP      . traZODone (DESYREL) tablet 50 mg  50 mg Oral  QHS Laveda Abbe, NP   50 mg at 05/13/17 2232  . ziprasidone (GEODON) capsule 20 mg  20 mg Oral TID PRN Jomarie Longs, MD   20 mg at 05/13/17 2232   Or  . ziprasidone (GEODON) injection 10 mg  10 mg Intramuscular TID PRN Jomarie Longs, MD       Lab Results:  Results for orders placed or performed during the hospital encounter of 05/07/17 (from the past 48 hour(s))  Valproic acid level     Status: None   Collection Time: 05/13/17  5:50 PM  Result Value Ref Range   Valproic Acid Lvl 68 50.0 - 100.0 ug/mL    Comment: Performed at [facility], [address] [place], [place] [address]   Blood Alcohol level:  Lab Results  Component Value Date   [facility] <5 05/05/2017   ETH <5 04/17/2017   Metabolic Disorder Labs: Lab Results   Component Value Date   HGBA1C 4.9 05/09/2017   MPG 93.93 05/09/2017   Lab Results  Component Value Date   PROLACTIN 102.0 (H) 05/09/2017   Lab Results  Component Value Date   CHOL 150 05/09/2017   TRIG 53 05/09/2017   HDL 33 (L) 05/09/2017   CHOLHDL 4.5 05/09/2017   VLDL 11 05/09/2017   LDLCALC 106 (H) 05/09/2017   Physical Findings: AIMS: Facial and Oral Movements Muscles of Facial Expression: None, normal Lips and Perioral Area: None, normal Jaw: None, normal Tongue: None, normal,Extremity Movements Upper (arms, wrists, hands, fingers): None, normal Lower (legs, knees, ankles, toes): None, normal, Trunk Movements Neck, shoulders, hips: None, normal, Overall Severity Severity of abnormal movements (highest score from questions above): None, normal Incapacitation due to abnormal movements: None, normal Patient's awareness of abnormal movements (rate only patient's report): No Awareness, Dental Status Current problems with teeth and/or dentures?: No Does patient usually wear dentures?: No  CIWA:  CIWA-Ar Total: 1 COWS:  COWS Total Score: 2  Musculoskeletal: Strength & Muscle Tone: within normal limits Gait & Station: normal Patient leans: N/A  Psychiatric Specialty Exam: Physical Exam  Nursing note and vitals reviewed.   Review of Systems  Psychiatric/Behavioral: Positive for hallucinations ( Hx. Psychosis, delusiona, hyper-religiousity) and substance abuse (UDS (+) for Benzodiazepine). Negative for depression, memory loss and suicidal ideas. The patient is nervous/anxious and has insomnia.   All other systems reviewed and are negative.   Blood pressure 98/65, pulse 91, temperature 98.9 F (37.2 C), temperature source Oral, resp. rate 18, height 5\' 4"  (1.626 m), weight 83.5 kg (184 lb).Body mass index is 31.58 kg/m.  General Appearance: Guarded  Eye Contact:  staring  Speech:  Blocked, Slow and but there are periods when she repeats certain phrases and is loud   Volume:  varies  Mood:  Anxious and Dysphoric  Affect:  Blunt  Thought Process:  Irrelevant and Descriptions of Associations: Circumstantial  Orientation:  Full (Time, Place, and Person)  Thought Content:  Delusions, Hallucinations: Auditory and Rumination  Suicidal Thoughts:  No  Homicidal Thoughts:  No  Memory:  Immediate;   Fair Recent;   Fair Remote;   Poor  Judgement:  Impaired  Insight:  Shallow  Psychomotor Activity:  Decreased, has periods of agitation  Concentration:  Concentration: Poor and Attention Span: Poor  Recall:  [employer] of Knowledge:  Fair  Language:  Fair  Akathisia:  No  Handed:  Right  AIMS (if indicated):     Assets:  Social Support  ADL's:  Impaired  Cognition:  Impaired,  Mild  Sleep:  Number of Hours: 6   Treatment Plan Summary: Patient with schizophrenia , continues to have mood lability, thought blocking and psychosis, slow progress. Continue treatment.  Will continue today 05/14/17  plan as below except where it is noted.   Daily contact with patient to assess and evaluate symptoms and progress in treatment, Medication management and Plan see below  -Continue Abilify 10 bid for psychosis. Plan to discharge on Abilify Maintena IM .  -Continue Depakote ER 1000 mg po qhs for mood symptoms. Depakote level  reordered for 05/13/17- 68, within therapeutic range .  -Continue Trazodone 50 mg po qhs for insomnia.  -CSW will continue to work on disposition.   Sanjuana KavaNwoko, Agnes I, NP, PMHNP, FNP-BC 05/14/2017, 1:24 PMPatient ID: Lindsey FileMaiya Richmond, female   DOB: 1995-12-20, 21 y.o.     Agree with NP Progress Note

## 2017-05-14 NOTE — [facility] Group Notes (Signed)
Nursing Group Note 1030  The topic for group today was Wellness.  We asked the patients to introduce themselves and share the goals they put on their self-inventory sheets with the group.  We then went through the Monday "Wellness" packet and encouraged the patients to complete their packets in their own time.  Patient attended entire group, participated, and was receptive.  Answered questions appropriately.

## 2017-05-14 NOTE — Progress Notes (Signed)
Adult Psychoeducational Group Note  Date:  05/14/2017 Time:  8:32 PM  Group Topic/Focus:  Wrap-Up Group:   The focus of this group is to help patients review their daily goal of treatment and discuss progress on daily workbooks.  Participation Level:  None  Participation Quality:  Drowsy  Affect:  Flat  Cognitive:  Confused  Insight: Limited  Engagement in Group:  Poor  Modes of Intervention:    Additional Comments:  The patient expressed that she attended groups.  Octavio Mannshigpen, Osker Ayoub Lee 05/14/2017, 8:32 PM

## 2017-05-15 MED ORDER — TRAZODONE HCL 150 MG PO TABS
75.0000 mg | ORAL_TABLET | Freq: Every day | ORAL | Status: DC
  Administered 2017-05-15: 75 mg via ORAL
  Filled 2017-05-15 (×2): qty 0.5
  Filled 2017-05-15: qty 1

## 2017-05-15 MED ORDER — BUPROPION HCL 75 MG PO TABS
75.0000 mg | ORAL_TABLET | Freq: Every morning | ORAL | Status: DC
  Administered 2017-05-15 – 2017-05-17 (×3): 75 mg via ORAL
  Filled 2017-05-15 (×3): qty 1
  Filled 2017-05-15: qty 7
  Filled 2017-05-15 (×2): qty 1

## 2017-05-15 NOTE — Progress Notes (Signed)
Father would like for staff to call him before discharge r/t he is out of town [phone] [facility]

## 2017-05-15 NOTE — Progress Notes (Addendum)
[facility] MD Progress Note  05/15/2017 2:29 PM Lindsey Richmond  MRN:  [id number]  Subjective: Patient states " I am fine."   Objective:Patient seen and chart reviewed.Discussed patient with treatment team.  Pt seen with psychomotor retardation, blunted affect , does have some thought blocking. However , she is calm , less restless and is not seen as actively hallucinating . Per RN, pt continues to need a lot of support and encouragement , has been compliant with medications. Continue treatment.      Principal Problem: Schizophrenia, paranoid (HCC)  Diagnosis:   Patient Active Problem List   Diagnosis Date Noted  . Schizophrenia, paranoid (HCC) [F20.0] 04/20/2017    Priority: High   Total Time spent with patient: 25 minutes  Past Psychiatric History: Paranoid Schizophrenia.   Past Medical History: See H&P.  Family History:  Family History  Problem Relation Age of Onset  . Heart failure Mother   . Hypertension Mother   . Diabetes Father    Family Psychiatric  History: Please see H&P.  Social History: Please see H&P.  History  Alcohol Use No     History  Drug Use No    Social History   Social History  . Marital status: Single    Spouse name: N/A  . Number of children: N/A  . Years of education: N/A   Social History Main Topics  . Smoking status: Never Smoker  . Smokeless tobacco: Never Used  . Alcohol use No  . Drug use: No  . Sexual activity: Yes    Birth control/ protection: Pill   Other Topics Concern  . None   Social History Narrative  . None   Additional Social History:   Sleep: Fair  Appetite:  Fair  Current Medications: Current Facility-Administered Medications  Medication Dose Route Frequency Provider Last Rate Last Dose  . acetaminophen (TYLENOL) tablet 650 mg  650 mg Oral Q6H PRN Laveda Abbe, NP      . alum & mag hydroxide-simeth (MAALOX/MYLANTA) 200-200-20 MG/5ML suspension 30 mL  30 mL Oral Q4H PRN Laveda Abbe, NP       . ARIPiprazole (ABILIFY) tablet 10 mg  10 mg Oral Deidre Ala, MD   10 mg at 05/15/17 0823  . benztropine (COGENTIN) tablet 0.5 mg  0.5 mg Oral QHS Octavie Westerhold, MD   0.5 mg at 05/14/17 2131  . benztropine (COGENTIN) tablet 0.5 mg  0.5 mg Oral Daily Zade Falkner, MD   0.5 mg at 05/15/17 0823  . divalproex (DEPAKOTE ER) 24 hr tablet 1,000 mg  1,000 mg Oral QHS Juel Ripley, MD   1,000 mg at 05/14/17 2130  . hydrOXYzine (ATARAX/VISTARIL) tablet 25 mg  25 mg Oral Q6H PRN Laveda Abbe, NP   25 mg at 05/10/17 1952  . LORazepam (ATIVAN) tablet 1 mg  1 mg Oral Q6H PRN Jomarie Longs, MD   1 mg at 05/13/17 2232   Or  . LORazepam (ATIVAN) injection 1 mg  1 mg Intramuscular Q6H PRN Jomarie Longs, MD   1 mg at 05/10/17 0302  . magnesium hydroxide (MILK OF MAGNESIA) suspension 30 mL  30 mL Oral Daily PRN Laveda Abbe, NP      . traZODone (DESYREL) tablet 50 mg  50 mg Oral QHS Laveda Abbe, NP   50 mg at 05/14/17 2130  . ziprasidone (GEODON) capsule 20 mg  20 mg Oral TID PRN Jomarie Longs, MD   20 mg at 05/13/17 2232   Or  .  ziprasidone (GEODON) injection 10 mg  10 mg Intramuscular TID PRN Jomarie LongsEappen, Colston Pyle, MD       Lab Results:  Results for orders placed or performed during the hospital encounter of 05/07/17 (from the past 48 hour(s))  Valproic acid level     Status: None   Collection Time: 05/13/17  5:50 PM  Result Value Ref Range   Valproic Acid Lvl 68 50.0 - 100.0 ug/mL    Comment: Performed at [facility], [address] [place], [place] [address]   Blood Alcohol level:  Lab Results  Component Value Date   [facility] 05/05/2017   ETH <5 04/17/2017   Metabolic Disorder Labs: Lab Results  Component Value Date   HGBA1C 4.9 05/09/2017   MPG 93.93 05/09/2017   Lab Results  Component Value Date   PROLACTIN 102.0 (H) 05/09/2017   Lab Results  Component Value Date   CHOL 150 05/09/2017   TRIG 53 05/09/2017   HDL 33 (L)  05/09/2017   CHOLHDL 4.5 05/09/2017   VLDL 11 05/09/2017   LDLCALC 106 (H) 05/09/2017   Physical Findings: AIMS: Facial and Oral Movements Muscles of Facial Expression: None, normal Lips and Perioral Area: None, normal Jaw: None, normal Tongue: None, normal,Extremity Movements Upper (arms, wrists, hands, fingers): None, normal Lower (legs, knees, ankles, toes): None, normal, Trunk Movements Neck, shoulders, hips: None, normal, Overall Severity Severity of abnormal movements (highest score from questions above): None, normal Incapacitation due to abnormal movements: None, normal Patient's awareness of abnormal movements (rate only patient's report): No Awareness, Dental Status Current problems with teeth and/or dentures?: No Does patient usually wear dentures?: No  CIWA:  CIWA-Ar Total: 1 COWS:  COWS Total Score: 2  Musculoskeletal: Strength & Muscle Tone: within normal limits Gait & Station: normal Patient leans: N/A  Psychiatric Specialty Exam: Physical Exam  Nursing note and vitals reviewed.   Review of Systems  Psychiatric/Behavioral: Positive for hallucinations.  All other systems reviewed and are negative.   Blood pressure 98/65, pulse 91, temperature 98.9 F (37.2 C), temperature source Oral, resp. rate 18, height 5\' 4"  (1.626 m), weight 83.5 kg (184 lb).Body mass index is 31.58 kg/m.  General Appearance: Guarded  Eye Contact:  Fair  Speech:  Blocked and Slow  Volume:  Normal  Mood:  Anxious and Dysphoric  Affect:  Blunt  Thought Process:  Irrelevant and Descriptions of Associations: Circumstantial  Orientation:  Full (Time, Place, and Person)  Thought Content:  Delusions, Hallucinations: Auditory and Rumination  Suicidal Thoughts:  No  Homicidal Thoughts:  No  Memory:  Immediate;   Fair Recent;   Fair Remote;   Poor  Judgement:  Impaired  Insight:  Shallow  Psychomotor Activity:  Decreased  Concentration:  Concentration: Fair and Attention Span: Fair   Recall:  [employer]Fair  Fund of Knowledge:  Fair  Language:  Fair  Akathisia:  No  Handed:  Right  AIMS (if indicated):     Assets:  Social Support  ADL's:  Impaired  Cognition:  WNL  Sleep:  Number of Hours: 4   Treatment Plan Summary: Patient with schizophrenia , continues to have thought blocking , psychomotor retardation , continues to need medication readjustment and treatment.   Will continue today 05/15/17  plan as below except where it is noted.    Daily contact with patient to assess and evaluate symptoms and progress in treatment, Medication management and Plan see below  -Continue Abilify 10 bid for psychosis. Plan to discharge on  Abilify Maintena IM .  -Continue Depakote ER 1000 mg po qhs for mood symptoms. Depakote level  reordered for 05/13/17- 68, within therapeutic range .  -Add wellbutrin 75 mg po daily for negative sx.  -Continue Trazodone 50 mg po qhs for insomnia.  -CSW will continue to work on disposition.   Tiwanna Tuch, MD 05/15/2017, 2:29 PMPatient ID: [name], female   DOB: May 19, 1996, 21 y.o.

## 2017-05-15 NOTE — Progress Notes (Signed)
DAR NOTE: Pt present with flat affect and depressed mood in the unit. Pt has been isolating herself and has been in bed most of the time. Pt denies physical pain, took all her meds as scheduled.  Pt rate depression at 0, hopeless ness at 0, and anxiety at 0. Pt's safety ensured with 15 minute and environmental checks. Pt currently denies SI/HI and A/V hallucinations. Pt verbally agrees to seek staff if SI/HI or A/VH occurs and to consult with staff before acting on these thoughts. Will continue POC.

## 2017-05-15 NOTE — [facility] Group Notes (Signed)
[facility] LCSW Group Therapy  05/15/2017 , 1:58 PM   Type of Therapy:  Group Therapy  Participation Level:  Active  Participation Quality:  Attentive  Affect:  Appropriate  Cognitive:  Alert  Insight:  Improving  Engagement in Therapy:  Engaged  Modes of Intervention:  Discussion, Exploration and Socialization  Summary of Progress/Problems: Today's group focused on the term Diagnosis.  Participants were asked to define the term, and then pronounce whether it is a negative, positive or neutral term. Stayed the entire time, engaged throughout.  "I need to be a nicer person-less confrontational."  Unable to give any examples.  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 Daryel Geraldorth, Byron Peacock B 05/15/2017 , 1:58 PM

## 2017-05-15 NOTE — Progress Notes (Signed)
D: Pt continue to be very, delusional, disorganized and hyper-religious. Pt is preoccupied about going home. Pt at the time of assessment denied depression, anxiety, pain, SI, HI or AVH; states, "I have been here for more than a week or weeks now; I am doing fine, there is nothing wrong with me; I need to go home." Pt remains disoriented to time and situation.  A: Medications offered as prescribed. All patient's questions and concerns addressed. Support, encouragement, and safe environment provided. R: 15-minute safety checks continue. Pt was med compliant. Pt did not attend wrap-up group.

## 2017-05-16 MED ORDER — ARIPIPRAZOLE ER 400 MG IM SRER
400.0000 mg | INTRAMUSCULAR | Status: DC
  Administered 2017-05-16: 400 mg via INTRAMUSCULAR

## 2017-05-16 MED ORDER — TRAZODONE HCL 100 MG PO TABS
100.0000 mg | ORAL_TABLET | Freq: Every day | ORAL | Status: DC
  Administered 2017-05-16: 100 mg via ORAL
  Filled 2017-05-16: qty 7
  Filled 2017-05-16 (×2): qty 1

## 2017-05-16 NOTE — Progress Notes (Signed)
Adult Psychoeducational Group Note  Date:  05/16/2017 Time:  9:14 PM  Group Topic/Focus:  Wrap-Up Group:   The focus of this group is to help patients review their daily goal of treatment and discuss progress on daily workbooks.  Participation Level:  Minimal  Participation Quality:  Drowsy  Affect:  Flat  Cognitive:  Confused  Insight: Limited  Engagement in Group:  Engaged  Modes of Intervention:  Socialization and Support  Additional Comments:  Patient attended and participated in group tonight. She reports that she went outside today. She is due to be discharge on Friday.  Today she received her medication and had a shot which will last her for one month.  [facility]  Lita MainsFrancis, Naif Alabi [facility] 05/16/2017, 9:14 PM

## 2017-05-16 NOTE — [facility] Group Notes (Signed)
[facility] LCSW Group Therapy  05/16/2017 3:16 PM   Type of Therapy:  Group Therapy  Participation Level:  Active  Participation Quality:  Attentive  Affect:  Appropriate  Cognitive:  Appropriate  Insight:  Improving  Engagement in Therapy:  Engaged  Modes of Intervention:  Clarification, Education, Exploration and Socialization  Summary of Progress/Problems: Today's group focused on resilience.  Lindsey Richmond attended group.  Lindsey Richmond felt she was resilient with the help of the nursing and medication at the hospital.  She also felt that school has been a point of resilience for her and she wants to return to school after being discharged.  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    Lindsey Richmond, Lindsey Richmond 05/16/2017 , 3:16 PM

## 2017-05-16 NOTE — Progress Notes (Signed)
Nursing Progress Note: 7p-7a D: Pt currently presents with a pleasant/thought blocking affect and behavior. Pt states "I had a good day today. I feel a lot better. The voices have finally stopped." Interacting appropriately with the milieu. Pt reports good sleep during the previous night with current medication regimen.   A: Pt provided with medications per providers orders. Pt's labs and vitals were monitored throughout the night. Pt supported emotionally and encouraged to express concerns and questions. Pt educated on medications.  R: Pt's safety ensured with 15 minute and environmental checks. Pt currently denies SI, HI, and AVH. Pt verbally contracts to seek staff if SI,HI, or AVH occurs and to consult with staff before acting on any harmful thoughts. Will continue to monitor.

## 2017-05-16 NOTE — Progress Notes (Signed)
Patient denies SI, HI and AVH.  Patient has been in a brighter mood this shift.  Patient has attended groups and was noted to be engaged with peers.  Patient's speech remains religiously focused.   Assess patiet for safety, offer medications as prescribed, engage patient in 1:1 staff talks.  Patient able to contract for safety.  Continue to monitor as prescribed.

## 2017-05-16 NOTE — Progress Notes (Signed)
[name] MD Progress Note  05/16/2017 1:28 PM Lindsey Richmond  MRN:  [id number]  Subjective: Patient states " I am ok."    Objective:Patient seen and chart reviewed.Discussed patient with treatment team.  Pt seen today , seen with less psychomotor retardation, her affect is more reactive , she smiled a few times in response to questions asked. Pt reports AH as better . Per RN , she is more redirectable and however continues to be hyperreligious and calls out often " Repent ". Pt has been tolerating medications well, denies ADRs. Continue treatment.      Principal Problem: Schizophrenia, paranoid (HCC)  Diagnosis:   Patient Active Problem List   Diagnosis Date Noted  . Schizophrenia, paranoid (HCC) [F20.0] 04/20/2017    Priority: High   Total Time spent with patient: 25 minutes  Past Psychiatric History: Paranoid Schizophrenia.   Past Medical History: See H&P.  Family History:  Family History  Problem Relation Age of Onset  . Heart failure Mother   . Hypertension Mother   . Diabetes Father    Family Psychiatric  History: Please see H&P.  Social History: Please see H&P.  History  Alcohol Use No     History  Drug Use No    Social History   Social History  . Marital status: Single    Spouse name: N/A  . Number of children: N/A  . Years of education: N/A   Social History Main Topics  . Smoking status: Never Smoker  . Smokeless tobacco: Never Used  . Alcohol use No  . Drug use: No  . Sexual activity: Yes    Birth control/ protection: Pill   Other Topics Concern  . None   Social History Narrative  . None   Additional Social History:   Sleep: Fair  Appetite:  Fair  Current Medications: Current Facility-Administered Medications  Medication Dose Route Frequency Provider Last Rate Last Dose  . acetaminophen (TYLENOL) tablet 650 mg  650 mg Oral Q6H PRN Laveda Abbe, NP      . alum & mag hydroxide-simeth (MAALOX/MYLANTA) 200-200-20 MG/5ML  suspension 30 mL  30 mL Oral Q4H PRN Laveda Abbe, NP      . ARIPiprazole (ABILIFY) tablet 10 mg  10 mg Oral Deidre Ala, MD   10 mg at 05/16/17 4098  . ARIPiprazole ER SRER 400 mg  400 mg Intramuscular Q28 days Akia Montalban, [name], MD      . benztropine (COGENTIN) tablet 0.5 mg  0.5 mg Oral QHS Jurline Folger, MD   0.5 mg at 05/15/17 2312  . benztropine (COGENTIN) tablet 0.5 mg  0.5 mg Oral Daily Quayshaun Hubbert, MD   0.5 mg at 05/16/17 0808  . [facility]) tablet 75 mg  75 mg Oral q morning - 10a Raford Brissett, MD   75 mg at 05/16/17 1008  . divalproex (DEPAKOTE ER) 24 hr tablet 1,000 mg  1,000 mg Oral QHS Jourdain Guay, MD   1,000 mg at 05/15/17 2312  . hydrOXYzine (ATARAX/VISTARIL) tablet 25 mg  25 mg Oral Q6H PRN Laveda Abbe, NP   25 mg at 05/10/17 1952  . LORazepam (ATIVAN) tablet 1 mg  1 mg Oral Q6H PRN Jomarie Longs, MD   1 mg at 05/13/17 2232   Or  . LORazepam (ATIVAN) injection 1 mg  1 mg Intramuscular Q6H PRN Jomarie Longs, MD   1 mg at 05/10/17 0302  . magnesium hydroxide (MILK OF MAGNESIA) suspension 30 mL  30 mL Oral Daily  PRN Laveda AbbeParks, Laurie Britton, NP      . traZODone (DESYREL) tablet 100 mg  100 mg Oral QHS Makayle Krahn, MD      . ziprasidone (GEODON) capsule 20 mg  20 mg Oral TID PRN Jomarie LongsEappen, Tennelle Taflinger, MD   20 mg at 05/13/17 2232   Or  . ziprasidone (GEODON) injection 10 mg  10 mg Intramuscular TID PRN Jomarie LongsEappen, Ceniya Fowers, MD       Lab Results:  No results found for this or any previous visit (from the past 48 hour(s)). Blood Alcohol level:  Lab Results  Component Value Date   ETH <5 05/05/2017   ETH <5 04/17/2017   Metabolic Disorder Labs: Lab Results  Component Value Date   HGBA1C 4.9 05/09/2017   MPG 93.93 05/09/2017   Lab Results  Component Value Date   PROLACTIN 102.0 (H) 05/09/2017   Lab Results  Component Value Date   CHOL 150 05/09/2017   TRIG 53 05/09/2017   HDL 33 (L) 05/09/2017   CHOLHDL 4.5 05/09/2017    VLDL 11 05/09/2017   LDLCALC 106 (H) 05/09/2017   Physical Findings: AIMS: Facial and Oral Movements Muscles of Facial Expression: None, normal Lips and Perioral Area: None, normal Jaw: None, normal Tongue: None, normal,Extremity Movements Upper (arms, wrists, hands, fingers): None, normal Lower (legs, knees, ankles, toes): None, normal, Trunk Movements Neck, shoulders, hips: None, normal, Overall Severity Severity of abnormal movements (highest score from questions above): None, normal Incapacitation due to abnormal movements: None, normal Patient's awareness of abnormal movements (rate only patient's report): No Awareness, Dental Status Current problems with teeth and/or dentures?: No Does patient usually wear dentures?: No  CIWA:  CIWA-Ar Total: 1 COWS:  COWS Total Score: 2  Musculoskeletal: Strength & Muscle Tone: within normal limits Gait & Station: normal Patient leans: N/A  Psychiatric Specialty Exam: Physical Exam  Nursing note and vitals reviewed.   Review of Systems  Psychiatric/Behavioral: Positive for hallucinations. The patient is nervous/anxious.   All other systems reviewed and are negative.   Blood pressure (!) 107/56, pulse 100, temperature 99.3 F (37.4 C), resp. rate 18, height 5\' 4"  (1.626 m), weight 83.5 kg (184 lb).Body mass index is 31.58 kg/m.  General Appearance: Guarded  Eye Contact:  Fair  Speech:  Slow  Volume:  Normal  Mood:  Anxious and Dysphoric  Affect:  reactive , smiles  Thought Process:  Linear and Descriptions of Associations: Circumstantial  Orientation:  Full (Time, Place, and Person)  Thought Content:  Delusions, Hallucinations: Auditory and Rumination improving   Suicidal Thoughts:  No  Homicidal Thoughts:  No  Memory:  Immediate;   Fair Recent;   Fair Remote;   Poor  Judgement:  Impaired  Insight:  Shallow  Psychomotor Activity:  Decreased  Concentration:  Concentration: Fair and Attention Span: Fair  Recall:  [employer]Fair  Fund  of Knowledge:  Fair  Language:  Fair  Akathisia:  No  Handed:  Right  AIMS (if indicated):     Assets:  Social Support  ADL's:  Intact  Cognition:  WNL  Sleep:  Number of Hours: 3.25   Treatment Plan Summary: Patient with schizophrenia , her thought blocking has improved , she continues to be hyper religious , repeating phrases like " repent " , continues to need medication management.   Will continue today 05/16/17 plan as below except where it is noted.    Daily contact with patient to assess and evaluate symptoms and progress in treatment, Medication management and  Plan see below  -Continue Abilify 10 bid for psychosis. Will offer Abilify Maintena IM 400 mg x 1 dose today - 05/16/2017 - repeat q28 days .  -Continue Depakote ER 1000 mg po qhs for mood symptoms. Depakote level  reordered for 05/13/17- 68, within therapeutic range .  -Wellbutrin 75 mg po daily for negative sx.  -Increase Trazodone to 100 mg po qhs for insomnia.  -CSW will continue to work on disposition.   Ivee Poellnitz, MD 05/16/2017, 1:28 PMPatient ID: [name], female   DOB: 01/25/1996, 21 y.o.

## 2017-05-16 NOTE — Progress Notes (Signed)
  D: Pt was laying in bed during the assessment. Pt appeared to be more subdued today than previous days. Writer informed pt that it was time to take her meds. Pt stated, "I'm not taking them." Writer reminded pt that she's in the hospital and her doctor ordered meds to help her feel better. Pt continued to refuse. Writer became more direct and assertive and instructed the pt to sit up in bed and take her meds. Pt sat up, and after several seconds did take her prescribed medications. Pt asked the writer about her discharge date, stating that she "feels better". Encouraged pt to continue taking her medications and attending groups. Pt began chanting "God is real". Pt started softly, but got louder as she repeated. Pt has no questions or concerns.    ,A:  Support and encouragement was offered. 15 min checks continued for safety.  R: Pt remains safe.

## 2017-05-17 DIAGNOSIS — R440 Auditory hallucinations: Secondary | ICD-10-CM

## 2017-05-17 MED ORDER — BUPROPION HCL 75 MG PO TABS: 75.0000 mg | ORAL_TABLET | Freq: Every morning | ORAL | 0 refills | Status: DC

## 2017-05-17 MED ORDER — ARIPIPRAZOLE 10 MG PO TABS: 10.0000 mg | ORAL_TABLET | ORAL | 0 refills | Status: DC

## 2017-05-17 MED ORDER — TRAZODONE HCL 100 MG PO TABS: 100.0000 mg | ORAL_TABLET | Freq: Every day | ORAL | 0 refills | Status: DC

## 2017-05-17 MED ORDER — BENZTROPINE MESYLATE 0.5 MG PO TABS
0.5000 mg | ORAL_TABLET | ORAL | Status: DC
  Filled 2017-05-17 (×2): qty 1

## 2017-05-17 MED ORDER — HYDROXYZINE HCL 25 MG PO TABS: 25.0000 mg | ORAL_TABLET | Freq: Four times a day (QID) | ORAL | 0 refills | Status: DC | PRN

## 2017-05-17 MED ORDER — BENZTROPINE MESYLATE 0.5 MG PO TABS: ORAL_TABLET | ORAL | 0 refills | Status: DC

## 2017-05-17 MED ORDER — DIVALPROEX SODIUM ER 500 MG PO TB24
1000.0000 mg | ORAL_TABLET | Freq: Every day | ORAL | 0 refills | Status: DC
Start: 2017-05-17 — End: 2019-01-17

## 2017-05-17 MED ORDER — ARIPIPRAZOLE ER 400 MG IM SRER: 400.0000 mg | INTRAMUSCULAR | 0 refills | Status: DC

## 2017-05-17 NOTE — [facility] Suicide Risk Assessment (Signed)
[facility]   Principal Problem: Schizophrenia, paranoid [facility](HCC) Discharge Diagnoses:  Patient Active Problem List   Diagnosis Date Noted  . Schizophrenia, paranoid (HCC) [F20.0] 04/20/2017    Total Time spent with patient: 30 minutes  Musculoskeletal: Strength & Muscle Tone: within normal limits Gait & Station: normal Patient leans: N/A  Psychiatric Specialty Exam: ROS  Blood pressure (!) 107/56, pulse 100, temperature 99.3 F (37.4 C), resp. rate 18, height 5\' 4"  (1.626 m), weight 83.5 kg (184 lb).Body mass index is 31.58 kg/m.  General Appearance: Casual  Eye Contact::  Good  Speech:  Clear and Coherent409  Volume:  Decreased  Mood:  Euthymic  Affect:  Appropriate and Congruent  Thought Process:  Coherent and Goal Directed  Orientation:  Full (Time, Place, and Person)  Thought Content:  WDL and continue to be hyper religious.  Suicidal Thoughts:  No  Homicidal Thoughts:  No  Memory:  Immediate;   Good Recent;   Fair Remote;   Fair  Judgement:  Fair  Insight:  Fair  Psychomotor Activity:  Decreased  Concentration:  Good  Recall:  Good  Fund of Knowledge:Good  Language: Good  Akathisia:  Negative  Handed:  Right  AIMS (if indicated):     Assets:  Communication Skills Desire for Improvement Financial Resources/Insurance Housing Leisure Time Physical Health Resilience Social Support Talents/Skills Transportation Vocational/Educational  Sleep:  Number of Hours: 6.5  Cognition: WNL  ADL's:  Intact   Mental Status Per Nursing Assessment::   On Admission:  NA  Demographic Factors:  Adolescent or young adult  Loss Factors: NA  Historical Factors: NA  Risk Reduction Factors:   Sense of responsibility to family, Religious beliefs about death, Living with another person, especially a relative, Positive social support, Positive therapeutic relationship and Positive coping skills or problem solving skills  Continued Clinical  Symptoms:  Bipolar Disorder:   Mixed State Schizophrenia:   Less than 19 years old Paranoid or undifferentiated type Previous Psychiatric Diagnoses and Treatments  Cognitive Features That Contribute To Risk:  Polarized thinking    Suicide Risk:  Minimal: No identifiable suicidal ideation.  Patients presenting with no risk factors but with morbid ruminations; may be classified as minimal risk based on the severity of the depressive symptoms  Follow-up Information    [facility] on 05/17/2017.   Why:  Thursday at 4:30 with Dr Hortencia PilarA Contact information: [address] [place] [address] [phone]           Plan Of Care/Follow-up recommendations:  Activity:  As tolerated. Diet:  Regular.  Leata MouseJANARDHANA Lessa Huge, MD 05/17/2017, 9:45 AM

## 2017-05-17 NOTE — Progress Notes (Signed)
  [facility] :  Will you be returning to the same living situation after discharge:  Yes,  home At discharge, do you have transportation home?: Yes,  family Do you have the ability to pay for your medications: Yes,  family will pay  Release of information consent forms completed and in the chart;  Patient's signature needed at discharge.  Patient to Follow up at: Follow-up Information    [facility] Follow up on 05/23/2017.   Why:  Wednesday at 10:30. Please bring in hospital d/c paperwork.  Also, be prepared to pay $75.00 to $150.00 out of pocket for your appointment. Contact information: [address] [place] [address] [phone]           Next level of care provider has access to [facility] Link:no  Safety Planning and Suicide Prevention discussed: Yes,  yes  Have you used any form of tobacco in the last 30 days? (Cigarettes, Smokeless Tobacco, Cigars, and/or Pipes): No  Has patient been referred to the Quitline?: N/A patient is not a smoker  Patient has been referred for addiction treatment: N/A  Lindsey RogueRodney B Raynaldo Falco, LCSW 05/17/2017, 10:43 AM

## 2017-05-17 NOTE — Discharge Summary (Signed)
Physician Discharge Summary Note  Patient:  Lindsey Richmond is an 21 y.o., female MRN:  [id number] DOB:  [birth year] Patient phone:  [phone] (home)   Patient address:   [address] [place] [address],   Total Time spent with patient: Greater than 30 minutes  Date of Admission:  05/07/2017 Date of Discharge: 05-17-17  Reason for Admission: Psychosis & disorganized behavior.    Principal Problem: Schizophrenia, paranoid-type.  Discharge Diagnoses: Patient Active Problem List   Diagnosis Date Noted  . Schizophrenia, paranoid (HCC) [F20.0] 04/20/2017    Priority: High   Past Psychiatric History: Hx. Schizophrenia  Past Medical History: History reviewed. No pertinent past medical history. History reviewed. No pertinent surgical history.  Family History:  Family History  Problem Relation Age of Onset  . Heart failure Mother   . Hypertension Mother   . Diabetes Father    Family Psychiatric  History: See H&P  Social History:  History  Alcohol Use No     History  Drug Use No    Social History   Social History  . Marital status: Single    Spouse name: N/A  . Number of children: N/A  . Years of education: N/A   Social History Main Topics  . Smoking status: Never Smoker  . Smokeless tobacco: Never Used  . Alcohol use No  . Drug use: No  . Sexual activity: Yes    Birth control/ protection: Pill   Other Topics Concern  . None   Social History Narrative  . 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 Lindsey Richmond was seen in her room, she was a bit groggy from the psychotropic medications she received, however was alert and attempted to answer questions asked. However , since she is a poor historian at this time, attempted to contact her mother Ita Fritzsche - # noted in EHR. However mother is not reachable. Hence majority of information obtained from  EHR. She was brought in to ED with bizarre behavior, seemed to be hyperreligious, talked about Jesus christ, responding to internal stimuli . She was recently discharged from [facility] 7/18- 04/25/17. At that time was diagnosed with schizophrenia, and was discharged on Depakote ER 1000 mg and Risperdal 3 mg po qhs . Pt today reports that she was not compliant with medications after discharge.  Lindsey Richmond was discharged from this hospital after receiving mood stabilization treatments. She was discharged with a referral & an appointment to continue mental health care on an outpatient basis. Lindsey Richmond re-admitted to the [facility] adult unit for worsening symptoms of Schizophrenia (Psychosis, hyper-religiosity, bizarre & disorganiced behavior). Reports indicated that she was not compliant with her treatment regimen after discharge. She presented to  the hospital psychotic, paranoid & hyper-religious. She came to the hospital for further mood stabilization treatments.  After re-evaluation of her presenting symptoms, the medication regimen targeting those symptoms were initiated. She received & was discharged on; Abilify 10 mg for mood control, Abilify ER injectable 400 mg Q 28 days (due on 06-13-17) for mood control, Cogentin 0.5 mg for prevention of EPS, Depakote 1,000 mg for mood stabilization, Hydroxyzine 25 mg prn for anxiety & Trazodone 100 mg for insomnia.  Lindsey Richmond was oriented to the unit and encouraged to participate in the unit programming. However, she presented too acute on admission warranting the need for 1:1 supervision for most of  her days here in the hospital. She presented no other significant pre-existing medical problems that required treatment. She tolerated her treatment regimen without any adverse effects or reactions reported.  During the course of her present hospitalization, Lindsey Richmond was evaluated daily by a clinical provider to ascertain her response to her treatment regimen. As the days go by, improvement was  noted as evidenced by her report of decreasing symptoms, improved sleep, mood, affect & participation in the unit programming.  She was required on daily basis to complete a self-inventory asssessment noting mood, mental status, any new symptoms, anxiety or concerns. Her symptoms responded well to her treatment regimen, being in a therapeutic & supportive environment also assisted in her mood stability. Her room was declared do not admit to other patients as Lindsey Richmond remained acute in most days responding to some internal stimuli.  On this day of her hospital discharge, Lindsey Richmond is in much improved condition than upon admission. Her symptoms were reported as significantly decreased or resolved completely. Upon discharge, she denies SIHI and voiced no AVH. However, she still retained a litltle bit of her hyper-religousity, only that it was not as intense or intrusive. Lindsey Richmond was motivated to continue taking medication with a goal of continued improvement in her mental health. She is discharged to follow-up care on an outpatient basis as noted below. She is provided with all the necessary information needed to make this appointment without problems. She left [facility] in no apparent distress with all personal belongings. Transportation per family.  Physical Findings: AIMS: Facial and Oral Movements Muscles of Facial Expression: None, normal Lips and Perioral Area: None, normal Jaw: None, normal Tongue: None, normal,Extremity Movements Upper (arms, wrists, hands, fingers): None, normal Lower (legs, knees, ankles, toes): None, normal, Trunk Movements Neck, shoulders, hips: None, normal, Overall Severity Severity of abnormal movements (highest score from questions above): None, normal Incapacitation due to abnormal movements: None, normal Patient's awareness of abnormal movements (rate only patient's report): No Awareness, Dental Status Current problems with teeth and/or dentures?: No Does patient usually wear  dentures?: No  CIWA:  CIWA-Ar Total: 1 COWS:  COWS Total Score: 2  Musculoskeletal: Strength & Muscle Tone: within normal limits Gait & Station: normal Patient leans: N/A  Psychiatric Specialty Exam: Physical Exam  Constitutional: She appears well-developed.  HENT:  Head: Normocephalic.  Eyes: Pupils are equal, round, and reactive to light.  Neck: Normal range of motion.  Cardiovascular: Normal rate.   Respiratory: Effort normal.  GI: Soft.  Genitourinary:  Genitourinary Comments: Deferred  Musculoskeletal: Normal range of motion.  Neurological: She is alert.  Skin: Skin is warm.    Review of Systems  Constitutional: Negative.   HENT: Negative.   Eyes: Negative.   Respiratory: Negative.   Cardiovascular: Negative.   Gastrointestinal: Negative.   Genitourinary: Negative.   Musculoskeletal: Negative.   Skin: Negative.   Neurological: Negative.   Endo/Heme/Allergies: Negative.   Psychiatric/Behavioral: Positive for depression (Stable) and hallucinations (Hx. auditory hallucinations, delusions, hyper-religious). Negative for memory loss, substance abuse and suicidal ideas. The patient has insomnia (Stable). The patient is not nervous/anxious.     Blood pressure (!) 107/56, pulse 100, temperature 99.3 F (37.4 C), resp. rate 18, height 5\' 4"  (1.626 m), weight 83.5 kg (184 lb).Body mass index is 31.58 kg/m.  See Md's SRA   Have you used any form of tobacco in the last 30 days? (Cigarettes, Smokeless Tobacco, Cigars, and/or Pipes): No  Has this patient used any form of tobacco in the last  30 days? (Cigarettes, Smokeless Tobacco, Cigars, and/or Pipes): No  Blood Alcohol level:  Lab Results  Component Value Date   ETH <5 05/05/2017   ETH <5 04/17/2017   Metabolic Disorder Labs:  Lab Results  Component Value Date   HGBA1C 4.9 05/09/2017   MPG 93.93 05/09/2017   Lab Results  Component Value Date   PROLACTIN 102.0 (H) 05/09/2017   Lab Results  Component Value Date    CHOL 150 05/09/2017   TRIG 53 05/09/2017   HDL 33 (L) 05/09/2017   CHOLHDL 4.5 05/09/2017   VLDL 11 05/09/2017   LDLCALC 106 (H) 05/09/2017    See Psychiatric Specialty Exam and Suicide Risk Assessment completed by Attending Physician prior to discharge.  Discharge destination:  Home  Is patient on multiple antipsychotic therapies at discharge:  No   Has Patient had three or more failed trials of antipsychotic monotherapy by history:  No  Recommended Plan for Multiple Antipsychotic Therapies: NA  Allergies as of 05/17/2017   No Known Allergies     Medication List    STOP taking these medications   LORazepam 2 MG tablet Commonly known as:  ATIVAN   risperiDONE 1 MG disintegrating tablet Commonly known as:  RISPERDAL M-TABS   risperidone 3 MG disintegrating tablet Commonly known as:  RISPERDAL M-TABS     TAKE these medications     Indication  ARIPiprazole 10 MG tablet Commonly known as:  ABILIFY Take 1 tablet (10 mg total) by mouth 2 (two) times daily in the am and at bedtime.. For mood control  Indication:  Mood control   ARIPiprazole ER 400 MG Srer Inject 400 mg into the muscle every 28 (twenty-eight) days. (Due on 06-13-17): For mood control  Indication:  Mood control   benztropine 0.5 MG tablet Commonly known as:  COGENTIN Take 1 tablet (0.5 mg) in the mornings & at bedtime: For prevention of drug induced tremors. What changed:  how much to take  how to take this  when to take this  reasons to take this  additional instructions  Indication:  Extrapyramidal Reaction caused by Medications   buPROPion 75 MG tablet Commonly known as:  WELLBUTRIN Take 1 tablet (75 mg total) by mouth every morning. For depression  Indication:  Major Depressive Disorder   divalproex 500 MG 24 hr tablet Commonly known as:  DEPAKOTE ER Take 2 tablets (1,000 mg total) by mouth at bedtime. For mood stabilization  Indication:  Mood stabilization   hydrOXYzine 25 MG  tablet Commonly known as:  ATARAX/VISTARIL Take 1 tablet (25 mg total) by mouth every 6 (six) hours as needed for anxiety.  Indication:  Feeling Anxious   traZODone 100 MG tablet Commonly known as:  DESYREL Take 1 tablet (100 mg total) by mouth at bedtime. For insomnia  Indication:  Trouble Sleeping      Follow-up Information    [facility] Follow [facility] on 05/23/2017.   Why:  Wednesday at 10:30. Please bring in hospital d/c paperwork.  Also, be prepared to pay $75.00 to $150.00 out of pocket for your appointment. Contact information: [address] [place] [address] [phone]          Follow-up recommendations: Activity:  As tolerated Diet: As recommended by your primary care doctor. Keep all scheduled follow-up appointments as recommended.   Comments: Patient is instructed prior to discharge to: Take all medications as prescribed by his/her mental healthcare provider. Report any adverse effects and or reactions from  the medicines to his/her outpatient provider promptly. Patient has been instructed & cautioned: To not engage in alcohol and or illegal drug use while on prescription medicines. In the event of worsening symptoms, patient is instructed to call the crisis hotline, 911 and or go to the nearest ED for appropriate evaluation and treatment of symptoms. To follow-up with his/her primary care provider for your other medical issues, concerns and or health care needs.   Signed: Sanjuana KavaNwoko, Thomes Burak I, NP, PMHNP, FNP-BC 05/17/2017, 10:45 AM

## 2017-05-17 NOTE — Progress Notes (Signed)
Patient discharged to lobby. Patient was stable and appreciative at that time. All papers, samples and prescriptions were given and valuables returned. Verbal understanding expressed. Denies SI/HI and A/VH. Patient given opportunity to express concerns and ask questions.  

## 2017-05-17 NOTE — Tx Team (Signed)
Interdisciplinary Treatment and Diagnostic Plan Update  05/17/2017 Time of Session: 10:48 AM  Lindsey Richmond MRN: [id number]  Principal Diagnosis: Schizophrenia, paranoid ([place])  Secondary Diagnoses: Principal Problem:   Schizophrenia, paranoid ([place])   Current Medications:  Current Facility-Administered Medications  Medication Dose Route Frequency Provider Last Rate Last Dose  . acetaminophen (TYLENOL) tablet 650 mg  650 mg Oral Q6H PRN Ethelene Hal, NP      . alum & mag hydroxide-simeth (MAALOX/MYLANTA) 200-200-20 MG/5ML suspension 30 mL  30 mL Oral Q4H PRN Ethelene Hal, NP      . ARIPiprazole (ABILIFY) tablet 10 mg  10 mg Oral Brynda Greathouse, MD   10 mg at 05/17/17 0801  . ARIPiprazole ER SRER 400 mg  400 mg Intramuscular Q28 days Ursula Alert, MD   400 mg at 05/16/17 1536  . benztropine (COGENTIN) tablet 0.5 mg  0.5 mg Oral QHS Eappen, Saramma, MD   0.5 mg at 05/16/17 2038  . benztropine (COGENTIN) tablet 0.5 mg  0.5 mg Oral Daily Eappen, Saramma, MD   0.5 mg at 05/17/17 0801  . buPROPion [facility]) tablet 75 mg  75 mg Oral q morning - 10a Eappen, Saramma, MD   75 mg at 05/17/17 0800  . divalproex (DEPAKOTE ER) 24 hr tablet 1,000 mg  1,000 mg Oral QHS Eappen, Saramma, MD   1,000 mg at 05/16/17 2036  . hydrOXYzine (ATARAX/VISTARIL) tablet 25 mg  25 mg Oral Q6H PRN Ethelene Hal, NP   25 mg at 05/10/17 1952  . LORazepam (ATIVAN) tablet 1 mg  1 mg Oral Q6H PRN Ursula Alert, MD   1 mg at 05/13/17 2232   Or  . LORazepam (ATIVAN) injection 1 mg  1 mg Intramuscular Q6H PRN Ursula Alert, MD   1 mg at 05/10/17 0302  . magnesium hydroxide (MILK OF MAGNESIA) suspension 30 mL  30 mL Oral Daily PRN Ethelene Hal, NP      . traZODone (DESYREL) tablet 100 mg  100 mg Oral QHS Eappen, Ria Clock, MD   100 mg at 05/16/17 2036  . ziprasidone (GEODON) capsule 20 mg  20 mg Oral TID PRN Ursula Alert, MD   20 mg at 05/13/17 2232   Or  . ziprasidone  (GEODON) injection 10 mg  10 mg Intramuscular TID PRN Ursula Alert, MD        PTA Medications: Prescriptions Prior to Admission  Medication Sig Dispense Refill Last Dose  . benztropine (COGENTIN) 0.5 MG tablet Take 1 tablet (0.5 mg total) by mouth daily as needed for tremors. 30 tablet 0   . LORazepam (ATIVAN) 2 MG tablet Take 1 tablet (2 mg total) by mouth every 6 (six) hours as needed for anxiety (agitation. Please hold for oversedation.). 12 tablet 0   . risperiDONE (RISPERDAL M-TABS) 1 MG disintegrating tablet Take 1 tablet (1 mg total) by mouth 2 (two) times daily as needed (agitation). 60 tablet 0   . risperiDONE (RISPERDAL M-TABS) 3 MG disintegrating tablet Take 1 tablet (3 mg total) by mouth at bedtime. For mood control 30 tablet 0   . [DISCONTINUED] divalproex (DEPAKOTE ER) 500 MG 24 hr tablet Take 2 tablets (1,000 mg total) by mouth at bedtime. For mood stabilization 60 tablet 0     Patient Stressors: Financial difficulties Medication change or noncompliance  Patient Strengths: General fund of knowledge Physical Health Supportive family/friends  Treatment Modalities: Medication Management, Group therapy, Case management,  1 to 1 session with clinician, Psychoeducation, Recreational therapy.  Physician Treatment Plan for Primary Diagnosis: Schizophrenia, paranoid ([place]) Long Term Goal(s): Improvement in symptoms so as ready for discharge  Short Term Goals: Ability to identify changes in lifestyle to reduce recurrence of condition will improve Ability to verbalize feelings will improve Compliance with prescribed medications will improve Ability to identify changes in lifestyle to reduce recurrence of condition will improve Ability to verbalize feelings will improve Compliance with prescribed medications will improve  Medication Management: Evaluate patient's response, side effects, and tolerance of medication regimen.  Therapeutic Interventions: 1 to 1 sessions, Unit  Group sessions and Medication administration.  Evaluation of Outcomes: Adequate for Discharge   8/13: Patient with schizophrenia , continues to have mood lability , thought blocking and psychosis , slow progress . Continue treatment.  -Will increase Abilify to 10 bid for psychosis. Plan to discharge on Abilify Maintena IM .  -Continue Depakote ER 1000 mg po qhs for mood symptoms. Depakote level  reordered for 05/13/17- refused .  Trazodone 50 mg po qhs for insomnia.  Physician Treatment Plan for Secondary Diagnosis: Principal Problem:   Schizophrenia, paranoid ([place])   Long Term Goal(s): Improvement in symptoms so as ready for discharge  Short Term Goals: Ability to identify changes in lifestyle to reduce recurrence of condition will improve Ability to verbalize feelings will improve Compliance with prescribed medications will improve Ability to identify changes in lifestyle to reduce recurrence of condition will improve Ability to verbalize feelings will improve Compliance with prescribed medications will improve  Medication Management: Evaluate patient's response, side effects, and tolerance of medication regimen.  Therapeutic Interventions: 1 to 1 sessions, Unit Group sessions and Medication administration.  Evaluation of Outcomes: Adequate for Discharge   RN Treatment Plan for Primary Diagnosis: Schizophrenia, paranoid ([place]) Long Term Goal(s): Knowledge of disease and therapeutic regimen to maintain health will improve  Short Term Goals: Ability to identify and develop effective coping behaviors will improve and Compliance with prescribed medications will improve  Medication Management: RN will administer medications as ordered by provider, will assess and evaluate patient's response and provide education to patient for prescribed medication. RN will report any adverse and/or side effects to prescribing provider.  Therapeutic Interventions: 1 on 1 counseling sessions,  Psychoeducation, Medication administration, Evaluate responses to treatment, Monitor vital signs and CBGs as ordered, Perform/monitor CIWA, COWS, AIMS and Fall Risk screenings as ordered, Perform wound care treatments as ordered.  Evaluation of Outcomes: Adequate for Discharge   LCSW Treatment Plan for Primary Diagnosis: Schizophrenia, paranoid ([place]) Long Term Goal(s): Safe transition to appropriate next level of care at discharge, Engage patient in therapeutic group addressing interpersonal concerns.  Short Term Goals: Engage patient in aftercare planning with referrals and resources  Therapeutic Interventions: Assess for all discharge needs, 1 to 1 time with Social worker, Explore available resources and support systems, Assess for adequacy in community support network, Educate family and significant other(s) on suicide prevention, Complete Psychosocial Assessment, Interpersonal group therapy.  Evaluation of Outcomes: Met  Return home, [facility]   Progress in Treatment: Attending groups: Yes Participating in groups: Yes Taking medication as prescribed: Yes Toleration medication: Yes, no side effects reported at this time Family/Significant other contact made: Yes Patient understands diagnosis: Yes AEB asking for help with symptoms Discussing patient identified problems/goals with staff: Yes Medical problems stabilized or resolved: Yes Denies suicidal/homicidal ideation: Yes Issues/concerns per patient self-inventory: None Other: N/A  New problem(s) identified: None identified at this time.   New Short Term/Long Term Goal(s): "I have  a lot of anxiety all the time, and I feel a little confused."  Decrease anxiety and confusion   Discharge Plan or Barriers:   Reason for Continuation of Hospitalization:   Estimated Length of Stay: D/C today  Attendees: Patient:  05/17/2017  10:48 AM  Physician: Dr Lamar Benes MD 05/17/2017  10:48 AM  Nursing: Grayland Ormond  RN 05/17/2017  10:48 AM  RN Care Manager: Lars Pinks, RN 05/17/2017  10:48 AM  Social Worker: Ripley Fraise 05/17/2017  10:48 AM  Recreational Therapist: Winfield Cunas 05/17/2017  10:48 AM  Other: Norberto Sorenson 05/17/2017  10:48 AM  Other:  05/17/2017  10:48 AM    Scribe for Treatment Team:  Roque Lias LCSW 05/17/2017 10:48 AM

## 2018-05-08 ENCOUNTER — Ambulatory Visit (HOSPITAL_COMMUNITY)
Admission: RE | Admit: 2018-05-08 | Discharge: 2018-05-08 | Disposition: A | Discharge status: Home / Self Care | Payer: Federal, State, Local not specified - Other | Attending: Psychiatry | Admitting: Psychiatry

## 2018-05-08 DIAGNOSIS — Z1330 Encounter for screening examination for mental health and behavioral disorders, unspecified: Secondary | ICD-10-CM | POA: Insufficient documentation

## 2018-05-08 NOTE — H&P (Addendum)
Behavioral Health Medical Screening Exam  [name] is an 22 y.o. female.  Patient was pleasant and cooperative with me today and had a logical conversation about her medications.  She reported that she takes aripiprazole and got the injection yesterday.  She confirmed that she was taking her oral Abilify daily as well.  Mom had stepped out to go to the restroom and patient was carrying on a conversation with me.  Patient denied any SI/HI/AVH.  Patient is hyper religious, by saying that Jesus is real and that Jesus loves you.  Patient tells random strangers this but has not attempted to harm anyone and has not attempted to harm herself and this is been confirmed by patient's mother.  Mom did state that patient was not compliant with her medications up until about 3 days ago where she was restarted on her Abilify and was given the injection yesterday.  Mom states she [facility] at [facility] and will continue to follow up there.  Patient does not meet inpatient criteria.  Total Time spent with patient: 20 minutes  Psychiatric Specialty Exam: Physical Exam  Nursing note and vitals reviewed. Constitutional: She is oriented to person, place, and time. She appears well-developed and well-nourished.  Respiratory: Effort normal.  Musculoskeletal: Normal range of motion.  Neurological: She is alert and oriented to person, place, and time.  Skin: Skin is warm.    Review of Systems  Constitutional: Negative.   HENT: Negative.   Eyes: Negative.   Respiratory: Negative.   Cardiovascular: Negative.   Gastrointestinal: Negative.   Genitourinary: Negative.   Musculoskeletal: Negative.   Skin: Negative.   Neurological: Negative.   Endo/Heme/Allergies: Negative.   Psychiatric/Behavioral: Negative for depression, hallucinations, substance abuse and suicidal ideas. The patient is nervous/anxious.        Mom reports hyper-religious    Blood pressure 124/66, pulse (!) 55, temperature 98.4 F (36.9 C), resp.  rate 14, SpO2 100 %.There is no height or weight on file to calculate BMI.  General Appearance: Casual  Eye Contact:  Fair  Speech:  Clear and Coherent  Volume:  Normal  Mood:  Anxious  Affect:  Flat  Thought Process:  Goal Directed and Descriptions of Associations: Intact  Orientation:  Full (Time, Place, and Person)  Thought Content:  Rumination and hyper-religious  Suicidal Thoughts:  No  Homicidal Thoughts:  No  Memory:  Immediate;   Fair Recent;   Fair Remote;   Fair  Judgement:  Fair  Insight:  Lacking  Psychomotor Activity:  Decreased  Concentration: Concentration: Fair  Recall:  Good  Fund of Knowledge:Fair  Language: Good  Akathisia:  No  Handed:  Right  AIMS (if indicated):     Assets:  Desire for Improvement Financial Resources/Insurance Housing Physical Health Social Support Transportation  Sleep:       Musculoskeletal: Strength & Muscle Tone: within normal limits Gait & Station: normal Patient leans: N/A  Blood pressure 124/66, pulse (!) 55, temperature 98.4 F (36.9 C), resp. rate 14, SpO2 100 %.  Recommendations:  Based on my evaluation the patient does not appear to have an emergency medical condition.  Gerlene Burdockravis B Money, FNP 05/08/2018, 10:16 AM   Agree with NP assessment

## 2018-05-08 NOTE — BH Assessment (Signed)
Assessment Note  Lindsey Richmond is an 22 y.o. female. Pt arrived with her mother Lindsey Richmond (Pt's guardian). Pt was oriented to name and place. Pt was hyper-religious. Lindsey Richmond states that the Pt has not been taking her medication. Per Lindsey Richmond the Pt's behavior began to change about 2 weeks ago. Pt denies SI/HI and AVH. Per [name] the Pt has been walking up to neighbors and strangers telling them "God loves you," "God is real." The Pt is seen at [facility]. The Pt received her 1st Invega shot yesterday. The Pt is also taking oral medication. Lindsey Richmond was not certain of the name of the medications. Pt was hospitalized in 2018. No SA reported.   Feliz Beam, NP recommends D/C and follow-up with current providers.   Diagnosis:  F20.9 Schizophrenia  Past Medical History: No past medical history on file.  No past surgical history on file.  Family History:  Family History  Problem Relation Age of Onset  . Heart failure Mother   . Hypertension Mother   . Diabetes Father     Social History:  reports that she has never smoked. She has never used smokeless tobacco. She reports that she does not drink alcohol or use drugs.  Additional Social History:  Alcohol / Drug Use Pain Medications: please see mar Prescriptions: please see mar Over the Counter: please see mar History of alcohol / drug use?: No history of alcohol / drug abuse Longest period of sobriety (when/how long): NA  CIWA:   COWS:    Allergies: No Known Allergies  Home Medications:  (Not in a hospital admission)  OB/GYN Status:  No LMP recorded.  General Assessment Data Location of Assessment: [facility] Assessment Services TTS Assessment: In system Is this a Tele or Face-to-Face Assessment?: Face-to-Face Is this an Initial Assessment or a Re-assessment for this encounter?: Initial Assessment Marital status: Single Maiden name: NA Is patient pregnant?: No Pregnancy Status: No Living Arrangements: Parent Can pt return to current living  arrangement?: Yes Admission Status: Voluntary Is patient capable of signing voluntary admission?: Yes Referral Source: Self/Family/Friend Insurance type: Surveyor, minerals Exam [place] Walk-in ONLY) Medical Exam completed: Yes  Crisis Care Plan Living Arrangements: Parent Legal Guardian: Mother Name of Psychiatrist: Transport planner Name of Therapist: [facility]  Education Status Is patient currently in school?: Yes Current Grade: college Highest grade of school patient has completed: 12 Name of school: [employer] person: NA  Risk to self with the past 6 months Suicidal Ideation: No Has patient been a risk to self within the past 6 months prior to admission? : No Suicidal Intent: No Has patient had any suicidal intent within the past 6 months prior to admission? : No Is patient at risk for suicide?: No Suicidal Plan?: No Has patient had any suicidal plan within the past 6 months prior to admission? : No Access to Means: No What has been your use of drugs/alcohol within the last 12 months?: NA Previous Attempts/Gestures: No How many times?: 0 Other Self Harm Risks: NA Triggers for Past Attempts: None known Intentional Self Injurious Behavior: None Family Suicide History: No Recent stressful life event(s): Other (Comment)(none) Persecutory voices/beliefs?: No Depression: No Depression Symptoms: (Pt unable to report) Substance abuse history and/or treatment for substance abuse?: No Suicide prevention information given to non-admitted patients: Not applicable  Risk to Others within the past 6 months Homicidal Ideation: No Does patient have any lifetime risk of violence toward others beyond the six months prior to admission? : No Thoughts of Harm to  Others: No Current Homicidal Intent: No Current Homicidal Plan: No Access to Homicidal Means: No Identified Victim: NA History of harm to others?: No Assessment of Violence: None Noted Violent Behavior Description: NA Does  patient have access to weapons?: No Criminal Charges Pending?: No Does patient have a court date: No Is patient on probation?: No  Psychosis Hallucinations: None noted Delusions: None noted  Mental Status Report Appearance/Hygiene: Unremarkable Eye Contact: Fair Motor Activity: Freedom of movement Speech: Tangential Level of Consciousness: Alert Mood: Anxious Affect: Anxious Anxiety Level: Moderate Thought Processes: Flight of Ideas, Tangential Judgement: Impaired Orientation: Person Obsessive Compulsive Thoughts/Behaviors: None  Cognitive Functioning Concentration: Decreased Memory: Recent Impaired, Remote Impaired Is patient IDD: No Is patient DD?: No Insight: Poor Impulse Control: Poor Appetite: Fair Have you had any weight changes? : No Change Sleep: No Change Total Hours of Sleep: 7 Vegetative Symptoms: None  ADLScreening [facility] Assessment Services) Patient's cognitive ability adequate to safely complete daily activities?: Yes Patient able to express need for assistance with ADLs?: Yes Independently performs ADLs?: Yes (appropriate for developmental age)  Prior Inpatient Therapy Prior Inpatient Therapy: Yes Prior Therapy Dates: 2018 [facility] Reason for Treatment: Schizophrenia  Prior Outpatient Therapy Prior Outpatient Therapy: Yes Prior Therapy Dates: current Prior Therapy Facilty/Provider(s): [facility] Reason for Treatment: Schizophrenia Does patient have an ACCT team?: No Does patient have Intensive In-House Services?  : No Does patient have [facility] services? : No Does patient have P4CC services?: No  ADL Screening (condition at time of admission) Patient's cognitive ability adequate to safely complete daily activities?: Yes Is the patient deaf or have difficulty hearing?: No Does the patient have difficulty seeing, even when wearing glasses/contacts?: No Does the patient have difficulty concentrating, remembering, or  making decisions?: No Patient able to express need for assistance with ADLs?: Yes Does the patient have difficulty dressing or bathing?: No Independently performs ADLs?: Yes (appropriate for developmental age) Does the patient have difficulty walking or climbing stairs?: No       Abuse/Neglect Assessment (Assessment to be complete while patient is alone) Abuse/Neglect Assessment Can Be Completed: Yes Physical Abuse: Denies Verbal Abuse: Denies Sexual Abuse: Denies Exploitation of patient/patient's resources: Denies     Merchant navy officerAdvance Directives (For Healthcare) Does Patient Have a Medical Advance Directive?: No Would patient like information on creating a medical advance directive?: No - Patient declined    Additional Information 1:1 In Past 12 Months?: No CIRT Risk: No Elopement Risk: No Does patient have medical clearance?: Yes     Disposition:  Disposition Initial Assessment Completed for this Encounter: Yes Disposition of Patient: Discharge  On Site Evaluation by:   Reviewed with Physician:    Wolfgang PhoenixLevette,Jaydi Bray D 05/08/2018 10:14 AM

## 2019-01-13 ENCOUNTER — Emergency Department (HOSPITAL_COMMUNITY)
Admission: EM | Admit: 2019-01-13 | Discharge: 2019-01-14 | Disposition: A | Discharge status: Other Acute Inpatient Hospital | Payer: Medicaid Other | Attending: Emergency Medicine | Admitting: Emergency Medicine

## 2019-01-13 ENCOUNTER — Encounter (HOSPITAL_COMMUNITY): Payer: Self-pay | Admitting: *Deleted

## 2019-01-13 ENCOUNTER — Other Ambulatory Visit: Payer: Self-pay

## 2019-01-13 DIAGNOSIS — Z79899 Other long term (current) drug therapy: Secondary | ICD-10-CM | POA: Insufficient documentation

## 2019-01-13 DIAGNOSIS — F209 Schizophrenia, unspecified: Secondary | ICD-10-CM | POA: Insufficient documentation

## 2019-01-13 LAB — RAPID URINE DRUG SCREEN, HOSP PERFORMED
Amphetamines: NOT DETECTED
Barbiturates: NOT DETECTED
Benzodiazepines: NOT DETECTED
Cocaine: NOT DETECTED
Opiates: NOT DETECTED
Tetrahydrocannabinol: NOT DETECTED

## 2019-01-13 LAB — COMPREHENSIVE METABOLIC PANEL
ALT: 18 U/L (ref 0–44)
AST: 23 U/L (ref 15–41)
Albumin: 4.4 g/dL (ref 3.5–5.0)
Alkaline Phosphatase: 69 U/L (ref 38–126)
Anion gap: 14 (ref 5–15)
BUN: 15 mg/dL (ref 6–20)
CO2: 22 mmol/L (ref 22–32)
Calcium: 9.9 mg/dL (ref 8.9–10.3)
Chloride: 105 mmol/L (ref 98–111)
Creatinine, Ser: 1.14 mg/dL — ABNORMAL HIGH (ref 0.44–1.00)
GFR calc Af Amer: >60 mL/min (ref >60)
GFR calc non Af Amer: >60 mL/min (ref >60)
Glucose, Bld: 113 mg/dL — ABNORMAL HIGH (ref 70–99)
Potassium: 3.6 mmol/L (ref 3.5–5.1)
Sodium: 141 mmol/L (ref 135–145)
Total Bilirubin: 1.1 mg/dL (ref 0.3–1.2)
Total Protein: 9.4 g/dL — ABNORMAL HIGH (ref 6.5–8.1)

## 2019-01-13 LAB — CBC
HCT: 41.2 % (ref 36.0–46.0)
Hemoglobin: 13.3 g/dL (ref 12.0–15.0)
MCH: 29.8 pg (ref 26.0–34.0)
MCHC: 32.3 g/dL (ref 30.0–36.0)
MCV: 92.2 fL (ref 80.0–100.0)
Platelets: 287 10*3/uL (ref 150–400)
RBC: 4.47 MIL/uL (ref 3.87–5.11)
RDW: 12.3 % (ref 11.5–15.5)
WBC: 8.8 10*3/uL (ref 4.0–10.5)
nRBC: 0 % (ref 0.0–0.2)

## 2019-01-13 LAB — I-STAT BETA HCG BLOOD, ED (MC, WL, AP ONLY): I-stat hCG, quantitative: <5 m[IU]/mL (ref <5)

## 2019-01-13 LAB — VALPROIC ACID LEVEL: Valproic Acid Lvl: <10 ug/mL — ABNORMAL LOW (ref 50.0–100.0)

## 2019-01-13 LAB — ETHANOL: Alcohol, Ethyl (B): <10 mg/dL (ref <10)

## 2019-01-13 MED ORDER — LORAZEPAM 2 MG/ML IJ SOLN
INTRAMUSCULAR | Status: AC
  Filled 2019-01-13: qty 1

## 2019-01-13 MED ORDER — LORAZEPAM 1 MG PO TABS: 1.0000 mg | ORAL_TABLET | ORAL | Status: DC | PRN

## 2019-01-13 MED ORDER — ARIPIPRAZOLE 10 MG PO TABS
10.0000 mg | ORAL_TABLET | ORAL | Status: DC
  Administered 2019-01-13: 12:00:00 10 mg via ORAL
  Filled 2019-01-13 (×3): qty 1

## 2019-01-13 MED ORDER — ZIPRASIDONE MESYLATE 20 MG IM SOLR
10.0000 mg | INTRAMUSCULAR | Status: AC | PRN
  Administered 2019-01-13: 12:00:00 10 mg via INTRAMUSCULAR

## 2019-01-13 MED ORDER — ONDANSETRON HCL 4 MG PO TABS: 4.0000 mg | ORAL_TABLET | Freq: Three times a day (TID) | ORAL | Status: DC | PRN

## 2019-01-13 MED ORDER — OLANZAPINE 5 MG PO TBDP
10.0000 mg | ORAL_TABLET | Freq: Three times a day (TID) | ORAL | Status: DC | PRN
  Filled 2019-01-13: qty 2

## 2019-01-13 MED ORDER — BUPROPION HCL 75 MG PO TABS
75.0000 mg | ORAL_TABLET | Freq: Every morning | ORAL | Status: DC
  Filled 2019-01-13 (×3): qty 1

## 2019-01-13 MED ORDER — STERILE WATER FOR INJECTION IJ SOLN
INTRAMUSCULAR | Status: AC
  Administered 2019-01-13: 12:00:00 1.2 mL
  Filled 2019-01-13: qty 10

## 2019-01-13 MED ORDER — LORAZEPAM 2 MG/ML IJ SOLN
2.0000 mg | Freq: Once | INTRAMUSCULAR | Status: AC
  Administered 2019-01-13: 2 mg via INTRAMUSCULAR

## 2019-01-13 MED ORDER — ZIPRASIDONE MESYLATE 20 MG IM SOLR
20.0000 mg | INTRAMUSCULAR | Status: DC | PRN
  Filled 2019-01-13: qty 20

## 2019-01-13 MED ORDER — HYDROXYZINE HCL 25 MG PO TABS: 25.0000 mg | ORAL_TABLET | Freq: Four times a day (QID) | ORAL | Status: DC | PRN

## 2019-01-13 MED ORDER — ZIPRASIDONE MESYLATE 20 MG IM SOLR
10.0000 mg | Freq: Once | INTRAMUSCULAR | Status: AC
  Administered 2019-01-13: 13:00:00 10 mg via INTRAMUSCULAR

## 2019-01-13 MED ORDER — LORAZEPAM 1 MG PO TABS
1.0000 mg | ORAL_TABLET | ORAL | Status: DC | PRN
  Filled 2019-01-13: qty 1

## 2019-01-13 MED ORDER — DIVALPROEX SODIUM ER 500 MG PO TB24
1000.0000 mg | ORAL_TABLET | Freq: Every day | ORAL | Status: DC
  Filled 2019-01-13: qty 2

## 2019-01-13 MED ORDER — HALOPERIDOL LACTATE 5 MG/ML IJ SOLN
5.0000 mg | Freq: Four times a day (QID) | INTRAMUSCULAR | Status: DC | PRN
  Administered 2019-01-13 – 2019-01-14 (×3): 5 mg via INTRAMUSCULAR
  Filled 2019-01-13 (×3): qty 1

## 2019-01-13 MED ORDER — BENZTROPINE MESYLATE 1 MG PO TABS
0.5000 mg | ORAL_TABLET | Freq: Two times a day (BID) | ORAL | Status: DC
  Administered 2019-01-13: 12:00:00 0.5 mg via ORAL
  Filled 2019-01-13 (×3): qty 1

## 2019-01-13 MED ORDER — TRAZODONE HCL 100 MG PO TABS
100.0000 mg | ORAL_TABLET | Freq: Every day | ORAL | Status: DC
  Filled 2019-01-13: qty 1

## 2019-01-13 MED ORDER — STERILE WATER FOR INJECTION IJ SOLN
INTRAMUSCULAR | Status: AC
  Filled 2019-01-13: qty 10

## 2019-01-13 MED ORDER — ACETAMINOPHEN 325 MG PO TABS: 650.0000 mg | ORAL_TABLET | ORAL | Status: DC | PRN

## 2019-01-13 MED ORDER — OLANZAPINE 5 MG PO TBDP
10.0000 mg | ORAL_TABLET | Freq: Three times a day (TID) | ORAL | Status: DC | PRN
  Filled 2019-01-13 (×2): qty 2
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
## 2019-01-13 NOTE — BH Assessment (Addendum)
Tele Assessment Note   Patient Name: Lindsey Richmond MRN: [id number] Referring Physician: Jaci Carrel, MD Location of Patient: [facility], [id number] Location of Provider: Behavioral Health TTS Department  Lindsey Richmond is an 23 y.o. single female presents unaccompanied to [facility] ED and appears actively psychotic. Per ED note she was transported by a crisis counselor due to [name]'s concern for Pt's wellbeing. Pt has a diagnosis of schizophrenia and receives medication management through [facility]. Pt says she came to the [facility] because "I believe in Jesus." She appears to be responding to internal stimuli and appears to be experiencing thought blocking. She has difficulty answering questions appropriately and is a poor historian. Pt knows her name but did not accurately state her date of birth. She describes her mood as "sluggish." She acknowledges she is not sleeping well. When asked if she was experiencing suicidal thoughts she replied, "kind of" but was unable to elaborate. She acknowledges she is hearing voices but didn't answer when asked if she could describe what she is experiencing. Pt states, "Jesus is real. He's coming back." Pt denies thoughts of wanting to harm others. She denies alcohol or substance use. She is unable to identify any stressors.   TTS contacted [name], Jabrea Gusky [phone]. She reports Pt missed her injectable psychiatric medication at [facility] and has been decompensating. She says Pt has been responding to hallucinations and talking about God and Jesus. She says Pt was agitated tonight and was banging her head against a wall. She says Pt has not been sleeping at all and has been up all night. She says Pt has been eating less. [name] says Pt has not expressed suicidal ideation but has said she "[place]." Mother reports Pt has no history of aggression. She says this is not Pt's baseline behavior.Pt was last psychiatrically hospitalized in  2018 at [facility].  Pt is dressed in hospital scrubs, alert and oriented to person but not time, place or situation. Pt speaks in a soft tone, at low volume and normal pace. Motor behavior appears normal except Pt's lips are slightly moving as if she were talking to someone. Eye contact is fair. Pt's mood is euthymic and affect is blunted. Thought process is tangential and at times irrelevant. Pt appears to be responding to internal stimuli during assessment. Pt says she has been to [facility] before and is willing to go again.   Diagnosis: F20.9 Schizophrenia  Past Medical History: History reviewed. No pertinent past medical history.  History reviewed. No pertinent surgical history.  Family History:  Family History  Problem Relation Age of Onset  . Heart failure Mother   . Hypertension Mother   . Diabetes Father     Social History:  reports that she has never smoked. She has never used smokeless tobacco. She reports that she does not drink alcohol or use drugs.  Additional Social History:  Alcohol / Drug Use Pain Medications: please see mar Prescriptions: please see mar Over the Counter: please see mar History of alcohol / drug use?: No history of alcohol / drug abuse Longest period of sobriety (when/how long): NA  CIWA: CIWA-Ar BP: 121/77 Pulse Rate: (!) 107 COWS:    Allergies: No Known Allergies  Home Medications: (Not in a hospital admission)   OB/GYN Status:  No LMP recorded. (Menstrual status: Other).  General Assessment Data Location of Assessment: [facility] ED TTS Assessment: In system Is this a Tele or Face-to-Face Assessment?: Tele Assessment  Is this an Initial Assessment or a Re-assessment for this encounter?: Initial Assessment Patient Accompanied by:: N/A(Alone) Language Other than English: No Living Arrangements: Other (Comment)(Lives with mother) What gender do you identify as?: Female Marital status: Single Maiden name: Plumb Pregnancy Status: No Living  Arrangements: Parent Can pt return to current living arrangement?: Yes Admission Status: Voluntary Is patient capable of signing voluntary admission?: Yes Referral Source: Self/Family/Friend Insurance type: Self-pay     Crisis Care Plan Living Arrangements: Parent Legal Guardian: Mother, Father Name of Psychiatrist: Transport planner Name of Therapist: None  Education Status Is patient currently in school?: No Is the patient employed, unemployed or receiving disability?: Unemployed  Risk to self with the past 6 months Suicidal Ideation: No Has patient been a risk to self within the past 6 months prior to admission? : No Suicidal Intent: No Has patient had any suicidal intent within the past 6 months prior to admission? : No Is patient at risk for suicide?: No Suicidal Plan?: No Has patient had any suicidal plan within the past 6 months prior to admission? : No Access to Means: No What has been your use of drugs/alcohol within the last 12 months?: None Previous Attempts/Gestures: No How many times?: 0 Other Self Harm Risks: Pt banged head Triggers for Past Attempts: None known Intentional Self Injurious Behavior: Bruising Comment - Self Injurious Behavior: Pt banged head Family Suicide History: Unknown Recent stressful life event(s): Other (Comment)(Pt unable to idenitfy stressor) Persecutory voices/beliefs?: No Depression: No Depression Symptoms: Insomnia, Loss of interest in usual pleasures Substance abuse history and/or treatment for substance abuse?: No Suicide prevention information given to non-admitted patients: Not applicable  Risk to Others within the past 6 months Homicidal Ideation: No Does patient have any lifetime risk of violence toward others beyond the six months prior to admission? : No Thoughts of Harm to Others: No Current Homicidal Intent: No Current Homicidal Plan: No Access to Homicidal Means: No Identified Victim: None History of harm to others?:  No Assessment of Violence: None Noted Violent Behavior Description: None Does patient have access to weapons?: No Criminal Charges Pending?: No Does patient have a court date: No Is patient on probation?: No  Psychosis Hallucinations: Auditory, Visual Delusions: Grandiose  Mental Status Report Appearance/Hygiene: In scrubs Eye Contact: Fair Motor Activity: Unable to assess Speech: Tangential Level of Consciousness: Alert Mood: Euthymic Affect: Blunted Anxiety Level: None Thought Processes: Tangential, Irrelevant Judgement: Impaired Orientation: Person Obsessive Compulsive Thoughts/Behaviors: None  Cognitive Functioning Concentration: Poor Memory: Unable to Assess Is patient IDD: No Insight: Poor Impulse Control: Poor Appetite: Fair Have you had any weight changes? : No Change Sleep: Decreased Total Hours of Sleep: 0 Vegetative Symptoms: None  ADLScreening [facility] Assessment Services) Patient's cognitive ability adequate to safely complete daily activities?: Yes Patient able to express need for assistance with ADLs?: Yes Independently performs ADLs?: Yes (appropriate for developmental age)  Prior Inpatient Therapy Prior Inpatient Therapy: Yes Prior Therapy Dates: 2018, multiple admits Prior Therapy Facilty/Provider(s): [facility] Reason for Treatment: Schizophrenia  Prior Outpatient Therapy Prior Outpatient Therapy: Yes Prior Therapy Dates: Current Prior Therapy Facilty/Provider(s): [facility] Reason for Treatment: Schizophrenia Does patient have an ACCT team?: No Does patient have Intensive In-House Services?  : No Does patient have [facility] services? : Yes Does patient have P4CC services?: No  ADL Screening (condition at time of admission) Patient's cognitive ability adequate to safely complete daily activities?: Yes Is the patient deaf or have difficulty hearing?: No Does the patient have difficulty seeing, even  when wearing glasses/contacts?: No Does the  patient have difficulty concentrating, remembering, or making decisions?: Yes Patient able to express need for assistance with ADLs?: Yes Does the patient have difficulty dressing or bathing?: No Independently performs ADLs?: Yes (appropriate for developmental age) Does the patient have difficulty walking or climbing stairs?: No Weakness of Legs: None Weakness of Arms/Hands: None  Home Assistive Devices/Equipment Home Assistive Devices/Equipment: None    Abuse/Neglect Assessment (Assessment to be complete while patient is alone) Abuse/Neglect Assessment Can Be Completed: Unable to assess, patient is non-responsive or altered mental status     Advance Directives (For Healthcare) Does Patient Have a Medical Advance Directive?: No Would patient like information on creating a medical advance directive?: No - Patient declined          Disposition: Binnie RailJoAnn Glover[facility], confirmed adult acute unit is at capacity. Gave clinical report to Nira ConnJason Berry, FNP who said Pt meets criteria for inpatient psychiatric treatment. TTS will contact facilities for placement. Notified Dr. Jaci Carrelhristopher Pollina and Sydnee Cabalhristine Chrisco, RN of recommendation.  Disposition Initial Assessment Completed for this Encounter: Yes  This service was provided via telemedicine using a 2-way, interactive audio and video technology.  Names of all persons participating in this telemedicine service and their role in this encounter. Name: Kenn FileMaiya Diez Role: Patient  Name: Melburn Popperianna Freiermuth (via telephone) Role: [name]  Name: Shela CommonsFord Tykera Skates Jr, [facility] Role: TTS counselor      Harlin RainFord Ellis Patsy BaltimoreWarrick Jr, [facility], [facility], [facility] Triage Specialist [phone]  Pamalee LeydenWarrick Jr, Najla Aughenbaugh Ellis 01/13/2019 5:40 AM

## 2019-01-13 NOTE — ED Notes (Signed)
Pt being ttsd at present

## 2019-01-13 NOTE — ED Notes (Signed)
Ordered pt's regular dinner tray. 

## 2019-01-13 NOTE — ED Triage Notes (Signed)
The pt arrived from triage  She was dropped off by an act team member ????  Pt being triaged speaking very slowly  Answers questions slowly. Does make eye contact very slowly.  She denies si and hi she knows she is in [place].  Denies ever being treated at a mental health facility  lmp  She denies ever having had a period she is talking about jesus through out other conversations

## 2019-01-13 NOTE — ED Notes (Signed)
Pt in burgundy scrubs and has been wanded by security

## 2019-01-13 NOTE — ED Notes (Signed)
[name] wanted her evaluated for behavioral problems. Pt had a crisis or act type counselor with her. When asked what was going on the counselor said to contact the mother.   Mother's information: Dianna [phone]

## 2019-01-13 NOTE — ED Notes (Addendum)
Pt runs out of run and attempts to leave unit. MD aware. Also aware of HR elevation

## 2019-01-13 NOTE — ED Notes (Signed)
Regular Lunch Tray Ordered 

## 2019-01-13 NOTE — ED Notes (Signed)
Pt awake. Sitting up in bed eating snack from dinner tray: graham crackers. Tea x2,  Some salad. Pt is cooperative at this time, and will continue to monitor pt.

## 2019-01-13 NOTE — ED Provider Notes (Signed)
[facility] EMERGENCY DEPARTMENT Provider Note   CSN: [id number] Arrival date & time: 01/13/19  0437    History   Chief Complaint Chief Complaint  Patient presents with  . Psychiatric Evaluation    HPI Lindsey Richmond is a 23 y.o. female.     Patient with history of schizophrenia comes to the ER from home because of mother's concerns.  Patient uncooperative with history and exam, not providing any information.  Mother was contacted by phone and she provides additional information.  Patient has been agitated tonight, banging her head against the wall.  Mother reports that until recently she has been fairly independent and has been going monthly to get her psychiatric injections but has not gotten one for "a while".  Mother has noticed the patient hearing voices and has had increased religiosity.  She has been talking nonstop about the [employer] and Jesus.  She has set on multiple occasions that she "wants to go to [place]" but has not expressed any active suicidality or plan.  Mother Bernita Buffy) [phone]     No past medical history on file.  Patient Active Problem List   Diagnosis Date Noted  . Schizophrenia, paranoid (HCC) 04/20/2017    No past surgical history on file.   OB History    Gravida  0   Para  0   Term  0   Preterm  0   AB  0   Living  0     SAB  0   TAB  0   Ectopic  0   Multiple  0   Live Births  0            Home Medications    Prior to Admission medications   Medication Sig Start Date End Date Taking? Authorizing Provider  ARIPiprazole (ABILIFY) 10 MG tablet Take 1 tablet (10 mg total) by mouth 2 (two) times daily in the am and at bedtime.. For mood control 05/17/17   Armandina Stammer I, NP  ARIPiprazole ER 400 MG SRER Inject 400 mg into the muscle every 28 (twenty-eight) days. (Due on 06-13-17): For mood control 06/13/17   Armandina Stammer I, NP  benztropine (COGENTIN) 0.5 MG tablet Take 1 tablet (0.5 mg) in the mornings &  at bedtime: For prevention of drug induced tremors. 05/17/17   Armandina Stammer I, NP  buPROPion (WELLBUTRIN) 75 MG tablet Take 1 tablet (75 mg total) by mouth every morning. For depression 05/18/17   Armandina Stammer I, NP  divalproex (DEPAKOTE ER) 500 MG 24 hr tablet Take 2 tablets (1,000 mg total) by mouth at bedtime. For mood stabilization 05/17/17   Armandina Stammer I, NP  hydrOXYzine (ATARAX/VISTARIL) 25 MG tablet Take 1 tablet (25 mg total) by mouth every 6 (six) hours as needed for anxiety. 05/17/17   Armandina Stammer I, NP  traZODone (DESYREL) 100 MG tablet Take 1 tablet (100 mg total) by mouth at bedtime. For insomnia 05/17/17   Sanjuana Kava, NP    Family History Family History  Problem Relation Age of Onset  . Heart failure Mother   . Hypertension Mother   . Diabetes Father     Social History Social History   Tobacco Use  . Smoking status: Never Smoker  . Smokeless tobacco: Never Used  Substance Use Topics  . Alcohol use: No  . Drug use: No     Allergies   Patient has no known allergies.   Review of Systems Review of Systems  Psychiatric/Behavioral: Positive for agitation, behavioral problems and hallucinations.  All other systems reviewed and are negative.    Physical Exam Updated Vital Signs There were no vitals taken for this visit.  Physical Exam Vitals signs and nursing note reviewed.  Constitutional:      General: She is not in acute distress.    Appearance: Normal appearance. She is well-developed.  HENT:     Head: Normocephalic and atraumatic.     Right Ear: Hearing normal.     Left Ear: Hearing normal.     Nose: Nose normal.  Eyes:     Conjunctiva/sclera: Conjunctivae normal.     Pupils: Pupils are equal, round, and reactive to light.  Neck:     Musculoskeletal: Normal range of motion and neck supple.  Cardiovascular:     Rate and Rhythm: Regular rhythm.     Heart sounds: S1 normal and S2 normal. No murmur. No friction rub. No gallop.   Pulmonary:      Effort: Pulmonary effort is normal. No respiratory distress.     Breath sounds: Normal breath sounds.  Chest:     Chest wall: No tenderness.  Abdominal:     General: Bowel sounds are normal.     Palpations: Abdomen is soft.     Tenderness: There is no abdominal tenderness. There is no guarding or rebound. Negative signs include Murphy's sign and McBurney's sign.     Hernia: No hernia is present.  Musculoskeletal: Normal range of motion.  Skin:    General: Skin is warm and dry.     Findings: No rash.  Neurological:     Mental Status: She is alert.     Cranial Nerves: No cranial nerve deficit.     Sensory: No sensory deficit.     Coordination: Coordination normal.  Psychiatric:        Mood and Affect: Mood is depressed. Affect is flat.        Speech: Speech is delayed.        Behavior: Behavior is slowed and withdrawn.      ED Treatments / Results  Labs (all labs ordered are listed, but only abnormal results are displayed) Labs Reviewed - No data to display  EKG None  Radiology No results found.  Procedures Procedures (including critical care time)  Medications Ordered in ED Medications - No data to display   Initial Impression / Assessment and Plan / ED Course  I have reviewed the triage vital signs and the nursing notes.  Pertinent labs & imaging results that were available during my care of the patient were reviewed by me and considered in my medical decision making (see chart for details).        Patient sent to emergency department by her mother for evaluation.  Patient lives with her mother.  Mother is concerned that she has not gotten her psychiatric meds for several months.  Patient has had increased agitation and has started to bang her head against the wall tonight.  She has been expressing hyperreligiosity and mother is concerned about auditory hallucinations.  At arrival, patient would not make any eye contact.  She would not answer any questions  appropriately.  When left alone she sits in the bed, rocks and talks quietly to herself.  Patient will require psychiatric evaluation for further treatment.  Final Clinical Impressions(s) / ED Diagnoses   Final diagnoses:  Schizophrenia, unspecified type [facility])    ED Discharge Orders    None  Gilda CreasePollina, [name] J, MD 01/13/19 [phone]

## 2019-01-13 NOTE — ED Notes (Signed)
Left wrist restraint removed, pt resting quietly. Respirations e/u.

## 2019-01-13 NOTE — ED Notes (Signed)
Bilateral LE restraints removed. Pt resting quietly at this time.

## 2019-01-13 NOTE — ED Notes (Signed)
Pt attempts to slowly movwe from bed. Pt redirected and returns to bed.

## 2019-01-13 NOTE — ED Notes (Signed)
Pt's lunch tray arrived. 

## 2019-01-13 NOTE — ED Notes (Signed)
Bilateral wrist restraints removed. Will continue to monitor.

## 2019-01-13 NOTE — ED Notes (Signed)
Pt standing in front of TV stating "He is my child" in repetative and pressured fashion. Pt redirected back to bed and quiets with conversation.

## 2019-01-13 NOTE — ED Notes (Signed)
Pt alert, becoming increasingly agitated. Wrist restraints reapplied, Dr. Lockie Mola aware, additional PRN meds ordered.

## 2019-01-13 NOTE — ED Notes (Signed)
Wrist restraints applied and Pt becomes quiet with eyes open and no response.

## 2019-01-13 NOTE — ED Notes (Signed)
Pt sat up on the side of the bed. When asked if needed to use bathroom pt stated yes. I Amil Amen NT sitter) walked pt to bathroom. Pt stood in center of bathroom. I encouraged pt to sit on toilette, but she laid down on the floor. RN came in and helped get pt up and walk her back to the room. Pt now laying down in bed.

## 2019-01-13 NOTE — ED Notes (Signed)
Pt gets out of wrist restraints and again begins fighting with staff. Gurney cuff applied x 4.

## 2019-01-13 NOTE — ED Notes (Signed)
Pt repetatively states "I can't" and "I love you." pt appears to be increasing in cycle. PA informed.

## 2019-01-13 NOTE — ED Provider Notes (Signed)
12:19 PM I was called to bedside to evaluate the patient for agitation.  Patient is crying out stating "he is going to kill me", and explaining about rapture.  Patient with clear psychosis.  Discussed with RN administration of as needed meds.  Will reassess after PRN meds given.  1:21 PM Patient continuing to escalate. Will order additional 10mg  Geodon and 2 mg Ativan. Attending Dr. Virgina Norfolk at bedside to evaluate. Will reassess after administration. IVC performed.    Elisha Ponder, PA-C 01/13/19 1535    Virgina Norfolk, DO 01/13/19 [phone]

## 2019-01-14 ENCOUNTER — Other Ambulatory Visit: Payer: Self-pay

## 2019-01-14 ENCOUNTER — Other Ambulatory Visit: Payer: Self-pay | Admitting: Registered Nurse

## 2019-01-14 ENCOUNTER — Inpatient Hospital Stay (HOSPITAL_COMMUNITY)
Admission: AD | Admit: 2019-01-14 | Discharge: 2019-01-17 | DRG: 885 | Disposition: A | Discharge status: Home / Self Care | Payer: Medicaid Other | Attending: Psychiatry | Admitting: Psychiatry

## 2019-01-14 ENCOUNTER — Encounter (HOSPITAL_COMMUNITY): Payer: Self-pay | Admitting: *Deleted

## 2019-01-14 DIAGNOSIS — Z8249 Family history of ischemic heart disease and other diseases of the circulatory system: Secondary | ICD-10-CM

## 2019-01-14 DIAGNOSIS — F200 Paranoid schizophrenia: Secondary | ICD-10-CM | POA: Diagnosis present

## 2019-01-14 DIAGNOSIS — Z9114 Patient's other noncompliance with medication regimen: Secondary | ICD-10-CM | POA: Diagnosis not present

## 2019-01-14 DIAGNOSIS — F329 Major depressive disorder, single episode, unspecified: Secondary | ICD-10-CM | POA: Diagnosis present

## 2019-01-14 DIAGNOSIS — Z79899 Other long term (current) drug therapy: Secondary | ICD-10-CM

## 2019-01-14 DIAGNOSIS — Z833 Family history of diabetes mellitus: Secondary | ICD-10-CM | POA: Diagnosis not present

## 2019-01-14 DIAGNOSIS — F202 Catatonic schizophrenia: Secondary | ICD-10-CM | POA: Diagnosis present

## 2019-01-14 DIAGNOSIS — Z9119 Patient's noncompliance with other medical treatment and regimen: Secondary | ICD-10-CM | POA: Diagnosis not present

## 2019-01-14 DIAGNOSIS — F209 Schizophrenia, unspecified: Secondary | ICD-10-CM | POA: Diagnosis present

## 2019-01-14 MED ORDER — BENZTROPINE MESYLATE 0.5 MG PO TABS
0.5000 mg | ORAL_TABLET | Freq: Two times a day (BID) | ORAL | Status: DC
  Administered 2019-01-14: 0.5 mg via ORAL
  Filled 2019-01-14 (×5): qty 1

## 2019-01-14 MED ORDER — RISPERIDONE 3 MG PO TABS
3.0000 mg | ORAL_TABLET | Freq: Two times a day (BID) | ORAL | Status: DC
  Administered 2019-01-14: 3 mg via ORAL
  Filled 2019-01-14 (×5): qty 1

## 2019-01-14 MED ORDER — HYDROXYZINE HCL 25 MG PO TABS
25.0000 mg | ORAL_TABLET | Freq: Three times a day (TID) | ORAL | Status: DC | PRN
  Filled 2019-01-14 (×2): qty 1

## 2019-01-14 MED ORDER — LORAZEPAM 2 MG/ML IJ SOLN
1.0000 mg | Freq: Once | INTRAMUSCULAR | Status: AC
  Administered 2019-01-14: 04:00:00 1 mg via INTRAMUSCULAR
  Filled 2019-01-14: qty 1

## 2019-01-14 MED ORDER — TRAZODONE HCL 100 MG PO TABS
200.0000 mg | ORAL_TABLET | Freq: Every evening | ORAL | Status: DC | PRN
  Filled 2019-01-14 (×2): qty 2

## 2019-01-14 MED ORDER — ALUM & MAG HYDROXIDE-SIMETH 200-200-20 MG/5ML PO SUSP: 30.0000 mL | ORAL | Status: DC | PRN

## 2019-01-14 MED ORDER — DIVALPROEX SODIUM ER 500 MG PO TB24
1000.0000 mg | ORAL_TABLET | Freq: Every day | ORAL | Status: DC
  Administered 2019-01-14: 21:00:00 1000 mg via ORAL
  Filled 2019-01-14 (×2): qty 2

## 2019-01-14 MED ORDER — ACETAMINOPHEN 325 MG PO TABS: 650.0000 mg | ORAL_TABLET | Freq: Four times a day (QID) | ORAL | Status: DC | PRN

## 2019-01-14 MED ORDER — TEMAZEPAM 15 MG PO CAPS
30.0000 mg | ORAL_CAPSULE | Freq: Every day | ORAL | Status: DC
  Administered 2019-01-14: 21:00:00 30 mg via ORAL
  Filled 2019-01-14: qty 2

## 2019-01-14 MED ORDER — MAGNESIUM HYDROXIDE 400 MG/5ML PO SUSP: 30.0000 mL | Freq: Every day | ORAL | Status: DC | PRN

## 2019-01-14 MED ORDER — ARIPIPRAZOLE ER 400 MG IM SRER
400.0000 mg | Freq: Once | INTRAMUSCULAR | Status: AC
  Administered 2019-01-14: 19:00:00 400 mg via INTRAMUSCULAR
  Filled 2019-01-14 (×2): qty 2

## 2019-01-14 MED ORDER — DIPHENHYDRAMINE HCL 25 MG PO CAPS: 50.0000 mg | ORAL_CAPSULE | Freq: Four times a day (QID) | ORAL | Status: DC | PRN

## 2019-01-14 NOTE — [facility] Group Notes (Signed)
LCSW Group Therapy Note  01/14/2019 11:46 AM  Type of Therapy/Topic: Group Therapy: Feelings about Diagnosis  Participation Level: Did Not Attend   Description of Group:  This group will allow patients to explore their thoughts and feelings about diagnoses they have received. Patients will be guided to explore their level of understanding and acceptance of these diagnoses. Facilitator will encourage patients to process their thoughts and feelings about the reactions of others to their diagnosis and will guide patients in identifying ways to discuss their diagnosis with significant others in their lives. This group will be process-oriented, with patients participating in exploration of their own experiences, giving and receiving support, and processing challenge from other group members.  Therapeutic Goals: 1. Patient will demonstrate understanding of diagnosis as evidenced by identifying two or more symptoms of the disorder 2. Patient will be able to express two feelings regarding the diagnosis 3. Patient will demonstrate their ability to communicate their needs through discussion and/or role play  Summary of Patient Progress:    Therapeutic Modalities:  Cognitive Behavioral Therapy Brief Therapy Feelings Identification   Myrle Dues, MSW, LCSWA Clinical Social Worker           

## 2019-01-14 NOTE — ED Provider Notes (Signed)
Emergency Medicine Observation Re-evaluation Note  Lindsey Richmond is a 23 y.o. female, seen on rounds today.  Pt initially presented to the ED for complaints of Psychiatric Evaluation Currently, the patient is awake, standing in the room starring at the wall.  Physical Exam  BP (!) 120/57 (BP Location: Left Arm)   Pulse 96   Temp 99.1 F (37.3 C) (Oral)   Resp (!) 24   Ht 5\' 3"  (1.6 m)   Wt 90.7 kg   SpO2 96%   BMI 35.43 kg/m  Physical Exam Awake, standing in room, will not communicate. Respirations even and unlabored. ED Course / MDM  EKG:EKG Interpretation  Date/Time:  Monday January 13 2019 09:52:24 EDT Ventricular Rate:  103 PR Interval:  158 QRS Duration: 82 QT Interval:  358 QTC Calculation: 468 R Axis:   32 Text Interpretation:  Sinus tachycardia Nonspecific T wave abnormality Abnormal ECG Confirmed by Virgina Norfolk [phone]) on 01/13/2019 12:12:26 PM    I have reviewed the labs performed to date as well as medications administered while in observation.  Recent changes in the last 24 hours include none. Plan  Current plan is for transfer to behavioral health with plan to arrive at 11AM. Patient is under full IVC at this time.   Jeannie Fend, PA-C 01/14/19 [id number]    Azalia Bilis, MD 01/14/19 1525

## 2019-01-14 NOTE — ED Notes (Signed)
For the last hour, Pt has continuously sat up in bed and has tried to get up. Pt appears to respond to internal stimuli, and does not respond to staff or make eye contact until break in stimuli. Pt repeatedly speaks loudly saying terms such as  " jesus loves me" "Repent" " the world is ending", and ect.Lindsey Richmond staff is able to keep pt in bed. Pt is not aggressive with staff.

## 2019-01-14 NOTE — Tx Team (Signed)
Initial Treatment Plan 01/14/2019 4:11 PM Lindsey Richmond HFW:[id number]    PATIENT STRESSORS: Medication change or noncompliance   PATIENT STRENGTHS: Physical Health Religious Affiliation Supportive family/friends   PATIENT IDENTIFIED PROBLEMS: Medication noncompliance "I stopped taking my medicines".  Alteration in thought process (Pt is disorganized, religiously preoccupied).                   DISCHARGE CRITERIA:  Improved stabilization in mood, thinking, and/or behavior Motivation to continue treatment in a less acute level of care Verbal commitment to aftercare and medication compliance  PRELIMINARY DISCHARGE PLAN:  Outpatient therapy Return to previous living arrangement  PATIENT/FAMILY INVOLVEMENT: This treatment plan has been presented to and reviewed with the patient, Lindsey Richmond.  The patient have been given the opportunity to ask questions and make suggestions.  Sherryl Manges, RN 01/14/2019, 4:11 PM

## 2019-01-14 NOTE — ED Notes (Signed)
Regular Diet was ordered for Lunch. 

## 2019-01-14 NOTE — Progress Notes (Addendum)
Torian appears flat/bizarre in affect and mood. Pt was not able to answer assessment questions at this time. From previous RN, Pt was no able to complete admission paper work at this time-this has not change. Pt woke up and was observe talking to herself; remains slightly sedated with minimal drooling noted. Appears Pt is displaying s/s of catatonia.Pt remains religiously preoccupied stating "God, he loves you". With much encouragement, Pt took scheduled meds HS. Fluids encouraged.HFR ordered and maintained due to unsteady gait. Support offered. Will continue with POC.

## 2019-01-14 NOTE — Progress Notes (Signed)
[place] NOVEL CORONAVIRUS (COVID-19) DAILY CHECK-OFF SYMPTOMS - answer yes or no to each - every day NO YES  Have you had a fever in the past 24 hours?  . Fever (Temp > 37.80C / 100F) X   Have you had any of these symptoms in the past 24 hours? . New Cough .  Sore Throat  .  Shortness of Breath .  Difficulty Breathing .  Unexplained Body Aches   X   Have you had any one of these symptoms in the past 24 hours not related to allergies?   . Runny Nose .  Nasal Congestion .  Sneezing   X   If you have had runny nose, nasal congestion, sneezing in the past 24 hours, has it worsened?  X   EXPOSURES - check yes or no X   Have you traveled outside the state in the past 14 days?  X   Have you been in contact with someone with a confirmed diagnosis of COVID-19 or PUI in the past 14 days without wearing appropriate PPE?  X   Have you been living in the same home as a person with confirmed diagnosis of COVID-19 or a PUI (household contact)?    X   Have you been diagnosed with COVID-19?    X              What to do next: Answered NO to all: Answered YES to anything:   Proceed with unit schedule Follow the [facility] Inpatient Flowsheet.   

## 2019-01-14 NOTE — Progress Notes (Addendum)
Admission DAR Note: Pt ambulatory to unit with assistance. Presents sedated with an unsteady gait. Denies SI, HI, AVH and pain when assessed. Per pt "my mom called the cops on me because I was telling her that Jesus is coming and he's coming for everyone". Reports that she has not been taking her medications as prescribed when asked by Clinical research associate. Denies history of abuse. States she lives with "my parents" and is allowed to return home upon discharge. Skin assessment done and belongings searched per protocol. Pt does have multiple rash "they are pimples" on her face, bilateral breasts and mid back. Pair of shoes and a pair of white socks secured in assigned locker. Pt unable to complete signatures on some admitting documents due to being sedated but was cooperative with admission process. Q 15 minutes safety checks initiated for safety and falls without incident to note at this time. POC implemented for safety and mood stability and patient monitored as such.

## 2019-01-14 NOTE — ED Notes (Signed)
Ordered a regular breakfast--Tauni Sanks 

## 2019-01-14 NOTE — Progress Notes (Signed)
Per Steffanie Rainwater at [facility], Pt accepted to [facility] 507-2.   Accepting: [name], NP Attending: Dr Jeannine Kitten, MD Pt can be transferred to [facility] at 11:00am.  Garner Nash, MSW, LCSW Clinical Social Worker 01/14/2019 9:21 AM

## 2019-01-14 NOTE — [facility] Suicide Risk Assessment (Signed)
[facility] Admission Suicide Risk Assessment   Total Time spent with patient: 45 minutes Principal Problem: Exacerbation and underlying psychotic disorder Diagnosis:  Active Problems:   Paranoid schizophrenia (HCC)  Subjective Data: Readmission for this 23 year old patient known to have a schizophrenic condition, presenting with recent decline in functioning, dangerous behaviors, propelled by an exacerbation in her schizophrenic condition, missing her last long-acting injectable aripiprazole dosing.   Continued Clinical Symptoms:    The "Alcohol Use Disorders Identification Test", Guidelines for Use in Primary Care, Second Edition.  World Science writer [facility]). Score between 0-7:  no or low risk or alcohol related problems. Score between 8-15:  moderate risk of alcohol related problems. Score between 16-19:  high risk of alcohol related problems. Score 20 or above:  warrants further diagnostic evaluation for alcohol dependence and treatment.   CLINICAL FACTORS:   Schizophrenia:   Less than 14 years old Paranoid or undifferentiated type       COGNITIVE FEATURES THAT CONTRIBUTE TO RISK:  Loss of executive function    SUICIDE RISK:   Minimal: No identifiable suicidal ideation.  Patients presenting with no risk factors but with morbid ruminations; may be classified as minimal risk based on the severity of the depressive symptoms  PLAN OF CARE: Admit for stabilization review meds supplement long-acting since this is a restart will need overlap release 14 days  I certify that inpatient services furnished can reasonably be expected to improve the patient's condition.   Malvin Johns, MD 01/14/2019, 11:36 AM

## 2019-01-14 NOTE — Progress Notes (Signed)
[place] NOVEL CORONAVIRUS (COVID-19) DAILY CHECK-OFF SYMPTOMS - answer yes or no to each - every day NO YES  Have you had a fever in the past 24 hours?  . Fever (Temp > 37.80C / 100F) X   Have you had any of these symptoms in the past 24 hours? . New Cough .  Sore Throat  .  Shortness of Breath .  Difficulty Breathing .  Unexplained Body Aches   X   Have you had any one of these symptoms in the past 24 hours not related to allergies?   . Runny Nose .  Nasal Congestion .  Sneezing   X   If you have had runny nose, nasal congestion, sneezing in the past 24 hours, has it worsened?  X   EXPOSURES - check yes or no X   Have you traveled outside the state in the past 14 days?  X   Have you been in contact with someone with a confirmed diagnosis of COVID-19 or PUI in the past 14 days without wearing appropriate PPE?  X   Have you been living in the same home as a person with confirmed diagnosis of COVID-19 or a PUI (household contact)?    X   Have you been diagnosed with COVID-19?    X              What to do next: Answered NO to all: Answered YES to anything: no   Proceed with unit schedule Follow the [facility] Inpatient Flowsheet.

## 2019-01-14 NOTE — ED Notes (Signed)
IVC papers copied and faxed to [facility], copies to Medical Records, original placed in folder for Magistrate and all 3 sets on clipboard.

## 2019-01-14 NOTE — ED Notes (Signed)
Dad called and update given, aware she is going to [facility].  [phone]

## 2019-01-14 NOTE — ED Notes (Signed)
Patient was given [place] [employer] and Sprite for snack.

## 2019-01-14 NOTE — H&P (Addendum)
Psychiatric Admission Assessment Adult  Patient Identification: Lindsey Richmond MRN:  [id number] Date of Evaluation:  01/14/2019 Chief Complaint:  Schizophrenia Principal Diagnosis: Paranoid schizophrenia/acute exacerbation Diagnosis:  Active Problems:   Paranoid schizophrenia (HCC)  History of Present Illness:   This is the second psychiatric admission here for Lindsey Richmond, at least the third overall, she is 23 years of age -and carries a diagnosis of chronic paranoid schizophrenia. She apparently was noncompliant with her last abilify injection at 400 mg IM monthly and has decompensated. Her drug screen is negative for all compounds tested She presented through the emergency department today, 4/13, with hyperreligiosity, thought blocking, disorganized thought and behavior and the acknowledgment that she had auditory hallucinations.  She became combative with staff approximately 1:30 PM yesterday and required restraint-  She was last hospitalized here in July 2018 after being found walking down the street nude, making hyper religious statements, requiring Geodon in the emergency department, and again requiring inpatient stabilization.  She was prescribed Risperdal in the past with success however has been generally stable on the aripiprazole long-acting injectable with supplemental oral aripiprazole however recent noncompliance has led to this decompensation.  Patient is followed by the [facility] system act team  On my evaluation she is very sluggish I can arouse her briefly she is oriented to person place situation but clearly in need of inpatient stabilization  Associated Signs/Symptoms: Depression Symptoms:  insomnia, (Hypo) Manic Symptoms:  Flight of Ideas, Anxiety Symptoms:  Excessive Worry, Psychotic Symptoms:  Delusions, Hallucinations: Auditory PTSD Symptoms: NA Total Time spent with patient: 45 minutes  Past Psychiatric History: 1 prior admission here, followed by an ER evaluation  a month later but no admission followed  Is the patient at risk to self? Yes.    Has the patient been a risk to self in the past 6 months? No.  Has the patient been a risk to self within the distant past? No.  Is the patient a risk to others? No.  Has the patient been a risk to others in the past 6 months? No.  Has the patient been a risk to others within the distant past? No.      Alcohol Screening:   Substance Abuse History in the last 12 months:  No. Consequences of Substance Abuse: Not applicable Previous Psychotropic Medications: Yes  Psychological Evaluations: No  Past Medical History: No past medical history on file. No past surgical history on file. Family History:  Family History  Problem Relation Age of Onset  . Heart failure Mother   . Hypertension Mother   . Diabetes Father    Family Psychiatric  History: Unknown to patient unable to reach mother Tobacco Screening:   Social History:  Social History   Substance and Sexual Activity  Alcohol Use No     Social History   Substance and Sexual Activity  Drug Use No    Additional Social History:                           Allergies:  No Known Allergies Lab Results:  Results for orders placed or performed during the hospital encounter of 01/13/19 (from the past 48 hour(s))  Rapid urine drug screen (hospital performed)     Status: None   Collection Time: 01/13/19  5:03 AM  Result Value Ref Range   Opiates NONE DETECTED NONE DETECTED   Cocaine NONE DETECTED NONE DETECTED   Benzodiazepines NONE DETECTED NONE DETECTED   Amphetamines  NONE DETECTED NONE DETECTED   Tetrahydrocannabinol NONE DETECTED NONE DETECTED   Barbiturates NONE DETECTED NONE DETECTED    Comment: (NOTE) DRUG SCREEN FOR MEDICAL PURPOSES ONLY.  IF CONFIRMATION IS NEEDED FOR ANY PURPOSE, NOTIFY LAB WITHIN 5 DAYS. LOWEST DETECTABLE LIMITS FOR URINE DRUG SCREEN Drug Class                     Cutoff (ng/mL) Amphetamine and metabolites     1000 Barbiturate and metabolites    200 Benzodiazepine                 200 Tricyclics and metabolites     300 Opiates and metabolites        300 Cocaine and metabolites        300 THC                            50 Performed at [facility], [address] [place], [place] [address]   I-Stat beta hCG blood, ED     Status: None   Collection Time: 01/13/19  5:08 AM  Result Value Ref Range   I-stat hCG, quantitative <5.0 <5 mIU/mL   Comment 3            Comment:   GEST. AGE      CONC.  (mIU/mL)   <=1 WEEK        5 - 50     2 WEEKS       50 - 500     3 WEEKS       100 - 10,000     4 WEEKS     1,000 - 30,000        FEMALE AND NON-PREGNANT FEMALE:     LESS THAN 5 mIU/mL   CBC     Status: None   Collection Time: 01/13/19  5:08 AM  Result Value Ref Range   WBC 8.8 4.0 - 10.5 K/uL   RBC 4.47 3.87 - 5.11 MIL/uL   Hemoglobin 13.3 12.0 - 15.0 g/dL   HCT 22.9 79.8 - 92.1 %   MCV 92.2 80.0 - 100.0 fL   MCH 29.8 26.0 - 34.0 pg   MCHC 32.3 30.0 - 36.0 g/dL   RDW 19.4 17.4 - 08.1 %   Platelets 287 150 - 400 K/uL   nRBC 0.0 0.0 - 0.2 %    Comment: Performed at [facility], [address], [place], [place] [address]  Comprehensive metabolic panel     Status: Abnormal   Collection Time: 01/13/19  5:08 AM  Result Value Ref Range   Sodium 141 135 - 145 mmol/L   Potassium 3.6 3.5 - 5.1 mmol/L   Chloride 105 98 - 111 mmol/L   CO2 22 22 - 32 mmol/L   Glucose, Bld 113 (H) 70 - 99 mg/dL   BUN 15 6 - 20 mg/dL   Creatinine, Ser 5.63 (H) 0.44 - 1.00 mg/dL   Calcium 9.9 8.9 - 14.9 mg/dL   Total Protein 9.4 (H) 6.5 - 8.1 g/dL   Albumin 4.4 3.5 - 5.0 g/dL   AST 23 15 - 41 U/L   ALT 18 0 - 44 U/L   Alkaline Phosphatase 69 38 - 126 U/L   Total Bilirubin 1.1 0.3 - 1.2 mg/dL   GFR calc non Af Amer >60 >60 mL/min   GFR calc Af Amer >60 >60 mL/min   Anion  gap 14 5 - 15    Comment: Performed at [facility], [address], [place], [place] [address]  Ethanol     Status: None    Collection Time: 01/13/19  5:08 AM  Result Value Ref Range   Alcohol, Ethyl (B) <10 <10 mg/dL    Comment: (NOTE) Lowest detectable limit for serum alcohol is 10 mg/dL. For medical purposes only. Performed at [facility], [address], [place], [place] [address]   Valproic acid level     Status: Abnormal   Collection Time: 01/13/19  5:08 AM  Result Value Ref Range   Valproic Acid Lvl <10 (L) 50.0 - 100.0 ug/mL    Comment: RESULTS CONFIRMED BY MANUAL DILUTION Performed at [facility], [address], [place], [place] [address]     Blood Alcohol level:  Lab Results  Component Value Date   [facility] <10 01/13/2019   ETH <5 05/05/2017    Metabolic Disorder Labs:  Lab Results  Component Value Date   HGBA1C 4.9 05/09/2017   MPG 93.93 05/09/2017   Lab Results  Component Value Date   PROLACTIN 102.0 (H) 05/09/2017   Lab Results  Component Value Date   CHOL 150 05/09/2017   TRIG 53 05/09/2017   HDL 33 (L) 05/09/2017   CHOLHDL 4.5 05/09/2017   VLDL 11 05/09/2017   LDLCALC 106 (H) 05/09/2017    Current Medications: Current Facility-Administered Medications  Medication Dose Route Frequency Provider Last Rate Last Dose  . acetaminophen (TYLENOL) tablet 650 mg  650 mg Oral Q6H PRN Malvin Johns, MD      . alum & mag hydroxide-simeth (MAALOX/MYLANTA) 200-200-20 MG/5ML suspension 30 mL  30 mL Oral Q4H PRN Malvin Johns, MD      . ARIPiprazole ER (ABILIFY MAINTENA) injection 400 mg  400 mg Intramuscular Once Malvin Johns, MD      . benztropine (COGENTIN) tablet 0.5 mg  0.5 mg Oral BID Malvin Johns, MD      . diphenhydrAMINE (BENADRYL) capsule 50 mg  50 mg Oral Q6H PRN Malvin Johns, MD      . divalproex (DEPAKOTE ER) 24 hr tablet 1,000 mg  1,000 mg Oral QHS Malvin Johns, MD      . hydrOXYzine (ATARAX/VISTARIL) tablet 25 mg  25 mg Oral TID PRN Malvin Johns, MD      . magnesium hydroxide (MILK OF MAGNESIA) suspension 30 mL  30 mL Oral Daily PRN Malvin Johns, MD      .  risperiDONE (RISPERDAL) tablet 3 mg  3 mg Oral BID Malvin Johns, MD      . temazepam (RESTORIL) capsule 30 mg  30 mg Oral QHS Malvin Johns, MD      . traZODone (DESYREL) tablet 200 mg  200 mg Oral QHS PRN Malvin Johns, MD       PTA Medications: Medications Prior to Admission  Medication Sig Dispense Refill Last Dose  . ARIPiprazole (ABILIFY) 10 MG tablet Take 1 tablet (10 mg total) by mouth 2 (two) times daily in the am and at bedtime.. For mood control (Patient not taking: Reported on 01/13/2019) 60 tablet 0 Not Taking at Unknown time  . ARIPiprazole (ABILIFY) 15 MG tablet Take 15 mg by mouth daily.   4/12?  Marland Kitchen ARIPiprazole ER 400 MG SRER Inject 400 mg into the muscle every 28 (twenty-eight) days. (Due on 06-13-17): For mood control 1 each 0 09/17/18??  . benztropine (COGENTIN) 0.5 MG tablet Take 1 tablet (0.5 mg)  in the mornings & at bedtime: For prevention of drug induced tremors. (Patient not taking: Reported on 01/13/2019) 60 tablet 0 Not Taking at Unknown time  . buPROPion (WELLBUTRIN) 75 MG tablet Take 1 tablet (75 mg total) by mouth every morning. For depression (Patient not taking: Reported on 01/13/2019) 30 tablet 0 Not Taking at Unknown time  . divalproex (DEPAKOTE ER) 500 MG 24 hr tablet Take 2 tablets (1,000 mg total) by mouth at bedtime. For mood stabilization (Patient not taking: Reported on 01/13/2019) 60 tablet 0 Not Taking at Unknown time  . hydrOXYzine (ATARAX/VISTARIL) 25 MG tablet Take 1 tablet (25 mg total) by mouth every 6 (six) hours as needed for anxiety. (Patient taking differently: Take 25 mg by mouth 3 (three) times daily as needed (severe agitation/anxiety/panic). ) 60 tablet 0 4/12?  Marland Kitchen. traZODone (DESYREL) 100 MG tablet Take 1 tablet (100 mg total) by mouth at bedtime. For insomnia (Patient taking differently: Take 100 mg by mouth at bedtime as needed for sleep. For insomnia) 30 tablet 0 unknown    Musculoskeletal: Strength & Muscle Tone: within normal limits Gait &  Station: normal Patient leans: N/A  Psychiatric Specialty Exam: Physical Exam last blood pressure 113/61  ROS not head trauma seizure disorders denied  There were no vitals taken for this visit.There is no height or weight on file to calculate BMI.  General Appearance: Casual and Disheveled  Eye Contact:  None  Speech:  Slow and Slurred  Volume:  Decreased  Mood:  Dysphoric  Affect:  Congruent  Thought Process:  Disorganized and Irrelevant  Orientation:  Other:  Person general situation  Thought Content:  Hallucinations: Auditory  Suicidal Thoughts:  No  Homicidal Thoughts:  No  Memory:  Recent;   Poor  Judgement:  Impaired  Insight:  Lacking  Psychomotor Activity:  Decreased  Concentration:  Attention Span: Poor  Recall:  Poor  Fund of Knowledge:  Poor  Language:  Poor  Akathisia:  NA  Handed:  Right  AIMS (if indicated):     Assets:  Leisure Time Physical Health Resilience  ADL's:  Intact  Cognition:  WNL  Sleep:       Treatment Plan Summary: Daily contact with patient to assess and evaluate symptoms and progress in treatment, Medication management and Plan Plan to resume long-acting injectable and supplemental orals patient is very sluggish now I believe things have just caught up with her because it does not show she received medications today  Observation Level/Precautions:  15 minute checks  Laboratory:  UDS  Psychotherapy: Reality based  Medications: Long-acting injectable plus oral supplementation plus sleep aid  Consultations: Not necessary at present  Discharge Concerns: Long-term stability and safety and compliance  Estimated LOS: 5-7  Other:    Axis I-chronic paranoid schizophrenia/acute exacerbation  Axis II-defer   Axis III-no acute medical issues     Physician Treatment Plan for Primary Diagnosis: <principal problem not specified> Long Term Goal(s): Improvement in symptoms so as ready for discharge  Short Term Goals: Ability to maintain  clinical measurements within normal limits will improve  Physician Treatment Plan for Secondary Diagnosis: Active Problems:   Paranoid schizophrenia (HCC)  Long Term Goal(s): Improvement in symptoms so as ready for discharge  Short Term Goals: Ability to identify and develop effective coping behaviors will improve  I certify that inpatient services furnished can reasonably be expected to improve the patient's condition.    [name],Burgundy Matuszak, MD 4/14/202011:47 AM

## 2019-01-15 DIAGNOSIS — F209 Schizophrenia, unspecified: Secondary | ICD-10-CM | POA: Diagnosis present

## 2019-01-15 MED ORDER — RISPERIDONE 3 MG PO TABS
3.0000 mg | ORAL_TABLET | Freq: Two times a day (BID) | ORAL | Status: DC
  Administered 2019-01-15 – 2019-01-17 (×5): 3 mg via ORAL
  Filled 2019-01-15 (×4): qty 1
  Filled 2019-01-15: qty 20
  Filled 2019-01-15: qty 1
  Filled 2019-01-15: qty 20
  Filled 2019-01-15 (×2): qty 1

## 2019-01-15 MED ORDER — BENZTROPINE MESYLATE 0.5 MG PO TABS
0.5000 mg | ORAL_TABLET | Freq: Two times a day (BID) | ORAL | Status: DC
  Administered 2019-01-15 – 2019-01-17 (×5): 0.5 mg via ORAL
  Filled 2019-01-15 (×6): qty 1
  Filled 2019-01-15: qty 20
  Filled 2019-01-15: qty 1
  Filled 2019-01-15: qty 20

## 2019-01-15 MED ORDER — ENOXAPARIN SODIUM 30 MG/0.3ML ~~LOC~~ SOLN
30.0000 mg | SUBCUTANEOUS | Status: DC
  Filled 2019-01-15 (×2): qty 0.3

## 2019-01-15 MED ORDER — LORAZEPAM 0.5 MG PO TABS
0.5000 mg | ORAL_TABLET | Freq: Three times a day (TID) | ORAL | Status: DC
  Administered 2019-01-15 – 2019-01-17 (×8): 0.5 mg via ORAL
  Filled 2019-01-15 (×7): qty 1

## 2019-01-15 NOTE — [facility] Group Notes (Signed)
  [facility] LCSW Group Therapy Note  Date/Time: 01/15/19, 1315  Type of Therapy/Topic:  Group Therapy:  Emotion Regulation  Participation Level:  Did Not Attend   Mood:  Description of Group:    The purpose of this group is to assist patients in learning to regulate negative emotions and experience positive emotions. Patients will be guided to discuss ways in which they have been vulnerable to their negative emotions. These vulnerabilities will be juxtaposed with experiences of positive emotions or situations, and patients challenged to use positive emotions to combat negative ones. Special emphasis will be placed on coping with negative emotions in conflict situations, and patients will process healthy conflict resolution skills.  Therapeutic Goals: 1. Patient will identify two positive emotions or experiences to reflect on in order to balance out negative emotions:  2. Patient will label two or more emotions that they find the most difficult to experience:  3. Patient will be able to demonstrate positive conflict resolution skills through discussion or role plays:   Summary of Patient Progress:       Therapeutic Modalities:   Cognitive Behavioral Therapy Feelings Identification Dialectical Behavioral Therapy  Greg Tiyanna Larcom, LCSW 

## 2019-01-15 NOTE — Progress Notes (Signed)
[place] NOVEL CORONAVIRUS (COVID-19) DAILY CHECK-OFF SYMPTOMS - answer yes or no to each - every day NO YES  Have you had a fever in the past 24 hours?  . Fever (Temp > 37.80C / 100F) X   Have you had any of these symptoms in the past 24 hours? . New Cough .  Sore Throat  .  Shortness of Breath .  Difficulty Breathing .  Unexplained Body Aches   X   Have you had any one of these symptoms in the past 24 hours not related to allergies?   . Runny Nose .  Nasal Congestion .  Sneezing   X   If you have had runny nose, nasal congestion, sneezing in the past 24 hours, has it worsened?  X   EXPOSURES - check yes or no X   Have you traveled outside the state in the past 14 days?  X   Have you been in contact with someone with a confirmed diagnosis of COVID-19 or PUI in the past 14 days without wearing appropriate PPE?  X   Have you been living in the same home as a person with confirmed diagnosis of COVID-19 or a PUI (household contact)?    X   Have you been diagnosed with COVID-19?    X              What to do next: Answered NO to all: Answered YES to anything:   Proceed with unit schedule Follow the [facility] Inpatient Flowsheet.   

## 2019-01-15 NOTE — [facility] Counselor (Signed)
CSW attempted to complete PSA but pt was asleep and unable to participate.  Will try again tomorrow. Garner Nash, MSW, LCSW Clinical Social Worker 01/15/2019 2:54 PM

## 2019-01-15 NOTE — Progress Notes (Signed)
Nursing Progress Note: 7p-7a D: Pt currently presents with a anxious/hypereligious/tangential affect and behavior. Pt states "Are you believers in the lord? Repent and be saved." Not Interacting with the milieu. Pt reports good sleep during the previous night with current medication regimen.  A: Pt not provided with medications per providers orders due to sedative/mental status. Pt observed alert and oriented x4. Pt observed with severe muscle rigidity and had problems staying awake while answering questions during assessment. Pt's labs and vitals were monitored throughout the night. Pt supported emotionally and encouraged to express concerns and questions. Pt educated on medications.  R: Pt's safety ensured with 15 minute and environmental checks. Pt currently denies SI, HI, and VH and endorses AH. Pt verbally contracts to seek staff if SI,HI, or AVH occurs and to consult with staff before acting on any harmful thoughts. Will continue to monitor.

## 2019-01-15 NOTE — [facility] Group Notes (Signed)
[facility] Group Notes:  (Nursing/MHT/Case Management/Adjunct)  Date:  01/15/2019  Time:  4:00 PM  Type of Therapy:  Nurse Education  Participation Level:  Did Not Attend  Lindsey Richmond 01/15/2019, 4:44 PM

## 2019-01-15 NOTE — Progress Notes (Signed)
[facility] MD Progress Note  01/15/2019 8:23 AM Lindsey Richmond  MRN:  [id number] Subjective:    Patient has declined since yesterday she is displaying catatonic signs she was mute for most of the night making one hyper religious statement or mumbling incoherently according to nursing notes, in my exam she is making no eye contact mumbling and incoherent repetitive fashion Not redirectable Appears dehydrated based on observation  Principal Problem: Exacerbation of underlying psychotic disorder Diagnosis: Active Problems:   Paranoid schizophrenia (HCC)   Schizophrenia (HCC)  Total Time spent with patient: 20 minutes  Past Psychiatric History: ext  Past Medical History: History reviewed. No pertinent past medical history. History reviewed. No pertinent surgical history. Family History:  Family History  Problem Relation Age of Onset  . Heart failure Mother   . Hypertension Mother   . Diabetes Father    Family Psychiatric  History: ukn Social History:  Social History   Substance and Sexual Activity  Alcohol Use No     Social History   Substance and Sexual Activity  Drug Use No    Social History   Socioeconomic History  . Marital status: Single    Spouse name: Not on file  . Number of children: Not on file  . Years of education: Not on file  . Highest education level: Not on file  Occupational History  . Not on file  Social Needs  . Financial resource strain: Not on file  . Food insecurity:    Worry: Not on file    Inability: Not on file  . Transportation needs:    Medical: Not on file    Non-medical: Not on file  Tobacco Use  . Smoking status: Never Smoker  . Smokeless tobacco: Never Used  Substance and Sexual Activity  . Alcohol use: No  . Drug use: No  . Sexual activity: Yes    Birth control/protection: Pill  Lifestyle  . Physical activity:    Days per week: Not on file    Minutes per session: Not on file  . Stress: Not on file  Relationships  . Social  connections:    Talks on phone: Not on file    Gets together: Not on file    Attends religious service: Not on file    Active member of club or organization: Not on file    Attends meetings of clubs or organizations: Not on file    Relationship status: Not on file  Other Topics Concern  . Not on file  Social History Narrative  . Not on file   Additional Social History:                         Sleep: Fair  Appetite:  Poor  Current Medications: Current Facility-Administered Medications  Medication Dose Route Frequency Provider Last Rate Last Dose  . acetaminophen (TYLENOL) tablet 650 mg  650 mg Oral Q6H PRN Malvin Johns, MD      . alum & mag hydroxide-simeth (MAALOX/MYLANTA) 200-200-20 MG/5ML suspension 30 mL  30 mL Oral Q4H PRN Malvin Johns, MD      . benztropine (COGENTIN) tablet 0.5 mg  0.5 mg Oral BID Malvin Johns, MD   0.5 mg at 01/14/19 1755  . diphenhydrAMINE (BENADRYL) capsule 50 mg  50 mg Oral Q6H PRN Malvin Johns, MD      . enoxaparin (LOVENOX) injection 30 mg  30 mg Subcutaneous Q24H Malvin Johns, MD      . hydrOXYzine (ATARAX/VISTARIL)  tablet 25 mg  25 mg Oral TID PRN [name], Linton Stolp, MD      . LORazepam (ATIVAN) tablet 0.5 mg  0.5 mg Oral TID [name], Natahsa Marian, MD      . magnesium hydroxide (MILK OF MAGNESIA) suspension 30 mL  30 mL Oral Daily PRN [name], Brodyn Depuy, MD      . temazepam (RESTORIL) capsule 30 mg  30 mg Oral QHS [name], Nile Prisk, MD   30 mg at 01/14/19 2110  . traZODone (DESYREL) tablet 200 mg  200 mg Oral QHS PRN [name], Jola Critzer, MD        Lab Results: No results found for this or any previous visit (from the past 48 hour(s)).  Blood Alcohol level:  Lab Results  Component Value Date   ETH <10 01/13/2019   ETH <5 05/05/2017    Metabolic Disorder Labs: Lab Results  Component Value Date   HGBA1C 4.9 05/09/2017   MPG 93.93 05/09/2017   Lab Results  Component Value Date   PROLACTIN 102.0 (H) 05/09/2017   Lab Results  Component Value Date   CHOL 150  05/09/2017   TRIG 53 05/09/2017   HDL 33 (L) 05/09/2017   CHOLHDL 4.5 05/09/2017   VLDL 11 05/09/2017   LDLCALC 106 (H) 05/09/2017    Physical Findings: AIMS: Facial and Oral Movements Muscles of Facial Expression: None, normal Lips and Perioral Area: None, normal Jaw: Minimal Tongue: Minimal,Extremity Movements Upper (arms, wrists, hands, fingers): None, normal Lower (legs, knees, ankles, toes): None, normal, Trunk Movements Neck, shoulders, hips: None, normal, Overall Severity Severity of abnormal movements (highest score from questions above): Minimal Incapacitation due to abnormal movements: None, normal Patient's awareness of abnormal movements (rate only patient's report): No Awareness, Dental Status Current problems with teeth and/or dentures?: No Does patient usually wear dentures?: No  CIWA:    COWS:     Musculoskeletal: Strength & Muscle Tone: flaccid Gait & Station: unsteady Patient leans: N/A  Psychiatric Specialty Exam: Physical Exam  ROS  Blood pressure 125/73, pulse 90, temperature 98.7 F (37.1 C), resp. rate 16, height 5\' 3"  (1.6 m), weight 90.7 kg, SpO2 100 %.Body mass index is 35.43 kg/m.  General Appearance: Disheveled  Eye Contact:  None  Speech:  Garbled and Slurred  Volume:  Decreased  Mood:  Dysphoric  Affect:  Blunt  Thought Process:  Disorganized  Orientation:  NA  Thought Content:  Delusions  Suicidal Thoughts:  No  Homicidal Thoughts:  No  Memory:  Recent;   Poor  Judgement:  Impaired  Insight:  Lacking  Psychomotor Activity:  Decreased  Concentration:  Concentration: Poor  Recall:  Poor  Fund of Knowledge:  Poor  Language:  Poor  Akathisia:  Negative  Handed:  Right  AIMS (if indicated):     Assets:  Physical Health Resilience  ADL's:  Impaired  Cognition:  Impaired,  Moderate  Sleep:  Number of Hours: 5.75     Treatment Plan Summary: Daily contact with patient to assess and evaluate symptoms and progress in treatment,  Medication management and Plan No EPS no stiffness no involuntary movements but has drifted into a catatonic type presentation from yesterday we will hold medications add lorazepam continue long-acting injectable of course that is in her system continue reality-based therapy push fluids.  [name],Mithra Spano, MD 01/15/2019, 8:23 AM

## 2019-01-15 NOTE — Plan of Care (Signed)
Progress note  D: pt found in bed; compliant with medication administration. Pt is religiously fixated. Pt is in a semi-catatonic state it seems. Pt has been resting most of the day. Pt had no complaints this morning. Pt denies si/hi/ah/vh and verbally agrees to approach staff if these become apparent or before harming herself/others while at [facility]. A: pt provided support and encouragement. Pt given medication per protocol and standing orders. Q42m safety checks implemented and continued.  R: pt safe on the unit. Will continue to monitor.  Pt progressing in the following metrics  Problem: Education: Goal: Knowledge of [place] [facility] Education information/materials will improve Outcome: Progressing Goal: Emotional status will improve Outcome: Progressing Goal: Mental status will improve Outcome: Progressing Goal: Verbalization of understanding the information provided will improve Outcome: Progressing

## 2019-01-15 NOTE — Tx Team (Signed)
Interdisciplinary Treatment and Diagnostic Plan Update  01/15/2019 Time of Session: 0922 [place] MRN: [id number]  Principal Diagnosis: <principal problem not specified>  Secondary Diagnoses: Active Problems:   Paranoid schizophrenia ([place])   Schizophrenia ([place])   Current Medications:  Current Facility-Administered Medications  Medication Dose Route Frequency Provider Last Rate Last Dose  . acetaminophen (TYLENOL) tablet 650 mg  650 mg Oral Q6H PRN Johnn Hai, MD      . alum & mag hydroxide-simeth (MAALOX/MYLANTA) 200-200-20 MG/5ML suspension 30 mL  30 mL Oral Q4H PRN Johnn Hai, MD      . benztropine (COGENTIN) tablet 0.5 mg  0.5 mg Oral BID Johnn Hai, MD   0.5 mg at 01/15/19 1010  . diphenhydrAMINE (BENADRYL) capsule 50 mg  50 mg Oral Q6H PRN Johnn Hai, MD      . hydrOXYzine (ATARAX/VISTARIL) tablet 25 mg  25 mg Oral TID PRN Johnn Hai, MD      . LORazepam (ATIVAN) tablet 0.5 mg  0.5 mg Oral TID Johnn Hai, MD   0.5 mg at 01/15/19 1010  . magnesium hydroxide (MILK OF MAGNESIA) suspension 30 mL  30 mL Oral Daily PRN Johnn Hai, MD      . risperiDONE (RISPERDAL) tablet 3 mg  3 mg Oral BID Johnn Hai, MD   3 mg at 01/15/19 1010  . temazepam (RESTORIL) capsule 30 mg  30 mg Oral QHS Johnn Hai, MD   30 mg at 01/14/19 2110  . traZODone (DESYREL) tablet 200 mg  200 mg Oral QHS PRN Johnn Hai, MD       PTA Medications: Medications Prior to Admission  Medication Sig Dispense Refill Last Dose  . ARIPiprazole (ABILIFY) 10 MG tablet Take 1 tablet (10 mg total) by mouth 2 (two) times daily in the am and at bedtime.. For mood control (Patient not taking: Reported on 01/13/2019) 60 tablet 0 Not Taking at Unknown time  . ARIPiprazole (ABILIFY) 15 MG tablet Take 15 mg by mouth daily.   4/12?  Marland Kitchen ARIPiprazole ER 400 MG SRER Inject 400 mg into the muscle every 28 (twenty-eight) days. (Due on 06-13-17): For mood control 1 each 0 09/17/18??  . benztropine (COGENTIN) 0.5 MG tablet  Take 1 tablet (0.5 mg) in the mornings & at bedtime: For prevention of drug induced tremors. (Patient not taking: Reported on 01/13/2019) 60 tablet 0 Not Taking at Unknown time  . buPROPion (WELLBUTRIN) 75 MG tablet Take 1 tablet (75 mg total) by mouth every morning. For depression (Patient not taking: Reported on 01/13/2019) 30 tablet 0 Not Taking at Unknown time  . divalproex (DEPAKOTE ER) 500 MG 24 hr tablet Take 2 tablets (1,000 mg total) by mouth at bedtime. For mood stabilization (Patient not taking: Reported on 01/13/2019) 60 tablet 0 Not Taking at Unknown time  . hydrOXYzine (ATARAX/VISTARIL) 25 MG tablet Take 1 tablet (25 mg total) by mouth every 6 (six) hours as needed for anxiety. (Patient taking differently: Take 25 mg by mouth 3 (three) times daily as needed (severe agitation/anxiety/panic). ) 60 tablet 0 4/12?  Marland Kitchen traZODone (DESYREL) 100 MG tablet Take 1 tablet (100 mg total) by mouth at bedtime. For insomnia (Patient taking differently: Take 100 mg by mouth at bedtime as needed for sleep. For insomnia) 30 tablet 0 unknown    Patient Stressors: Medication change or noncompliance  Patient Strengths: Physical Health Religious Affiliation Supportive family/friends  Treatment Modalities: Medication Management, Group therapy, Case management,  1 to 1 session with clinician, Psychoeducation, Recreational therapy.  Physician Treatment Plan for Primary Diagnosis: <principal problem not specified> Long Term Goal(s): Improvement in symptoms so as ready for discharge Improvement in symptoms so as ready for discharge   Short Term Goals: Ability to maintain clinical measurements within normal limits will improve Ability to identify and develop effective coping behaviors will improve  Medication Management: Evaluate patient's response, side effects, and tolerance of medication regimen.  Therapeutic Interventions: 1 to 1 sessions, Unit Group sessions and Medication administration.  Evaluation  of Outcomes: Not Met  Physician Treatment Plan for Secondary Diagnosis: Active Problems:   Paranoid schizophrenia ([place])   Schizophrenia ([place])  Long Term Goal(s): Improvement in symptoms so as ready for discharge Improvement in symptoms so as ready for discharge   Short Term Goals: Ability to maintain clinical measurements within normal limits will improve Ability to identify and develop effective coping behaviors will improve     Medication Management: Evaluate patient's response, side effects, and tolerance of medication regimen.  Therapeutic Interventions: 1 to 1 sessions, Unit Group sessions and Medication administration.  Evaluation of Outcomes: Not Met   RN Treatment Plan for Primary Diagnosis: <principal problem not specified> Long Term Goal(s): Knowledge of disease and therapeutic regimen to maintain health will improve  Short Term Goals: Ability to identify and develop effective coping behaviors will improve and Compliance with prescribed medications will improve  Medication Management: RN will administer medications as ordered by provider, will assess and evaluate patient's response and provide education to patient for prescribed medication. RN will report any adverse and/or side effects to prescribing provider.  Therapeutic Interventions: 1 on 1 counseling sessions, Psychoeducation, Medication administration, Evaluate responses to treatment, Monitor vital signs and CBGs as ordered, Perform/monitor CIWA, COWS, AIMS and Fall Risk screenings as ordered, Perform wound care treatments as ordered.  Evaluation of Outcomes: Not Met   LCSW Treatment Plan for Primary Diagnosis: <principal problem not specified> Long Term Goal(s): Safe transition to appropriate next level of care at discharge, Engage patient in therapeutic group addressing interpersonal concerns.  Short Term Goals: Engage patient in aftercare planning with referrals and resources, Increase social support and Increase  skills for wellness and recovery  Therapeutic Interventions: Assess for all discharge needs, 1 to 1 time with Social worker, Explore available resources and support systems, Assess for adequacy in community support network, Educate family and significant other(s) on suicide prevention, Complete Psychosocial Assessment, Interpersonal group therapy.  Evaluation of Outcomes: Not Met   Progress in Treatment: Attending groups: No. Participating in groups: No. Taking medication as prescribed: Yes. Toleration medication: Yes. Family/Significant other contact made: No, will contact:  when given permission Patient understands diagnosis: No. Discussing patient identified problems/goals with staff: No. Medical problems stabilized or resolved: Yes. Denies suicidal/homicidal ideation: Yes. Issues/concerns per patient self-inventory: No. Other: none  New problem(s) identified: No, Describe:  none  New Short Term/Long Term Goal(s):  Patient Goals: "Jesus"   Discharge Plan or Barriers:   Reason for Continuation of Hospitalization: Delusions  Hallucinations Medication stabilization  Estimated Length of Stay: 5-7 days  Attendees: Patient:Lindsey Richmond 01/15/2019   Physician: Dr. Jake Samples, MD 01/15/2019   Nursing: Neldon Newport, RN 01/15/2019   RN Care Manager: 01/15/2019   Social Worker: Lurline Idol, LCSW 01/15/2019   Recreational Therapist:  01/15/2019   Other:  01/15/2019   Other:  01/15/2019   Other: 01/15/2019        Scribe for Treatment Team: Joanne Chars, LCSW 01/15/2019 11:46 AM

## 2019-01-15 NOTE — Progress Notes (Signed)
Pt appears with blank expression; electively mutes with s/s of catotonia noted. Vitals stable this a.m. Respirations even and unlabored. Bilateral dorsalis pedis and radial pulse +2. Will monitor.

## 2019-01-16 NOTE — [facility] Suicide Risk Assessment (Signed)
[facility] INPATIENT:  Family/Significant Other Suicide Prevention Education  Suicide Prevention Education:  Education Completed; Ziqi Reimer, father, [phone], has been identified by the patient as the family member/significant other with whom the patient will be residing, and identified as the person(s) who will aid the patient in the event of a mental health crisis (suicidal ideations/suicide attempt).  With written consent from the patient, the family member/significant other has been provided the following suicide prevention education, prior to the and/or following the discharge of the patient.  The suicide prevention education provided includes the following:  Suicide risk factors  Suicide prevention and interventions  National Suicide Hotline telephone number  [facility] assessment telephone number  [facility] Emergency Assistance 911  [facility] and/or Residential Mobile Crisis Unit telephone number  Request made of family/significant other to:  Remove weapons (e.g., guns, rifles, knives), all items previously/currently identified as safety concern.  No guns in the home.  Remove drugs/medications (over-the-counter, prescriptions, illicit drugs), all items previously/currently identified as a safety concern.  The family member/significant other verbalizes understanding of the suicide prevention education information provided.  The family member/significant other agrees to remove the items of safety concern listed above.  Father is on the road as a Naval architect, will be back in [place] tomorrow.  He and pt mother are separated, pt lives with mother.  He still sees pt when he is in [place].  Pt has had several previous episodes.  Mother used to take her to her [employer] appts and now is not.  Injections have worked well.  Pt is a [place], but she had become much more religious, "like a Production designer, theatre/television/film" and this was not normal for her.  Pt is student at [employer] and  doing pretty well when she is on her medicine.  Lorri Frederick, LCSW 01/16/2019, 2:31 PM

## 2019-01-16 NOTE — Progress Notes (Signed)
Improvements concerning muscle rigidity. Pt was able to walk down unassisted to get vitals this morning and voices ability to go to breakfast. Pt advised to wear non-slip footwear and communicate with staff if feeling weakness. Will continue to monitor.

## 2019-01-16 NOTE — [facility] Group Notes (Signed)
[facility] Group Notes:  (Nursing/MHT/Case Management/Adjunct)  Date:  01/16/2019  Time:  4:26 PM  Type of Therapy:  Psychoeducational Skills    Lindsey Richmond 01/16/2019, 4:26 PM

## 2019-01-16 NOTE — [facility] Counselor (Signed)
Adult Comprehensive Assessment  Patient ID: [name], female   DOB: 22-Jan-1996, 23 y.o.   MRN: [id number]  Information Source: Information source: Patient  Current Stressors:  Patient states their primary concerns and needs for treatment are:: "being nice, being openminded" Patient states their goals for this hospitilization and ongoing recovery are:: "get right with Jesus" Educational / Learning stressors: Pt reports concern over her relationship with Jesus. Family Relationships: Pt reports she is having some trouble getting along with her mother.  Pt said "I don't communicate enough"  Living/Environment/Situation:  Living Arrangements: BothParent Living conditions (as described by patient or guardian): loving home; lives with mother, father How long has patient lived in current situation?: all her life.  What is atmosphere in current home: Comfortable, Loving, Supportive  Family History:  Marital status: Single Are you sexually active?: No What is your sexual orientation?: heterosexual Has your sexual activity been affected by drugs, alcohol, medication, or emotional stress?: n/a  Does patient have children?: No  Childhood History:  By whom was/is the patient raised?: Both parents Additional childhood history information: born in [place].  Description of patient's relationship with caregiver when they were a child: close to both parents; mother [place]; father from [place];  Patient's description of current relationship with people who raised him/her: gets along better with father than mother.  Father is truck driver often on the road.  How were you disciplined when you got in trouble as a child/adolescent?: n/a  Does patient have siblings?: Yes Number of Siblings: 1 Description of patient's current relationship with siblings: close to her brother  Did patient suffer any verbal/emotional/physical/sexual abuse as a child?: No Did patient suffer from severe childhood  neglect?: No Has patient ever been sexually abused/assaulted/raped as an adolescent or adult?: No Was the patient ever a victim of a crime or a disaster?: No Witnessed domestic violence?: No Has patient been effected by domestic violence as an adult?: No No update to trauma history: 01/16/19.  Education:  Highest grade of school patient has completed: some college Currently a student?: No   Employment/Work Situation:  Employment situation: unemployed Patient's job has been impacted by current illness: NA What is the longest time patient has a held a job?: never worked  Where was the patient employed at that time?: never worked  Has patient ever been in the [employer] and Companymilitary?: No Has patient ever served in combat?: No Did You Receive Any Psychiatric Treatment/Services While in Equities traderthe Military?: No Are There Guns or Other Weapons in Your Home?: None reported.   Financial Resources:  Financial resources: Support from parents / caregiver Does patient have a Lawyerrepresentative payee or guardian?: No  Alcohol/Substance Abuse:  What has been your use of drugs/alcohol within the last 12 months?: alcohol: pt denies, drugs: pt denies If attempted suicide, did drugs/alcohol play a role in this?: No Alcohol/Substance Abuse Treatment Hx: Denies past history If yes, describe treatment:  Has alcohol/substance abuse ever caused legal problems?: No  Social Support System: Conservation officer, natureatient's Community Support System: Fair Museum/gallery exhibitions officerDescribe Community Support System: dad, brother, Jesus Type of faith/religion: Ephriam KnucklesChristian How does patient's faith help to cope with current illness?: "Jesus supports me."  Leisure/Recreation:  Leisure and Hobbies: reading, jogging, and walking in the neighborhood  Strengths/Needs:   What is the patient's perception of their strengths?: "funny, detail oriented, good listener" Patient states they can use these personal strengths during their treatment to contribute to their recovery: Pt  unable to answer this question.  Patient states these barriers may affect/interfere  with their treatment: none Patient states these barriers may affect their return to the community: none Other important information patient would like considered in planning for their treatment: none  Discharge Plan:   Currently receiving community mental health services: Yes (From Whom)([facility]) Patient states concerns and preferences for aftercare planning are: Will continue with [facility]: meds and [facility] for therapy.  Patient states they will know when they are safe and ready for discharge when: "when I get myself together" Does patient have access to transportation?: Yes Does patient have financial barriers related to discharge medications?: Yes Patient description of barriers related to discharge medications: no insurance Will patient be returning to same living situation after discharge?: Yes  Summary/Recommendations:   Summary and Recommendations (to be completed by the evaluator): Pt is 23 year old female from [place].  Pt is diagnosed with schizophrenia and was admitted under IVC due to hallucinations and disorganized behaviors.  Recommendations for pt include crisis stabilization, therapeutic milieu, attend and participate in groups, medication management, and development of comprehensive mental wellness plan.   Lorri Frederick. 01/16/2019

## 2019-01-16 NOTE — Progress Notes (Signed)
[facility]  01/16/2019 9:22 AM [name]  MRN:  [id number] Subjective:    Patient is finally more conversant though she lapses into catatonic symptomatology as recently as last night.  On my interview she is alert and oriented to person place and general situation thinks the day as "Wednesday or Thursday" and she is focused on discharge she denies current auditory or visual hallucinations and denies thoughts of harming herself or others seems to understand all my questions is flat and a little constricted some poverty of content but overall showing some improvement finally  Principal Problem: Exacerbation and schizophrenic disorder Diagnosis: Active Problems:   Paranoid schizophrenia ([facility])   Schizophrenia ([facility])  Total Time spent with patient: 20 minutes  Past Medical History: History reviewed. No pertinent past medical history. History reviewed. No pertinent surgical history. Family History:  Family History  Problem Relation Age of Onset  . Heart failure Mother   . Hypertension Mother   . Diabetes Father    Social History:  Social History   Substance and Sexual Activity  Alcohol Use No     Social History   Substance and Sexual Activity  Drug Use No    Social History   Socioeconomic History  . Marital status: Single    Spouse name: Not on file  . Number of children: Not on file  . Years of education: Not on file  . Highest education level: Not on file  Occupational History  . Not on file  Social Needs  . Financial resource strain: Not on file  . Food insecurity:    Worry: Not on file    Inability: Not on file  . Transportation needs:    Medical: Not on file    Non-medical: Not on file  Tobacco Use  . Smoking status: Never Smoker  . Smokeless tobacco: Never Used  Substance and Sexual Activity  . Alcohol use: No  . Drug use: No  . Sexual activity: Yes    Birth control/protection: Pill  Lifestyle  . Physical activity:    Days per week: Not on file     Minutes per session: Not on file  . Stress: Not on file  Relationships  . Social connections:    Talks on phone: Not on file    Gets together: Not on file    Attends religious service: Not on file    Active member of club or organization: Not on file    Attends meetings of clubs or organizations: Not on file    Relationship status: Not on file  Other Topics Concern  . Not on file  Social History Narrative  . Not on file   Additional Social History:                         Sleep: Fair  Appetite:  Fair  Current Medications: Current Facility-Administered Medications  Medication Dose Route Frequency Provider Last Rate Last Dose  . acetaminophen (TYLENOL) tablet 650 mg  650 mg Oral Q6H PRN [name], Shelia Kingsberry, MD      . alum & mag hydroxide-simeth (MAALOX/MYLANTA) 200-200-20 MG/5ML suspension 30 mL  30 mL Oral Q4H PRN [name], Chaynce Schafer, MD      . benztropine (COGENTIN) tablet 0.5 mg  0.5 mg Oral BID [name], Nakina Spatz, MD   0.5 mg at 01/16/19 0816  . diphenhydrAMINE (BENADRYL) capsule 50 mg  50 mg Oral Q6H PRN [name], Mayford Alberg, MD      . hydrOXYzine (ATARAX/VISTARIL)  tablet 25 mg  25 mg Oral TID PRN Malvin Johns, MD      . LORazepam (ATIVAN) tablet 0.5 mg  0.5 mg Oral TID Malvin Johns, MD   0.5 mg at 01/16/19 0816  . magnesium hydroxide (MILK OF MAGNESIA) suspension 30 mL  30 mL Oral Daily PRN Malvin Johns, MD      . risperiDONE (RISPERDAL) tablet 3 mg  3 mg Oral BID Malvin Johns, MD   3 mg at 01/16/19 0816  . temazepam (RESTORIL) capsule 30 mg  30 mg Oral QHS Malvin Johns, MD   30 mg at 01/14/19 2110  . traZODone (DESYREL) tablet 200 mg  200 mg Oral QHS PRN Malvin Johns, MD        Lab Results: No results found for this or any previous visit (from the past 48 hour(s)).  Blood Alcohol level:  Lab Results  Component Value Date   ETH <10 01/13/2019   ETH <5 05/05/2017    Metabolic Disorder Labs: Lab Results  Component Value Date   HGBA1C 4.9 05/09/2017   MPG 93.93 05/09/2017   Lab  Results  Component Value Date   PROLACTIN 102.0 (H) 05/09/2017   Lab Results  Component Value Date   CHOL 150 05/09/2017   TRIG 53 05/09/2017   HDL 33 (L) 05/09/2017   CHOLHDL 4.5 05/09/2017   VLDL 11 05/09/2017   LDLCALC 106 (H) 05/09/2017    Physical Findings: AIMS: Facial and Oral Movements Muscles of Facial Expression: None, normal Lips and Perioral Area: None, normal Jaw: Minimal Tongue: Minimal,Extremity Movements Upper (arms, wrists, hands, fingers): None, normal Lower (legs, knees, ankles, toes): None, normal, Trunk Movements Neck, shoulders, hips: None, normal, Overall Severity Severity of abnormal movements (highest score from questions above): Minimal Incapacitation due to abnormal movements: None, normal Patient's awareness of abnormal movements (rate only patient's report): No Awareness, Dental Status Current problems with teeth and/or dentures?: No Does patient usually wear dentures?: No  CIWA:    COWS:     Musculoskeletal: Strength & Muscle Tone: within normal limits Gait & Station: normal Patient leans: N/A  Psychiatric Specialty Exam: Physical Exam  ROS  Blood pressure 125/73, pulse 90, temperature 98.7 F (37.1 C), resp. rate 16, height 5\' 3"  (1.6 m), weight 90.7 kg, SpO2 100 %.Body mass index is 35.43 kg/m.  General Appearance: Neat  Eye Contact:  Minimal  Speech:  Clear and Coherent and Slow  Volume:  Decreased  Mood:  Dysphoric  Affect:  Flat  Thought Process:  Linear  Orientation:  Full (Time, Place, and Person)  Thought Content:  Poverty of content  Suicidal Thoughts:  No  Homicidal Thoughts:  No  Memory:  Immediate;   Fair  Judgement:  Fair  Insight:  Fair  Psychomotor Activity:  Normal at intervals decreased at others  Concentration:  Concentration: Fair  Recall:  [employer] of Knowledge:  Fair  Language:  Fair  Akathisia:  Negative  Handed:  Right  AIMS (if indicated):     Assets:  Resilience Social Support  ADL's:  Intact   Cognition:  WNL  Sleep:  Number of Hours: 5     Treatment Plan Summary: Daily contact with patient to assess and evaluate symptoms and progress in treatment and Medication management clarify meds will need long-acting continue reality-based therapy cognitive therapy  Annelie Boak, MD 01/16/2019, 9:22 AM

## 2019-01-16 NOTE — [facility] Group Notes (Signed)
[facility] LCSW Group Therapy Note  Date/Time: 01/16/19, 1315  Type of Therapy/Topic:  Group Therapy:  Balance in Life  Participation Level:  Did not attend  Description of Group:    This group will address the concept of balance and how it feels and looks when one is unbalanced. Patients will be encouraged to process areas in their lives that are out of balance, and identify reasons for remaining unbalanced. Facilitators will guide patients utilizing problem- solving interventions to address and correct the stressor making their life unbalanced. Understanding and applying boundaries will be explored and addressed for obtaining  and maintaining a balanced life. Patients will be encouraged to explore ways to assertively make their unbalanced needs known to significant others in their lives, using other group members and facilitator for support and feedback.  Therapeutic Goals: 1. Patient will identify two or more emotions or situations they have that consume much of in their lives. 2. Patient will identify signs/triggers that life has become out of balance:  3. Patient will identify two ways to set boundaries in order to achieve balance in their lives:  4. Patient will demonstrate ability to communicate their needs through discussion and/or role plays  Summary of Patient Progress:          Therapeutic Modalities:   Cognitive Behavioral Therapy Solution-Focused Therapy Assertiveness Training  Greg Malkie Wille, LCSW 

## 2019-01-16 NOTE — Progress Notes (Signed)
DAR NOTE: Patient presents with flat affect and depressed mood.  Denies suicidal ideation, pain, auditory and visual hallucinations.  Rates depression at 0, hopelessness at 0, and anxiety at 0.  Maintained on routine safety checks.  Medications given as prescribed.  Support and encouragement offered as needed.  Attended group and participated.  Patient remained in his room for majority of this shift.  Offered no complaint. Patient is safe on the unit.

## 2019-01-17 MED ORDER — TRAZODONE HCL 100 MG PO TABS: 200.0000 mg | ORAL_TABLET | Freq: Every day | ORAL | 2 refills | Status: DC

## 2019-01-17 MED ORDER — RISPERIDONE 3 MG PO TABS
6.0000 mg | ORAL_TABLET | Freq: Every day | ORAL | Status: DC
  Filled 2019-01-17: qty 20

## 2019-01-17 MED ORDER — LORAZEPAM 0.5 MG PO TABS: 0.5000 mg | ORAL_TABLET | Freq: Three times a day (TID) | ORAL | 0 refills | Status: DC

## 2019-01-17 MED ORDER — ARIPIPRAZOLE ER 400 MG IM SRER: 400.0000 mg | INTRAMUSCULAR | 11 refills | Status: DC

## 2019-01-17 MED ORDER — BENZTROPINE MESYLATE 0.5 MG PO TABS: 0.5000 mg | ORAL_TABLET | Freq: Two times a day (BID) | ORAL | 2 refills | Status: DC

## 2019-01-17 MED ORDER — RISPERIDONE 3 MG PO TABS: 6.0000 mg | ORAL_TABLET | Freq: Every day | ORAL | 2 refills | Status: DC

## 2019-01-17 NOTE — Progress Notes (Signed)
SPIRITUAL CARE GROUP    spiritual care group facilitated by chaplain Burnis Kingfisher, MDiv, [facility]    Group focused on topic of Hope.  Pts engaged in facilitated dialog around meaning of hope.  Participated in visual explorer exercise to define hope for themselves today.   Pt invited Pt did not attend

## 2019-01-17 NOTE — Progress Notes (Signed)
D: Patient observed resting in bed all evening. Did awake around 2230 though was still quite sedated. Patient states, "I'm doing fine. I'm ready to discharge. Do you know when I might be leaving?" Patient's affect flat, sleepy, mood neutral. Denies pain, physical complaints. COVID-19 screening negative, pt afebrile.  A: HS restoril offered but patient declined. Medication education provided. Level III obs in place for safety. Emotional support offered.  Fall prevention plan in place and reviewed with patient as pt is a high fall risk.   R: Patient verbalizes understanding of POC, falls prevention education. Patient denies SI/HI/AVH. As this writer turned to leave room patient stated, "You need to repent now. Jesus Lorie Apley is coming. Coming for all of [place]." Patient remains safe on level III obs. Will continue to monitor throughout the night.

## 2019-01-17 NOTE — Plan of Care (Signed)
Progress note  Patient verbalizes readiness for discharge. Follow up plan explained, AVS, Transition record and SRA given. Prescriptions and teaching provided. Belongings returned and signed for. Suicide safety plan completed and signed. Patient verbalizes understanding. Patient denies SI/HI and assures this writer she will seek assistance should that change. Patient discharged to lobby where mother was waiting.  Problem: Education: Goal: Knowledge of [facility] Education information/materials will improve Outcome: Adequate for Discharge Goal: Emotional status will improve Outcome: Adequate for Discharge Goal: Mental status will improve Outcome: Adequate for Discharge Goal: Verbalization of understanding the information provided will improve Outcome: Adequate for Discharge   Problem: Health Behavior/Discharge Planning: Goal: Identification of resources available to assist in meeting health care needs will improve Outcome: Adequate for Discharge Goal: Compliance with treatment plan for underlying cause of condition will improve Outcome: Adequate for Discharge   Problem: Safety: Goal: Periods of time without injury will increase Outcome: Adequate for Discharge   Problem: Activity: Goal: Will verbalize the importance of balancing activity with adequate rest periods Outcome: Adequate for Discharge   Problem: Education: Goal: Will be free of psychotic symptoms Outcome: Adequate for Discharge Goal: Knowledge of the prescribed therapeutic regimen will improve Outcome: Adequate for Discharge   Problem: Coping: Goal: Coping ability will improve Outcome: Adequate for Discharge Goal: Will verbalize feelings Outcome: Adequate for Discharge   Problem: Nutritional: Goal: Ability to achieve adequate nutritional intake will improve Outcome: Adequate for Discharge   Problem: Role Relationship: Goal: Ability to communicate needs accurately will improve Outcome: Adequate for  Discharge Goal: Ability to interact with others will improve Outcome: Adequate for Discharge   Problem: Safety: Goal: Ability to redirect hostility and anger into socially appropriate behaviors will improve Outcome: Adequate for Discharge Goal: Ability to remain free from injury will improve Outcome: Adequate for Discharge   Problem: Self-Care: Goal: Ability to participate in self-care as condition permits will improve Outcome: Adequate for Discharge   Problem: Self-Concept: Goal: Will verbalize positive feelings about self Outcome: Adequate for Discharge

## 2019-01-17 NOTE — Progress Notes (Signed)
  [facility] Adult Case Management Discharge Plan :  Will you be returning to the same living situation after discharge:  Yes,  home At discharge, do you have transportation home?: Yes,  mom is picking up at 11:00am Do you have the ability to pay for your medications: No. Referred to [facility].  Release of information consent forms completed and in the chart. Patient to Follow up at: Follow-up Information    [facility] Follow up on 01/30/2019.   Why:  Hospital follow up appointment is Monday, 4/20 at 1:30p.   The appointment will be held over the telephone. The provider will call the patient.  Contact information: [address] [place] [address] [phone]           Next level of care provider has access to [facility] Link:yes  Safety Planning and Suicide Prevention discussed: Yes,  with father  Have you used any form of tobacco in the last 30 days? (Cigarettes, Smokeless Tobacco, Cigars, and/or Pipes): No  Has patient been referred to the Quitline?: N/A patient is not a smoker  Patient has been referred for addiction treatment: Yes  Darreld Mclean, LCSWA 01/17/2019, 10:17 AM

## 2019-01-17 NOTE — [facility] Suicide Risk Assessment (Signed)
[facility] Discharge Suicide Risk Assessment   Principal Problem: Exacerbation of underlying psychotic disorder due to noncompliance with long-acting injectable Discharge Diagnoses: Active Problems:   Paranoid schizophrenia (HCC)   Schizophrenia (HCC)   Total Time spent with patient: 45 minutes  Currently alert oriented pleasant cooperative no thoughts of harming self aware of everything except the exact date contracting fully seems to have recalibrated to her baseline status Mental Status Per Nursing Assessment::   On Admission:  NA  Demographic Factors:  Low socioeconomic status  Loss Factors: Decrease in vocational status  Historical Factors: NA  Risk Reduction Factors:   NA  Continued Clinical Symptoms:  Schizophrenia:   Less than 21 years old  Cognitive Features That Contribute To Risk:  Loss of executive function    Suicide Risk:  Minimal: No identifiable suicidal ideation.  Patients presenting with no risk factors but with morbid ruminations; may be classified as minimal risk based on the severity of the depressive symptoms  Follow-up Information    [facility] Follow up on 01/30/2019.   Why:  Hospital follow up appointment with Marcial Pacas is Thursday, 4/30 at 2:45p.  The appointment will be held over the telephone. Marcial Pacas will call the patient.  Contact information: [address] [place] [address] [phone]           Plan Of Care/Follow-up recommendations:  Activity:  full  Naithan Delage, MD 01/17/2019, 9:30 AM

## 2019-01-17 NOTE — Discharge Summary (Signed)
Physician Discharge Summary Note  Patient:  Lindsey Richmond is an 23 y.o., female MRN:  [id number] DOB:  1996/08/09 Patient phone:  [phone] (home)  Patient address:   [address] [place] [address],  Total Time spent with patient: 15 minutes  Date of Admission:  01/14/2019 Date of Discharge: 01/17/19  Reason for Admission:  Psychotic behaviors  Principal Problem: Schizophrenia [facility]) Discharge Diagnoses: Principal Problem:   Schizophrenia (HCC) Active Problems:   Paranoid schizophrenia (HCC)   Past Psychiatric History: History of schizophrenia with multiple prior hospitalizations, most recently at [facility] in 2018. Current patient at [facility].  Past Medical History: History reviewed. No pertinent past medical history. History reviewed. No pertinent surgical history. Family History:  Family History  Problem Relation Age of Onset  . Heart failure Mother   . Hypertension Mother   . Diabetes Father    Family Psychiatric  History: Unspecified mental illness in grandmother and aunt. Social History:  Social History   Substance and Sexual Activity  Alcohol Use No     Social History   Substance and Sexual Activity  Drug Use No    Social History   Socioeconomic History  . Marital status: Single    Spouse name: Not on file  . Number of children: Not on file  . Years of education: Not on file  . Highest education level: Not on file  Occupational History  . Not on file  Social Needs  . Financial resource strain: Not on file  . Food insecurity:    Worry: Not on file    Inability: Not on file  . Transportation needs:    Medical: Not on file    Non-medical: Not on file  Tobacco Use  . Smoking status: Never Smoker  . Smokeless tobacco: Never Used  Substance and Sexual Activity  . Alcohol use: No  . Drug use: No  . Sexual activity: Yes    Birth control/protection: Pill  Lifestyle  . Physical activity:    Days per week: Not on file    Minutes per session: Not  on file  . Stress: Not on file  Relationships  . Social connections:    Talks on phone: Not on file    Gets together: Not on file    Attends religious service: Not on file    Active member of club or organization: Not on file    Attends meetings of clubs or organizations: Not on file    Relationship status: Not on file  Other Topics Concern  . Not on file  Social History Narrative  . Not on file    Hospital Course:  From admission assessment: Lindsey Richmond is an 23 y.o. single female presents unaccompanied to [facility] ED and appears actively psychotic. Per ED note she was transported by a crisis counselor due to [name]'s concern for Pt's wellbeing. Pt has a diagnosis of schizophrenia and receives medication management through [facility]. Pt says she came [facility] the [facility] because "I believe in Jesus." She appears to be responding to internal stimuli and appears to be experiencing thought blocking. She has difficulty answering questions appropriately and is a poor historian. Pt knows her name but did not accurately state her date of birth. She describes her mood as "sluggish." She acknowledges she is not sleeping well. When asked if she was experiencing suicidal thoughts she replied, "kind of" but was unable to elaborate. She acknowledges she is hearing voices but didn't answer when asked if she could describe what  she is experiencing. Pt states, "Jesus is real. He's coming back." Pt denies thoughts of wanting to harm others. She denies alcohol or substance use. She is unable to identify any stressors. TTS contacted [name], Lindsey Richmond [phone]. She reports Pt missed her inject[facility] and has been decompensating. She says Pt has been responding to hallucinations and talking about God and Jesus. She says Pt was agitated tonight and was banging her head against a wall. She says Pt has not been sleeping at all and has been up all night. She says Pt has been eating  less. [name] says Pt has not expressed suicidal ideation but has said she "wanted to go to [place]." Mother reports Pt has no history of aggression. She says this is not Pt's baseline behavior.Pt was last psychiatrically hospitalized in [facility] at [facility].  From admission H&P: This is the second psychiatric admission here for Lindsey Richmond, at least the third overall, she is 23 years of age -and carries a diagnosis of chronic paranoid schizophrenia. She apparently was noncompliant with her last abilify injection at 400 mg IM monthly and has decompensated. Her drug screen is negative for all compounds tested. She presented through the emergency department today, 4/13, with hyperreligiosity, thought blocking, disorganized thought and behavior and the acknowledgment that she had auditory hallucinations. She became combative with staff approximately 1:30 PM yesterday and required restraint. She was last hospitalized here in July 2018 after being found walking down the street nude, making hyper religious statements, requiring Geodon in the emergency department, and again requiring inpatient stabilization.  She was prescribed Risperdal in the past with success however has been generally stable on the aripiprazole long-acting injectable with supplemental oral aripiprazole however recent noncompliance has led to this decompensation. [facility]. On my evaluation she is very sluggish I can arouse her briefly she is oriented to person place situation but clearly in need of inpatient stabilization.  Lindsey Richmond was admitted for psychotic behaviors after missing her scheduled long acting Abilify injection. She was agitated and aggressive with ED staff and required restraint multiple times in the ED. Abilify Maintena 400 mg was given 01/14/19, and Risperdal 3 mg BID was initiated. Patient began to display signs of catatonia after admission [facility]. Ativan was started, and catatonia  resolved. Lindsey Richmond responded well to treatment with no adverse effects reported. [facility] for [facility] days. She stabilized with medication. She was discharged on the medications listed below. She has shown improvement with improved mood, affect, sleep, appetite, and interaction. She is calm and cooperative with no signs of responding to internal stimuli. She states readiness for discharge. She denies any SI/HI/AVH and contracts for safety. She agrees to [facility] (see below). She is provided with prescriptions and medication samples upon discharge. Her mother is picking her up for discharge home.  Physical Findings: AIMS: Facial and Oral Movements Muscles of Facial Expression: None, normal Lips and Perioral Area: None, normal Jaw: Minimal Tongue: Minimal,Extremity Movements Upper (arms, wrists, hands, fingers): None, normal Lower (legs, knees, ankles, toes): None, normal, Trunk Movements Neck, shoulders, hips: None, normal, Overall Severity Severity of abnormal movements (highest score from questions above): Minimal Incapacitation due to abnormal movements: None, normal Patient's awareness of abnormal movements (rate only patient's report): No Awareness, Dental Status Current problems with teeth and/or dentures?: No Does patient usually wear dentures?: No  CIWA:    COWS:  Musculoskeletal: Strength & Muscle Tone: within normal limits Gait & Station: normal Patient leans: N/A  Psychiatric Specialty Exam: Physical Exam  Nursing note and vitals reviewed. Constitutional: She is oriented to person, place, and time. She appears well-developed and well-nourished.  Cardiovascular: Normal rate.  Respiratory: Effort normal.  Neurological: She is alert and oriented to person, place, and time.  Psychiatric: Her behavior is normal.    Review of Systems  Constitutional: Negative.   Psychiatric/Behavioral: Negative for depression, hallucinations, memory loss, substance  abuse and suicidal ideas. The patient is not nervous/anxious and does not have insomnia.     Blood pressure 125/73, pulse 90, temperature 98.7 F (37.1 C), resp. rate 16, height  (1.6 m), weight 90.7 kg, SpO2 100 %.Body mass index is 35.43 kg/m.  General Appearance: Casual  Eye Contact:  Good  Speech:  Clear and Coherent and Normal Rate  Volume:  Normal  Mood:  Euthymic  Affect:  Appropriate and Congruent  Thought Process:  Coherent  Orientation:  Full (Time, Place, and Person)  Thought Content:  WDL  Suicidal Thoughts:  No  Homicidal Thoughts:  No  Memory:  Immediate;   Good Recent;   Fair  Judgement:  Intact  Insight:  Fair  Psychomotor Activity:  Normal  Concentration:  Concentration: Good  Recall:  Good  Fund of Knowledge:  Fair  Language:  Fair  Akathisia:  No  Handed:  Right  AIMS (if indicated):     Assets:  Communication Skills Desire for Improvement Housing Social Support  ADL's:  Intact  Cognition:  WNL  Sleep:  Number of Hours: 2.25     Have you used any form of tobacco in the last 30 days? (Cigarettes, Smokeless Tobacco, Cigars, and/or Pipes): No  Has this patient used any form of tobacco in the last 30 days? (Cigarettes, Smokeless Tobacco, Cigars, and/or Pipes)  No  Blood Alcohol level:  Lab Results  Component Value Date   ETH <10 01/13/2019   ETH <5 05/05/2017    Metabolic Disorder Labs:  Lab Results  Component Value Date   HGBA1C 4.9 05/09/2017   MPG 93.93 05/09/2017   Lab Results  Component Value Date   PROLACTIN 102.0 (H) 05/09/2017   Lab Results  Component Value Date   CHOL 150 05/09/2017   TRIG 53 05/09/2017   HDL 33 (L) 05/09/2017   CHOLHDL 4.5 05/09/2017   VLDL 11 05/09/2017   LDLCALC 106 (H) 05/09/2017    See Psychiatric Specialty Exam and Suicide Risk Assessment completed by Attending Physician prior to discharge.  Discharge destination:  Home  Is patient on multiple antipsychotic therapies at discharge:  Yes,   Do  you recommend tapering to monotherapy for antipsychotics?  Yes   Has Patient had three or more failed trials of antipsychotic monotherapy by history:  Yes,   Antipsychotic medications that previously failed include:   1.  Abilify (noncompliance)., 2.  Risperdal. and 3.  Geodon.  Recommended Plan for Multiple Antipsychotic Therapies: Taper to monotherapy as described:  Outpatient psychiatrist may taper to antipsychotic monotherapy as patient's symptoms allow.   Allergies as of 01/17/2019   No Known Allergies     Medication List    STOP taking these medications   buPROPion 75 MG tablet Commonly known as:  WELLBUTRIN   divalproex 500 MG 24 hr tablet Commonly known as:  DEPAKOTE ER   hydrOXYzine 25 MG tablet Commonly known as:  ATARAX/VISTARIL     TAKE these medications  Indication  ARIPiprazole ER 400 MG Srer injection Commonly known as:  ABILIFY MAINTENA Inject 2 mLs (400 mg total) into the muscle every 28 (twenty-eight) days. Due 02/13/19 What changed:    additional instructions  Another medication with the same name was removed. Continue taking this medication, and follow the directions you see here.  Indication:  Mood control   benztropine 0.5 MG tablet Commonly known as:  COGENTIN Take 1 tablet (0.5 mg total) by mouth 2 (two) times daily. What changed:    how much to take  how to take this  when to take this  additional instructions  Indication:  Extrapyramidal Reaction caused by Medications   LORazepam 0.5 MG tablet Commonly known as:  ATIVAN Take 1 tablet (0.5 mg total) by mouth 3 (three) times daily for 7 days.  Indication:  Catatonia   risperiDONE 3 MG tablet Commonly known as:  RISPERDAL Take 2 tablets (6 mg total) by mouth at bedtime.  Indication:  Delusions   traZODone 100 MG tablet Commonly known as:  DESYREL Take 2 tablets (200 mg total) by mouth at bedtime. What changed:    how much to take  additional instructions  Indication:  Anxiety  Disorder      Follow-up Information    [facility] Follow up on 01/30/2019.   Why:  Hospital follow up appointment is Monday, 4/20 at 1:30p.   The appointment will be held over the telephone. The provider will call the patient.  Contact information: [address] [place] [address] [phone]           Follow-up recommendations: Activity as tolerated. Diet as recommended by primary care physician. Keep all scheduled follow-up appointments as recommended.   Comments:   Patient is instructed to take all prescribed medications as recommended. Report any side effects or adverse reactions to your outpatient psychiatrist. Patient is instructed to abstain from alcohol and illegal drugs while on prescription medications. In the event of worsening symptoms, patient is instructed to call the crisis hotline, 911, or go to the nearest emergency department for evaluation and treatment.  Signed: Aldean Baker, NP 01/17/2019, 9:56 AM

## 2019-01-17 NOTE — Progress Notes (Signed)
CSW aware of anticipated discharge today. CSW reached out to father, Janeal Poyer, [phone]. Father is a Naval architect and is en route to [place]. He reports he may be back in [place] by this evening to pick up the patient. Father will call mother (who is in [place]) regarding discharge. Father states he will follow up with CSW.  Enid Cutter, LCSW-A Clinical Social Worker

## 2019-01-20 ENCOUNTER — Encounter (HOSPITAL_COMMUNITY): Payer: Self-pay

## 2019-01-20 ENCOUNTER — Inpatient Hospital Stay (HOSPITAL_COMMUNITY)
Admission: RE | Admit: 2019-01-20 | Discharge: 2019-01-28 | DRG: 885 | Disposition: A | Discharge status: Home / Self Care | Payer: Medicaid Other | Attending: Psychiatry | Admitting: Psychiatry

## 2019-01-20 ENCOUNTER — Other Ambulatory Visit: Payer: Self-pay

## 2019-01-20 DIAGNOSIS — R45851 Suicidal ideations: Secondary | ICD-10-CM | POA: Diagnosis present

## 2019-01-20 DIAGNOSIS — Z833 Family history of diabetes mellitus: Secondary | ICD-10-CM

## 2019-01-20 DIAGNOSIS — F209 Schizophrenia, unspecified: Secondary | ICD-10-CM | POA: Diagnosis present

## 2019-01-20 DIAGNOSIS — Z8249 Family history of ischemic heart disease and other diseases of the circulatory system: Secondary | ICD-10-CM

## 2019-01-20 DIAGNOSIS — F329 Major depressive disorder, single episode, unspecified: Secondary | ICD-10-CM | POA: Diagnosis present

## 2019-01-20 DIAGNOSIS — Z9114 Patient's other noncompliance with medication regimen: Secondary | ICD-10-CM

## 2019-01-20 DIAGNOSIS — F259 Schizoaffective disorder, unspecified: Principal | ICD-10-CM | POA: Diagnosis present

## 2019-01-20 DIAGNOSIS — G479 Sleep disorder, unspecified: Secondary | ICD-10-CM | POA: Diagnosis present

## 2019-01-20 DIAGNOSIS — F419 Anxiety disorder, unspecified: Secondary | ICD-10-CM | POA: Diagnosis present

## 2019-01-20 DIAGNOSIS — Z818 Family history of other mental and behavioral disorders: Secondary | ICD-10-CM

## 2019-01-20 DIAGNOSIS — Z79899 Other long term (current) drug therapy: Secondary | ICD-10-CM

## 2019-01-20 MED ORDER — TRAZODONE HCL 100 MG PO TABS
200.0000 mg | ORAL_TABLET | Freq: Every day | ORAL | Status: DC
  Administered 2019-01-20 – 2019-01-23 (×4): 200 mg via ORAL
  Filled 2019-01-20 (×6): qty 2

## 2019-01-20 MED ORDER — BENZTROPINE MESYLATE 0.5 MG PO TABS
0.5000 mg | ORAL_TABLET | Freq: Two times a day (BID) | ORAL | Status: DC
  Administered 2019-01-20 – 2019-01-22 (×4): 0.5 mg via ORAL
  Filled 2019-01-20 (×7): qty 1

## 2019-01-20 MED ORDER — MAGNESIUM HYDROXIDE 400 MG/5ML PO SUSP: 30.0000 mL | Freq: Every day | ORAL | Status: DC | PRN

## 2019-01-20 MED ORDER — ALUM & MAG HYDROXIDE-SIMETH 200-200-20 MG/5ML PO SUSP: 30.0000 mL | ORAL | Status: DC | PRN

## 2019-01-20 MED ORDER — ACETAMINOPHEN 325 MG PO TABS: 650.0000 mg | ORAL_TABLET | Freq: Four times a day (QID) | ORAL | Status: DC | PRN

## 2019-01-20 MED ORDER — RISPERIDONE 3 MG PO TABS
6.0000 mg | ORAL_TABLET | Freq: Every day | ORAL | Status: DC
  Administered 2019-01-20 – 2019-01-21 (×2): 6 mg via ORAL
  Filled 2019-01-20 (×3): qty 2

## 2019-01-20 MED ORDER — LORAZEPAM 0.5 MG PO TABS
0.5000 mg | ORAL_TABLET | Freq: Three times a day (TID) | ORAL | Status: DC
  Administered 2019-01-21 – 2019-01-28 (×21): 0.5 mg via ORAL
  Filled 2019-01-20 (×24): qty 1

## 2019-01-20 NOTE — Plan of Care (Signed)
[facility] Observation Crisis Plan  Reason for Crisis Plan:  Crisis Stabilization   Plan of Care:  Referral for Inpatient Hospitalization  Family Support:    father  Current Living Environment:  Living Arrangements: Parent  Insurance:   Hospital Account    Name Acct ID Class Status Primary Coverage   Daryl, [place] [id number] BEHAVIORAL HEALTH OBSERVATION Open None        Guarantor Account (for Hospital Account [id number])    Name Relation to Pt Service Area Active? Acct Type   [name] Self [facility] Yes Behavioral Health   Address Phone       [address] [place], [place] [address] [phone](H)          Coverage Information (for Hospital Account [id number])    Not on file      Legal Guardian:     Primary Care Provider:  System, Pcp Not In  Current Outpatient Providers:  [facility]  Psychiatrist:  Name of Psychiatrist: Vesta Mixer  Counselor/Therapist:  Name of Therapist: None  Compliant with Medications:  Yes  Additional Information:   Delos Haring 4/20/202011:03 PM

## 2019-01-20 NOTE — BH Assessment (Signed)
Assessment Note  Lindsey Richmond is an 23 y.o. female.  -Patient was brought in by father.  Patient gave permission for father to be present during assessment.  Father had taken patient to [facility].  They recommended she be brought back to [facility].  Patient was in patient at [facility] 04/14 to 04/17.  Patient appears to be actively psychotic.  She stares off into space and appears to be thought blocking.  She says she is here because she "believes in Jesus."  She says she hears Jesus and sees things "that are indescribable."    Today father said she had a knife to her throat and he had to take it from her.  Patient was asked if she was suicidal, which she denies.  Patient says to Lindsey Conn, FNP however that "it was God's plan for her to cut her throat."    Patient denies HI.  She says that she has not been sleeping well since she was discharged on Friday.  She says she was up and down at night.  She stays in bed and does not feel like doing anything.  Patient stares into space and has a flat affect.  She says that she is up and down at night.  She has been taking medication as prescribed she says.  -Patient was seen by Lindsey Conn, FNP.  He recommends inpatient psychiatric care.  AC Kim Brooks investigated bed availability at [facility] but there is not one available appropriate to patient needs.  TTS to seek placement and patient to stay in OBS unit.  Diagnosis: F20.9 Schizophrenia  Past Medical History: No past medical history on file.  No past surgical history on file.  Family History:  Family History  Problem Relation Age of Onset  . Heart failure Mother   . Hypertension Mother   . Diabetes Father     Social History:  reports that she has never smoked. She has never used smokeless tobacco. She reports that she does not drink alcohol or use drugs.  Additional Social History:  Alcohol / Drug Use Pain Medications: See d/c medication list Prescriptions: See d/c medication list Over the Counter: See d/c  medication list History of alcohol / drug use?: No history of alcohol / drug abuse  CIWA: CIWA-Ar BP: 123/69 Pulse Rate: (!) 104 COWS:    Allergies: No Known Allergies  Home Medications: (Not in a hospital admission)   OB/GYN Status:  No LMP recorded. (Menstrual status: Other).  General Assessment Data Location of Assessment: [facility] Assessment Services TTS Assessment: In system Is this a Tele or Face-to-Face Assessment?: Face-to-Face Is this an Initial Assessment or a Re-assessment for this encounter?: Initial Assessment Patient Accompanied by:: Parent(Father) Language Other than English: No Living Arrangements: Other (Comment)(Living w/ mother) What gender do you identify as?: Female Marital status: Single Pregnancy Status: No Living Arrangements: Parent Can pt return to current living arrangement?: Yes Admission Status: Voluntary Is patient capable of signing voluntary admission?: Yes Referral Source: Self/Family/Friend(Father accompanied her.) Insurance type: MCD  Medical Screening Exam [facility] Walk-in ONLY) Medical Exam completed: Yes(Jason Allyson Sabal, FNP)  Crisis Care Plan Living Arrangements: Parent Name of Psychiatrist: Vesta Mixer Name of Therapist: None  Education Status Is patient currently in school?: No Is the patient employed, unemployed or receiving disability?: Receiving disability income  Risk to self with the past 6 months Suicidal Ideation: No Has patient been a risk to self within the past 6 months prior to admission? : No Suicidal Intent: No Has patient had any suicidal  intent within the past 6 months prior to admission? : No Is patient at risk for suicide?: No Suicidal Plan?: No Has patient had any suicidal plan within the past 6 months prior to admission? : No Access to Means: No What has been your use of drugs/alcohol within the last 12 months?: Denies Previous Attempts/Gestures: No How many times?: 0 Other Self Harm Risks: Hx of banging head on  wall Triggers for Past Attempts: None known Intentional Self Injurious Behavior: Damaging, Bruising Comment - Self Injurious Behavior: Last week banging head. Family Suicide History: Unknown Recent stressful life event(s): Other (Comment)(Pt cannot identify a stressor) Persecutory voices/beliefs?: No Depression: No Depression Symptoms: Despondent(Pt with flat affect.) Substance abuse history and/or treatment for substance abuse?: No Suicide prevention information given to non-admitted patients: Not applicable  Risk to Others within the past 6 months Homicidal Ideation: No Does patient have any lifetime risk of violence toward others beyond the six months prior to admission? : No Thoughts of Harm to Others: No Current Homicidal Intent: No Current Homicidal Plan: No Access to Homicidal Means: No Identified Victim: No one History of harm to others?: No Assessment of Violence: None Noted Violent Behavior Description: None reported Does patient have access to weapons?: No Criminal Charges Pending?: No Does patient have a court date: No Is patient on probation?: No  Psychosis Hallucinations: Auditory, Visual(Seeing the '"indescribable" and hearing voices) Delusions: Grandiose(Religious)  Mental Status Report Appearance/Hygiene: In scrubs(Scrubs from [facility]) Eye Contact: Fair Motor Activity: Freedom of movement, Shuffling(Slow moving) Speech: Logical/coherent, Slow, Soft Level of Consciousness: Alert Mood: Anxious, Preoccupied Affect: Blunted Anxiety Level: Moderate Thought Processes: Irrelevant, Thought Blocking Judgement: Impaired Orientation: Person, Place, Time Obsessive Compulsive Thoughts/Behaviors: Moderate  Cognitive Functioning Concentration: Decreased Memory: Recent Impaired, Remote Intact Is patient IDD: No Insight: Poor Impulse Control: Poor Appetite: Good Have you had any weight changes? : No Change Sleep: Decreased Total Hours of Sleep: (Up and  down) Vegetative Symptoms: Staying in bed, Decreased grooming  ADLScreening [facility] Assessment Services) Patient's cognitive ability adequate to safely complete daily activities?: Yes Patient able to express need for assistance with ADLs?: Yes Independently performs ADLs?: Yes (appropriate for developmental age)  Prior Inpatient Therapy Prior Inpatient Therapy: Yes Prior Therapy Dates: 01/2019; 2018, multiple admits Prior Therapy Facilty/Provider(s): [facility] Reason for Treatment: Schizophrenia  Prior Outpatient Therapy Prior Outpatient Therapy: Yes Prior Therapy Dates: Current Prior Therapy Facilty/Provider(s): [facility] Reason for Treatment: Schizophrenia Does patient have an ACCT team?: No Does patient have Intensive In-House Services?  : No Does patient have [facility] services? : Yes Does patient have P4CC services?: No  ADL Screening (condition at time of admission) Patient's cognitive ability adequate to safely complete daily activities?: Yes Patient able to express need for assistance with ADLs?: Yes Independently performs ADLs?: Yes (appropriate for developmental age)       Abuse/Neglect Assessment (Assessment to be complete while patient is alone) Abuse/Neglect Assessment Can Be Completed: Yes Physical Abuse: Denies Verbal Abuse: Denies Sexual Abuse: Denies Exploitation of patient/patient's resources: Denies Self-Neglect: Denies     Merchant navy officer (For Healthcare) Does Patient Have a Medical Advance Directive?: No Would patient like information on creating a medical advance directive?: No - Patient declined          Disposition:  Disposition Initial Assessment Completed for this Encounter: Yes Disposition of Patient: Admit Type of inpatient treatment program: Adult Patient refused recommended treatment: No Mode of transportation if patient is discharged/movement?: N/A Patient referred to: Other (Comment)(Pt in OBS unit at [facility])  On Site Evaluation by:    Reviewed with Physician:    Beatriz StallionHarvey, Zyia Kaneko Ray 01/20/2019 8:40 PM

## 2019-01-20 NOTE — Progress Notes (Signed)
OBS Admission Note:  24 yr female admitted to the observation unit who presents VC in no acute distress for the treatment of Psychosis Pt appears flat and depressed. Pt was calm and cooperative with admission process. Pt denies SI/HI/AVH at this time , but pt stated her family thought she was suicidal this morning. Pt appeared to have thought blocking, delayed responses and responding to internal stimuli during admission. "I just want to do what's right, I repent"   Skin was assessed (Cari-RN)and found to be clear of any abnormal marks apart from multiple acne bumps. PT searched and no contraband found, POC and unit policies explained and understanding verbalized. Consents obtained. Food and fluids offered, and fluids accepted.  R: Pt had no additional questions or concerns.

## 2019-01-21 ENCOUNTER — Other Ambulatory Visit: Payer: Self-pay

## 2019-01-21 ENCOUNTER — Inpatient Hospital Stay (HOSPITAL_COMMUNITY)
Admission: AD | Admit: 2019-01-21 | Payer: No Typology Code available for payment source | Source: Intra-hospital | Admitting: Psychiatry

## 2019-01-21 ENCOUNTER — Encounter (HOSPITAL_COMMUNITY): Payer: Self-pay | Admitting: *Deleted

## 2019-01-21 DIAGNOSIS — Z8249 Family history of ischemic heart disease and other diseases of the circulatory system: Secondary | ICD-10-CM | POA: Diagnosis not present

## 2019-01-21 DIAGNOSIS — Z79899 Other long term (current) drug therapy: Secondary | ICD-10-CM | POA: Diagnosis not present

## 2019-01-21 DIAGNOSIS — F209 Schizophrenia, unspecified: Secondary | ICD-10-CM

## 2019-01-21 DIAGNOSIS — G479 Sleep disorder, unspecified: Secondary | ICD-10-CM | POA: Diagnosis present

## 2019-01-21 DIAGNOSIS — R45851 Suicidal ideations: Secondary | ICD-10-CM | POA: Diagnosis present

## 2019-01-21 DIAGNOSIS — F419 Anxiety disorder, unspecified: Secondary | ICD-10-CM | POA: Diagnosis present

## 2019-01-21 DIAGNOSIS — Z818 Family history of other mental and behavioral disorders: Secondary | ICD-10-CM | POA: Diagnosis not present

## 2019-01-21 DIAGNOSIS — F200 Paranoid schizophrenia: Secondary | ICD-10-CM | POA: Diagnosis not present

## 2019-01-21 DIAGNOSIS — F259 Schizoaffective disorder, unspecified: Secondary | ICD-10-CM | POA: Diagnosis present

## 2019-01-21 DIAGNOSIS — Z9114 Patient's other noncompliance with medication regimen: Secondary | ICD-10-CM | POA: Diagnosis not present

## 2019-01-21 DIAGNOSIS — F329 Major depressive disorder, single episode, unspecified: Secondary | ICD-10-CM | POA: Diagnosis present

## 2019-01-21 DIAGNOSIS — Z833 Family history of diabetes mellitus: Secondary | ICD-10-CM | POA: Diagnosis not present

## 2019-01-21 LAB — COMPREHENSIVE METABOLIC PANEL
ALT: 28 U/L (ref 0–44)
AST: 27 U/L (ref 15–41)
Albumin: 3.7 g/dL (ref 3.5–5.0)
Alkaline Phosphatase: 49 U/L (ref 38–126)
Anion gap: 9 (ref 5–15)
BUN: 13 mg/dL (ref 6–20)
CO2: 27 mmol/L (ref 22–32)
Calcium: 9.3 mg/dL (ref 8.9–10.3)
Chloride: 100 mmol/L (ref 98–111)
Creatinine, Ser: 0.93 mg/dL (ref 0.44–1.00)
GFR calc Af Amer: >60 mL/min (ref >60)
GFR calc non Af Amer: >60 mL/min (ref >60)
Glucose, Bld: 98 mg/dL (ref 70–99)
Potassium: 4.2 mmol/L (ref 3.5–5.1)
Sodium: 136 mmol/L (ref 135–145)
Total Bilirubin: 0.5 mg/dL (ref 0.3–1.2)
Total Protein: 8.4 g/dL — ABNORMAL HIGH (ref 6.5–8.1)

## 2019-01-21 LAB — CBC
HCT: 39 % (ref 36.0–46.0)
Hemoglobin: 12.7 g/dL (ref 12.0–15.0)
MCH: 30.8 pg (ref 26.0–34.0)
MCHC: 32.6 g/dL (ref 30.0–36.0)
MCV: 94.7 fL (ref 80.0–100.0)
Platelets: 228 10*3/uL (ref 150–400)
RBC: 4.12 MIL/uL (ref 3.87–5.11)
RDW: 12.6 % (ref 11.5–15.5)
WBC: 5.3 10*3/uL (ref 4.0–10.5)
nRBC: 0 % (ref 0.0–0.2)

## 2019-01-21 LAB — RAPID URINE DRUG SCREEN, HOSP PERFORMED
Amphetamines: NOT DETECTED
Barbiturates: NOT DETECTED
Benzodiazepines: POSITIVE — AB
Cocaine: NOT DETECTED
Opiates: NOT DETECTED
Tetrahydrocannabinol: NOT DETECTED

## 2019-01-21 LAB — PREGNANCY, URINE: Preg Test, Ur: NEGATIVE

## 2019-01-21 LAB — TSH: TSH: 2.351 u[IU]/mL (ref 0.350–4.500)

## 2019-01-21 MED ORDER — OLANZAPINE 5 MG PO TBDP
5.0000 mg | ORAL_TABLET | Freq: Every day | ORAL | Status: AC
  Administered 2019-01-21: 5 mg via ORAL
  Filled 2019-01-21: qty 1

## 2019-01-21 MED ORDER — OLANZAPINE 5 MG PO TBDP
5.0000 mg | ORAL_TABLET | Freq: Two times a day (BID) | ORAL | Status: DC
  Administered 2019-01-22: 5 mg via ORAL
  Filled 2019-01-21 (×3): qty 1

## 2019-01-21 MED ORDER — OLANZAPINE 5 MG PO TBDP
5.0000 mg | ORAL_TABLET | Freq: Two times a day (BID) | ORAL | Status: DC
  Filled 2019-01-21: qty 1

## 2019-01-21 NOTE — Progress Notes (Signed)
[name] Progress Note  01/21/2019 1:02 PM Lindsey Richmond  MRN:  [id number] Subjective:   Ms. Lindsey Richmond was admitted for observation due to psychosis. She has not been sleeping and has been depressed. She held a knife to her throat. Her father reports that she has been compliant with her medications. On interview today, she reports, "I held a knife to my throat and my father thought I wanted to harm myself. You know about Christians and God's plan for [place]?" She is disorganized in thought process. She denies SI, HI or AVH. She denies a prior history of suicide attempts. She reports "sometimes" when asked if she is compliant with her medications. She endorses increased appetite with no changes in her weight.   Principal Problem: Schizophrenia (HCC) Diagnosis: Principal Problem:   Schizophrenia (HCC)  Total Time spent with patient: 30 minutes  Past Psychiatric History: Schizophrenia   Past Medical History: History reviewed. No pertinent past medical history. History reviewed. No pertinent surgical history. Family History:  Family History  Problem Relation Age of Onset  . Heart failure Mother   . Hypertension Mother   . Diabetes Father    Family Psychiatric  History: Maternal aunt-schizophrenia  Social History:  Social History   Substance and Sexual Activity  Alcohol Use No     Social History   Substance and Sexual Activity  Drug Use No    Social History   Socioeconomic History  . Marital status: Single    Spouse name: Not on file  . Number of children: Not on file  . Years of education: Not on file  . Highest education level: Not on file  Occupational History  . Not on file  Social Needs  . Financial resource strain: Not on file  . Food insecurity:    Worry: Not on file    Inability: Not on file  . Transportation needs:    Medical: Not on file    Non-medical: Not on file  Tobacco Use  . Smoking status: Never Smoker  . Smokeless tobacco: Never Used  Substance and Sexual  Activity  . Alcohol use: No  . Drug use: No  . Sexual activity: Yes    Birth control/protection: Pill  Lifestyle  . Physical activity:    Days per week: Not on file    Minutes per session: Not on file  . Stress: Not on file  Relationships  . Social connections:    Talks on phone: Not on file    Gets together: Not on file    Attends religious service: Not on file    Active member of club or organization: Not on file    Attends meetings of clubs or organizations: Not on file    Relationship status: Not on file  Other Topics Concern  . Not on file  Social History Narrative  . Not on file   Additional Social History: She lives at home with her mother and father. She denies alcohol or illicit substance use.    Pain Medications: See d/c medication list Prescriptions: See d/c medication list Over the Counter: See d/c medication list History of alcohol / drug use?: No history of alcohol / drug abuse                    Sleep: Recently Poor  Appetite:  Good  Current Medications: Current Facility-Administered Medications  Medication Dose Route Frequency Provider Last Rate Last Dose  . acetaminophen (TYLENOL) tablet 650 mg  650 mg Oral Q6H  PRN Jackelyn Poling, NP      . alum & mag hydroxide-simeth (MAALOX/MYLANTA) 200-200-20 MG/5ML suspension 30 mL  30 mL Oral Q4H PRN [name] A, NP      . benztropine (COGENTIN) tablet 0.5 mg  0.5 mg Oral BID [name] A, NP   0.5 mg at 01/21/19 0724  . LORazepam (ATIVAN) tablet 0.5 mg  0.5 mg Oral TID [name] A, NP   0.5 mg at 01/21/19 0724  . magnesium hydroxide (MILK OF MAGNESIA) suspension 30 mL  30 mL Oral Daily PRN [name] A, NP      . risperiDONE (RISPERDAL) tablet 6 mg  6 mg Oral QHS [name] A, NP   6 mg at 01/20/19 2144  . traZODone (DESYREL) tablet 200 mg  200 mg Oral QHS [name] A, NP   200 mg at 01/20/19 2144    Lab Results:  Results for orders placed or performed during the hospital encounter of  01/20/19 (from the past 48 hour(s))  Pregnancy, urine     Status: None   Collection Time: 01/21/19  6:53 AM  Result Value Ref Range   Preg Test, Ur NEGATIVE NEGATIVE    Comment:        THE SENSITIVITY OF THIS METHODOLOGY IS >20 mIU/mL. Performed at [facility], [address] [place], [place] [address]   Urine rapid drug screen (hosp performed)not at [facility]     Status: Abnormal   Collection Time: 01/21/19  6:53 AM  Result Value Ref Range   Opiates NONE DETECTED NONE DETECTED   Cocaine NONE DETECTED NONE DETECTED   Benzodiazepines POSITIVE (A) NONE DETECTED   Amphetamines NONE DETECTED NONE DETECTED   Tetrahydrocannabinol NONE DETECTED NONE DETECTED   Barbiturates NONE DETECTED NONE DETECTED    Comment: (NOTE) DRUG SCREEN FOR MEDICAL PURPOSES ONLY.  IF CONFIRMATION IS NEEDED FOR ANY PURPOSE, NOTIFY LAB WITHIN 5 DAYS. LOWEST DETECTABLE LIMITS FOR URINE DRUG SCREEN Drug Class                     Cutoff (ng/mL) Amphetamine and metabolites    1000 Barbiturate and metabolites    200 Benzodiazepine                 200 Tricyclics and metabolites     300 Opiates and metabolites        300 Cocaine and metabolites        300 THC                            50 Performed at [facility], [address] [place], [place] [address]   CBC     Status: None   Collection Time: 01/21/19  7:04 AM  Result Value Ref Range   WBC 5.3 4.0 - 10.5 K/uL   RBC 4.12 3.87 - 5.11 MIL/uL   Hemoglobin 12.7 12.0 - 15.0 g/dL   HCT 10.0 71.2 - 19.7 %   MCV 94.7 80.0 - 100.0 fL   MCH 30.8 26.0 - 34.0 pg   MCHC 32.6 30.0 - 36.0 g/dL   RDW 58.8 32.5 - 49.8 %   Platelets 228 150 - 400 K/uL   nRBC 0.0 0.0 - 0.2 %    Comment: Performed at [facility], [address] [place], [place] [address]  Comprehensive metabolic panel     Status: Abnormal   Collection Time: 01/21/19  7:04 AM  Result Value Ref Range   Sodium 136 135 - 145 mmol/L   Potassium 4.2  3.5 - 5.1 mmol/L   Chloride 100 98 - 111 mmol/L   CO2 27 22 - 32 mmol/L   Glucose, Bld 98 70 - 99 mg/dL   BUN 13 6 - 20 mg/dL   Creatinine, Ser 1.610.93 0.44 - 1.00 mg/dL   Calcium 9.3 8.9 - 09.610.3 mg/dL   Total Protein 8.4 (H) 6.5 - 8.1 g/dL   Albumin 3.7 3.5 - 5.0 g/dL   AST 27 15 - 41 U/L   ALT 28 0 - 44 U/L   Alkaline Phosphatase 49 38 - 126 U/L   Total Bilirubin 0.5 0.3 - 1.2 mg/dL   GFR calc non Af Amer >60 >60 mL/min   GFR calc Af Amer >60 >60 mL/min   Anion gap 9 5 - 15    Comment: Performed at [facility], [address] [place], [place] [address]  TSH     Status: None   Collection Time: 01/21/19  7:04 AM  Result Value Ref Range   TSH 2.351 0.350 - 4.500 uIU/mL    Comment: Performed by a 3rd Generation assay with a functional sensitivity of <=0.01 uIU/mL. Performed at [facility], [address] [place], [place] [address]     Blood Alcohol level:  Lab Results  Component Value Date   [facility]   ETH <5 05/05/2017    Metabolic Disorder Labs: Lab Results  Component Value Date   HGBA1C 4.9 05/09/2017   MPG 93.93 05/09/2017   Lab Results  Component Value Date   PROLACTIN 102.0 (H) 05/09/2017   Lab Results  Component Value Date   CHOL 150 05/09/2017   TRIG 53 05/09/2017   HDL 33 (L) 05/09/2017   CHOLHDL 4.5 05/09/2017   VLDL 11 05/09/2017   LDLCALC 106 (H) 05/09/2017    Physical Findings: AIMS: Facial and Oral Movements Muscles of Facial Expression: None, normal Lips and Perioral Area: None, normal Jaw: None, normal Tongue: Minimal,Extremity Movements Upper (arms, wrists, hands, fingers): None, normal Lower (legs, knees, ankles, toes): None, normal, Trunk Movements Neck, shoulders, hips: None, normal, Overall Severity Severity of abnormal movements (highest score from questions above): Minimal Incapacitation due to abnormal movements: None, normal Patient's awareness of abnormal movements (rate only  patient's report): No Awareness, Dental Status Current problems with teeth and/or dentures?: No Does patient usually wear dentures?: No  CIWA:  CIWA-Ar Total: 0 COWS:     Musculoskeletal: Strength & Muscle Tone: within normal limits Gait & Station: normal Patient leans: N/A  Psychiatric Specialty Exam: Physical Exam  Nursing note and vitals reviewed. Constitutional: She is oriented to person, place, and time. She appears well-developed and well-nourished.  HENT:  Head: Normocephalic and atraumatic.  Neck: Normal range of motion.  Respiratory: Effort normal.  Musculoskeletal: Normal range of motion.  Neurological: She is alert and oriented to person, place, and time.  Psychiatric: Her speech is delayed and tangential. She is slowed. Thought content is delusional. Cognition and memory are impaired. She expresses impulsivity. She exhibits a depressed mood.    Review of Systems  Psychiatric/Behavioral: Positive for depression. Negative for hallucinations and suicidal ideas.  All other systems reviewed and are negative.   Blood pressure 129/66, pulse 90, temperature 98.3 F (36.8 C), resp. rate 16, SpO2 100 %.There is no height or weight on file to calculate BMI.  General Appearance: Fairly Groomed, young, African American  female with short wet hair who is sitting on her bed. NAD.   Eye Contact:  Good  Speech:  Clear and Coherent and Slow  Volume:  Decreased  Mood:  Depressed  Affect:  Constricted  Thought Process:  Disorganized and Descriptions of Associations: Tangential  Orientation:  Full (Time, Place, and Person)  Thought Content:  Delusions  Suicidal Thoughts:  No  Homicidal Thoughts:  No  Memory:  Immediate;   Fair Recent;   Fair Remote;   Fair  Judgement:  Impaired  Insight:  Poor  Psychomotor Activity:  Decreased  Concentration:  Concentration: Fair and Attention Span: Fair  Recall:  [employer] of Knowledge:  Fair  Language:  Good  Akathisia:  No  Handed:  Right   AIMS (if indicated):   N/A  Assets:  Housing Physical Health Resilience Social Support  ADL's:  Intact  Cognition: Impaired due to psychiatric condition.   Sleep:   Recently poor.   Assessment:  Olinda Nola is a 23 y.o. female who was admitted with psychosis likely secondary to poor medication compliance. She warrants inpatient psychiatric hospitalization for stabilization and treatment.   Treatment Plan Summary: Daily contact with patient to assess and evaluate symptoms and progress in treatment and Medication management  Cherly Beach, DO 01/21/2019, 1:02 PM

## 2019-01-21 NOTE — BH Assessment (Signed)
[facility] Assessment Progress Note  Per Juanetta Beets, DO, this pt requires psychiatric hospitalization at this time.  Berneice Heinrich, RN, [facility] has assigned pt to [facility] Rm [id number]; Adult Unit will call when they are ready to receive pt.Dedra Skeens agrees to review consent forms with pt.  Pt's nurse, Rayfield Citizen, has been notified.  Doylene Canning, [place] Behavioral Health Coordinator [phone]

## 2019-01-21 NOTE — Tx Team (Signed)
Initial Treatment Plan 01/21/2019 3:28 PM Lindsey Richmond LGX:[id number]    PATIENT STRESSORS: Marital or family conflict Medication change or noncompliance   PATIENT STRENGTHS: Ability for insight Average or above average intelligence General fund of knowledge Supportive family/friends   PATIENT IDENTIFIED PROBLEMS: Depression Suicidal thoughts "My dad thought I was suicidal but I'm not"                     DISCHARGE CRITERIA:  Ability to meet basic life and health needs Improved stabilization in mood, thinking, and/or behavior Reduction of life-threatening or endangering symptoms to within safe limits Verbal commitment to aftercare and medication compliance  PRELIMINARY DISCHARGE PLAN: Attend aftercare/continuing care group Return to previous living arrangement  PATIENT/FAMILY INVOLVEMENT: This treatment plan has been presented to and reviewed with the patient, Lindsey Richmond, and/or family member, .  The patient and family have been given the opportunity to ask questions and make suggestions.  Lindsey Richmond, [place], [place] 01/21/2019, 3:28 PM

## 2019-01-21 NOTE — Progress Notes (Signed)
Lindsey Richmond is a 23 year old female pt admitted on voluntary basis. On admission, she reports that her dad brought her to the hospital because he thought she was suicidal but denies this on admission and able to contract for safety in the hospital. She reports that she has been taking her medications. She reports that she has not been using any drugs or alcohol. She reports that she lives with her mother and reports that she will return there once she is discharged. Reigan was cooperative during admission, she was oriented to the milieu and safety maintained.

## 2019-01-21 NOTE — Progress Notes (Signed)
EKG completed as ordered. Called Dr Tamera Punt and left number to return call. Pt has no signs or symptoms of distress.

## 2019-01-21 NOTE — Progress Notes (Signed)
Patient ID: Lindsey Richmond, female   DOB: 04/23/1996, 23 y.o.   MRN: [id number]   D: Patient pleasant on approach tonight. In bed initially but did get up for medications. Smiling some when speaking with her. Denies any SI or any issues tonight. Reports being here at least 4 times in the past A: Staff will monitor on q 15 minute checks, follow treatment plan, and give medications as ordered. R: Cooperative on the unit.

## 2019-01-21 NOTE — Progress Notes (Signed)
Psychoeducational Group Note  Date:  01/21/2019 Time:  2044  Group Topic/Focus:  Wrap-Up Group:   The focus of this group is to help patients review their daily goal of treatment and discuss progress on daily workbooks.  Participation Level: Did Not Attend  Participation Quality:  Not Applicable  Affect:  Not Applicable  Cognitive:  Not Applicable  Insight:  Not Applicable  Engagement in Group: Not Applicable  Additional Comments:  The patient did not attend group this evening since she was asleep in her bedroom.   Hazle Coca S 01/21/2019, 8:44 PM
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
## 2019-01-22 DIAGNOSIS — F200 Paranoid schizophrenia: Secondary | ICD-10-CM

## 2019-01-22 LAB — CK: Total CK: 286 U/L — ABNORMAL HIGH (ref 38–234)

## 2019-01-22 MED ORDER — PALIPERIDONE ER 6 MG PO TB24
6.0000 mg | ORAL_TABLET | Freq: Every day | ORAL | Status: DC
  Administered 2019-01-22: 21:00:00 6 mg via ORAL
  Filled 2019-01-22 (×2): qty 1

## 2019-01-22 MED ORDER — OLANZAPINE 5 MG PO TBDP
5.0000 mg | ORAL_TABLET | Freq: Two times a day (BID) | ORAL | Status: DC | PRN
  Administered 2019-01-24 – 2019-01-27 (×2): 5 mg via ORAL
  Filled 2019-01-22 (×2): qty 1

## 2019-01-22 NOTE — Progress Notes (Signed)
[facility] NOVEL CORONAVIRUS (COVID-19) DAILY CHECK-OFF SYMPTOMS - answer yes or no to each - every day NO YES  Have you had a fever in the past 24 hours?  . Fever (Temp > 37.80C / 100F) X   Have you had any of these symptoms in the past 24 hours? . New Cough .  Sore Throat  .  Shortness of Breath .  Difficulty Breathing .  Unexplained Body Aches   X   Have you had any one of these symptoms in the past 24 hours not related to allergies?   . Runny Nose .  Nasal Congestion .  Sneezing   X   If you have had runny nose, nasal congestion, sneezing in the past 24 hours, has it worsened?  X   EXPOSURES - check yes or no X   Have you traveled outside the state in the past 14 days?  X   Have you been in contact with someone with a confirmed diagnosis of COVID-19 or PUI in the past 14 days without wearing appropriate PPE?  X   Have you been living in the same home as a person with confirmed diagnosis of COVID-19 or a PUI (household contact)?    X   Have you been diagnosed with COVID-19?    X              What to do next: Answered NO to all: Answered YES to anything:   Proceed with unit schedule Follow the [facility] Inpatient Flowsheet.   

## 2019-01-22 NOTE — Tx Team (Signed)
Interdisciplinary Treatment and Diagnostic Plan Update  01/22/2019 Time of Session:  Lindsey Richmond MRN: [id number]  Principal Diagnosis: Schizophrenia [facility])  Secondary Diagnoses: Principal Problem:   Schizophrenia ([place])   Current Medications:  Current Facility-Administered Medications  Medication Dose Route Frequency Provider Last Rate Last Dose  . acetaminophen (TYLENOL) tablet 650 mg  650 mg Oral Q6H PRN Lindon Romp A, NP      . alum & mag hydroxide-simeth (MAALOX/MYLANTA) 200-200-20 MG/5ML suspension 30 mL  30 mL Oral Q4H PRN Lindon Romp A, NP      . LORazepam (ATIVAN) tablet 0.5 mg  0.5 mg Oral TID Lindon Romp A, NP   0.5 mg at 01/22/19 0816  . magnesium hydroxide (MILK OF MAGNESIA) suspension 30 mL  30 mL Oral Daily PRN Lindon Romp A, NP      . OLANZapine zydis (ZYPREXA) disintegrating tablet 5 mg  5 mg Oral BID PRN Sharma Covert, MD      . paliperidone (INVEGA) 24 hr tablet 6 mg  6 mg Oral QHS Sharma Covert, MD      . traZODone (DESYREL) tablet 200 mg  200 mg Oral QHS Lindon Romp A, NP   200 mg at 01/21/19 2133   PTA Medications: Medications Prior to Admission  Medication Sig Dispense Refill Last Dose  . divalproex (DEPAKOTE ER) 250 MG 24 hr tablet Take 250 mg by mouth 2 (two) times daily.     . hydrOXYzine (ATARAX/VISTARIL) 25 MG tablet Take 25 mg by mouth every morning.     . benztropine (COGENTIN) 0.5 MG tablet Take 1 tablet (0.5 mg total) by mouth 2 (two) times daily. (Patient not taking: Reported on 01/21/2019) 60 tablet 2 Not Taking at Unknown time  . LORazepam (ATIVAN) 0.5 MG tablet Take 1 tablet (0.5 mg total) by mouth 3 (three) times daily for 7 days. (Patient not taking: Reported on 01/21/2019) 21 tablet 0 Not Taking at Unknown time  . risperiDONE (RISPERDAL) 3 MG tablet Take 2 tablets (6 mg total) by mouth at bedtime. (Patient not taking: Reported on 01/21/2019) 60 tablet 2 Not Taking at Unknown time  . traZODone (DESYREL) 100 MG tablet Take 2 tablets (200 mg  total) by mouth at bedtime. (Patient not taking: Reported on 01/21/2019) 60 tablet 2 Not Taking at Unknown time    Patient Stressors: Marital or family conflict Medication change or noncompliance  Patient Strengths: Ability for insight Average or above average intelligence General fund of knowledge Supportive family/friends  Treatment Modalities: Medication Management, Group therapy, Case management,  1 to 1 session with clinician, Psychoeducation, Recreational therapy.   Physician Treatment Plan for Primary Diagnosis: Schizophrenia ([place]) Long Term Goal(s):     Short Term Goals:    Medication Management: Evaluate patient's response, side effects, and tolerance of medication regimen.  Therapeutic Interventions: 1 to 1 sessions, Unit Group sessions and Medication administration.  Evaluation of Outcomes: Not Met  Physician Treatment Plan for Secondary Diagnosis: Principal Problem:   Schizophrenia ([place])  Long Term Goal(s):     Short Term Goals:       Medication Management: Evaluate patient's response, side effects, and tolerance of medication regimen.  Therapeutic Interventions: 1 to 1 sessions, Unit Group sessions and Medication administration.  Evaluation of Outcomes: Not Met   RN Treatment Plan for Primary Diagnosis: Schizophrenia ([place]) Long Term Goal(s): Knowledge of disease and therapeutic regimen to maintain health will improve  Short Term Goals: Ability to participate in decision making will improve, Ability to verbalize feelings  will improve, Ability to disclose and discuss suicidal ideas, Ability to identify and develop effective coping behaviors will improve and Compliance with prescribed medications will improve  Medication Management: RN will administer medications as ordered by provider, will assess and evaluate patient's response and provide education to patient for prescribed medication. RN will report any adverse and/or side effects to prescribing  provider.  Therapeutic Interventions: 1 on 1 counseling sessions, Psychoeducation, Medication administration, Evaluate responses to treatment, Monitor vital signs and CBGs as ordered, Perform/monitor CIWA, COWS, AIMS and Fall Risk screenings as ordered, Perform wound care treatments as ordered.  Evaluation of Outcomes: Not Met   LCSW Treatment Plan for Primary Diagnosis: Schizophrenia ([place]) Long Term Goal(s): Safe transition to appropriate next level of care at discharge, Engage patient in therapeutic group addressing interpersonal concerns.  Short Term Goals: Engage patient in aftercare planning with referrals and resources  Therapeutic Interventions: Assess for all discharge needs, 1 to 1 time with Social worker, Explore available resources and support systems, Assess for adequacy in community support network, Educate family and significant other(s) on suicide prevention, Complete Psychosocial Assessment, Interpersonal group therapy.  Evaluation of Outcomes: Not Met   Progress in Treatment: Attending groups: Yes. Participating in groups: Yes. Taking medication as prescribed: Yes. Toleration medication: Yes. Family/Significant other contact made: No, will contact:  if patient consents to collateral contacts Patient understands diagnosis: Yes. Discussing patient identified problems/goals with staff: Yes. Medical problems stabilized or resolved: Yes. Denies suicidal/homicidal ideation: Yes. Issues/concerns per patient self-inventory: No. Other:   New problem(s) identified: None   New Short Term/Long Term Goal(s): medication stabilization, elimination of SI thoughts, development of comprehensive mental wellness plan.    Patient Goals:    Discharge Plan or Barriers: CSW will continue to follow and assess for a possible referrals and discharge plans.   Reason for Continuation of Hospitalization: Anxiety Depression Hallucinations Medication stabilization Suicidal  ideation  Estimated Length of Stay: 3-5 days   Attendees: Patient: 01/22/2019 10:36 AM  Physician: Dr. Myles Lipps, MD 01/22/2019 10:36 AM  Nursing: Yetta Flock.Lenna Sciara RN 01/22/2019 10:36 AM  RN Care Manager: 01/22/2019 10:36 AM  Social Worker: Radonna Ricker, [place] 01/22/2019 10:36 AM  Recreational Therapist:  01/22/2019 10:36 AM  Other:  01/22/2019 10:36 AM  Other:  01/22/2019 10:36 AM  Other: 01/22/2019 10:36 AM    Scribe for Treatment Team: Marylee Floras, [place] 01/22/2019 10:36 AM

## 2019-01-22 NOTE — Progress Notes (Signed)
Recreation Therapy Notes  Date:  4.22.20 Time: 0930 Location: [address] Dayroom  Group Topic: Stress Management  Goal Area(s) Addresses:  Patient will identify positive stress management techniques. Patient will identify benefits of using stress management post d/c.  Intervention:  Stress Management  Activity :  Guided Imagery.  LRT introduced the stress management technique of guided imagery.  LRT read a script that allowed patients to envision their peaceful place.  Patients were to listen and follow as script was read to engage in activity.  Education:  Stress Management, Discharge Planning.   Education Outcome: Acknowledges Education  Clinical Observations/Feedback:  Pt did not attend group.     Trivia Heffelfinger, LRT/CTRS          Brenen Beigel A 01/22/2019 10:38 AM 
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
## 2019-01-22 NOTE — BHH Counselor (Signed)
Adult Comprehensive Assessment  Patient ID: Kenn FileMaiya Meleski, female   DOB: 1996-02-15, 23 y.o.   MRN: [id number]   Information Source: Information source: Patient  Current Stressors:  Patient states their primary concerns and needs for treatment are:: "My dad thought I was suicidal" Patient states their goals for this hospitilization and ongoing recovery are:: "get right with Jesus"  Patient denies ant current stressors at this time.    Living/Environment/Situation:  Living Arrangements: Both parents Living conditions (as described by patient or guardian): loving home; lives with mother, father How long has patient lived in current situation?: all her life.  What is atmosphere in current home: Comfortable, Loving, Supportive  Family History:  Marital status: Single Are you sexually active?: No What is your sexual orientation?: heterosexual Has your sexual activity been affected by drugs, alcohol, medication, or emotional stress?: n/a  Does patient have children?: No  Childhood History:  By whom was/is the patient raised?: Both parents Additional childhood history information: born in [place]  Description of patient's relationship with caregiver when they were a child: close to both parents; mother [place]; father from [place];  Patient's description of current relationship with people who raised him/her: gets along better with father than mother.  Father is truck driver often on the road.  How were you disciplined when you got in trouble as a child/adolescent?: n/a  Does patient have siblings?: Yes Number of Siblings: 1 Description of patient's current relationship with siblings: close to her brother  Did patient suffer any verbal/emotional/physical/sexual abuse as a child?: No Did patient suffer from severe childhood neglect?: No Has patient ever been sexually abused/assaulted/raped as an adolescent or adult?: No Was the patient ever a victim of a crime or a disaster?:  No Witnessed domestic violence?: No Has patient been effected by domestic violence as an adult?: No No update to trauma history: 01/16/19.  Education:  Highest grade of school patient has completed: some college Currently a student?:No   Employment/Work Situation:  Employment situation: Unemployed Patient's job has been impacted by current illness: NA What is the longest time patient has a held a job?: never worked  Where was the patient employed at that time?: never worked  Has patient ever been in the [employer] and Companymilitary?: No Has patient ever served in combat?: No Did You Receive Any Psychiatric Treatment/Services While in [employer] traderthe Military?: No Are There Guns or Other Weapons in Your Home?: None reported.   Financial Resources:  Financial resources: Support from parents / caregiver Does patient have a Lawyerrepresentative payee or guardian?: No  Alcohol/Substance Abuse:  What has been your use of drugs/alcohol within the last 12 months?: alcohol: pt denies, drugs: pt denies If attempted suicide, did drugs/alcohol play a role in this?: No Alcohol/Substance Abuse Treatment Hx: Denies past history If yes, describe treatment:  Has alcohol/substance abuse ever caused legal problems?: No  Social Support System: Conservation officer, natureatient's Community Support System: Fair Museum/gallery exhibitions officerDescribe Community Support System: dad, brother, Jesus Type of faith/religion:Christian How does patient's faith help to cope with current illness?: "Jesus supports me."  Leisure/Recreation:  Leisure and Hobbies: reading,jogging, and walking in the neighborhood  Strengths/Needs:   What is the patient's perception of their strengths?: "funny, detail oriented, good listener" Patient states they can use these personal strengths during their treatment to contribute to their recovery: Pt unable to answer this question.  Patient states these barriers may affect/interfere with their treatment: none Patient states these barriers may affect  their return to the community: none Other important information patient would  like considered in planning for their treatment: none  Discharge Plan:   Currently receiving community mental health services: Yes (From Whom)([facility]) Patient states concerns and preferences for aftercare planning are: Will continue with [facility]: meds and [facility] for therapy.  Patient states they will know when they are safe and ready for discharge when: "when I get myself together" Does patient have access to transportation?: Yes Does patient have financial barriers related to discharge medications?: Yes Patient description of barriers related to discharge medications: no insurance Will patient be returning to same living situation after discharge?: Yes  Summary/Recommendations:   Summary and Recommendations (to be completed by the evaluator): Cosette is a 23 year old female who is diagnosed Schizophrenia (HCC). She presented to the hospital seeking treatment for psychosis. The patient recently discharged from [facility] on 01/14/2019. During the assessment, Annalucia was pleasant and cooperative with providing information. Adara states that her mother brought her to the hospital because "my dad thought I was suicidal". Eryca states that she is not suicidal and that she only experienced an increase in depression. Oshyn shared that she wasnt currently experiencing any auditory and visual hallucinations. and that she did not have any current suicidal ideation. Jaquasha reports following up with [facility] for outpatient medication management and therapy services. Alyzabeth can benefit from crisis stabilization, medication management, therapeutic milieu and referral services.   Maeola Sarah. 01/22/2019

## 2019-01-22 NOTE — Progress Notes (Signed)
Lindsey Richmond attended wrap-up group. She denies SI/HI/AVH/Pain at present. Pt appears animated in affect and mood. Pt states she hopes to go home tomorrow. Pt noted to brighten up on approach. No new c/o's. Support offered. Will continue with POC.
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
## 2019-01-22 NOTE — Progress Notes (Signed)
Patient rated her day as an 8 out of 10. She verbalized that she slept a great deal , but found a way to attend her groups. Her goal for tomorrow is to participate more and to request to be discharged.

## 2019-01-22 NOTE — Plan of Care (Addendum)
Patient was pleasant upon approach. Denies SI HI AVH, denies physical pain. Earlier in the day, patient approached the main nurses station and told the secretary he was hearing voices. When RN approached patient and asked if she was hearing voices, she laughed and denied. PRN medication was held.  Safety is maintained with 15 minute checks as well as environmental checks. Will continue to monitor.  Problem: Education: Goal: Knowledge of the prescribed therapeutic regimen will improve Outcome: Progressing   Problem: Coping: Goal: Will verbalize feelings Outcome: Progressing   Problem: Education: Goal: Will be free of psychotic symptoms Outcome: Not Progressing   Problem: Coping: Goal: Coping ability will improve Outcome: Not Progressing

## 2019-01-22 NOTE — BHH Group Notes (Signed)
Adult Psychoeducational Group Note  Date:  01/22/2019 Time:  8:58 AM  Group Topic/Focus:  Goals Group:   The focus of this group is to help patients establish daily goals to achieve during treatment and discuss how the patient can incorporate goal setting into their daily lives to aide in recovery.  Participation Level:  Active  Participation Quality:  Appropriate  Affect:  Appropriate  Cognitive:  Alert  Insight: Appropriate  Engagement in Group:  Engaged  Modes of Intervention:  Orientation  Additional Comments:  Pt attended and participated in orientation/goals group. Pt goal for today is to become well so she can get back home/   Dellia Nims 01/22/2019, 8:58 AM

## 2019-01-22 NOTE — BHH Group Notes (Signed)
Adult Psychoeducational Group Note  Date:  01/22/2019 Time:  5:43 PM  Group Topic/Focus:  Question and Answer Therapy  Participation Level:  Active  Participation Quality:  Appropriate  Affect:  Appropriate  Cognitive:  Alert  Insight: Appropriate  Engagement in Group:  Engaged  Modes of Intervention:  Discussion  Additional Comments:  Pt attended and participated in Question and Answer psycho-ed group. The purpose of this activity is for pt to engage in an open discussion of different therapeutic questions.   Dellia Nims 01/22/2019, 5:43 PM

## 2019-01-22 NOTE — [facility] Suicide Risk Assessment (Signed)
[facility]   Nursing information obtained from:  Patient Demographic factors:  Adolescent or young adult, Unemployed Current Mental Status:  NA Loss Factors:  Decrease in vocational status Historical Factors:  Prior suicide attempts, Family history of mental illness or substance abuse Risk Reduction Factors:  Sense of responsibility to family, Living with another person, especially a relative, Positive coping skills or problem solving skills  Total Time spent with patient: 20 minutes Principal Problem: Schizophrenia (HCC) Diagnosis:  Principal Problem:   Schizophrenia (HCC)  Subjective Data: Patient is seen and examined.  Patient is a 23 year old female with a past psychiatric history significant for schizophrenia who presented to the behavioral health hospital as a walk-in patient on 01/20/2019 with psychosis and suicidal ideation.  The patient's father brought the patient in for examination.  She had been discharged from the behavioral health hospital for treatment of schizophrenia on 4/17.  She had been given the long-acting Abilify injection as well as oral Risperdal.  On examination she was hyper religious, agitated, having thought blocking.  The father stated the patient had taken a knife to her throat, and he had to take it from her.  The patient told staff in the emergency evaluation that "it was God's plan to cut my throat".  She received Risperdal 6 mg p.o. nightly as well as Zyprexa 5 mg p.o. nightly and trazodone 200 mg p.o. nightly last night.  This a.m. she remains psychotic and delusional.  We discussed the possibility of switching from the long-acting Abilify injection to the long-acting paliperidone injection.  She was admitted to the hospital for evaluation and stabilization.  Continued Clinical Symptoms:  Alcohol Use Disorder Identification Test Final Score (AUDIT): 0 The "Alcohol Use Disorders Identification Test", Guidelines for Use in Primary Care,  Second Edition.  World Science writerHealth Organization [facility](WHO). Score between 0-7:  no or low risk or alcohol related problems. Score between 8-15:  moderate risk of alcohol related problems. Score between 16-19:  high risk of alcohol related problems. Score 20 or above:  warrants further diagnostic evaluation for alcohol dependence and treatment.   CLINICAL FACTORS:   Schizophrenia:   Command hallucinatons Less than 21 years old Paranoid or undifferentiated type   Musculoskeletal: Strength & Muscle Tone: within normal limits Gait & Station: normal Patient leans: N/A  Psychiatric Specialty Exam: Physical Exam  Nursing note and vitals reviewed. Constitutional: She is oriented to person, place, and time. She appears well-developed and well-nourished.  HENT:  Head: Normocephalic and atraumatic.  Respiratory: Effort normal.  Neurological: She is alert and oriented to person, place, and time.    ROS  Blood pressure (!) 83/36, pulse (!) 134, temperature 97.8 F (36.6 C), temperature source Oral, resp. rate 18, height 5\' 3"  (1.6 m), weight 96.6 kg, SpO2 99 %.Body mass index is 37.73 kg/m.  General Appearance: Disheveled  Eye Contact:  Fair  Speech:  Normal Rate  Volume:  Normal  Mood:  Dysphoric  Affect:  Non-Congruent  Thought Process:  Disorganized and Descriptions of Associations: Loose  Orientation:  Negative  Thought Content:  Delusions, Hallucinations: Auditory Command:  Paranoid delusions and Paranoid Ideation  Suicidal Thoughts:  Yes.  with intent/plan  Homicidal Thoughts:  No  Memory:  Immediate;   Fair Recent;   Fair Remote;   Fair  Judgement:  Impaired  Insight:  Lacking  Psychomotor Activity:  Increased  Concentration:  Concentration: Fair and Attention Span: Fair  Recall:  [employer]Fair  Fund of Knowledge:  Fair  Language:  Fair  Akathisia:  Negative  Handed:  Right  AIMS (if indicated):     Assets:  Housing Resilience  ADL's:  Intact  Cognition:  WNL  Sleep:  Number of  Hours: 6.75      COGNITIVE FEATURES THAT CONTRIBUTE TO RISK:  None    SUICIDE RISK:   Moderate:  Frequent suicidal ideation with limited intensity, and duration, some specificity in terms of plans, no associated intent, good self-control, limited dysphoria/symptomatology, some risk factors present, and identifiable protective factors, including available and accessible social support.  PLAN OF CARE: Patient is seen and examined.  Patient is a 23 year old female with a past psychiatric history significant for schizophrenia who was readmitted on 01/21/2019 secondary to psychosis and suicidal ideation.  She will be admitted to the hospital.  She will be integrated into the milieu.  She had been given the Abilify injection 400 mg IM on her last hospitalization in which she was discharged on 4/17.  She was also given Risperdal 3 mg p.o. twice daily.  Most probably she was noncompliant with the oral medication, and that is what led to her worsening psychosis.  Additionally it does not appear as though the long-acting Abilify injection was really effective for her.  We will stop the Risperdal and switch to oral paliperidone.  We will give her 6 mg p.o. nightly tonight.  Additionally social work will attempt to find out whether or not her follow-up psychiatric treatment would be able to give her the long-acting paliperidone injection.  If possible we may go to the long-acting injection tomorrow or the day after.  Review of her laboratories were all essentially normal.  Her drug screen was positive for benzodiazepines.  Her blood pressure is a little low this morning, and she is mildly tachycardic.  This will be monitored.  I certify that inpatient services furnished can reasonably be expected to improve the patient's condition.   Antonieta Pert, MD 01/22/2019, 10:36 AM

## 2019-01-23 MED ORDER — PALIPERIDONE ER 3 MG PO TB24
9.0000 mg | ORAL_TABLET | Freq: Every day | ORAL | Status: DC
  Administered 2019-01-23: 21:00:00 9 mg via ORAL
  Filled 2019-01-23 (×3): qty 3

## 2019-01-23 MED ORDER — PALIPERIDONE PALMITATE ER 234 MG/1.5ML IM SUSY
234.0000 mg | PREFILLED_SYRINGE | Freq: Once | INTRAMUSCULAR | Status: AC
  Administered 2019-01-23: 15:00:00 234 mg via INTRAMUSCULAR

## 2019-01-23 NOTE — Progress Notes (Signed)
[facility] MD Progress Note  01/23/2019 10:31 AM Lindsey Richmond  MRN:  [id number] Subjective:  Patient is a 23 year old female with a past psychiatric history significant for schizophrenia who presented to the behavioral health hospital as a walk-in patient on 01/20/2019 with psychosis and suicidal ideation.   Objective: Patient is seen and examined.  Patient is a 23 year old female with the above-stated past psychiatric history is seen in follow-up.  Review of the nursing notes as well as examination today shows that she somewhat improved.  She still is having some inappropriate non-congruent smiling episodes.  She denied any suicidal or homicidal ideation today.  She did discuss her hyperreligiosity thoughts.  They appear to be less than on admission.  We discussed the fact that "God would not want you to cut your throat".  She seems to appreciate that a little bit better today.  She denied any auditory or visual hallucinations.  She still is hyper religious and some delusional thinking.  We discussed increasing her oral paliperidone to 9 mg p.o. nightly, and the possibility that we would attempt to get the paliperidone long-acting injectable at her mental health clinic if she continues to improve as it appears on the oral medication.  She did state that she had been taking the Risperdal at home as well.  Her vital signs are stable with regard to blood pressure and temperature.  She was a bit tachycardic this morning with a rate of 127.  She slept 6 hours last night.  Her EKG on admission had some nonspecific changes, and I am going to repeat that.  Principal Problem: Schizophrenia (HCC) Diagnosis: Principal Problem:   Schizophrenia (HCC)  Total Time spent with patient: 20 minutes  Past Psychiatric History: See admission H&P  Past Medical History: History reviewed. No pertinent past medical history. History reviewed. No pertinent surgical history. Family History:  Family History  Problem Relation Age of  Onset  . Heart failure Mother   . Hypertension Mother   . Diabetes Father    Family Psychiatric  History: See admission H&P Social History:  Social History   Substance and Sexual Activity  Alcohol Use No     Social History   Substance and Sexual Activity  Drug Use No    Social History   Socioeconomic History  . Marital status: Single    Spouse name: Not on file  . Number of children: Not on file  . Years of education: Not on file  . Highest education level: Not on file  Occupational History  . Not on file  Social Needs  . Financial resource strain: Not on file  . Food insecurity:    Worry: Not on file    Inability: Not on file  . Transportation needs:    Medical: Not on file    Non-medical: Not on file  Tobacco Use  . Smoking status: Never Smoker  . Smokeless tobacco: Never Used  Substance and Sexual Activity  . Alcohol use: No  . Drug use: No  . Sexual activity: Yes    Birth control/protection: Pill  Lifestyle  . Physical activity:    Days per week: Not on file    Minutes per session: Not on file  . Stress: Not on file  Relationships  . Social connections:    Talks on phone: Not on file    Gets together: Not on file    Attends religious service: Not on file    Active member of club or organization: Not on file  Attends meetings of clubs or organizations: Not on file    Relationship status: Not on file  Other Topics Concern  . Not on file  Social History Narrative  . Not on file   Additional Social History:    Pain Medications: See d/c medication list Prescriptions: See d/c medication list Over the Counter: See d/c medication list History of alcohol / drug use?: No history of alcohol / drug abuse                    Sleep: Fair  Appetite:  Fair  Current Medications: Current Facility-Administered Medications  Medication Dose Route Frequency Provider Last Rate Last Dose  . acetaminophen (TYLENOL) tablet 650 mg  650 mg Oral Q6H PRN  Nira ConnBerry, Jason A, NP      . alum & mag hydroxide-simeth (MAALOX/MYLANTA) 200-200-20 MG/5ML suspension 30 mL  30 mL Oral Q4H PRN Nira ConnBerry, Jason A, NP      . LORazepam (ATIVAN) tablet 0.5 mg  0.5 mg Oral TID Nira ConnBerry, Jason A, NP   0.5 mg at 01/23/19 0807  . magnesium hydroxide (MILK OF MAGNESIA) suspension 30 mL  30 mL Oral Daily PRN Nira ConnBerry, Jason A, NP      . OLANZapine zydis (ZYPREXA) disintegrating tablet 5 mg  5 mg Oral BID PRN Antonieta Pertlary, Aaliah Jorgenson Lawson, MD      . paliperidone (INVEGA) 24 hr tablet 9 mg  9 mg Oral QHS Antonieta Pertlary, Laverda Stribling Lawson, MD      . traZODone (DESYREL) tablet 200 mg  200 mg Oral QHS Nira ConnBerry, Jason A, NP   200 mg at 01/22/19 2127    Lab Results:  Results for orders placed or performed during the hospital encounter of 01/20/19 (from the past 48 hour(s))  CK     Status: Abnormal   Collection Time: 01/22/19  6:38 AM  Result Value Ref Range   Total CK 286 (H) 38 - 234 U/L    Comment: Performed at [facility], [address] [place], [place] [id number]    Blood Alcohol level:  Lab Results  Component Value Date   [facility] 01/13/2019   ETH <5 05/05/2017    Metabolic Disorder Labs: Lab Results  Component Value Date   HGBA1C 4.9 05/09/2017   MPG 93.93 05/09/2017   Lab Results  Component Value Date   PROLACTIN 102.0 (H) 05/09/2017   Lab Results  Component Value Date   CHOL 150 05/09/2017   TRIG 53 05/09/2017   HDL 33 (L) 05/09/2017   CHOLHDL 4.5 05/09/2017   VLDL 11 05/09/2017   LDLCALC 106 (H) 05/09/2017    Physical Findings: AIMS: Facial and Oral Movements Muscles of Facial Expression: None, normal Lips and Perioral Area: None, normal Jaw: None, normal Tongue: None, normal,Extremity Movements Upper (arms, wrists, hands, fingers): None, normal Lower (legs, knees, ankles, toes): None, normal, Trunk Movements Neck, shoulders, hips: None, normal, Overall Severity Severity of abnormal movements (highest score from questions above): None,  normal Incapacitation due to abnormal movements: None, normal Patient's awareness of abnormal movements (rate only patient's report): No Awareness, Dental Status Current problems with teeth and/or dentures?: No Does patient usually wear dentures?: No  CIWA:  CIWA-Ar Total: 0 COWS:     Musculoskeletal: Strength & Muscle Tone: within normal limits Gait & Station: normal Patient leans: N/A  Psychiatric Specialty Exam: Physical Exam  Nursing note and vitals reviewed. Constitutional: She is oriented to person, place, and time. She appears well-developed and well-nourished.  HENT:  Head: Normocephalic and atraumatic.  Respiratory: Effort normal.  Neurological: She is alert and oriented to person, place, and time.    ROS  Blood pressure (!) 106/52, pulse (!) 127, temperature 98.4 F (36.9 C), temperature source Oral, resp. rate 18, height  (1.6 m), weight 96.6 kg, SpO2 99 %.Body mass index is 37.73 kg/m.  General Appearance: Casual  Eye Contact:  Good  Speech:  Normal Rate  Volume:  Normal  Mood:  Euphoric  Affect:  Non-Congruent  Thought Process:  Coherent and Descriptions of Associations: Loose  Orientation:  Full (Time, Place, and Person)  Thought Content:  Delusions  Suicidal Thoughts:  No  Homicidal Thoughts:  No  Memory:  Immediate;   Fair Recent;   Fair Remote;   Fair  Judgement:  Impaired  Insight:  Lacking  Psychomotor Activity:  Increased  Concentration:  Concentration: Fair and Attention Span: Fair  Recall:  [employer] of Knowledge:  Fair  Language:  Good  Akathisia:  Negative  Handed:  Right  AIMS (if indicated):     Assets:  Desire for Improvement Resilience  ADL's:  Intact  Cognition:  WNL  Sleep:  Number of Hours: 6     Treatment Plan Summary: Daily contact with patient to assess and evaluate symptoms and progress in treatment, Medication management and Plan : Patient is seen and examined.  Patient is a 23 year old female with the above-stated  past psychiatric history who is seen in follow-up.   Diagnosis: #1 schizophrenia versus schizoaffective disorder  Patient looks a little bit better today.  She is slightly less hyper religious, and she denied any suicidal ideation.  I am going to increase her paliperidone to 9 mg p.o. nightly.  Social work is attempting to contact her outpatient clinic to see if they can get her the long-acting injectable paliperidone.  She is tachycardic this morning, and had some nonspecific changes on her baseline EKG.  We will repeat her EKG today.  Otherwise no change in her current medications. 1.  Continue lorazepam 0.5 mg p.o. 3 times daily for anxiety and agitation. 2.  Continue Zyprexa 5 mg p.o. twice daily PRN agitation. 3.  Increase paliperidone tablet to 9 mg p.o. nightly for psychosis and mood stability. 4.  Continue trazodone 200 mg p.o. nightly for insomnia. 5.  Obtain EKG today secondary to tachycardia. 6.  Disposition planning-in progress.  Antonieta Pert, MD 01/23/2019, 10:31 AM

## 2019-01-23 NOTE — Plan of Care (Addendum)
Patient was pleasant upon approach. Denies SI HI AH, endorses VH seeing shadows shaped like monsters. MD notified and medication was adjusted. EKG performed and given to physician. Safety is maintained with 15 minute checks. Patient is compliant with all medications.  Problem: Activity: Goal: Will verbalize the importance of balancing activity with adequate rest periods Outcome: Progressing   Problem: Education: Goal: Will be free of psychotic symptoms Outcome: Progressing Goal: Knowledge of the prescribed therapeutic regimen will improve Outcome: Progressing   Problem: Coping: Goal: Coping ability will improve Outcome: Progressing

## 2019-01-23 NOTE — Progress Notes (Signed)
[place] NOVEL CORONAVIRUS (COVID-19) DAILY CHECK-OFF SYMPTOMS - answer yes or no to each - every day NO YES  Have you had a fever in the past 24 hours?  . Fever (Temp > 37.80C / 100F) X   Have you had any of these symptoms in the past 24 hours? . New Cough .  Sore Throat  .  Shortness of Breath .  Difficulty Breathing .  Unexplained Body Aches   X   Have you had any one of these symptoms in the past 24 hours not related to allergies?   . Runny Nose .  Nasal Congestion .  Sneezing   X   If you have had runny nose, nasal congestion, sneezing in the past 24 hours, has it worsened?  X   EXPOSURES - check yes or no X   Have you traveled outside the state in the past 14 days?  X   Have you been in contact with someone with a confirmed diagnosis of COVID-19 or PUI in the past 14 days without wearing appropriate PPE?  X   Have you been living in the same home as a person with confirmed diagnosis of COVID-19 or a PUI (household contact)?    X   Have you been diagnosed with COVID-19?    X              What to do next: Answered NO to all: Answered YES to anything:   Proceed with unit schedule Follow the [facility] Inpatient Flowsheet.   

## 2019-01-23 NOTE — Progress Notes (Signed)
Psychoeducational Group Note  Date:  01/23/2019 Time: 2015 Group Topic/Focus:  wrap up group  Participation Level: Did Not Attend  Participation Quality:  Not Applicable  Affect:  Not Applicable  Cognitive:  Not Applicable  Insight:  Not Applicable  Engagement in Group: Not Applicable  Additional Comments:  Pt was in bed during group time.   Marcille Buffy 01/23/2019, 11:29 PM

## 2019-01-23 NOTE — Progress Notes (Signed)
Lindsey Richmond did not attend wrap-up group. She denies SI/HI/AVH/Pain at present. Pt appears animated/anxious in affect and mood. Pt stayed in her room for majority of shift. Pt states she hopes to go home tomorrow. Pt noted to brighten up on approach. No new c/o's. Support offered. Will continue with POC.

## 2019-01-24 MED ORDER — PALIPERIDONE ER 6 MG PO TB24
6.0000 mg | ORAL_TABLET | Freq: Every day | ORAL | Status: DC
  Administered 2019-01-24 – 2019-01-27 (×4): 6 mg via ORAL
  Filled 2019-01-24: qty 1
  Filled 2019-01-24: qty 7
  Filled 2019-01-24 (×4): qty 1

## 2019-01-24 MED ORDER — TRAZODONE HCL 100 MG PO TABS
100.0000 mg | ORAL_TABLET | Freq: Every day | ORAL | Status: DC
  Administered 2019-01-24 – 2019-01-26 (×3): 100 mg via ORAL
  Filled 2019-01-24 (×4): qty 1

## 2019-01-24 NOTE — Progress Notes (Signed)
[place] NOVEL CORONAVIRUS (COVID-19) DAILY CHECK-OFF SYMPTOMS - answer yes or no to each - every day NO YES  Have you had a fever in the past 24 hours?  . Fever (Temp > 37.80C / 100F) X   Have you had any of these symptoms in the past 24 hours? . New Cough .  Sore Throat  .  Shortness of Breath .  Difficulty Breathing .  Unexplained Body Aches   X   Have you had any one of these symptoms in the past 24 hours not related to allergies?   . Runny Nose .  Nasal Congestion .  Sneezing   X   If you have had runny nose, nasal congestion, sneezing in the past 24 hours, has it worsened?  X   EXPOSURES - check yes or no X   Have you traveled outside the state in the past 14 days?  X   Have you been in contact with someone with a confirmed diagnosis of COVID-19 or PUI in the past 14 days without wearing appropriate PPE?  X   Have you been living in the same home as a person with confirmed diagnosis of COVID-19 or a PUI (household contact)?    X   Have you been diagnosed with COVID-19?    X              What to do next: Answered NO to all: Answered YES to anything:   Proceed with unit schedule Follow the [facility] Inpatient Flowsheet.   

## 2019-01-24 NOTE — [facility] Group Notes (Signed)
LCSW Group Therapy Note 01/24/2019 2:10 PM  Type of Therapy/Topic: Group Therapy: Emotion Regulation  Participation Level: Did Not Attend   Description of Group:  The purpose of this group is to assist patients in learning to regulate negative emotions and experience positive emotions. Patients will be guided to discuss ways in which they have been vulnerable to their negative emotions. These vulnerabilities will be juxtaposed with experiences of positive emotions or situations, and patients will be challenged to use positive emotions to combat negative ones. Special emphasis will be placed on coping with negative emotions in conflict situations, and patients will process healthy conflict resolution skills.  Therapeutic Goals: 1. Patient will identify two positive emotions or experiences to reflect on in order to balance out negative emotions 2. Patient will label two or more emotions that they find the most difficult to experience 3. Patient will demonstrate positive conflict resolution skills through discussion and/or role plays  Summary of Patient Progress:  Invited, chose not to attend.    Therapeutic Modalities:  Cognitive Behavioral Therapy Feelings Identification Dialectical Behavioral Therapy   Alcario Drought Clinical Social Worker
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
## 2019-01-24 NOTE — Progress Notes (Signed)
Recreation Therapy Notes  Date:  4.24.20 Time: 0930 Location: [address] Dayroom  Group Topic: Stress Management  Goal Area(s) Addresses:  Patient will identify positive stress management techniques. Patient will identify benefits of using stress management post d/c.  Behavioral Response:  Engaged  Intervention:  Stress Management  Activity :  Progressive Muscle Relaxation.  LRT introduced the stress management technique of progressive muscle relaxation.  LRT read a script that talked patients through tensing and relaxing each muscle group individually.  Patients were to follow along as script was read to participate in activity.  Education:  Stress Management, Discharge Planning.   Education Outcome: Acknowledges Education  Clinical Observations/Feedback: Pt attended and participated in group.    Clayden Withem, LRT/CTRS         Mei Suits A 01/24/2019 10:37 AM 

## 2019-01-24 NOTE — [facility] Group Notes (Signed)
Pt did not attend wrap up group this evening. Pt was in their bed.

## 2019-01-24 NOTE — Progress Notes (Signed)
Pt presents with an animated affect and depressed mood. On approach, pt noted to be responding to internal stimuli and mumbling to herself. Pt denies AVH. Pt denies SI/HI. Pt reported fair sleep last.   Medications reviewed with pt. Verbal support provided. Pt encouraged to attend groups. 15 minute checks performed for safety.   Pt compliant with taking medications and denies any side effects.

## 2019-01-24 NOTE — Progress Notes (Signed)
Lindsey Richmond did not attend wrap-up group. She denies SI/HI/AVH/Pain at present. Pt appears flat in affect and mood. Pt stays in bed for majority of shift. Pt remains preoccupied with d/c. Fluids and snacks offered; Pt accepted. No new c/o's. Support offered. Will continue with POC

## 2019-01-25 NOTE — [facility] Group Notes (Addendum)
LCSW Group Therapy Note  01/25/2019    10:00-11:00am   Type of Therapy and Topic:  Group Therapy: Early Messages Received About Anger  Participation Level:  Active   Description of Group:   In this group, patients shared and discussed the early messages received in their lives about anger through parental or other adult modeling, teaching, repression, punishment, violence, and more.  Participants identified how those childhood lessons influence even now how they usually or often react when angered.  The group discussed that anger is a secondary emotion and what may be the underlying emotional themes that come out through anger outbursts or that are ignored through anger suppression.  Finally, as a group there was a conversation about the workbook's quote that "There is nothing wrong with anger; it is just a sign something needs to change."     Therapeutic Goals: 1. Patients will identify one or more childhood message about anger that they received and how it was taught to them. 2. Patients will discuss how these childhood experiences have influenced and continue to influence their own expression or repression of anger even today. 3. Patients will explore possible primary emotions that tend to fuel their secondary emotion of anger. 4. Patients will learn that anger itself is normal and cannot be eliminated, and that healthier coping skills can assist with resolving conflict rather than worsening situations.  Summary of Patient Progress:  The patient shared that her childhood lessons about anger were to not allow herself to be angry.  As a result, she is not an angry person, will get "sad and agitated," but added that if she ever actually became angry, she would think she was crazy because this happened one time in college and she was convinced she had lost her mind.  Therapeutic Modalities:   Cognitive Behavioral Therapy Motivation Interviewing  Lindsey Richmond  .
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
## 2019-01-25 NOTE — [facility] Group Notes (Signed)
Pt did not attend wrap up group this evening. Pt was in bed resting. 

## 2019-01-25 NOTE — Progress Notes (Signed)
   01/25/19 0820  COVID-19 Daily Checkoff  Have you had a fever (temp > 37.80C/100F)  in the past 24 hours?  No  If you have had runny nose, nasal congestion, sneezing in the past 24 hours, has it worsened? No  COVID-19 EXPOSURE  Have you traveled outside the state in the past 14 days? No  Have you been in contact with someone with a confirmed diagnosis of COVID-19 or PUI in the past 14 days without wearing appropriate PPE? No  Have you been living in the same home as a person with confirmed diagnosis of COVID-19 or a PUI (household contact)? No  Have you been diagnosed with COVID-19? No

## 2019-01-25 NOTE — Plan of Care (Signed)
D: Patient presents calm and cooperative. She displays some latency in speech/thought blocking. She slept poorly last night with the decreased dose of trazodone. However, she reports sleeping well, so there is some incongruence. Her appetite is fair, energy normal, concentration good. She rates her depression and anxiety 0/10 and hopelessness 1/10. She denies physical symptoms or withdrawal complaints. Patient denies SI/HI/AVH. She is preoccupied with discharge, asking about it frequently. Continues to be religiously preoccupied. Goal: "Living right" and "obeying Jesus."  A: Patient checked q15 min, and checks reviewed. Reviewed medication changes with patient and educated on side effects. Educated patient on importance of attending group therapy sessions and educated on several coping skills. Encouarged participation in milieu through recreation therapy and attending meals with peers. Support and encouragement provided. Fluids offered. R: Patient receptive to education on medications, and is medication compliant. Patient contracts for safety on the unit.  Problem: Education: Goal: Knowledge of the prescribed therapeutic regimen will improve Outcome: Progressing   Problem: Education: Goal: Will be free of psychotic symptoms Outcome: Not Progressing   Problem: Coping: Goal: Will verbalize feelings Outcome: Not Progressing   Problem: Role Relationship: Goal: Ability to communicate needs accurately will improve Outcome: Not Progressing

## 2019-01-25 NOTE — Progress Notes (Signed)
   01/25/19 0500  Sleep  Number of Hours 3

## 2019-01-25 NOTE — Progress Notes (Signed)
[facility] Group Notes:  (Nursing/MHT/Case Management/Adjunct)  Date:  01/25/2019  Time:  6:28 PM  Type of Therapy:  Nurse Education  Participation Level:  Active  Participation Quality:  Appropriate and Attentive  Affect:  Appropriate  Cognitive:  Alert and Appropriate  Insight:  Good  Engagement in Group:  Engaged  Modes of Intervention:  Activity and Clarification  Summary of Progress/Problems: In this group, patients learned about anger management. They were taught coping skills, such as progressive muscle relaxation, paired muscle relaxation, and paced breathing. They were also taught communication with "I feel" statements to communicate their needs. The patient was active and engaged in group.   Joselynne Killam C Lind Ausley 01/25/2019, 6:28 PM 

## 2019-01-26 NOTE — Progress Notes (Signed)
Patient is observed lying in bed resting but not asleep. She was informed that Clinical research associate would be her nurse for tonight and medications to take at hs. She did come up for snack and medications. She requested gingerale to drink with her medicines. She remains preoccupied and shows signs of thought blocking when writer attempted to engage her in conversation. Support given and safety maintained on unit with 15 min checks.

## 2019-01-26 NOTE — [facility] Group Notes (Signed)
[facility] LCSW Group Therapy Note  01/26/2019   10:00-11:00AM  Type of Therapy and Topic:  Group Therapy:  Unhealthy versus Healthy Supports, Which Am I?  Participation Level:  Active   Description of Group:  Patients in this group were introduced to the concept that additional supports including self-support are an essential part of recovery.  Initially a discussion was held about the differences between healthy versus unhealthy supports.  Patients were asked to share what unhealthy supports in their lives need to be addressed, as well as what additional healthy supports could be added for greater help in reaching their goals.   A song entitled "My Own Hero" was played and a group discussion ensued in which patients stated they could relate to the song and it inspired them to realize they have be willing to help themselves in order to succeed, because other people cannot achieve sobriety or stability for them.  We discussed addi  Therapeutic Goals: 1)  Highlight the differences between healthy and unhealthy supports 2)  Suggest the importance of being a part of one's own support system 2)  Discuss reasons people in one's life may eventually be unable to be continually supportive  3)  Identify the patient's current support system   4) Elicit commitments to add healthy supports and to become more conscious of being self-supportive   Summary of Patient Progress:  The patient listed as healthy supports the following:  Doctor and brother.  The patient expressed that unhealthy support(s) to be addressed include(s) becoming a better self-support.  Healthy supports which could be added for increased stability and happiness include a therapist, volunteer work, and personally working to become more focused on helping others.  Therapeutic Modalities:   Motivational Interviewing Activity  Lynnell Chad , MSW, LCSW

## 2019-01-26 NOTE — Progress Notes (Signed)
[facility] MD Progress Note  01/26/2019 8:38 AM Lindsey Richmond  MRN:  [id number]    Evaluation: Lindsey Richmond is internally preoccupied.  Observed pacing the unit talking to herself.  She denies suicidal or homicidal ideations.  Denies auditory or visual hallucinations.  Patient is requesting to be discharged home as she states her mother is requesting her to be home in order to follow-up with SSI appointment.  Patient reported she feels medication as increased her appetite.  [place] states she is resting well throughout. Patient continues to present with though blocking and delayed in responses. Patient is more reactive than on admission.  Staff to continue to monitor for safety support encouragement reassurance was provided.  Principal Problem: Schizophrenia (HCC) Diagnosis: Principal Problem:   Schizophrenia (HCC)  Total Time spent with patient: 15 minutes  Past Psychiatric History: See admission H&P  Past Medical History: History reviewed. No pertinent past medical history. History reviewed. No pertinent surgical history. Family History:  Family History  Problem Relation Age of Onset  . Heart failure Mother   . Hypertension Mother   . Diabetes Father    Family Psychiatric  History: See admission H&P Social History:  Social History   Substance and Sexual Activity  Alcohol Use No     Social History   Substance and Sexual Activity  Drug Use No    Social History   Socioeconomic History  . Marital status: Single    Spouse name: Not on file  . Number of children: Not on file  . Years of education: Not on file  . Highest education level: Not on file  Occupational History  . Not on file  Social Needs  . Financial resource strain: Not on file  . Food insecurity:    Worry: Not on file    Inability: Not on file  . Transportation needs:    Medical: Not on file    Non-medical: Not on file  Tobacco Use  . Smoking status: Never Smoker  . Smokeless tobacco: Never Used  Substance and Sexual  Activity  . Alcohol use: No  . Drug use: No  . Sexual activity: Yes    Birth control/protection: Pill  Lifestyle  . Physical activity:    Days per week: Not on file    Minutes per session: Not on file  . Stress: Not on file  Relationships  . Social connections:    Talks on phone: Not on file    Gets together: Not on file    Attends religious service: Not on file    Active member of club or organization: Not on file    Attends meetings of clubs or organizations: Not on file    Relationship status: Not on file  Other Topics Concern  . Not on file  Social History Narrative  . Not on file   Additional Social History:    Pain Medications: See d/c medication list Prescriptions: See d/c medication list Over the Counter: See d/c medication list History of alcohol / drug use?: No history of alcohol / drug abuse                    Sleep: Good  Appetite:  Good  Current Medications: Current Facility-Administered Medications  Medication Dose Route Frequency Provider Last Rate Last Dose  . acetaminophen (TYLENOL) tablet 650 mg  650 mg Oral Q6H PRN [name] A, NP      . alum & mag hydroxide-simeth (MAALOX/MYLANTA) 200-200-20 MG/5ML suspension 30 mL  30 mL  Oral Q4H PRN [name] A, NP      . LORazepam (ATIVAN) tablet 0.5 mg  0.5 mg Oral TID [name] A, NP   0.5 mg at 01/26/19 0816  . magnesium hydroxide (MILK OF MAGNESIA) suspension 30 mL  30 mL Oral Daily PRN [name] A, NP      . OLANZapine zydis (ZYPREXA) disintegrating tablet 5 mg  5 mg Oral BID PRN Antonieta Pert, MD   5 mg at 01/24/19 1425  . paliperidone (INVEGA) 24 hr tablet 6 mg  6 mg Oral QHS Antonieta Pert, MD   6 mg at 01/25/19 2143  . traZODone (DESYREL) tablet 100 mg  100 mg Oral QHS Antonieta Pert, MD   100 mg at 01/25/19 2143    Lab Results: No results found for this or any previous visit (from the past 48 hour(s)).  Blood Alcohol level:  Lab Results  Component Value Date   ETH  <10 01/13/2019   ETH <5 05/05/2017    Metabolic Disorder Labs: Lab Results  Component Value Date   HGBA1C 4.9 05/09/2017   MPG 93.93 05/09/2017   Lab Results  Component Value Date   PROLACTIN 102.0 (H) 05/09/2017   Lab Results  Component Value Date   CHOL 150 05/09/2017   TRIG 53 05/09/2017   HDL 33 (L) 05/09/2017   CHOLHDL 4.5 05/09/2017   VLDL 11 05/09/2017   LDLCALC 106 (H) 05/09/2017    Physical Findings: AIMS: Facial and Oral Movements Muscles of Facial Expression: None, normal Lips and Perioral Area: None, normal Jaw: None, normal Tongue: None, normal,Extremity Movements Upper (arms, wrists, hands, fingers): None, normal Lower (legs, knees, ankles, toes): None, normal, Trunk Movements Neck, shoulders, hips: None, normal, Overall Severity Severity of abnormal movements (highest score from questions above): None, normal Incapacitation due to abnormal movements: None, normal Patient's awareness of abnormal movements (rate only patient's report): No Awareness, Dental Status Current problems with teeth and/or dentures?: No Does patient usually wear dentures?: No  CIWA:  CIWA-Ar Total: 0 COWS:  COWS Total Score: 1  Musculoskeletal: Strength & Muscle Tone: within normal limits Gait & Station: normal Patient leans: N/A  Psychiatric Specialty Exam: Physical Exam  Nursing note and vitals reviewed. Constitutional: She is oriented to person, place, and time. She appears well-developed and well-nourished.  Respiratory: Effort normal.  Neurological: She is alert and oriented to person, place, and time.    Review of Systems  Constitutional: Negative.   Psychiatric/Behavioral: Negative for depression, hallucinations, substance abuse and suicidal ideas. The patient is nervous/anxious. The patient does not have insomnia.     Blood pressure 125/81, pulse (!) 107, temperature 98.4 F (36.9 C), temperature source Oral, resp. rate 16, height  (1.6 m), weight 96.6 kg,  SpO2 99 %.Body mass index is 37.73 kg/m.  General Appearance: Fairly Groomed  Eye Contact:  Good  Speech:  Clear and Coherent  Volume:  Normal  Mood:  Depressed  Affect:  Congruent and Flat  Thought Process:  Coherent  Orientation:  Full (Time, Place, and Person)  Thought Content:  Delusions and Rumination  Suicidal Thoughts:  No  Homicidal Thoughts:  No  Memory:  Immediate;   Fair Recent;   Fair  Judgement:  Impaired  Insight:  Lacking  Psychomotor Activity:  Normal  Concentration:  Concentration: Fair  Recall:  [employer] of Knowledge:  Fair  Language:  Fair  Akathisia:  No  Handed:  Right  AIMS (if indicated):  Assets:  Desire for Improvement Housing Resilience Social Support  ADL's:  Intact  Cognition:  WNL  Sleep:  Number of Hours: 2.5     Treatment Plan Summary: Daily contact with patient to assess and evaluate symptoms and progress in treatment and Medication management   Continue with current treatment plan on 01/26/2019 as listed below except where noted  Schizophrenia:  Continue Invega to 6 mg PO QHS for psychosis Continue trazodone to 100 mg PO QHS for sleep Continue Ativan 0.5 mg PO TID for anxiety Continue Zyprexa Zydis 5 mg PO BID PRN agitation  . CSW to continue working on discharge disposition Patient encouraged to participate within the therapeutic milieu  Oneta Rackanika N Lewis, NP 01/26/2019, 8:38 AM

## 2019-01-26 NOTE — Progress Notes (Signed)
Patient had been isolative to her room and came down the hall yelling "repent" as she was headed to the dayroom. The mht escorted her back to her room so she would not disturb patient in the dayroom watching tv.. Writer walked to her room and observed her lying in bed but not asleep. Writer gave her hs meds. She was compliant with her medications and walked to dayroom to  receive snack. She did remain in the dayroom briefly eating her snack before returning to her room. Safety maintained on unit with 15 min checks.

## 2019-01-26 NOTE — Progress Notes (Signed)
   01/26/19 0816  COVID-19 Daily Checkoff  Have you had a fever (temp > 37.80C/100F)  in the past 24 hours?  No  If you have had runny nose, nasal congestion, sneezing in the past 24 hours, has it worsened? No  COVID-19 EXPOSURE  Have you traveled outside the state in the past 14 days? No  Have you been in contact with someone with a confirmed diagnosis of COVID-19 or PUI in the past 14 days without wearing appropriate PPE? No  Have you been living in the same home as a person with confirmed diagnosis of COVID-19 or a PUI (household contact)? No  Have you been diagnosed with COVID-19? No

## 2019-01-26 NOTE — Plan of Care (Signed)
D: Patient presents flat, blunted, but labile. She is religiously preoccupied. She c/o having no emotions and seems to be discouraged by the negative symptoms of schizophrenia. Patient denies SI/HI/AVH. She slept fair last night, and declined medication to help with sleep. Her appetite is poor, energy high and concentration good. She rates depression 8/10, hopelessness 5/10 and anxiety 0/10. She denies withdrawal symptoms or physical complaints. On self-inventory she reported occasional SI. Her goal: "getting back with Jesus (getting discharged)." A: Patient checked q15 min, and checks reviewed. Reviewed medication changes with patient and educated on side effects. Educated patient on importance of attending group therapy sessions and educated on several coping skills. Encouarged participation in milieu through recreation therapy and attending meals with peers. Support and encouragement provided. Fluids offered. R: Patient receptive to education on medications, and is medication compliant. Patient contracts for safety on the unit.  Problem: Education: Goal: Will be free of psychotic symptoms Outcome: Not Progressing Goal: Knowledge of the prescribed therapeutic regimen will improve Outcome: Not Progressing   Problem: Coping: Goal: Coping ability will improve Outcome: Not Progressing Goal: Will verbalize feelings Outcome: Not Progressing

## 2019-01-27 MED ORDER — OLANZAPINE 10 MG PO TBDP
10.0000 mg | ORAL_TABLET | Freq: Every day | ORAL | Status: DC
  Filled 2019-01-27: qty 1
  Filled 2019-01-27: qty 7

## 2019-01-27 MED ORDER — TRAZODONE HCL 100 MG PO TABS
200.0000 mg | ORAL_TABLET | Freq: Every day | ORAL | Status: DC
  Administered 2019-01-27: 22:00:00 200 mg via ORAL
  Filled 2019-01-27 (×3): qty 2

## 2019-01-27 NOTE — Tx Team (Signed)
Interdisciplinary Treatment and Diagnostic Plan Update  01/27/2019 Time of Session:  Lindsey Richmond MRN: [id number]  Principal Diagnosis: Schizophrenia [facility](HCC)  Secondary Diagnoses: Principal Problem:   Schizophrenia (HCC)   Current Medications:  Current Facility-Administered Medications  Medication Dose Route Frequency Provider Last Rate Last Dose  . acetaminophen (TYLENOL) tablet 650 mg  650 mg Oral Q6H PRN Nira ConnBerry, Jason A, NP      . alum & mag hydroxide-simeth (MAALOX/MYLANTA) 200-200-20 MG/5ML suspension 30 mL  30 mL Oral Q4H PRN Nira ConnBerry, Jason A, NP      . LORazepam (ATIVAN) tablet 0.5 mg  0.5 mg Oral TID Nira ConnBerry, Jason A, NP   0.5 mg at 01/27/19 1034  . magnesium hydroxide (MILK OF MAGNESIA) suspension 30 mL  30 mL Oral Daily PRN Jackelyn PolingBerry, Jason A, NP      . Melene Muller[START ON 01/28/2019] OLANZapine zydis (ZYPREXA) disintegrating tablet 10 mg  10 mg Oral QHS [name], Brian, MD      . paliperidone (INVEGA) 24 hr tablet 6 mg  6 mg Oral QHS Antonieta Pertlary, Greg Lawson, MD   6 mg at 01/26/19 2052  . traZODone (DESYREL) tablet 200 mg  200 mg Oral QHS [name], Brian, MD       PTA Medications: Medications Prior to Admission  Medication Sig Dispense Refill Last Dose  . divalproex (DEPAKOTE ER) 250 MG 24 hr tablet Take 250 mg by mouth 2 (two) times daily.     . hydrOXYzine (ATARAX/VISTARIL) 25 MG tablet Take 25 mg by mouth every morning.     . benztropine (COGENTIN) 0.5 MG tablet Take 1 tablet (0.5 mg total) by mouth 2 (two) times daily. (Patient not taking: Reported on 01/21/2019) 60 tablet 2 Not Taking at Unknown time  . [EXPIRED] LORazepam (ATIVAN) 0.5 MG tablet Take 1 tablet (0.5 mg total) by mouth 3 (three) times daily for 7 days. (Patient not taking: Reported on 01/21/2019) 21 tablet 0 Not Taking at Unknown time  . risperiDONE (RISPERDAL) 3 MG tablet Take 2 tablets (6 mg total) by mouth at bedtime. (Patient not taking: Reported on 01/21/2019) 60 tablet 2 Not Taking at Unknown time  . traZODone (DESYREL) 100 MG tablet Take  2 tablets (200 mg total) by mouth at bedtime. (Patient not taking: Reported on 01/21/2019) 60 tablet 2 Not Taking at Unknown time    Patient Stressors: Marital or family conflict Medication change or noncompliance  Patient Strengths: Ability for insight Average or above average intelligence General fund of knowledge Supportive family/friends  Treatment Modalities: Medication Management, Group therapy, Case management,  1 to 1 session with clinician, Psychoeducation, Recreational therapy.   Physician Treatment Plan for Primary Diagnosis: Schizophrenia (HCC) Long Term Goal(s): Improvement in symptoms so as ready for discharge Improvement in symptoms so as ready for discharge   Short Term Goals: Ability to identify changes in lifestyle to reduce recurrence of condition will improve Ability to verbalize feelings will improve Ability to disclose and discuss suicidal ideas Ability to demonstrate self-control will improve Ability to identify and develop effective coping behaviors will improve  Medication Management: Evaluate patient's response, side effects, and tolerance of medication regimen.  Therapeutic Interventions: 1 to 1 sessions, Unit Group sessions and Medication administration.  Evaluation of Outcomes: Progressing  Physician Treatment Plan for Secondary Diagnosis: Principal Problem:   Schizophrenia (HCC)  Long Term Goal(s): Improvement in symptoms so as ready for discharge Improvement in symptoms so as ready for discharge   Short Term Goals: Ability to identify changes in lifestyle to reduce recurrence  of condition will improve Ability to verbalize feelings will improve Ability to disclose and discuss suicidal ideas Ability to demonstrate self-control will improve Ability to identify and develop effective coping behaviors will improve     Medication Management: Evaluate patient's response, side effects, and tolerance of medication regimen.  Therapeutic Interventions: 1  to 1 sessions, Unit Group sessions and Medication administration.  Evaluation of Outcomes: Progressing   RN Treatment Plan for Primary Diagnosis: Schizophrenia (HCC) Long Term Goal(s): Knowledge of disease and therapeutic regimen to maintain health will improve  Short Term Goals: Ability to participate in decision making will improve, Ability to verbalize feelings will improve, Ability to disclose and discuss suicidal ideas, Ability to identify and develop effective coping behaviors will improve and Compliance with prescribed medications will improve  Medication Management: RN will administer medications as ordered by provider, will assess and evaluate patient's response and provide education to patient for prescribed medication. RN will report any adverse and/or side effects to prescribing provider.  Therapeutic Interventions: 1 on 1 counseling sessions, Psychoeducation, Medication administration, Evaluate responses to treatment, Monitor vital signs and CBGs as ordered, Perform/monitor CIWA, COWS, AIMS and Fall Risk screenings as ordered, Perform wound care treatments as ordered.  Evaluation of Outcomes: Progressing   LCSW Treatment Plan for Primary Diagnosis: Schizophrenia (HCC) Long Term Goal(s): Safe transition to appropriate next level of care at discharge, Engage patient in therapeutic group addressing interpersonal concerns.  Short Term Goals: Engage patient in aftercare planning with referrals and resources  Therapeutic Interventions: Assess for all discharge needs, 1 to 1 time with Social worker, Explore available resources and support systems, Assess for adequacy in community support network, Educate family and significant other(s) on suicide prevention, Complete Psychosocial Assessment, Interpersonal group therapy.  Evaluation of Outcomes: Progressing   Progress in Treatment: Attending groups: Yes. Participating in groups: Yes. Taking medication as prescribed:  Yes. Toleration medication: Yes. Family/Significant other contact made: Yes, individual(s) contacted:  the patient's mother Patient understands diagnosis: Yes. Discussing patient identified problems/goals with staff: Yes. Medical problems stabilized or resolved: Yes. Denies suicidal/homicidal ideation: Yes. Issues/concerns per patient self-inventory: No. Other:   New problem(s) identified: None   New Short Term/Long Term Goal(s): medication stabilization, elimination of SI thoughts, development of comprehensive mental wellness plan.    Patient Goals:     Discharge Plan or Barriers: Patient plans to discharge home with her parents and will continue to follow up with [facility] for outpatient medication management and therapy services. CSW will continue to follow for any additional referrals and/or discharge planning.    Reason for Continuation of Hospitalization: Hallucinations Medication stabilization Suicidal ideation  Estimated Length of Stay: 3-5 days   Attendees: Patient: 01/27/2019 2:02 PM  Physician: Dr. Landry Mellow, MD 01/27/2019 2:02 PM  Nursing: Arlyss Repress.,J, RN; Diane.Leonard Schwartz, RN 01/27/2019 2:02 PM  RN Care Manager: 01/27/2019 2:02 PM  Social Worker: Baldo Daub, LCSWA 01/27/2019 2:02 PM  Recreational Therapist:  01/27/2019 2:02 PM  Other:  01/27/2019 2:02 PM  Other:  01/27/2019 2:02 PM  Other: 01/27/2019 2:02 PM    Scribe for Treatment Team: Maeola Sarah, LCSWA 01/27/2019 2:02 PM

## 2019-01-27 NOTE — Progress Notes (Signed)
Patient ID: Lindsey Richmond, female   DOB: May 10, 1996, 23 y.o.   MRN: [id number] D: Patient observed in her room lying on her bed. Pt presented with depressed mood and flat affect. Pt reported she had a good day. Pt reports her goal after discharge is continue taking her medications.  Denies SI/HI/AVH and pain.No behavioral issues noted.  A: Support and encouragement offered as needed to express needs. Medications administered as prescribed.  R: Patient is safe and cooperative on unit. Will continue to monitor  for safety and stability.

## 2019-01-27 NOTE — Progress Notes (Signed)
Pt has been cooperative. Stays in her room mostly. Completed self-inventory and indicated that she is sleeping good with good appetite. Her energy level is normal with good consentration. Denies depression, hopelessness or anxiety today and denies SI/HI. Pt reports that she is ready to get discharged. She is going to meet her goal of getting discharged by being patient.

## 2019-01-27 NOTE — Progress Notes (Signed)
Psychoeducational Group Note  Date:  01/27/2019 Time:  1105  Group Topic/Focus:  Developing a Wellness Toolbox:   The focus of this group is to help patients develop a "wellness toolbox" with skills and strategies to promote recovery upon discharge.  Participation Level: Did Not Attend  Participation Quality:  Not Applicable  Affect:  Not Applicable  Cognitive:  Not Applicable  Insight:  Not Applicable  Engagement in Group: Not Applicable  Additional Comments:  Pt refused to attend group this morning  Venba Zenner E 01/27/2019, 11:10 AM

## 2019-01-27 NOTE — Progress Notes (Signed)
Recreation Therapy Notes  Date:  4.27.20 Time: 0930 Location: [address] Dayroom  Group Topic: Stress Management  Goal Area(s) Addresses:  Patient will identify positive stress management techniques. Patient will identify benefits of using stress management post d/c.  Intervention: Stress Management  Activity :  Meditation.  LRT introduced the stress management technique of meditation.  LRT played a meditation that dealt with being resilient in the face of adversity.  Patients were to listen and follow along as meditation played.  Education:  Stress Management, Discharge Planning.   Education Outcome: Acknowledges Education  Clinical Observations/Feedback:  Pt did not attend group.     Caroll Rancher, LRT/CTRS         Lillia Abed, Victoria Euceda A 01/27/2019 11:08 AM

## 2019-01-27 NOTE — Progress Notes (Signed)
[facility] MD Progress Note  01/27/2019 12:10 PM Lindsey Richmond  MRN:  [id number] Subjective:  Patient focused on discharge so she is denying and minimizing any symptomatology we screen for denies current auditory or visual hallucinations or thoughts of harm to self or others again can contract here and if leaves no EPS or TD Principal Problem: Schizophrenia (HCC) Diagnosis: Principal Problem:   Schizophrenia (HCC)  Total Time spent with patient: 20 minutes  Past Medical History: History reviewed. No pertinent past medical history. History reviewed. No pertinent surgical history. Family History:  Family History  Problem Relation Age of Onset  . Heart failure Mother   . Hypertension Mother   . Diabetes Father    Social History:  Social History   Substance and Sexual Activity  Alcohol Use No     Social History   Substance and Sexual Activity  Drug Use No    Social History   Socioeconomic History  . Marital status: Single    Spouse name: Not on file  . Number of children: Not on file  . Years of education: Not on file  . Highest education level: Not on file  Occupational History  . Not on file  Social Needs  . Financial resource strain: Not on file  . Food insecurity:    Worry: Not on file    Inability: Not on file  . Transportation needs:    Medical: Not on file    Non-medical: Not on file  Tobacco Use  . Smoking status: Never Smoker  . Smokeless tobacco: Never Used  Substance and Sexual Activity  . Alcohol use: No  . Drug use: No  . Sexual activity: Yes    Birth control/protection: Pill  Lifestyle  . Physical activity:    Days per week: Not on file    Minutes per session: Not on file  . Stress: Not on file  Relationships  . Social connections:    Talks on phone: Not on file    Gets together: Not on file    Attends religious service: Not on file    Active member of club or organization: Not on file    Attends meetings of clubs or organizations: Not on file   Relationship status: Not on file  Other Topics Concern  . Not on file  Social History Narrative  . Not on file   Additional Social History:    Pain Medications: See d/c medication list Prescriptions: See d/c medication list Over the Counter: See d/c medication list History of alcohol / drug use?: No history of alcohol / drug abuse                    Sleep: Good  Appetite:  Good  Current Medications: Current Facility-Administered Medications  Medication Dose Route Frequency Provider Last Rate Last Dose  . acetaminophen (TYLENOL) tablet 650 mg  650 mg Oral Q6H PRN Lindsey Conn A, NP      . alum & mag hydroxide-simeth (MAALOX/MYLANTA) 200-200-20 MG/5ML suspension 30 mL  30 mL Oral Q4H PRN Lindsey Conn A, NP      . LORazepam (ATIVAN) tablet 0.5 mg  0.5 mg Oral TID Lindsey Conn A, NP   0.5 mg at 01/27/19 1034  . magnesium hydroxide (MILK OF MAGNESIA) suspension 30 mL  30 mL Oral Daily PRN Lindsey Conn A, NP      . OLANZapine zydis (ZYPREXA) disintegrating tablet 5 mg  5 mg Oral BID PRN Lindsey Pert, MD  5 mg at 01/27/19 0635  . paliperidone (INVEGA) 24 hr tablet 6 mg  6 mg Oral QHS Lindsey Richmond, Lindsey Lawson, MD   6 mg at 01/26/19 2052  . traZODone (DESYREL) tablet 100 mg  100 mg Oral QHS Lindsey Richmond, Lindsey Lawson, MD   100 mg at 01/26/19 2052    Lab Results: No results found for this or any previous visit (from the past 48 hour(s)).  Blood Alcohol level:  Lab Results  Component Value Date   ETH <10 01/13/2019   ETH <5 05/05/2017    Metabolic Disorder Labs: Lab Results  Component Value Date   HGBA1C 4.9 05/09/2017   MPG 93.93 05/09/2017   Lab Results  Component Value Date   PROLACTIN 102.0 (H) 05/09/2017   Lab Results  Component Value Date   CHOL 150 05/09/2017   TRIG 53 05/09/2017   HDL 33 (L) 05/09/2017   CHOLHDL 4.5 05/09/2017   VLDL 11 05/09/2017   LDLCALC 106 (H) 05/09/2017    Physical Findings: AIMS: Facial and Oral Movements Muscles of Facial Expression:  None, normal Lips and Perioral Area: None, normal Jaw: None, normal Tongue: None, normal,Extremity Movements Upper (arms, wrists, hands, fingers): None, normal Lower (legs, knees, ankles, toes): None, normal, Trunk Movements Neck, shoulders, hips: None, normal, Overall Severity Severity of abnormal movements (highest score from questions above): None, normal Incapacitation due to abnormal movements: None, normal Patient's awareness of abnormal movements (rate only patient's report): No Awareness, Dental Status Current problems with teeth and/or dentures?: No Does patient usually wear dentures?: No  CIWA:  CIWA-Ar Total: 0 COWS:  COWS Total Score: 1  Musculoskeletal: Strength & Muscle Tone: within normal limits Gait & Station: normal Patient leans: N/Richmond  Psychiatric Specialty Exam: Physical Exam  ROS  Blood pressure 108/61, pulse (!) 105, temperature 97.9 F (36.6 C), temperature source Oral, resp. rate 16, height 5\' 3"  (1.6 m), weight 96.6 kg, SpO2 99 %.Body mass index is 37.73 kg/m.  General Appearance: Casual  Eye Contact:  Good  Speech:  Clear and Coherent  Volume:  Decreased  Mood:  Dysphoric  Affect:  Full Range  Thought Process:  Goal Directed and Linear  Orientation:  Full (Time, Place, and Person)  Thought Content:  Logical and Tangential  Suicidal Thoughts:  No  Homicidal Thoughts:  No  Memory:  Immediate;   Good  Judgement:  Good  Insight:  Good  Psychomotor Activity:  Normal  Concentration:  Concentration: Good  Recall:  Good  Fund of Knowledge:  Good  Language:  nl  Akathisia:  Negative  Handed:  Right  AIMS (if indicated):     Assets:  Leisure Time Physical Health Resilience  ADL's:  Intact  Cognition:  WNL  Sleep:  Number of Hours: 2.5     Treatment Plan Summary: Daily contact with patient to assess and evaluate symptoms and progress in treatment, Medication management and Plan Needs sleep aid continue current antipsychotic therapy in combination  continue reality-based therapy cognitive therapy  [name],[name], MD 01/27/2019, 12:10 PM

## 2019-01-28 MED ORDER — BENZTROPINE MESYLATE 0.5 MG PO TABS: 0.5000 mg | ORAL_TABLET | Freq: Two times a day (BID) | ORAL | 1 refills | Status: DC

## 2019-01-28 MED ORDER — PALIPERIDONE ER 6 MG PO TB24: 6.0000 mg | ORAL_TABLET | Freq: Every day | ORAL | 1 refills | Status: DC

## 2019-01-28 MED ORDER — OLANZAPINE 10 MG PO TBDP: 10.0000 mg | ORAL_TABLET | Freq: Every day | ORAL | 1 refills | Status: DC

## 2019-01-28 MED ORDER — BENZTROPINE MESYLATE 0.5 MG PO TABS
0.5000 mg | ORAL_TABLET | Freq: Two times a day (BID) | ORAL | Status: DC
  Filled 2019-01-28 (×3): qty 14

## 2019-01-28 MED ORDER — PALIPERIDONE PALMITATE ER 234 MG/1.5ML IM SUSY: 234.0000 mg | PREFILLED_SYRINGE | Freq: Once | INTRAMUSCULAR | 0 refills | Status: DC
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
## 2019-01-28 NOTE — BHH Group Notes (Signed)
Adult Psychoeducational Group Note  Date:  01/28/2019 Time:  9:30 AM  Group Topic/Focus:  Goals Group:   The focus of this group is to help patients establish daily goals to achieve during treatment and discuss how the patient can incorporate goal setting into their daily lives to aide in recovery.  Participation Level:  Active  Participation Quality:  Appropriate  Affect:  Appropriate  Cognitive:  Appropriate  Insight: Appropriate  Engagement in Group:  Engaged  Modes of Intervention:  Discussion  Additional Comments:  Pt attended morning goals group.   Deforest Hoyles Paula Busenbark 01/28/2019, 9:30 AM

## 2019-01-28 NOTE — [facility] Suicide Risk Assessment (Signed)
[facility] Discharge Suicide Risk Assessment   Principal Problem: Schizophrenia [facility]) Discharge Diagnoses: Principal Problem:   Schizophrenia (HCC)   Total Time spent with patient: 45 minutes  Musculoskeletal: Strength & Muscle Tone: within normal limits Gait & Station: normal Patient leans: N/A  Psychiatric Specialty Exam: ROS  Blood pressure (!) 91/45, pulse (!) 115, temperature 98.7 F (37.1 C), temperature source Oral, resp. rate 16, height 5\' 3"  (1.6 m), weight 96.6 kg, SpO2 99 %.Body mass index is 37.73 kg/m.  General Appearance: Casual  Eye Contact::  good  Speech:  Clear and Coherent409  Volume:  Normal  Mood:  Euthymic  Affect:  Congruent and Restricted  Thought Process:  Coherent and Linear  Orientation:  Full (Time, Place, and Person)  Thought Content:  Logical and Tangential  Suicidal Thoughts:  No  Homicidal Thoughts:  No  Memory:  Immediate;   Fair  Judgement:  Intact  Insight:  Good  Psychomotor Activity:  Normal  Concentration:  Good  Recall:  Good  Fund of Knowledge:Good  Language: Good  Akathisia:  Negative  Handed:  Right  AIMS (if indicated):     Assets:  Communication Skills Desire for Improvement Financial Resources/Insurance  Sleep:  Number of Hours: 6.5  Cognition: WNL  ADL's:  Intact   Mental Status Per Nursing Assessment::   On Admission:  NA  Demographic Factors:  Low socioeconomic status  Loss Factors: NA  Historical Factors: NA  Risk Reduction Factors:   NA  Continued Clinical Symptoms:  Schizophrenia:   Less than 64 years old  Cognitive Features That Contribute To Risk:  None    Suicide Risk:  Minimal: No identifiable suicidal ideation.  Patients presenting with no risk factors but with morbid ruminations; may be classified as minimal risk based on the severity of the depressive symptoms  Follow-up Information    [facility] Follow up on 01/30/2019.   Why:  Hospital follow up appointment with Marcial Pacas is Thursday, 4/30 at  2:45p. The appointment will be held over the telephone. Marcial Pacas will contact you.  Contact information: [address] [place] [address] [phone]           Plan Of Care/Follow-up recommendations:  Activity:  full  Lindsey Zuluaga, MD 01/28/2019, 10:08 AM

## 2019-01-28 NOTE — Progress Notes (Signed)
Patient ID: Lindsey Richmond, female   DOB: 03-May-1996, 23 y.o.   MRN: [id number]  D: Pt alert and oriented on the unit.  A: Education, support, and encouragement provided. Discharge summary, medications and follow up appointments reviewed with pt. Suicide prevention resources provided, including "My 3 App." Pt's belongings in locker # 8 returned and belongings sheet signed.  R: Pt denies SI/HI, A/VH, pain, or any concerns at this time. Pt ambulatory on and off unit. Pt discharged to lobby.
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
## 2019-01-28 NOTE — Progress Notes (Signed)
Patient ID: Lindsey Richmond, female   DOB: Jan 29, 1996, 23 y.o.   MRN: [id number]  [place] NOVEL CORONAVIRUS (COVID-19) DAILY CHECK-OFF SYMPTOMS - answer yes or no to each - every day NO YES  Have you had a fever in the past 24 hours?  . Fever (Temp > 37.80C / 100F) X   Have you had any of these symptoms in the past 24 hours? . New Cough .  Sore Throat  .  Shortness of Breath .  Difficulty Breathing .  Unexplained Body Aches   X   Have you had any one of these symptoms in the past 24 hours not related to allergies?   . Runny Nose .  Nasal Congestion .  Sneezing   X   If you have had runny nose, nasal congestion, sneezing in the past 24 hours, has it worsened?  X   EXPOSURES - check yes or no X   Have you traveled outside the state in the past 14 days?  X   Have you been in contact with someone with a confirmed diagnosis of COVID-19 or PUI in the past 14 days without wearing appropriate PPE?  X   Have you been living in the same home as a person with confirmed diagnosis of COVID-19 or a PUI (household contact)?    X   Have you been diagnosed with COVID-19?    X              What to do next: Answered NO to all: Answered YES to anything:   Proceed with unit schedule Follow the [facility] Inpatient Flowsheet.

## 2019-01-28 NOTE — Progress Notes (Signed)
  [facility] Adult Case Management Discharge Plan :  Will you be returning to the same living situation after discharge:  Yes,  patient reports she is returning home with her family  At discharge, do you have transportation home?: Yes,  patient reports her mother is picking her up at discharge Do you have the ability to pay for your medications: No.  Release of information consent forms completed and in the chart;  Patient's signature needed at discharge.  Patient to Follow up at: Follow-up Information    [facility] Follow up on 01/30/2019.   Why:  Hospital follow up appointment with Marcial Pacas is Thursday, 4/30 at 2:45p. The appointment will be held over the telephone. Marcial Pacas will contact you.  Contact information: [address] [place] [address] [phone]           Next level of care provider has access to [facility] Link:yes  Safety Planning and Suicide Prevention discussed: Yes,  with the patient's mother   Have you used any form of tobacco in the last 30 days? (Cigarettes, Smokeless Tobacco, Cigars, and/or Pipes): No  Has patient been referred to the Quitline?: N/A patient is not a smoker  Patient has been referred for addiction treatment: N/A  Maeola Sarah, LCSWA 01/28/2019, 11:31 AM

## 2019-01-28 NOTE — [facility]HH Suicide Risk Assessment (Signed)
[facility] Discharge Suicide Risk Assessment   Principal Problem: Schizophrenia [facility]) Discharge Diagnoses: Principal Problem:   Schizophrenia (HCC)   Total Time spent with patient: 45 minutes  Patient reports a resolution in all positive and negative symptoms no thoughts of harming self or others no auditory or visual hallucination and has her long-acting injectable invega already on board and stable for release home at this point time no EPS or TD  Mental Status Per Nursing Assessment::   On Admission:  NA  Demographic Factors:  Low socioeconomic status  Loss Factors: NA  Historical Factors: NA  Risk Reduction Factors:   Sense of responsibility to family and Religious beliefs about death  Continued Clinical Symptoms:  Schizophrenia:   Paranoid or undifferentiated type  Cognitive Features That Contribute To Risk:  None    Suicide Risk:  Minimal: No identifiable suicidal ideation.  Patients presenting with no risk factors but with morbid ruminations; may be classified as minimal risk based on the severity of the depressive symptoms  Follow-up Information    [facility] Follow up on 01/30/2019.   Why:  Hospital follow up appointment with Marcial Pacas is Thursday, 4/30 at 2:45p. The appointment will be held over the telephone. Marcial Pacas will contact you.  Contact information: [address] [place] [address] [phone]           Plan Of Care/Follow-up recommendations:  Activity:  full  Elkin Belfield, MD 01/28/2019, 7:58 AM

## 2019-10-22 ENCOUNTER — Observation Stay (HOSPITAL_COMMUNITY)
Admission: RE | Admit: 2019-10-22 | Discharge: 2019-10-23 | Disposition: A | Discharge status: Other Acute Inpatient Hospital | Payer: Medicaid Other | Attending: Psychiatry | Admitting: Psychiatry

## 2019-10-22 ENCOUNTER — Encounter (HOSPITAL_COMMUNITY): Payer: Self-pay | Admitting: Registered Nurse

## 2019-10-22 ENCOUNTER — Other Ambulatory Visit: Payer: Self-pay

## 2019-10-22 DIAGNOSIS — Z20822 Contact with and (suspected) exposure to covid-19: Secondary | ICD-10-CM | POA: Diagnosis not present

## 2019-10-22 DIAGNOSIS — Z915 Personal history of self-harm: Secondary | ICD-10-CM | POA: Insufficient documentation

## 2019-10-22 DIAGNOSIS — Z79899 Other long term (current) drug therapy: Secondary | ICD-10-CM | POA: Diagnosis not present

## 2019-10-22 DIAGNOSIS — Z9114 Patient's other noncompliance with medication regimen: Secondary | ICD-10-CM | POA: Insufficient documentation

## 2019-10-22 DIAGNOSIS — F200 Paranoid schizophrenia: Secondary | ICD-10-CM | POA: Diagnosis not present

## 2019-10-22 DIAGNOSIS — R45851 Suicidal ideations: Secondary | ICD-10-CM | POA: Insufficient documentation

## 2019-10-22 DIAGNOSIS — F209 Schizophrenia, unspecified: Secondary | ICD-10-CM | POA: Diagnosis present

## 2019-10-22 LAB — RESPIRATORY PANEL BY RT PCR (FLU A&B, COVID)
Influenza A by PCR: NEGATIVE
Influenza B by PCR: NEGATIVE
SARS Coronavirus 2 by RT PCR: NEGATIVE

## 2019-10-22 MED ORDER — OLANZAPINE 10 MG PO TBDP
10.0000 mg | ORAL_TABLET | Freq: Every day | ORAL | Status: DC
  Administered 2019-10-22: 22:00:00 10 mg via ORAL
  Filled 2019-10-22: qty 1

## 2019-10-22 MED ORDER — BENZTROPINE MESYLATE 0.5 MG PO TABS
0.5000 mg | ORAL_TABLET | Freq: Two times a day (BID) | ORAL | Status: DC
  Administered 2019-10-22 – 2019-10-23 (×3): 0.5 mg via ORAL
  Filled 2019-10-22 (×3): qty 1
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
## 2019-10-22 NOTE — Plan of Care (Signed)
[facility] Observation Crisis Plan  Reason for Crisis Plan:  Chronic Mental Illness/Medical Illness, Crisis Stabilization and Medication Management   Plan of Care:  Referral for IOP  Family Support:    father  Current Living Environment:  Living Arrangements: Parent, Other relatives  Insurance:   Hospital Account    Name Acct ID Class Status Primary Coverage   Lindsey Richmond, Lindsey Richmond [id number] BEHAVIORAL HEALTH OBSERVATION Open None        Guarantor Account (for Hospital Account [id number])    Name Relation to Pt Service Area Active? Acct Type   [name] Self [facility] Yes Behavioral Health   Address Phone       [address] [place], [place] [address] [phone](H)          Coverage Information (for Hospital Account [id number])    Not on file      Legal Guardian:  Legal Guardian: Other:(self)  Primary Care Provider:  System, Pcp Not In  Current Outpatient Providers:  [facility]  Psychiatrist:  Name of Psychiatrist: Vesta Richmond  Counselor/Therapist:  Name of Therapist: Vesta Richmond  Compliant with Medications:  No  Additional Information:   Lindsey Richmond 1/20/20215:37 PM

## 2019-10-22 NOTE — Progress Notes (Signed)
Patient ID: Lindsey Richmond, female   DOB: April 29, 1996, 24 y.o.   MRN: [id number] Patient transferred from the 200 Hall to the 400 hall.  Affect is blunted, mood depressed, pt denies SI/HI/AVH currently, and when asked why she came to [facility] today, she stated that she was cutting herself and pointed to the right side of her neck where superficial scratches are visualized.  Pt observed to have extensive dry rash covering her entire back.  Pt with history of Schizophrenia, denies having any medical conditions, Q15 minute checks initiated for safety, will continue to monitor.

## 2019-10-22 NOTE — BH Assessment (Addendum)
Assessment Note  Lindsey Richmond is an 24 y.o. female who presented to [facility] with her father, Idaly Verret, after having been assessed at [facility] for suicidal ideation.  Vesta Mixer was in the process of placing patient on IVC when her father got upset and requested to leave with her to bring her to [facility].  Patient's father attempted to provide all the information and would not let patient speak and had to be removed from the assessment process.    Patient presents with a very flat and blunted affect.  She appears to be very depressed.  She was a very poor historian, but did admit that she held a knife to her throat with thoughts to "scarifice herself" because she states that she wanted to die.  Patient could not identify why she felt like she wanted to die.  Patient has a history of schizophrenia and was last on the psych unit in March.  She states that she was discharged from [facility] to follow-up with [facility], which she did, but states that she stopped taking her Zyprexa and Benztropine two weeks ago. Patient states that she has attempted suicide 2-3 times in the past and she she has been hospitalized on 3-4 occasions in the past for her psychosis.  Patient denies HI.  However, she appears to be responding to internal stimuli and has thought blocking.  Patient was very guarded and would not reveal any substantial information that would help the people helping her to understand what is going on with her.   Patient denies any drug or alcohol use.  She states that she has no history of abuse or self-mutilation.  Patient was alert, but again, very guarded.  Her judgment, insight and impulse control appear to be impaired.  Her thoughts were organized when she spoke, but she appeared to be thought blocking.  Her eye contact was fair to poor, her speech was soft and slow.  Diagnosis: F20.9 Schizophrenia  Past Medical History: No past medical history on file.  No past surgical history on file.  Family History:  Family  History  Problem Relation Age of Onset  . Heart failure Mother   . Hypertension Mother   . Diabetes Father     Social History:  reports that she has never smoked. She has never used smokeless tobacco. She reports that she does not drink alcohol or use drugs.  Additional Social History:  Alcohol / Drug Use Pain Medications: See d/c medication list Prescriptions: See d/c medication list Over the Counter: See d/c medication list Longest period of sobriety (when/how long): NA  CIWA:   COWS:    Allergies: No Known Allergies  Home Medications: (Not in a hospital admission)   OB/GYN Status:  No LMP recorded. (Menstrual status: Other).  General Assessment Data Assessment unable to be completed: Yes Location of Assessment: [facility] Assessment Services TTS Assessment: In system Is this a Tele or Face-to-Face Assessment?: Face-to-Face Is this an Initial Assessment or a Re-assessment for this encounter?: Initial Assessment Patient Accompanied by:: Parent Language Other than English: No Living Arrangements: Other (Comment)(lives with parents) What gender do you identify as?: Female Marital status: Single Maiden name: Callanan Pregnancy Status: No Living Arrangements: Parent Can pt return to current living arrangement?: Yes Admission Status: Voluntary Is patient capable of signing voluntary admission?: Yes Referral Source: Self/Family/Friend Insurance type: Not on File  Medical Screening Exam [facility] Walk-in ONLY) Medical Exam completed: Yes  Crisis Care Plan Living Arrangements: Parent Legal Guardian: Other:(self) Name of Psychiatrist: Vesta Mixer Name  of Therapist: Dentist Status Is patient currently in school?: No Is the patient employed, unemployed or receiving disability?: Unemployed  Risk to self with the past 6 months Suicidal Ideation: Yes-Currently Present Has patient been a risk to self within the past 6 months prior to admission? : No Suicidal Intent:  Yes-Currently Present Has patient had any suicidal intent within the past 6 months prior to admission? : No Is patient at risk for suicide?: Yes Suicidal Plan?: Yes-Currently Present Has patient had any suicidal plan within the past 6 months prior to admission? : Yes Specify Current Suicidal Plan: cut throat Access to Means: Yes Specify Access to Suicidal Means: with knife What has been your use of drugs/alcohol within the last 12 months?: none Previous Attempts/Gestures: No How many times?: 0 Other Self Harm Risks: (UTA) Triggers for Past Attempts: None known Intentional Self Injurious Behavior: None Family Suicide History: No Recent stressful life event(s): Other (Comment)(UTA) Persecutory voices/beliefs?: No Depression: Yes Depression Symptoms: Despondent, Isolating, Loss of interest in usual pleasures Substance abuse history and/or treatment for substance abuse?: No Suicide prevention information given to non-admitted patients: Not applicable  Risk to Others within the past 6 months Homicidal Ideation: No Does patient have any lifetime risk of violence toward others beyond the six months prior to admission? : No Thoughts of Harm to Others: No Current Homicidal Intent: No-Not Currently/Within Last 6 Months Current Homicidal Plan: No Access to Homicidal Means: No Identified Victim: none History of harm to others?: No Assessment of Violence: None Noted Violent Behavior Description: none Does patient have access to weapons?: No Criminal Charges Pending?: No Does patient have a court date: No Is patient on probation?: No  Psychosis Hallucinations: Auditory(pt appeared to be responding to internal stimuli) Delusions: None noted  Mental Status Report Appearance/Hygiene: Disheveled, Poor hygiene Eye Contact: Poor Motor Activity: Psychomotor retardation Speech: Soft, Slow Level of Consciousness: Alert Mood: Depressed Affect: Blunted, Depressed, Flat Anxiety Level:  Minimal Thought Processes: Thought Blocking Judgement: Impaired Orientation: Person, Place, Time, Situation Obsessive Compulsive Thoughts/Behaviors: None  Cognitive Functioning Concentration: Decreased Memory: Recent Intact, Remote Intact Is patient IDD: No Insight: Poor Impulse Control: Poor Appetite: (UTA) Sleep: (UTA) Vegetative Symptoms: Decreased grooming  ADLScreening [facility] Assessment Services) Patient's cognitive ability adequate to safely complete daily activities?: Yes Patient able to express need for assistance with ADLs?: Yes Independently performs ADLs?: Yes (appropriate for developmental age)  Prior Inpatient Therapy Prior Inpatient Therapy: Yes Prior Therapy Dates: 01/2019 Prior Therapy Facilty/Provider(s): [facility] Reason for Treatment: Psychosis  Prior Outpatient Therapy Prior Outpatient Therapy: Yes Prior Therapy Dates: active Prior Therapy Facilty/Provider(s): [facility] Reason for Treatment: psychosis Does patient have an ACCT team?: No Does patient have Intensive In-House Services?  : No Does patient have [facility] services? : No Does patient have P4CC services?: No  ADL Screening (condition at time of admission) Patient's cognitive ability adequate to safely complete daily activities?: Yes Is the patient deaf or have difficulty hearing?: No Does the patient have difficulty seeing, even when wearing glasses/contacts?: No Does the patient have difficulty concentrating, remembering, or making decisions?: No Patient able to express need for assistance with ADLs?: Yes Does the patient have difficulty dressing or bathing?: No Independently performs ADLs?: Yes (appropriate for developmental age) Does the patient have difficulty walking or climbing stairs?: No Weakness of Legs: None Weakness of Arms/Hands: None  Home Assistive Devices/Equipment Home Assistive Devices/Equipment: None  Therapy Consults (therapy consults require a physician order) PT Evaluation  Needed: No OT Evalulation Needed: No  SLP Evaluation Needed: No Abuse/Neglect Assessment (Assessment to be complete while patient is alone) Abuse/Neglect Assessment Can Be Completed: Yes Physical Abuse: Denies Verbal Abuse: Denies Sexual Abuse: Denies Exploitation of patient/patient's resources: Denies Self-Neglect: Denies Values / Beliefs Cultural Requests During Hospitalization: None Spiritual Requests During Hospitalization: None Consults Spiritual Care Consult Needed: No Transition of Care Team Consult Needed: No Advance Directives (For Healthcare) Does Patient Have a Medical Advance Directive?: No Would patient like information on creating a medical advance directive?: No - Patient declined Nutrition Screen- [facility] Adult/WL/AP Has the patient recently lost weight without trying?: No Has the patient been eating poorly because of a decreased appetite?: No Malnutrition Screening Tool Score: 0        Disposition: Per Shuvon Rankin, NP, Inpatient is recommended Disposition Initial Assessment Completed for this Encounter: Yes Disposition of Patient: Admit  On Site Evaluation by:   Reviewed with Physician:    Arnoldo Lenis Man Effertz 10/22/2019 2:20 PM

## 2019-10-22 NOTE — H&P (Signed)
BH Observation Unit Provider Admission PAA/H&P  Patient Identification: Lindsey Richmond MRN:  [id number] Date of Evaluation:  10/22/2019 Chief Complaint:  Schizophrenia (HCC) [F20.9] Principal Diagnosis: Schizophrenia, paranoid (HCC) Diagnosis:  Principal Problem:   Schizophrenia, paranoid (HCC) Active Problems:   Schizophrenia (HCC)  History of Present Illness: Lindsey Richmond, 24 y.o., female patient presented to [facility] as a walk in brought in by her father after leaving [facility].  Patient seen face to face by this provider, consulted with  Dr. Lucianne Muss; and chart reviewed on 10/22/19.  On evaluation Lindsey Richmond reports that she wanted to kill herself and put a knife to her throat because she wanted to be a scarface for Jesus.  Patient stats that she has attempted suicide at least twice before and has had an psychiatric hospitalization at least 4 times.  Patient denies homicidal ideation, psychosis, and paranoia.  Patient also denies a history of self-harm.  Patient father at sided and states that this is the second time with in several days that patient has attempted to hurt herself.  States that the patients brother informed that patient has been talking a lot about [employer] and sacrificing.     During evaluation Lindsey Richmond is alert/oriented x 3; calm/cooperative; affect is flat and some thought blocking.  Reports she has been off of her medication Zyprexa and Cogentin for about weeks.  Patient unable to contract for safety of if there are any stressors at home.    Associated Signs/Symptoms: Depression Symptoms:  depressed mood, suicidal thoughts with specific plan, (Hypo) Manic Symptoms:  Delusions, Impulsivity, Anxiety Symptoms:  Denies Psychotic Symptoms:  Delusions, Paranoia, PTSD Symptoms: Patient unable to give information at this time Total Time spent with patient: 30 minutes  Past Psychiatric History: Schizophrenia   Is the patient at risk to self? Yes.    Has the patient been a  risk to self in the past 6 months? Yes.    Has the patient been a risk to self within the distant past? Yes.    Is the patient a risk to others? No.  Has the patient been a risk to others in the past 6 months? No.  Has the patient been a risk to others within the distant past? No.   Prior Inpatient Therapy: Prior Inpatient Therapy: (P) Yes Prior Therapy Dates: (P) 01/2019 Prior Therapy Facilty/Provider(s): (P) [facility] Reason for Treatment: (P) PsychosisYes Prior Outpatient Therapy: Prior Outpatient Therapy: (P) YesYes  Alcohol Screening:   Substance Abuse History in the last 12 months:  No. Consequences of Substance Abuse: NA Previous Psychotropic Medications: Yes  Psychological Evaluations: Yes  Past Medical History: No past medical history on Richmond. No past surgical history on Richmond. Family History:  Family History  Problem Relation Age of Onset  . Heart failure Mother   . Hypertension Mother   . Diabetes Father    Family Psychiatric History: Unaware Tobacco Screening:   Social History:  Social History   Substance and Sexual Activity  Alcohol Use No     Social History   Substance and Sexual Activity  Drug Use No    Additional Social History: Marital status: Single    Pain Medications: See d/c medication list Prescriptions: See d/c medication list Over the Counter: See d/c medication list Longest period of sobriety (when/how long): NA    Allergies:  No Known Allergies Lab Results: No results found for this or any previous visit (from the past 48 hour(s)).  Blood Alcohol level:  Lab Results  Component Value Date   ETH <10 01/13/2019   ETH <5 40/05/6760    Metabolic Disorder Labs:  Lab Results  Component Value Date   HGBA1C 4.9 05/09/2017   MPG 93.93 05/09/2017   Lab Results  Component Value Date   PROLACTIN 102.0 (H) 05/09/2017   Lab Results  Component Value Date   CHOL 150 05/09/2017   TRIG 53 05/09/2017   HDL 33 (L) 05/09/2017   CHOLHDL 4.5  05/09/2017   VLDL 11 05/09/2017   LDLCALC 106 (H) 05/09/2017    Current Medications: Current Outpatient Medications  Medication Sig Dispense Refill  . benztropine (COGENTIN) 0.5 MG tablet Take 1 tablet (0.5 mg total) by mouth 2 (two) times daily. 180 tablet 1  . OLANZapine zydis (ZYPREXA) 10 MG disintegrating tablet Take 1 tablet (10 mg total) by mouth at bedtime. 90 tablet 1  . paliperidone (INVEGA SUSTENNA) 234 MG/1.5ML SUSY injection Inject 234 mg into the muscle once for 1 dose. Due approx 5/23 1.5 mL 0  . paliperidone (INVEGA) 6 MG 24 hr tablet Take 1 tablet (6 mg total) by mouth at bedtime. 90 tablet 1   Current Facility-Administered Medications  Medication Dose Route Frequency Provider Last Rate Last Admin  . benztropine (COGENTIN) tablet 0.5 mg  0.5 mg Oral BID Jahshua Bonito B, NP      . OLANZapine zydis (ZYPREXA) disintegrating tablet 10 mg  10 mg Oral QHS Jazon Jipson B, NP       PTA Medications: (Not in a hospital admission)   Musculoskeletal: Strength & Muscle Tone: within normal limits Gait & Station: normal Patient leans: N/A  Psychiatric Specialty Exam: Physical Exam  Review of Systems  There were no vitals taken for this visit.There is no height or weight on Richmond to calculate BMI.  General Appearance: Casual  Eye Contact:  Minimal  Speech:  Clear and Coherent and Normal Rate  Volume:  Decreased  Mood:  Depressed  Affect:  Depressed and Flat  Thought Process:  Linear and Descriptions of Associations: Circumstantial  Orientation:  Full (Time, Place, and Person)  Thought Content:  Delusions and Paranoid Ideation  Suicidal Thoughts:  Yes.  with intent/plan  Homicidal Thoughts:  No  Memory:  Immediate;   Poor Recent;   Poor  Judgement:  Impaired  Insight:  Lacking  Psychomotor Activity:  Decreased  Concentration:  Concentration: Poor and Attention Span: Poor  Recall:  Poor  Fund of Knowledge:  Fair  Language:  Fair  Akathisia:  No  Handed:  Right   AIMS (if indicated):     Assets:  Communication Skills Desire for Improvement Housing Social Support  ADL's:  Intact  Cognition:  WNL and Impaired,  Mild  Sleep:         Treatment Plan Summary: Daily contact with patient to assess and evaluate symptoms and progress in treatment, Medication management and Plan Observe overnight in observaiton and reassess tomorrow   Observation Level/Precautions:  15 minute checks Laboratory:  CBC Chemistry Profile HbAIC UDS UA TSH, Lipid profile, Pregnancy Psychotherapy:  Individual Medications:  Restarted home medications Consultations:  As needed Discharge Concerns:  Safety, stabilization, and access to medication Estimated LOS: 24 hours Other:      Dakari Stabler, NP 1/20/20212:19 PM

## 2019-10-22 NOTE — Progress Notes (Addendum)
Pt accepted to [facility] Adult Unit; [address].      Dr. Estill Cotta is the accepting and attending provider.   Call report to [phone].   NP notified.    Pt is IVC.   Pt may be transported by [employer].   Pt scheduled to arrive on 10/23/2019 after 8:00am; Pending Negative COVID Screen.   Drucilla Schmidt, MSW, LCSW-A Clinical Disposition Social Worker [employer] [facility]/TTS [phone]

## 2019-10-22 NOTE — Progress Notes (Signed)
Patient meets inpatient criteria per Assunta Found, NP. Patient has been faxed out to the following facilities for review:   [facility]  [facility]  [facility]    [facility]   [facility]   [facility]  [facility]    [facility]    [facility]   [facility]  [facility]   [facility]   [facility]    [facility]  CSW will continue to follow and assist with disposition planning.   Drucilla Schmidt, MSW, LCSW-A Clinical Disposition Social Worker [facility]/TTS [phone]

## 2019-10-23 DIAGNOSIS — F200 Paranoid schizophrenia: Secondary | ICD-10-CM | POA: Diagnosis not present

## 2019-10-23 LAB — URINALYSIS, COMPLETE (UACMP) WITH MICROSCOPIC
Bilirubin Urine: NEGATIVE
Glucose, UA: NEGATIVE mg/dL
Ketones, ur: 5 mg/dL — AB
Nitrite: NEGATIVE
Protein, ur: 100 mg/dL — AB
RBC / HPF: >50 RBC/hpf — ABNORMAL HIGH (ref 0–5)
Specific Gravity, Urine: 1.036 — ABNORMAL HIGH (ref 1.005–1.030)
pH: 5 (ref 5.0–8.0)

## 2019-10-23 LAB — CBC
HCT: 42.1 % (ref 36.0–46.0)
Hemoglobin: 13.8 g/dL (ref 12.0–15.0)
MCH: 31 pg (ref 26.0–34.0)
MCHC: 32.8 g/dL (ref 30.0–36.0)
MCV: 94.6 fL (ref 80.0–100.0)
Platelets: 292 10*3/uL (ref 150–400)
RBC: 4.45 MIL/uL (ref 3.87–5.11)
RDW: 12.6 % (ref 11.5–15.5)
WBC: 5.6 10*3/uL (ref 4.0–10.5)
nRBC: 0 % (ref 0.0–0.2)

## 2019-10-23 LAB — HEMOGLOBIN A1C
Hgb A1c MFr Bld: 5.3 % (ref 4.8–5.6)
Mean Plasma Glucose: 105.41 mg/dL

## 2019-10-23 LAB — COMPREHENSIVE METABOLIC PANEL
ALT: 17 U/L (ref 0–44)
AST: 16 U/L (ref 15–41)
Albumin: 4.1 g/dL (ref 3.5–5.0)
Alkaline Phosphatase: 51 U/L (ref 38–126)
Anion gap: 12 (ref 5–15)
BUN: 16 mg/dL (ref 6–20)
CO2: 24 mmol/L (ref 22–32)
Calcium: 9.4 mg/dL (ref 8.9–10.3)
Chloride: 102 mmol/L (ref 98–111)
Creatinine, Ser: 0.88 mg/dL (ref 0.44–1.00)
GFR calc Af Amer: >60 mL/min (ref >60)
GFR calc non Af Amer: >60 mL/min (ref >60)
Glucose, Bld: 101 mg/dL — ABNORMAL HIGH (ref 70–99)
Potassium: 3.8 mmol/L (ref 3.5–5.1)
Sodium: 138 mmol/L (ref 135–145)
Total Bilirubin: 0.7 mg/dL (ref 0.3–1.2)
Total Protein: 9.4 g/dL — ABNORMAL HIGH (ref 6.5–8.1)

## 2019-10-23 LAB — RAPID URINE DRUG SCREEN, HOSP PERFORMED
Amphetamines: NOT DETECTED
Barbiturates: NOT DETECTED
Benzodiazepines: NOT DETECTED
Cocaine: NOT DETECTED
Opiates: NOT DETECTED
Tetrahydrocannabinol: NOT DETECTED

## 2019-10-23 LAB — TSH: TSH: 2.494 u[IU]/mL (ref 0.350–4.500)

## 2019-10-23 LAB — PREGNANCY, URINE: Preg Test, Ur: NEGATIVE

## 2019-10-23 LAB — LIPID PANEL
Cholesterol: 135 mg/dL (ref 0–200)
HDL: 26 mg/dL — ABNORMAL LOW (ref >40)
LDL Cholesterol: 102 mg/dL — ABNORMAL HIGH (ref 0–99)
Total CHOL/HDL Ratio: 5.2 RATIO
Triglycerides: 37 mg/dL (ref <150)
VLDL: 7 mg/dL (ref 0–40)

## 2019-10-23 LAB — ETHANOL: Alcohol, Ethyl (B): <10 mg/dL (ref <10)

## 2019-10-23 NOTE — Progress Notes (Addendum)
D: Pt A & O X 3. Presents with Denies SI, HI, AVH and pain at this time. D/C home as ordered. Picked up in lobby by  A: D/C instructions reviewed with pt including prescriptions, medication samples and follow up appointment, compliance encouraged. All belongings from locker 38 returned to pt at time of departure. Scheduled medication given with verbal education and effects monitored. Report called to receiving nurse Vernona Rieger, RN at [facility] at [phone]. Q 15 minutes safety checks maintained without incident till time of d/c.  R: Pt receptive to care. Compliant with medications when offered. Denies adverse drug reactions when assessed. Verbalized understanding related to d/c instructions. Signed belonging sheet in agreement with items received from locker. Ambulatory with a steady gait. Appears to be in no physical distress at time of departure.

## 2019-10-23 NOTE — BH Assessment (Signed)
[facility] Assessment Progress Note This voluntary pt has reportedly been accepted to [facility].  For details, please refer to note authored by [employer] on 10/22/19 at 18:53.  Pt's nurse, Lincoln Maxin, has been notified.  Doylene Canning, [place] Behavioral Health Coordinator [phone]

## 2019-10-24 LAB — PROLACTIN: Prolactin: 75.7 ng/mL — ABNORMAL HIGH (ref 4.8–23.3)

## 2019-11-04 NOTE — Discharge Summary (Signed)
Obs discharge summary  24 year old female, brought to [facility] by her father.  Patient reported suicidal ideations and reportedly had put a knife to her throat, exhibited psychotic/hyper religious ideations expressing thoughts about being a sacrifice for Jesus.  History of schizophrenia, reportedly had been off her psychiatric medications. Please monitor to knobs and discharge on 1/21 to [facility] for inpatient psychiatric care. Sallyanne Havers, MD

## 2019-12-03 ENCOUNTER — Encounter (HOSPITAL_COMMUNITY): Payer: Self-pay | Admitting: Registered Nurse

## 2019-12-03 ENCOUNTER — Other Ambulatory Visit: Payer: Self-pay

## 2019-12-03 ENCOUNTER — Inpatient Hospital Stay (HOSPITAL_COMMUNITY)
Admission: RE | Admit: 2019-12-03 | Discharge: 2019-12-09 | DRG: 885 | Disposition: A | Discharge status: Home / Self Care | Payer: Medicaid Other | Attending: Psychiatry | Admitting: Psychiatry

## 2019-12-03 DIAGNOSIS — R41843 Psychomotor deficit: Secondary | ICD-10-CM | POA: Diagnosis present

## 2019-12-03 DIAGNOSIS — F200 Paranoid schizophrenia: Principal | ICD-10-CM | POA: Diagnosis present

## 2019-12-03 DIAGNOSIS — R45851 Suicidal ideations: Secondary | ICD-10-CM | POA: Diagnosis present

## 2019-12-03 DIAGNOSIS — F329 Major depressive disorder, single episode, unspecified: Secondary | ICD-10-CM | POA: Diagnosis present

## 2019-12-03 DIAGNOSIS — Z20822 Contact with and (suspected) exposure to covid-19: Secondary | ICD-10-CM | POA: Diagnosis present

## 2019-12-03 DIAGNOSIS — F209 Schizophrenia, unspecified: Secondary | ICD-10-CM | POA: Diagnosis present

## 2019-12-03 LAB — RESPIRATORY PANEL BY RT PCR (FLU A&B, COVID)
Influenza A by PCR: NEGATIVE
Influenza B by PCR: NEGATIVE
SARS Coronavirus 2 by RT PCR: NEGATIVE

## 2019-12-03 MED ORDER — BENZTROPINE MESYLATE 0.5 MG PO TABS
0.5000 mg | ORAL_TABLET | Freq: Two times a day (BID) | ORAL | Status: DC
  Administered 2019-12-03 – 2019-12-08 (×10): 0.5 mg via ORAL
  Filled 2019-12-03 (×16): qty 1

## 2019-12-03 MED ORDER — HYDROXYZINE HCL 25 MG PO TABS
25.0000 mg | ORAL_TABLET | Freq: Three times a day (TID) | ORAL | Status: DC | PRN
  Filled 2019-12-03: qty 1

## 2019-12-03 MED ORDER — TRAZODONE HCL 50 MG PO TABS
50.0000 mg | ORAL_TABLET | Freq: Every evening | ORAL | Status: DC | PRN
  Filled 2019-12-03: qty 1

## 2019-12-03 MED ORDER — OLANZAPINE 5 MG PO TBDP
5.0000 mg | ORAL_TABLET | Freq: Every day | ORAL | Status: DC
  Administered 2019-12-03: 5 mg via ORAL
  Filled 2019-12-03 (×4): qty 1

## 2019-12-03 NOTE — Progress Notes (Signed)
Lindsey Richmond is a 24 y.o. female Voluntary admitted for suicide ideation with a plan to cut her throat. Pt stated she does not know what triggers are. Pt's thought blocking and takes time to respond to questions. Pt denied AVH but appeared to responding to internal stimuli, denied SI/HI and contracted for safety. Pt refused the writer to perform skin search and receiving nurse informed.  Belongings reviewed and noted on belongings sheet.  Will continue to monitor.

## 2019-12-03 NOTE — BH Assessment (Signed)
Assessment Note  Lindsey Richmond is an 24 y.o. female presenting voluntarily to [facility] for assessment. Patient is accompanied by her brother, [name], who is present for assessment and provides collateral information. Patient is a poor historian. She appears to be thought blocking and struggles to complete assessment. She states today she tried to cut her own throat. She is unable to identify a trigger. She denies HI/AVH, however appears to be responding to internal stimuli during assessment. She denies any substance use or trauma history.   Per patient's brother, [name]: Patient has a history of psychosis and was recently hospitalized at [facility]. She receives medication management through [place] but he feels she needs more. He states today he found her laying in her bed with a knife next to her stating she wanted to kill herself. Patient lives with their parents, but their mother has MS and has difficulty caring for her. Patient is religiously preoccupied and makes statements that "God is coming." He states her religous preoccupation, along with loneliness, are triggers for her SI.  Patient is alert and oriented x 2. Her speech is soft, eye contact is poor, and she appears to be thought blocking. Her mood is depressed and her affect is flat. She has poor insight, judgement, and impulse control. She appears to be responding to internal stimuli.   Diagnosis: F20.9 Schizophrenia   F33.2 MDD recurrent, severe  Past Medical History: No past medical history on file.  No past surgical history on file.  Family History:  Family History  Problem Relation Age of Onset  . Heart failure Mother   . Hypertension Mother   . Diabetes Father     Social History:  reports that she has never smoked. She has never used smokeless tobacco. She reports that she does not drink alcohol or use drugs.  Additional Social History:     CIWA: CIWA-Ar BP: 131/73 Pulse Rate: (!) 103 COWS:    Allergies: No Known  Allergies  Home Medications: (Not in a hospital admission)   OB/GYN Status:  No LMP recorded. (Menstrual status: Other).  General Assessment Data Location of Assessment: [facility] Assessment Services TTS Assessment: In system Is this a Tele or Face-to-Face Assessment?: Face-to-Face Is this an Initial Assessment or a Re-assessment for this encounter?: Initial Assessment Patient Accompanied by:: Parent Language Other than English: No Living Arrangements: Other (Comment) What gender do you identify as?: Female Marital status: Single Maiden name: Shuford Pregnancy Status: No Living Arrangements: Parent, Other relatives Can pt return to current living arrangement?: Yes Admission Status: Voluntary Is patient capable of signing voluntary admission?: Yes Referral Source: Self/Family/Friend Insurance type: none  Medical Screening Exam [facility] Walk-in ONLY) Medical Exam completed: Yes  Crisis Care Plan Living Arrangements: Parent, Other relatives Legal Guardian: (self) Name of Psychiatrist: [facility] Name of Therapist: [facility]  Education Status Is patient currently in school?: No Is the patient employed, unemployed or receiving disability?: Unemployed  Risk to self with the past 6 months Suicidal Ideation: Yes-Currently Present Has patient been a risk to self within the past 6 months prior to admission? : Yes Suicidal Intent: No-Not Currently/Within Last 6 Months Has patient had any suicidal intent within the past 6 months prior to admission? : Yes Is patient at risk for suicide?: Yes Suicidal Plan?: No-Not Currently/Within Last 6 Months Has patient had any suicidal plan within the past 6 months prior to admission? : Yes Specify Current Suicidal Plan: cut throat Access to Means: Yes Specify Access to Suicidal Means: witth knife What  has been your use of drugs/alcohol within the last 12 months?: none Previous Attempts/Gestures: No How many times?: 0 Other Self Harm Risks: none  noted Triggers for Past Attempts: None known Intentional Self Injurious Behavior: None Family Suicide History: No Recent stressful life event(s): Other (Comment)(hospitalizations) Persecutory voices/beliefs?: No Depression: Yes Depression Symptoms: Despondent, Insomnia, Tearfulness, Fatigue, Isolating, Guilt, Loss of interest in usual pleasures, Feeling worthless/self pity, Feeling angry/irritable Substance abuse history and/or treatment for substance abuse?: No Suicide prevention information given to non-admitted patients: Not applicable  Risk to Others within the past 6 months Homicidal Ideation: No Does patient have any lifetime risk of violence toward others beyond the six months prior to admission? : No Thoughts of Harm to Others: No Current Homicidal Intent: No Current Homicidal Plan: No Access to Homicidal Means: No Identified Victim: none History of harm to others?: No Assessment of Violence: None Noted Violent Behavior Description: none Does patient have access to weapons?: No Criminal Charges Pending?: No Does patient have a court date: No Is patient on probation?: No  Psychosis Hallucinations: Auditory(appeared to be responding to internal stimuli) Delusions: (religious)  Mental Status Report Appearance/Hygiene: Disheveled Eye Contact: Poor Motor Activity: Psychomotor retardation Speech: Soft, Slow Level of Consciousness: Alert Mood: Depressed Affect: Flat Anxiety Level: None Thought Processes: Thought Blocking Judgement: Impaired Orientation: Person, Place, Time, Situation Obsessive Compulsive Thoughts/Behaviors: None  Cognitive Functioning Concentration: Poor Memory: Unable to Assess Is patient IDD: No Insight: Poor Impulse Control: Poor Appetite: Good Have you had any weight changes? : No Change Sleep: Unable to Assess Total Hours of Sleep: (UTA) Vegetative Symptoms: None  ADLScreening [facility] Assessment Services) Patient's cognitive ability  adequate to safely complete daily activities?: Yes Patient able to express need for assistance with ADLs?: Yes Independently performs ADLs?: Yes (appropriate for developmental age)  Prior Inpatient Therapy Prior Inpatient Therapy: Yes Prior Therapy Dates: 01/2019, 10/2019 Prior Therapy Facilty/Provider(s): [facility], [facility] Reason for Treatment: Psychosis  Prior Outpatient Therapy Prior Outpatient Therapy: Yes Prior Therapy Dates: active Prior Therapy Facilty/Provider(s): [facility] Reason for Treatment: psychosis Does patient have an ACCT team?: No Does patient have Intensive In-House Services?  : No Does patient have [facility] services? : No Does patient have P4CC services?: No  ADL Screening (condition at time of admission) Patient's cognitive ability adequate to safely complete daily activities?: Yes Is the patient deaf or have difficulty hearing?: No Does the patient have difficulty seeing, even when wearing glasses/contacts?: No Does the patient have difficulty concentrating, remembering, or making decisions?: No Patient able to express need for assistance with ADLs?: Yes Does the patient have difficulty dressing or bathing?: No Independently performs ADLs?: Yes (appropriate for developmental age) Does the patient have difficulty walking or climbing stairs?: No Weakness of Legs: None Weakness of Arms/Hands: None  Home Assistive Devices/Equipment Home Assistive Devices/Equipment: None  Therapy Consults (therapy consults require a physician order) PT Evaluation Needed: No OT Evalulation Needed: No SLP Evaluation Needed: No Abuse/Neglect Assessment (Assessment to be complete while patient is alone) Physical Abuse: Denies Verbal Abuse: Yes, past (Comment)(in school) Sexual Abuse: Denies Exploitation of patient/patient's resources: Denies Self-Neglect: Denies, provider concerned (Comment) Values / Beliefs Cultural Requests During Hospitalization: None Spiritual Requests During  Hospitalization: None Consults Spiritual Care Consult Needed: No Transition of Care Team Consult Needed: No Advance Directives (For Healthcare) Does Patient Have a Medical Advance Directive?: No Would patient like information on creating a medical advance directive?: No - Patient declined          Disposition: Per  Assunta Found, NP patient meets in patient criteria. Patient accepted to [facility] (400 or 500 hall) pending a negative Covid test. Disposition Initial Assessment Completed for this Encounter: Yes Disposition of Patient: Admit Type of inpatient treatment program: Adult  On Site Evaluation by:   Reviewed with Physician:    Celedonio Miyamoto 12/03/2019 6:53 PM

## 2019-12-03 NOTE — H&P (Signed)
Behavioral Health Medical Screening Exam  Lindsey Richmond is an 24 y.o. female patient presents to [facility] as a walk in accompanied by her brother with complaints that patient was found with a knife in bed and has made suicidal comment that she would slit her throat.  But denies suicidal ideation at this time.  Patient has psychiatric outpatient services with [facility] and last visit was around Nov 25, 2019 when received paliperidone palmitate injection.  Patient states that she did not attempt to cut her throat but had the knife in bed with her with the intention of cutting her throat.  States she is feeling worse but did not elaborate.  Patient answering yes/no to most questions with apparent thought blocking.  Patient also endorses auditory/visual hallucination but no elaboration.  Patient recommended for inpatient psychiatric treatment.   Negative symptoms seems to be slowed ; limited action that could be related to schizophrenia, depression or EPS of medication.    Total Time spent with patient: 30 minutes  Psychiatric Specialty Exam: Physical Exam  Review of Systems  Blood pressure 131/73, pulse (!) 103, temperature 98.3 F (36.8 C), temperature source Oral, resp. rate 18, SpO2 100 %.There is no height or weight on file to calculate BMI.  General Appearance: Casual  Eye Contact:  Good  Speech:  Blocked, Clear and Coherent and Slow  Volume:  Normal  Mood:  Depressed  Affect:  Depressed and Flat  Thought Process:  Coherent and Linear  Orientation:  Full (Time, Place, and Person)  Thought Content:  Hallucinations: Auditory Visual  Suicidal Thoughts:  Yes.  with intent/plan  Homicidal Thoughts:  No  Memory:  Immediate;   Poor Recent;   Poor  Judgement:  Impaired  Insight:  Fair  Psychomotor Activity:  Decreased  Concentration: Concentration: Poor and Attention Span: Poor  Recall:  Poor  Fund of Knowledge:Fair  Language: Fair  Akathisia:  Possible  Handed:  Right  AIMS (if indicated):      Assets:  Communication Skills Desire for Improvement Housing Social Support  Sleep:       Musculoskeletal: Strength & Muscle Tone: within normal limits Gait & Station: normal Patient leans: N/A  Blood pressure 131/73, pulse (!) 103, temperature 98.3 F (36.8 C), temperature source Oral, resp. rate 18, SpO2 100 %.  Recommendations:  Inpatient psychiatric treatment; Accepted to [facility]  Based on my evaluation the patient does not appear to have an emergency medical condition.  Charmain Diosdado, NP 12/03/2019, 6:21 PM

## 2019-12-03 NOTE — Progress Notes (Signed)
Pt accepted to bed 507-01

## 2019-12-04 DIAGNOSIS — Z20822 Contact with and (suspected) exposure to covid-19: Secondary | ICD-10-CM | POA: Diagnosis present

## 2019-12-04 DIAGNOSIS — F200 Paranoid schizophrenia: Principal | ICD-10-CM

## 2019-12-04 DIAGNOSIS — R41843 Psychomotor deficit: Secondary | ICD-10-CM | POA: Diagnosis present

## 2019-12-04 DIAGNOSIS — F329 Major depressive disorder, single episode, unspecified: Secondary | ICD-10-CM | POA: Diagnosis present

## 2019-12-04 DIAGNOSIS — R45851 Suicidal ideations: Secondary | ICD-10-CM | POA: Diagnosis present

## 2019-12-04 DIAGNOSIS — F209 Schizophrenia, unspecified: Secondary | ICD-10-CM | POA: Diagnosis not present

## 2019-12-04 LAB — HEMOGLOBIN A1C
Hgb A1c MFr Bld: 5.3 % (ref 4.8–5.6)
Mean Plasma Glucose: 105.41 mg/dL

## 2019-12-04 LAB — COMPREHENSIVE METABOLIC PANEL
ALT: 15 U/L (ref 0–44)
AST: 14 U/L — ABNORMAL LOW (ref 15–41)
Albumin: 3.6 g/dL (ref 3.5–5.0)
Alkaline Phosphatase: 53 U/L (ref 38–126)
Anion gap: 10 (ref 5–15)
BUN: 14 mg/dL (ref 6–20)
CO2: 25 mmol/L (ref 22–32)
Calcium: 9 mg/dL (ref 8.9–10.3)
Chloride: 103 mmol/L (ref 98–111)
Creatinine, Ser: 0.85 mg/dL (ref 0.44–1.00)
GFR calc Af Amer: >60 mL/min (ref >60)
GFR calc non Af Amer: >60 mL/min (ref >60)
Glucose, Bld: 90 mg/dL (ref 70–99)
Potassium: 3.6 mmol/L (ref 3.5–5.1)
Sodium: 138 mmol/L (ref 135–145)
Total Bilirubin: 0.8 mg/dL (ref 0.3–1.2)
Total Protein: 8.9 g/dL — ABNORMAL HIGH (ref 6.5–8.1)

## 2019-12-04 LAB — CBC
HCT: 42 % (ref 36.0–46.0)
Hemoglobin: 13.6 g/dL (ref 12.0–15.0)
MCH: 30.6 pg (ref 26.0–34.0)
MCHC: 32.4 g/dL (ref 30.0–36.0)
MCV: 94.6 fL (ref 80.0–100.0)
Platelets: 297 10*3/uL (ref 150–400)
RBC: 4.44 MIL/uL (ref 3.87–5.11)
RDW: 12.5 % (ref 11.5–15.5)
WBC: 6.5 10*3/uL (ref 4.0–10.5)
nRBC: 0 % (ref 0.0–0.2)

## 2019-12-04 LAB — LIPID PANEL
Cholesterol: 133 mg/dL (ref 0–200)
HDL: 25 mg/dL — ABNORMAL LOW (ref >40)
LDL Cholesterol: 100 mg/dL — ABNORMAL HIGH (ref 0–99)
Total CHOL/HDL Ratio: 5.3 RATIO
Triglycerides: 40 mg/dL (ref <150)
VLDL: 8 mg/dL (ref 0–40)

## 2019-12-04 MED ORDER — OLANZAPINE 5 MG PO TBDP
5.0000 mg | ORAL_TABLET | Freq: Every day | ORAL | Status: DC
  Administered 2019-12-04 – 2019-12-09 (×6): 5 mg via ORAL
  Filled 2019-12-04 (×8): qty 1

## 2019-12-04 MED ORDER — OLANZAPINE 10 MG PO TBDP
10.0000 mg | ORAL_TABLET | Freq: Every day | ORAL | Status: DC
  Administered 2019-12-04 – 2019-12-08 (×5): 10 mg via ORAL
  Filled 2019-12-04 (×3): qty 1
  Filled 2019-12-04: qty 7
  Filled 2019-12-04 (×4): qty 1

## 2019-12-04 MED ORDER — CLONIDINE HCL 0.1 MG PO TABS
0.1000 mg | ORAL_TABLET | Freq: Two times a day (BID) | ORAL | Status: DC
  Administered 2019-12-04 – 2019-12-05 (×3): 0.1 mg via ORAL
  Filled 2019-12-04 (×6): qty 1

## 2019-12-04 MED ORDER — OLANZAPINE 5 MG PO TBDP: 15.0000 mg | ORAL_TABLET | Freq: Every day | ORAL | Status: DC

## 2019-12-04 NOTE — Progress Notes (Signed)
Recreation Therapy Notes  Date: 3.4.21 Time: 1000 Location: 500 Hall Dayroom  Group Topic: Self-Esteem  Goal Area(s) Addresses:  Patient will successfully identify positive attributes about themselves.  Patient will successfully identify benefit of improved self-esteem.   Intervention: Colored pencils, blank crest  Activity: Crest of Arms.  Patients were given a blank crest divided into four sections.  Patients were to identify four things that make them unique for each section of the crest.  Patients could highlight things such as accomplishments, characteristics, important dates, etc.  Education:  Self-Esteem, Discharge Planning.   Education Outcome: Acknowledges education/In group clarification offered/Needs additional education  Clinical Observations/Feedback: Pt did not attend group session.    Caroll Rancher, LRT/CTRS         Caroll Rancher A 12/04/2019 11:37 AM

## 2019-12-04 NOTE — BHH Counselor (Signed)
Adult Comprehensive Assessment  Patient ID: Lindsey Richmond, female   DOB: 1995-10-14, 24 y.o.   MRN: [id number] Information Source: Information source: Patient  Current Stressors: Patient states their primary concerns and needs for treatment are:: "Suicidal Ideations" Patient states their goals for this hospitilization and ongoing recovery are:: "no" "Financial stressors"   Living/Environment/Situation:  Living Arrangements:Both parents Living conditions (as described by patient or guardian): loving home; lives with mother, father How long has patient lived in current situation?: all her life.  What is atmosphere in current home: Comfortable, Loving, Supportive  Family History:  Marital status: Single Are you sexually active?:No What is your sexual orientation?: heterosexual Has your sexual activity been affected by drugs, alcohol, medication, or emotional stress?: n/a  Does patient have children?: No  Childhood History:  By whom was/is the patient raised?: Both parents Additional childhood history information: born [place]  Description of patient's relationship with caregiver when they were a child: close to both parents; mother [place]; father from [place];  Patient's description of current relationship with people who raised him/her:gets along better with father than mother. Father is truck driver often on the road.  How were you disciplined when you got in trouble as a child/adolescent?: n/a  Does patient have siblings?: Yes Number of Siblings: 1 Description of patient's current relationship with siblings: close to her brother  Did patient suffer any verbal/emotional/physical/sexual abuse as a child?: No Did patient suffer from severe childhood neglect?: No Has patient ever been sexually abused/assaulted/raped as an adolescent or adult?: No Was the patient ever a victim of a crime or a disaster?: No Witnessed domestic violence?: No Has patient been effected by  domestic violence as an adult?: No No update to trauma history: 01/16/19.  Education:  Highest grade of school patient has completed:some college Currently a student?:No   Employment/Work Situation:  Employment situation:Unemployed Patient's job has been impacted by current illness: NA What is the longest time patient has a held a job?: never worked  Where was the patient employed at that time?: never worked  Has patient ever been in Passenger transport manager?: No Has patient ever served in Recruitment consultant?: No Did You Receive Any Psychiatric Treatment/Services While in Passenger transport manager?: No Are There Guns or Other Weapons in [place]?: No  Financial Resources:  Museum/gallery curator resources: Support from parents / caregiver Does patient have a Programmer, applications or guardian?: No  Alcohol/Substance Abuse:  What has been your use of drugs/alcohol within the last 12 months?:alcohol: pt denies, drugs: pt denies If attempted suicide, did drugs/alcohol play a role in this?: No Alcohol/Substance Abuse Treatment Hx: Denies past history If yes, describe treatment:  Has alcohol/substance abuse ever caused legal problems?: No  Social Support System: Pensions consultant Support System: Fair Theme park manager, brother, Jesus Type of faith/religion:Christian How does patient's faith help to cope with current illness?:"Jesus supports me."  Leisure/Recreation:  Leisure and Hobbies: reading,jogging, and walking in the neighborhood  Strengths/Needs: What is the patient's perception of their strengths?: "funny, detail oriented, good listener" Patient states they can use these personal strengths during their treatment to contribute to their recovery: Pt unable to answer this question.  Patient states these barriers may affect/interfere with their treatment: none Patient states these barriers may affect their return to the community: none Other important information patient would  like considered in planning for their treatment: none  Discharge Plan: Currently receiving community mental health services: Yes (From Whom)([facility]) Patient states concerns and preferences for aftercare planning are: Will continue  with [facility]: meds and [facility] for therapy.  Patient states they will know when they are safe and ready for discharge when: "not sure" Does patient have access to transportation?: Yes Does patient have financial barriers related to discharge medications?: Yes Patient description of barriers related to discharge medications: no insurance Will patient be returning to same living situation after discharge?: No "will go to a shelter"  Summary/Recommendations:   Summary and Recommendations (to be completed by the evaluator): Pt is a 24 year old female diagnosed with schizophrenia. Patient was found with a knife in bed and has made suicidal comment that she would slit her throat.  But denies suicidal ideation at this time.  Patient has psychiatric outpatient services with [facility] and last visit was around Nov 25, 2019 when received paliperidone palmitate injection.  Patient states that she did not attempt to cut her throat but had the knife in bed with her with the intention of cutting her throat.  States she is feeling worse but did not elaborate.  Patient answering yes/no to most questions with apparent thought blocking.  Patient also endorses auditory/visual hallucination but no elaboration. Recommendations for pt: crisis stabilization, therapeutic milieu, medication management, attend and participate in group therapy, and development of a comprehensive mental wellness plan.  Reynold Bowen. 12/04/2019

## 2019-12-04 NOTE — Progress Notes (Signed)
   12/04/19 1000  Psych Admission Type (Psych Patients Only)  Admission Status Voluntary  Psychosocial Assessment  Patient Complaints Depression  Eye Contact Glaring  Facial Expression Blank;Flat  Affect Preoccupied;Blunted  Speech Slurred;Slow  Interaction Forwards little  Motor Activity Slow  Appearance/Hygiene Unremarkable  Behavior Characteristics Cooperative  Mood Depressed  Aggressive Behavior  Targets Self  Type of Behavior Weapon  Effect No apparent injury  Thought Process  Coherency Blocking  Content Preoccupation  Delusions None reported or observed  Perception WDL  Hallucination None reported or observed  Judgment Poor  Confusion None  Danger to Self  Current suicidal ideation? Denies  Self-Injurious Behavior No self-injurious ideation or behavior indicators observed or expressed   Agreement Not to Harm Self Yes  Description of Agreement verbal  Danger to Others  Danger to Others None reported or observed

## 2019-12-04 NOTE — Tx Team (Signed)
Initial Treatment Plan 12/04/2019 6:24 AM [name] [id number]    PATIENT STRESSORS: Marital or family conflict Medication change or noncompliance   PATIENT STRENGTHS: General fund of knowledge Motivation for treatment/growth   PATIENT IDENTIFIED PROBLEMS: Risk for suicide  Psychosis  "nothing"                 DISCHARGE CRITERIA:  Improved stabilization in mood, thinking, and/or behavior Verbal commitment to aftercare and medication compliance  PRELIMINARY DISCHARGE PLAN: Attend PHP/IOP Attend 12-step recovery group  PATIENT/FAMILY INVOLVEMENT: This treatment plan has been presented to and reviewed with the patient, Lindsey Richmond.  The patient and family have been given the opportunity to ask questions and make suggestions.  Delos Haring, RN 12/04/2019, 6:24 AM

## 2019-12-04 NOTE — BHH Suicide Risk Assessment (Cosign Needed)
B[facility] INPATIENT:  Family/Significant Other Suicide Prevention Education  Suicide Prevention Education:  Patient Refusal for Family/Significant Other Suicide Prevention Education: The patient Lindsey Richmond has refused to provide written consent for family/significant other to be provided Family/Significant Other Suicide Prevention Education during admission and/or prior to discharge.  Physician notified.  Reynold Bowen 12/04/2019, 9:43 AM

## 2019-12-04 NOTE — H&P (Signed)
Psychiatric Admission Assessment Adult  Patient Identification: Lindsey Richmond MRN:  [id number] Date of Evaluation:  12/04/2019 Chief Complaint:  Schizophrenia (HCC) [F20.9] Principal Diagnosis: Dangerousness in the context of schizophrenia Diagnosis:  Active Problems:   Schizophrenia (HCC)  History of Present Illness:   This is the latest of multiple psychiatric admissions and encounters for this 24 year old patient in our healthcare system she is known to have a chronic paranoid schizophrenic condition she re-presented on 3/3 as a walk-in patient.  Her brother found her with a knife in bed she had made suicidal statements.  She is followed by Lindsey Richmond and receives paliperidone injections, last believed to be February 23.  During my interview the patient displays thought blocking and then when she does answer a question causes for a very long period of time but provides no real meaningful information.  She denies all positive symptoms she denies thoughts of harming herself she is oriented to person place situation thinks the month is February and the day is Monday when it is of course Thursday 3/4 at any rate she is displaying a flat affect poverty of content to speech and thought and thought blocking.  She does understand what it means to contract for safety and nods her head that she will do so here.  Apparently there was paranoia leading her to sleep with a knife.  Again, well-known to the service according the assessment team counselor note Lindsey Richmond is an 24 y.o. female presenting voluntarily to [facility] for assessment. Patient is accompanied by her brother, Lindsey Richmond, who is present for assessment and provides collateral information. Patient is a poor historian. She appears to be thought blocking and struggles to complete assessment. She states today she tried to cut her own throat. She is unable to identify a trigger. She denies HI/AVH, however appears to be responding to internal stimuli during  assessment. She denies any substance use or trauma history.   Per patient's brother, Lindsey Richmond: Patient has a history of psychosis and was recently hospitalized at [facility]. She receives medication management through [place] but he feels she needs more. He states today he found her laying in her bed with a knife next to her stating she wanted to kill herself. Patient lives with their parents, but their mother has MS and has difficulty caring for her. Patient is religiously preoccupied and makes statements that "God is coming." He states her religous preoccupation, along with loneliness, are triggers for her SI.  Patient is alert and oriented x 2. Her speech is soft, eye contact is poor, and she appears to be thought blocking. Her mood is depressed and her affect is flat. She has poor insight, judgement, and impulse control. She appears to be responding to internal stimuli.   Associated Signs/Symptoms: Depression Symptoms:  psychomotor retardation, (Hypo) Manic Symptoms:  Distractibility, Anxiety Symptoms:  n/a Psychotic Symptoms:  Delusions, PTSD Symptoms: NA Total Time spent with patient: 45 minutes  Past Psychiatric History:  Multiple admissions in the past medication trials has been on long-acting injectable aripiprazole, paliperidone, Depakote, olanzapine, Risperdal Is the patient at risk to self? Yes.    Has the patient been a risk to self in the past 6 months? Yes.    Has the patient been a risk to self within the distant past? Yes.    Is the patient a risk to others? No.  Has the patient been a risk to others in the past 6 months? No.  Has the patient been a risk to others within  the distant past? No.   Prior Inpatient Therapy: Prior Inpatient Therapy: Yes Prior Therapy Dates: 01/2019, 10/2019 Prior Therapy Facilty/Provider(s): [facility], [facility] Reason for Treatment: Psychosis Prior Outpatient Therapy: Prior Outpatient Therapy: Yes Prior Therapy Dates: active Prior Therapy  Facilty/Provider(s): [facility] Reason for Treatment: psychosis Does patient have an ACCT team?: No Does patient have Intensive In-House Services?  : No Does patient have [facility] services? : No Does patient have P4CC services?: No  Alcohol Screening: 1. How often do you have a drink containing alcohol?: Never 2. How many drinks containing alcohol do you have on a typical day when you are drinking?: 1 or 2 3. How often do you have six or more drinks on one occasion?: Never AUDIT-C Score: 0 4. How often during the last year have you found that you were not able to stop drinking once you had started?: Never 5. How often during the last year have you failed to do what was normally expected from you becasue of drinking?: Never 6. How often during the last year have you needed a first drink in the morning to get yourself going after a heavy drinking session?: Never 7. How often during the last year have you had a feeling of guilt of remorse after drinking?: Never 8. How often during the last year have you been unable to remember what happened the night before because you had been drinking?: Never 9. Have you or someone else been injured as a result of your drinking?: No 10. Has a relative or friend or a doctor or another health worker been concerned about your drinking or suggested you cut down?: No Alcohol Use Disorder Identification Test Final Score (AUDIT): 0 Substance Abuse History in the last 12 months:  No. Consequences of Substance Abuse: NA screen negative for all compounds tested in January Previous Psychotropic Medications: Yes  Psychological Evaluations: No  Past Medical History: History reviewed. No pertinent past medical history. History reviewed. No pertinent surgical history. Family History:  Family History  Problem Relation Age of Onset  . Heart failure Mother   . Hypertension Mother   . Diabetes Father    Family Psychiatric  History: neg Tobacco Screening: Have you used any  form of tobacco in the last 30 days? (Cigarettes, Smokeless Tobacco, Cigars, and/or Pipes): No Social History:  Social History   Substance and Sexual Activity  Alcohol Use No     Social History   Substance and Sexual Activity  Drug Use No    Additional Social History: Marital status: Single    Pain Medications: See MAr Prescriptions: See MAR Over the Counter: See MAR History of alcohol / drug use?: No history of alcohol / drug abuse                    Allergies:  No Known Allergies Lab Results:  Results for orders placed or performed during the hospital encounter of 12/03/19 (from the past 48 hour(s))  Respiratory Panel by RT PCR (Flu A&B, Covid) - Nasopharyngeal Swab     Status: None   Collection Time: 12/03/19  6:38 PM   Specimen: Nasopharyngeal Swab  Result Value Ref Range   SARS Coronavirus 2 by RT PCR NEGATIVE NEGATIVE    Comment: (NOTE) SARS-CoV-2 target nucleic acids are NOT DETECTED. The SARS-CoV-2 RNA is generally detectable in upper respiratoy specimens during the acute phase of infection. The lowest concentration of SARS-CoV-2 viral copies this assay can detect is 131 copies/mL. A negative result does not preclude  SARS-Cov-2 infection and should not be used as the sole basis for treatment or other patient management decisions. A negative result may occur with  improper specimen collection/handling, submission of specimen other than nasopharyngeal swab, presence of viral mutation(s) within the areas targeted by this assay, and inadequate number of viral copies (<131 copies/mL). A negative result must be combined with clinical observations, patient history, and epidemiological information. The expected result is Negative. Fact Sheet for Patients:  https://www.moore.com/https://www.fda.gov/media/142436/download Fact Sheet for Healthcare Providers:  https://www.young.biz/https://www.fda.gov/media/142435/download This test is not yet ap proved or cleared by the [place] FDA and  has been  authorized for detection and/or diagnosis of SARS-CoV-2 by FDA under an Emergency Use Authorization (EUA). This EUA will remain  in effect (meaning this test can be used) for the duration of the COVID-19 declaration under Section 564(b)(1) of the Act, 21 U.S.C. section 360bbb-3(b)(1), unless the authorization is terminated or revoked sooner.    Influenza A by PCR NEGATIVE NEGATIVE   Influenza B by PCR NEGATIVE NEGATIVE    Comment: (NOTE) The Xpert Xpress SARS-CoV-2/FLU/RSV assay is intended as an aid in  the diagnosis of influenza from Nasopharyngeal swab specimens and  should not be used as a sole basis for treatment. Nasal washings and  aspirates are unacceptable for Xpert Xpress SARS-CoV-2/FLU/RSV  testing. Fact Sheet for Patients: https://www.moore.com/https://www.fda.gov/media/142436/download Fact Sheet for Healthcare Providers: https://www.young.biz/https://www.fda.gov/media/142435/download This test is not yet approved or cleared by the [place] FDA and  has been authorized for detection and/or diagnosis of SARS-CoV-2 by  FDA under an Emergency Use Authorization (EUA). This EUA will remain  in effect (meaning this test can be used) for the duration of the  Covid-19 declaration under Section 564(b)(1) of the Act, 21  U.S.C. section 360bbb-3(b)(1), unless the authorization is  terminated or revoked. Performed at [facility], [address] [place], [place] [address]   CBC     Status: None   Collection Time: 12/04/19  6:42 AM  Result Value Ref Range   WBC 6.5 4.0 - 10.5 K/uL   RBC 4.44 3.87 - 5.11 MIL/uL   Hemoglobin 13.6 12.0 - 15.0 g/dL   HCT 30.842.0 65.736.0 - 84.646.0 %   MCV 94.6 80.0 - 100.0 fL   MCH 30.6 26.0 - 34.0 pg   MCHC 32.4 30.0 - 36.0 g/dL   RDW 96.212.5 95.211.5 - 84.115.5 %   Platelets 297 150 - 400 K/uL   nRBC 0.0 0.0 - 0.2 %    Comment: Performed at [facility], [address] [place], [place] [address]  Comprehensive metabolic panel     Status: Abnormal   Collection  Time: 12/04/19  6:42 AM  Result Value Ref Range   Sodium 138 135 - 145 mmol/L   Potassium 3.6 3.5 - 5.1 mmol/L   Chloride 103 98 - 111 mmol/L   CO2 25 22 - 32 mmol/L   Glucose, Bld 90 70 - 99 mg/dL    Comment: Glucose reference range applies only to samples taken after fasting for at least 8 hours.   BUN 14 6 - 20 mg/dL   Creatinine, Ser 1.020.85 0.44 - 1.00 mg/dL   Calcium 9.0 8.9 - 72.510.3 mg/dL   Total Protein 8.9 (H) 6.5 - 8.1 g/dL   Albumin 3.6 3.5 - 5.0 g/dL   AST 14 (L) 15 - 41 U/L   ALT 15 0 - 44 U/L   Alkaline Phosphatase 53 38 - 126 U/L   Total Bilirubin 0.8 0.3 -  1.2 mg/dL   GFR calc non Af Amer >60 >60 mL/min   GFR calc Af Amer >60 >60 mL/min   Anion gap 10 5 - 15    Comment: Performed at [facility], [address], [place], [place] [address]  Hemoglobin A1c     Status: None   Collection Time: 12/04/19  6:42 AM  Result Value Ref Range   Hgb A1c MFr Bld 5.3 4.8 - 5.6 %    Comment: (NOTE) Pre diabetes:          5.7%-6.4% Diabetes:              >6.4% Glycemic control for   <7.0% adults with diabetes    Mean Plasma Glucose 105.41 mg/dL    Comment: Performed at [facility], [address] [place], [place] [address]  Lipid panel     Status: Abnormal   Collection Time: 12/04/19  6:42 AM  Result Value Ref Range   Cholesterol 133 0 - 200 mg/dL   Triglycerides 40 <098 mg/dL   HDL 25 (L) >11 mg/dL   Total CHOL/HDL Ratio 5.3 RATIO   VLDL 8 0 - 40 mg/dL   LDL Cholesterol 914 (H) 0 - 99 mg/dL    Comment:        Total Cholesterol/HDL:CHD Risk Coronary Heart Disease Risk Table                     Men   Women  1/2 Average Risk   3.4   3.3  Average Risk       5.0   4.4  2 X Average Risk   9.6   7.1  3 X Average Risk  23.4   11.0        Use the calculated Patient Ratio above and the CHD Risk Table to determine the patient's CHD Risk.        ATP III CLASSIFICATION (LDL):  <100     mg/dL   Optimal  782-956  mg/dL   Near or Above                     Optimal  130-159  mg/dL   Borderline  213-086  mg/dL   High  >578     mg/dL   Very High Performed at [facility], [address], [place], [place] [address]     Blood Alcohol level:  Lab Results  Component Value Date   [facility] <10 10/23/2019   ETH <10 01/13/2019    Metabolic Disorder Labs:  Lab Results  Component Value Date   HGBA1C 5.3 12/04/2019   MPG 105.41 12/04/2019   MPG 105.41 10/23/2019   Lab Results  Component Value Date   PROLACTIN 75.7 (H) 10/23/2019   PROLACTIN 102.0 (H) 05/09/2017   Lab Results  Component Value Date   CHOL 133 12/04/2019   TRIG 40 12/04/2019   HDL 25 (L) 12/04/2019   CHOLHDL 5.3 12/04/2019   VLDL 8 12/04/2019   LDLCALC 100 (H) 12/04/2019   LDLCALC 102 (H) 10/23/2019    Current Medications: Current Facility-Administered Medications  Medication Dose Route Frequency Provider Last Rate Last Admin  . benztropine (COGENTIN) tablet 0.5 mg  0.5 mg Oral BID Malvin Johns, MD   0.5 mg at 12/04/19 1048  . cloNIDine (CATAPRES) tablet 0.1 mg  0.1 mg Oral BID Malvin Johns, MD      . hydrOXYzine (ATARAX/VISTARIL) tablet 25 mg  25 mg  Oral TID PRN Johnn Hai, MD      . OLANZapine zydis [facility]) disintegrating tablet 15 mg  15 mg Oral QHS Johnn Hai, MD      . OLANZapine zydis [facility]) disintegrating tablet 5 mg  5 mg Oral Daily Johnn Hai, MD      . traZODone (DESYREL) tablet 50 mg  50 mg Oral QHS PRN Johnn Hai, MD       PTA Medications: Medications Prior to Admission  Medication Sig Dispense Refill Last Dose  . benztropine (COGENTIN) 0.5 MG tablet Take 1 tablet (0.5 mg total) by mouth 2 (two) times daily. 180 tablet 1   . OLANZapine zydis (ZYPREXA) 10 MG disintegrating tablet Take 1 tablet (10 mg total) by mouth at bedtime. 90 tablet 1     Musculoskeletal: Strength & Muscle Tone: within normal limits Gait & Station: normal Patient leans: N/A  Psychiatric Specialty Exam: Physical Exam  Nursing note and vitals  reviewed. Constitutional: She appears well-developed and well-nourished.  Cardiovascular: Normal rate and regular rhythm.    Review of Systems  Constitutional: Negative.   Eyes: Negative.   Respiratory: Negative.   Cardiovascular: Negative.   Endocrine: Negative.   Genitourinary: Negative.   Musculoskeletal: Negative.   Neurological: Negative.     Blood pressure (!) 149/138, pulse (!) 116, temperature 98.1 F (36.7 C), temperature source Oral, resp. rate 18, height 5\' 3"  (1.6 m), weight 115.2 kg, SpO2 100 %.Body mass index is 44.99 kg/m.  General Appearance: Casual  Eye Contact:  Fair  Speech:  Slow  Volume:  Decreased  Mood:  Dysphoric  Affect:  Blunt  Thought Process:  Disorganized  Orientation:  Other:  Person place situation not exact date or day  Thought Content:  Delusions and Hallucinations: Auditory and on reports from yesterday also poverty of content and thought blocking worsening of the exact level of pathology  Suicidal Thoughts:  No  Homicidal Thoughts:  No  Memory:  Immediate;   Poor Recent;   Poor Remote;   Poor  Judgement:  Impaired  Insight:  Lacking  Psychomotor Activity:  Decreased  Concentration:  Concentration: Poor and Attention Span: Poor  Recall:  Poor  Fund of Knowledge:  Poor  Language:  Poor  Akathisia:  Negative  Handed:  Right  AIMS (if indicated):     Assets:  Armed forces logistics/support/administrative officer Housing Leisure Time Physical Health Resilience  ADL's:  Intact  Cognition:  WNL  Sleep:       Treatment Plan Summary: Daily contact with patient to assess and evaluate symptoms and progress in treatment and Medication management  Observation Level/Precautions:  15 minute checks  Laboratory:  UDS  Psychotherapy: Reality based med and illness education  Medications: Escalate olanzapine mindful of paliperidone injection  Consultations:    Discharge Concerns: Longer-term stability  Estimated LOS:  Other:     Physician Treatment Plan for Primary  Diagnosis: Paranoid schizophrenia resume combination antipsychotic therapy Long Term Goal(s): Improvement in symptoms so as ready for discharge  Short Term Goals: Ability to identify changes in lifestyle to reduce recurrence of condition will improve, Ability to verbalize feelings will improve, Ability to disclose and discuss suicidal ideas and Ability to identify and develop effective coping behaviors will improve  Physician Treatment Plan for Secondary Diagnosis: Active Problems:   Schizophrenia ([place])  Long Term Goal(s): Improvement in symptoms so as ready for discharge  Short Term Goals: Ability to identify changes in lifestyle to reduce recurrence of condition will improve, Ability to verbalize feelings  will improve, Ability to disclose and discuss suicidal ideas and Ability to demonstrate self-control will improve  I certify that inpatient services furnished can reasonably be expected to improve the patient's condition.    Malvin Johns, MD 3/4/202112:48 PM

## 2019-12-04 NOTE — Progress Notes (Signed)
Admission Note:   21yr female who presents [facility] in no acute distress for the treatment of SI/ psychosis and Depression. Pt appears flat and depressed. Pt was calm and cooperative with admission process. Pt presents with passive SI and contracts for safety upon admission. Pt denies AVH , but exhibiting thought blocking and appeared to be responding to internal stimuli at times . Pt admission mostly completed in the observation unit.  Per Assessment:  accompanied by her brother, [name], who is present for assessment and provides collateral information. Patient is a poor historian. She appears to be thought blocking and struggles to complete assessment. She states today she tried to cut her own throat. She is unable to identify a trigger. She denies HI/AVH, however appears to be responding to internal stimuli during assessment. Per patient's brother, [name]: Patient has a history of psychosis and was recently hospitalized at [facility]. She receives medication management through [facility] but he feels she needs more. He states today he found her laying in her bed with a knife next to her stating she wanted to kill herself. Patient lives with their parents, but their mother has MS and has difficulty caring for her. Patient is religiously preoccupied and makes statements that "God is coming."      POC and unit policies explained and reinforcement needed . Consents obtained. Food and fluids offered, and refused.   R: Pt had no additional questions or concerns.

## 2019-12-04 NOTE — Progress Notes (Signed)
Recreation Therapy Notes  INPATIENT RECREATION THERAPY ASSESSMENT  Patient Details Name: Lindsey Richmond MRN: [id number] DOB: 1996/08/05 Today's Date: 12/04/2019       Information Obtained From: Patient  Able to Participate in Assessment/Interview: Yes  Patient Presentation: (Flat, Depressed)  Reason for Admission (Per Patient): Suicide Attempt  Patient Stressors: Other (Comment)(Mental stress)  Coping Skills:   Isolation, Self-Injury, Write, TV, Arguments, Music, Exercise, Deep Breathing, Art, Avoidance, Read  Leisure Interests (2+):  Individual - Reading, Art - Draw, Art - Paint  Frequency of Recreation/Participation: Other (Comment)(Every now and then)  Awareness of Community Resources:  Yes  Community Resources:  Park, Public affairs consultant, Engineer, petroleum  Current Use: Yes  If no, Barriers?:    Expressed Interest in [employer] Information: No  [employer] of Residence:  [place]  Patient Main Form of Transportation: Therapist, music  Patient Strengths:  Helping self  Patient Identified Areas of Improvement:  Communication  Patient Goal for Hospitalization:  "I don't know"  Current SI (including self-harm):  No  Current HI:  No  Current AVH: No  Staff Intervention Plan: Group Attendance, Collaborate with Interdisciplinary Treatment Team  Consent to Intern Participation: N/A    Caroll Rancher, LRT/CTRS  Lillia Abed, Jearld Hemp A 12/04/2019, 12:00 PM

## 2019-12-04 NOTE — BHH Suicide Risk Assessment (Signed)
[facility] Admission Suicide Risk Assessment   Nursing information obtained from:  Patient Demographic factors:  Unemployed, Adolescent or young adult, Cardell Peach, lesbian, or bisexual orientation Current Mental Status:  NA Loss Factors:  NA Historical Factors:  Prior suicide attempts, Impulsivity Risk Reduction Factors:  Living with another person, especially a relative  Total Time spent with patient: 45 minutes Principal Problem: <principal problem not specified> Diagnosis:  Active Problems:   Schizophrenia (HCC)  Subjective Data: see eval  Continued Clinical Symptoms:  Alcohol Use Disorder Identification Test Final Score (AUDIT): 0 The "Alcohol Use Disorders Identification Test", Guidelines for Use in Primary Care, Second Edition.  World Science writer [facility]). Score between 0-7:  no or low risk or alcohol related problems. Score between 8-15:  moderate risk of alcohol related problems. Score between 16-19:  high risk of alcohol related problems. Score 20 or above:  warrants further diagnostic evaluation for alcohol dependence and treatment.   CLINICAL FACTORS:   Currently Psychotic Musculoskeletal: Strength & Muscle Tone: within normal limits Gait & Station: normal Patient leans: N/A  Psychiatric Specialty Exam: Physical Exam  Nursing note and vitals reviewed. Constitutional: She appears well-developed and well-nourished.  Cardiovascular: Normal rate and regular rhythm.    Review of Systems  Constitutional: Negative.   Eyes: Negative.   Respiratory: Negative.   Cardiovascular: Negative.   Endocrine: Negative.   Genitourinary: Negative.   Musculoskeletal: Negative.   Neurological: Negative.     Blood pressure (!) 149/138, pulse (!) 116, temperature 98.1 F (36.7 C), temperature source Oral, resp. rate 18, height 5\' 3"  (1.6 m), weight 115.2 kg, SpO2 100 %.Body mass index is 44.99 kg/m.  General Appearance: Casual  Eye Contact:  Fair  Speech:  Slow  Volume:  Decreased   Mood:  Dysphoric  Affect:  Blunt  Thought Process:  Disorganized  Orientation:  Other:  Person place situation not exact date or day  Thought Content:  Delusions and Hallucinations: Auditory and on reports from yesterday also poverty of content and thought blocking worsening of the exact level of pathology  Suicidal Thoughts:  No  Homicidal Thoughts:  No  Memory:  Immediate;   Poor Recent;   Poor Remote;   Poor  Judgement:  Impaired  Insight:  Lacking  Psychomotor Activity:  Decreased  Concentration:  Concentration: Poor and Attention Span: Poor  Recall:  Poor  Fund of Knowledge:  Poor  Language:  Poor  Akathisia:  Negative  Handed:  Right  AIMS (if indicated):     Assets:  Housing Leisure Time Physical Health Resilience  ADL's:  Intact  Cognition:  WNL  Sleep:          COGNITIVE FEATURES THAT CONTRIBUTE TO RISK:  Loss of executive function    SUICIDE RISK:   Minimal: No identifiable suicidal ideation.  Patients presenting with no risk factors but with morbid ruminations; may be classified as minimal risk based on the severity of the depressive symptoms  PLAN OF CARE: see eval  I certify that inpatient services furnished can reasonably be expected to improve the patient's condition.   Manufacturing systems engineer, MD 12/04/2019, 12:54 PM

## 2019-12-05 LAB — PROLACTIN: Prolactin: 57.7 ng/mL — ABNORMAL HIGH (ref 4.8–23.3)

## 2019-12-05 MED ORDER — ARIPIPRAZOLE 2 MG PO TABS
2.0000 mg | ORAL_TABLET | Freq: Once | ORAL | Status: AC
  Administered 2019-12-05: 2 mg via ORAL
  Filled 2019-12-05 (×2): qty 1

## 2019-12-05 NOTE — Progress Notes (Signed)
Recreation Therapy Notes  Date: 3.5.21 Time: 1000 Location:  500 Hall Dayroom  Group Topic: Communication, Team Building, Problem Solving  Goal Area(s) Addresses:  Patient will effectively work with peer towards shared goal.  Patient will identify skills used to make activity successful.  Patient will identify how skills used during activity can be used to reach post d/c goals.   Behavioral Response: Engaged  Intervention: STEM Activity  Activity: Straw Bridge.  In groups, patients were given 15 straws and 2 ft of masking tape.  Using the supplies given, patients were to construct an elevated bridge that could hold a small puzzle box.  Education: Pharmacist, community, Discharge Planning   Education Outcome: Acknowledges education/In group clarification offered/Needs additional education.   Clinical Observations/Feedback:  Pt was active and engaged during activity.  Pt was pleasant and worked well with peers.  Pt expressed the group used problem solving and communication to complete activity.  Pt was called out of group before completion of processing.    Caroll Rancher, LRT/CTRS     Caroll Rancher A 12/05/2019 11:36 AM

## 2019-12-05 NOTE — Progress Notes (Cosign Needed)
The patient was examined in the dayroom as requested. She appeared to have some thought blocking and responded with vague answers. She reported sleeping well last night at bedtime. She denied AVH. The patient stated she was admitted to the hospital because she wanted to commit suicide, but is no longer having thoughts to commit suicide or harm herself. She is able to contract for safety. She denies having any side effects from the medications prescribed.

## 2019-12-05 NOTE — Progress Notes (Signed)
[facility] MD Progress Note  12/05/2019 11:04 AM Lindsey Richmond  MRN:  [id number] Subjective:   According to the HPI This is the latest of multiple psychiatric admissions and encounters for this 24 year old patient in our healthcare system she is known to have a chronic paranoid schizophrenic condition she re-presented on 3/3 as a walk-in patient.  Her brother found her with a knife in bed she had made suicidal statements.  She is followed by [facility] and receives paliperidone injections, last believed to be February 23. During my interview the patient displays thought blocking and then when she does answer a question causes for a very long period of time but provides no real meaningful information.  She denies all positive symptoms she denies thoughts of harming herself she is oriented to person place situation thinks the month is February and the day is Monday when it is of course Thursday 3/4 at any rate she is displaying a flat affect poverty of content to speech and thought and thought blocking.  She does understand what it means to contract for safety and nods her head that she will do so here.  Apparently there was paranoia leading her to sleep with a knife.  Seen in rounds and then seen later in the team meeting, when asked if she still had fears or paranoia she stated "it is religious, it is personal" so she still gives answers implying there is still delusional belief and would not elaborate denies hallucinations or all positive symptoms.  Again when asked why she was sleeping with a knife describes it as "religious" but again not responding to stimuli thought is no longer blocked and showing some improvement team think she needs to stay through the weekend.  Principal Problem: Schizophrenic disorder Diagnosis: Active Problems:   Schizophrenia (HCC)  Total Time spent with patient: 20 minutes  Past Psychiatric History: see HPI  Past Medical History: History reviewed. No pertinent past medical  history. History reviewed. No pertinent surgical history. Family History:  Family History  Problem Relation Age of Onset  . Heart failure Mother   . Hypertension Mother   . Diabetes Father    Family Psychiatric  History: no new data Social History:  Social History   Substance and Sexual Activity  Alcohol Use No     Social History   Substance and Sexual Activity  Drug Use No    Social History   Socioeconomic History  . Marital status: Single    Spouse name: Not on file  . Number of children: Not on file  . Years of education: Not on file  . Highest education level: Not on file  Occupational History  . Not on file  Tobacco Use  . Smoking status: Never Smoker  . Smokeless tobacco: Never Used  Substance and Sexual Activity  . Alcohol use: No  . Drug use: No  . Sexual activity: Never    Birth control/protection: None  Other Topics Concern  . Not on file  Social History Narrative  . Not on file   Social Determinants of Health   Financial Resource Strain:   . Difficulty of Paying Living Expenses: Not on file  Food Insecurity:   . Worried About Programme researcher, broadcasting/film/video in the Last Year: Not on file  . Ran Out of Food in the Last Year: Not on file  Transportation Needs:   . Lack of Transportation (Medical): Not on file  . Lack of Transportation (Non-Medical): Not on file  Physical Activity:   .  Days of Exercise per Week: Not on file  . Minutes of Exercise per Session: Not on file  Stress:   . Feeling of Stress : Not on file  Social Connections:   . Frequency of Communication with Friends and Family: Not on file  . Frequency of Social Gatherings with Friends and Family: Not on file  . Attends Religious Services: Not on file  . Active Member of Clubs or Organizations: Not on file  . Attends Banker Meetings: Not on file  . Marital Status: Not on file   Additional Social History:    Pain Medications: See MAr Prescriptions: See MAR Over the Counter:  See MAR History of alcohol / drug use?: No history of alcohol / drug abuse                    Sleep: Fair  Appetite:  Fair  Current Medications: Current Facility-Administered Medications  Medication Dose Route Frequency Provider Last Rate Last Admin  . benztropine (COGENTIN) tablet 0.5 mg  0.5 mg Oral BID Malvin Johns, MD   0.5 mg at 12/05/19 1017  . cloNIDine (CATAPRES) tablet 0.1 mg  0.1 mg Oral BID Malvin Johns, MD   0.1 mg at 12/05/19 5102  . hydrOXYzine (ATARAX/VISTARIL) tablet 25 mg  25 mg Oral TID PRN Malvin Johns, MD      . OLANZapine zydis (ZYPREXA) disintegrating tablet 10 mg  10 mg Oral QHS Malvin Johns, MD   10 mg at 12/04/19 2156  . OLANZapine zydis (ZYPREXA) disintegrating tablet 5 mg  5 mg Oral Daily Malvin Johns, MD   5 mg at 12/05/19 0824  . traZODone (DESYREL) tablet 50 mg  50 mg Oral QHS PRN Malvin Johns, MD        Lab Results:  Results for orders placed or performed during the hospital encounter of 12/03/19 (from the past 48 hour(s))  Respiratory Panel by RT PCR (Flu A&B, Covid) - Nasopharyngeal Swab     Status: None   Collection Time: 12/03/19  6:38 PM   Specimen: Nasopharyngeal Swab  Result Value Ref Range   SARS Coronavirus 2 by RT PCR NEGATIVE NEGATIVE    Comment: (NOTE) SARS-CoV-2 target nucleic acids are NOT DETECTED. The SARS-CoV-2 RNA is generally detectable in upper respiratoy specimens during the acute phase of infection. The lowest concentration of SARS-CoV-2 viral copies this assay can detect is 131 copies/mL. A negative result does not preclude SARS-Cov-2 infection and should not be used as the sole basis for treatment or other patient management decisions. A negative result may occur with  improper specimen collection/handling, submission of specimen other than nasopharyngeal swab, presence of viral mutation(s) within the areas targeted by this assay, and inadequate number of viral copies (<131 copies/mL). A negative result must be  combined with clinical observations, patient history, and epidemiological information. The expected result is Negative. Fact Sheet for Patients:  https://www.moore.com/ Fact Sheet for Healthcare Providers:  https://www.young.biz/ This test is not yet ap proved or cleared by the [place] FDA and  has been authorized for detection and/or diagnosis of SARS-CoV-2 by FDA under an Emergency Use Authorization (EUA). This EUA will remain  in effect (meaning this test can be used) for the duration of the COVID-19 declaration under Section 564(b)(1) of the Act, 21 U.S.C. section 360bbb-3(b)(1), unless the authorization is terminated or revoked sooner.    Influenza A by PCR NEGATIVE NEGATIVE   Influenza B by PCR NEGATIVE NEGATIVE    Comment: (NOTE) The  Xpert Xpress SARS-CoV-2/FLU/RSV assay is intended as an aid in  the diagnosis of influenza from Nasopharyngeal swab specimens and  should not be used as a sole basis for treatment. Nasal washings and  aspirates are unacceptable for Xpert Xpress SARS-CoV-2/FLU/RSV  testing. Fact Sheet for Patients: PinkCheek.be Fact Sheet for Healthcare Providers: GravelBags.it This test is not yet approved or cleared by the [place] FDA and  has been authorized for detection and/or diagnosis of SARS-CoV-2 by  FDA under an Emergency Use Authorization (EUA). This EUA will remain  in effect (meaning this test can be used) for the duration of the  Covid-19 declaration under Section 564(b)(1) of the Act, 21  U.S.C. section 360bbb-3(b)(1), unless the authorization is  terminated or revoked. Performed at [facility], [place] [address] [place], [place] [address]   CBC     Status: None   Collection Time: 12/04/19  6:42 AM  Result Value Ref Range   WBC 6.5 4.0 - 10.5 K/uL   RBC 4.44 3.87 - 5.11 MIL/uL   Hemoglobin 13.6 12.0 - 15.0 g/dL   HCT  42.0 36.0 - 46.0 %   MCV 94.6 80.0 - 100.0 fL   MCH 30.6 26.0 - 34.0 pg   MCHC 32.4 30.0 - 36.0 g/dL   RDW 12.5 11.5 - 15.5 %   Platelets 297 150 - 400 K/uL   nRBC 0.0 0.0 - 0.2 %    Comment: Performed at [facility], [place] [address], [place], [place] [address]  Comprehensive metabolic panel     Status: Abnormal   Collection Time: 12/04/19  6:42 AM  Result Value Ref Range   Sodium 138 135 - 145 mmol/L   Potassium 3.6 3.5 - 5.1 mmol/L   Chloride 103 98 - 111 mmol/L   CO2 25 22 - 32 mmol/L   Glucose, Bld 90 70 - 99 mg/dL    Comment: Glucose reference range applies only to samples taken after fasting for at least 8 hours.   BUN 14 6 - 20 mg/dL   Creatinine, Ser 0.85 0.44 - 1.00 mg/dL   Calcium 9.0 8.9 - 10.3 mg/dL   Total Protein 8.9 (H) 6.5 - 8.1 g/dL   Albumin 3.6 3.5 - 5.0 g/dL   AST 14 (L) 15 - 41 U/L   ALT 15 0 - 44 U/L   Alkaline Phosphatase 53 38 - 126 U/L   Total Bilirubin 0.8 0.3 - 1.2 mg/dL   GFR calc non Af Amer >60 >60 mL/min   GFR calc Af Amer >60 >60 mL/min   Anion gap 10 5 - 15    Comment: Performed at [facility], [place] [address], [place], [place] [address]  Hemoglobin A1c     Status: None   Collection Time: 12/04/19  6:42 AM  Result Value Ref Range   Hgb A1c MFr Bld 5.3 4.8 - 5.6 %    Comment: (NOTE) Pre diabetes:          5.7%-6.4% Diabetes:              >6.4% Glycemic control for   <7.0% adults with diabetes    Mean Plasma Glucose 105.41 mg/dL    Comment: Performed at [place] [address] [place], [place] [address]  Lipid panel     Status: Abnormal   Collection Time: 12/04/19  6:42 AM  Result Value Ref Range   Cholesterol 133 0 - 200 mg/dL   Triglycerides 40 <150  mg/dL   HDL 25 (L) >94 mg/dL   Total CHOL/HDL Ratio 5.3 RATIO   VLDL 8 0 - 40 mg/dL   LDL Cholesterol 174 (H) 0 - 99 mg/dL    Comment:        Total Cholesterol/HDL:CHD Risk Coronary Heart Disease Risk Table                     Men    Women  1/2 Average Risk   3.4   3.3  Average Risk       5.0   4.4  2 X Average Risk   9.6   7.1  3 X Average Risk  23.4   11.0        Use the calculated Patient Ratio above and the CHD Risk Table to determine the patient's CHD Risk.        ATP III CLASSIFICATION (LDL):  <100     mg/dL   Optimal  081-448  mg/dL   Near or Above                    Optimal  130-159  mg/dL   Borderline  185-631  mg/dL   High  >497     mg/dL   Very High Performed at [facility], [address], [place], [place] [address]   Prolactin     Status: Abnormal   Collection Time: 12/04/19  6:42 AM  Result Value Ref Range   Prolactin 57.7 (H) 4.8 - 23.3 ng/mL    Comment: (NOTE) Performed At: [facility] [address] [place], [place] [id number] Jolene Schimke MD AJ:[id number]     Blood Alcohol level:  Lab Results  Component Value Date   [facility] <10 10/23/2019   ETH <10 01/13/2019    Metabolic Disorder Labs: Lab Results  Component Value Date   HGBA1C 5.3 12/04/2019   MPG 105.41 12/04/2019   MPG 105.41 10/23/2019   Lab Results  Component Value Date   PROLACTIN 57.7 (H) 12/04/2019   PROLACTIN 75.7 (H) 10/23/2019   Lab Results  Component Value Date   CHOL 133 12/04/2019   TRIG 40 12/04/2019   HDL 25 (L) 12/04/2019   CHOLHDL 5.3 12/04/2019   VLDL 8 12/04/2019   LDLCALC 100 (H) 12/04/2019   LDLCALC 102 (H) 10/23/2019    Physical Findings: AIMS: Facial and Oral Movements Muscles of Facial Expression: None, normal Lips and Perioral Area: None, normal Jaw: None, normal Tongue: None, normal,Extremity Movements Upper (arms, wrists, hands, fingers): None, normal Lower (legs, knees, ankles, toes): None, normal, Trunk Movements Neck, shoulders, hips: None, normal, Overall Severity Severity of abnormal movements (highest score from questions above): None, normal Incapacitation due to abnormal movements: None, normal Patient's awareness of abnormal movements (rate  only patient's report): No Awareness, Dental Status Current problems with teeth and/or dentures?: No Does patient usually wear dentures?: No  CIWA:  CIWA-Ar Total: 2 COWS:  COWS Total Score: 1  Musculoskeletal: Strength & Muscle Tone: within normal limits Gait & Station: normal Patient leans: N/A  Psychiatric Specialty Exam: Physical Exam  Review of Systems  Blood pressure (!) 129/117, pulse 86, temperature 97.7 F (36.5 C), temperature source Oral, resp. rate 18, height 5\' 3"  (1.6 m), weight 115.2 kg, SpO2 100 %.Body mass index is 44.99 kg/m.  General Appearance: Casual  Eye Contact:  Minimal  Speech:  Slow  Volume:  Decreased  Mood:  Dysphoric  Affect:  Blunt  Thought Process:  Goal  Directed and Descriptions of Associations: Circumstantial  Orientation:  Full (Time, Place, and Person)  Thought Content:  Paranoid Ideation and Rumination  Suicidal Thoughts:  No  Homicidal Thoughts:  No  Memory:  Immediate;   Fair Recent;   Fair Remote;   Fair  Judgement:  Fair  Insight:  Fair  Psychomotor Activity:  Normal  Concentration:  Concentration: Fair and Attention Span: Fair  Recall:  [employer] of Knowledge:  Fair  Language:  Fair  Akathisia:  Negative  Handed:  Right  AIMS (if indicated):     Assets:  Resilience Social Support  ADL's:  Intact  Cognition:  WNL  Sleep:  Number of Hours: 6.75     Treatment Plan Summary: Daily contact with patient to assess and evaluate symptoms and progress in treatment and Medication management  Continue olanzapine doses of 5 in the morning and 15 at bedtime no change in precautions monitor on 15-minute checks probable discharge Monday or Tuesday   Minh Jasper, MD 12/05/2019, 11:04 AM

## 2019-12-05 NOTE — Tx Team (Signed)
Interdisciplinary Treatment and Diagnostic Plan Update  12/05/2019 Time of Session: 10:20am Lindsey Richmond MRN: [id number]  Principal Diagnosis: <principal problem not specified>  Secondary Diagnoses: Active Problems:   Schizophrenia ([place])   Current Medications:  Current Facility-Administered Medications  Medication Dose Route Frequency Provider Last Rate Last Admin  . benztropine (COGENTIN) tablet 0.5 mg  0.5 mg Oral BID Johnn Hai, MD   0.5 mg at 12/05/19 6606  . cloNIDine (CATAPRES) tablet 0.1 mg  0.1 mg Oral BID Johnn Hai, MD   0.1 mg at 12/05/19 3016  . hydrOXYzine (ATARAX/VISTARIL) tablet 25 mg  25 mg Oral TID PRN Johnn Hai, MD      . OLANZapine zydis (ZYPREXA) disintegrating tablet 10 mg  10 mg Oral QHS Johnn Hai, MD   10 mg at 12/04/19 2156  . OLANZapine zydis (ZYPREXA) disintegrating tablet 5 mg  5 mg Oral Daily Johnn Hai, MD   5 mg at 12/05/19 0824  . traZODone (DESYREL) tablet 50 mg  50 mg Oral QHS PRN Johnn Hai, MD       PTA Medications: Medications Prior to Admission  Medication Sig Dispense Refill Last Dose  . benztropine (COGENTIN) 0.5 MG tablet Take 1 tablet (0.5 mg total) by mouth 2 (two) times daily. 180 tablet 1   . OLANZapine zydis (ZYPREXA) 10 MG disintegrating tablet Take 1 tablet (10 mg total) by mouth at bedtime. 90 tablet 1     Patient Stressors: Marital or family conflict Medication change or noncompliance  Patient Strengths: Technical sales engineer for treatment/growth  Treatment Modalities: Medication Management, Group therapy, Case management,  1 to 1 session with clinician, Psychoeducation, Recreational therapy.   Physician Treatment Plan for Primary Diagnosis: <principal problem not specified> Long Term Goal(s): Improvement in symptoms so as ready for discharge Improvement in symptoms so as ready for discharge   Short Term Goals: Ability to identify changes in lifestyle to reduce recurrence of condition will  improve Ability to verbalize feelings will improve Ability to disclose and discuss suicidal ideas Ability to identify and develop effective coping behaviors will improve Ability to identify changes in lifestyle to reduce recurrence of condition will improve Ability to verbalize feelings will improve Ability to disclose and discuss suicidal ideas Ability to demonstrate self-control will improve  Medication Management: Evaluate patient's response, side effects, and tolerance of medication regimen.  Therapeutic Interventions: 1 to 1 sessions, Unit Group sessions and Medication administration.  Evaluation of Outcomes: Progressing  Physician Treatment Plan for Secondary Diagnosis: Active Problems:   Schizophrenia ([place])  Long Term Goal(s): Improvement in symptoms so as ready for discharge Improvement in symptoms so as ready for discharge   Short Term Goals: Ability to identify changes in lifestyle to reduce recurrence of condition will improve Ability to verbalize feelings will improve Ability to disclose and discuss suicidal ideas Ability to identify and develop effective coping behaviors will improve Ability to identify changes in lifestyle to reduce recurrence of condition will improve Ability to verbalize feelings will improve Ability to disclose and discuss suicidal ideas Ability to demonstrate self-control will improve     Medication Management: Evaluate patient's response, side effects, and tolerance of medication regimen.  Therapeutic Interventions: 1 to 1 sessions, Unit Group sessions and Medication administration.  Evaluation of Outcomes: Progressing   RN Treatment Plan for Primary Diagnosis: <principal problem not specified> Long Term Goal(s): Knowledge of disease and therapeutic regimen to maintain health will improve  Short Term Goals: Ability to participate in decision making will improve, Ability  to verbalize feelings will improve, Ability to disclose and discuss  suicidal ideas, Ability to identify and develop effective coping behaviors will improve and Compliance with prescribed medications will improve  Medication Management: RN will administer medications as ordered by provider, will assess and evaluate patient's response and provide education to patient for prescribed medication. RN will report any adverse and/or side effects to prescribing provider.  Therapeutic Interventions: 1 on 1 counseling sessions, Psychoeducation, Medication administration, Evaluate responses to treatment, Monitor vital signs and CBGs as ordered, Perform/monitor CIWA, COWS, AIMS and Fall Risk screenings as ordered, Perform wound care treatments as ordered.  Evaluation of Outcomes: Progressing   LCSW Treatment Plan for Primary Diagnosis: <principal problem not specified> Long Term Goal(s): Safe transition to appropriate next level of care at discharge, Engage patient in therapeutic group addressing interpersonal concerns.  Short Term Goals: Engage patient in aftercare planning with referrals and resources and Increase skills for wellness and recovery  Therapeutic Interventions: Assess for all discharge needs, 1 to 1 time with Social worker, Explore available resources and support systems, Assess for adequacy in community support network, Educate family and significant other(s) on suicide prevention, Complete Psychosocial Assessment, Interpersonal group therapy.  Evaluation of Outcomes: Progressing   Progress in Treatment: Attending groups: No. Participating in groups: No. Taking medication as prescribed: Yes. Toleration medication: Yes. Family/Significant other contact made: Yes, individual(s) contacted:  pt's mother Patient understands diagnosis: No. Discussing patient identified problems/goals with staff: Yes. Medical problems stabilized or resolved: Yes. Denies suicidal/homicidal ideation: Yes. Issues/concerns per patient self-inventory: No. Other:   New  problem(s) identified: No, Describe:  none  New Short Term/Long Term Goal(s): Medication stabilization, elimination of SI thoughts, and development of a comprehensive mental wellness plan.   Patient Goals:  "be more able"  Discharge Plan or Barriers: CSW will continue to follow up for appropriate referrals and possible discharge planning  Reason for Continuation of Hospitalization: Anxiety Delusions  Depression Hallucinations Medication stabilization Suicidal ideation  Estimated Length of Stay: 3-5 days  Attendees: Patient: Lindsey Richmond 12/05/2019   Physician: Dr. Jeannine Kitten, MD 12/05/2019   Nursing:  12/05/2019   RN Care Manager: 12/05/2019   Social Worker: Enid Cutter, [place] 12/05/2019   Recreational Therapist:  12/05/2019   Other: Earlyne Iba, MSW intern  12/05/2019   Other: Rodell Perna, RN 12/05/2019   Other: 12/05/2019     Scribe for Treatment Team: Reynold Bowen, Student-Social Work 12/05/2019 10:57 AM

## 2019-12-05 NOTE — Progress Notes (Signed)
Pt attended spirituality group facilitated by Wilkie Aye, MDIv, BCC.  Group Description: Group focused on topic of hope. Patients participated in facilitated discussion around topic, connecting with one another around experiences and definitions for hope. Group members engaged with visual explorer photos, reflecting on what hope looks like for them today. Group engaged in discussion around how their definitions of hope are present today in hospital.  Modalities: Psycho-social ed, Adlerian, Narrative, MI  Patient Progress: Lindsey Richmond was present throughout group.  Flat affect, but brightened and responded when facilitator prompted.  She was engaged with topic, noting that she finds her brother helpful.  She chose a picture with northern lights when thinking about hope for today, but did not expound on why this was connected with hope for her.

## 2019-12-05 NOTE — Progress Notes (Signed)
   12/05/19 0056  Psych Admission Type (Psych Patients Only)  Admission Status Voluntary  Psychosocial Assessment  Patient Complaints Depression  Eye Contact Glaring  Facial Expression Blank;Flat  Affect Preoccupied;Blunted  Speech Slurred;Slow  Interaction Forwards little  Motor Activity Slow  Appearance/Hygiene Unremarkable  Behavior Characteristics Cooperative  Mood Depressed  Aggressive Behavior  Targets Self  Type of Behavior Weapon  Effect No apparent injury  Thought Process  Coherency Blocking  Content Preoccupation  Delusions None reported or observed  Perception WDL  Hallucination None reported or observed  Judgment Poor  Confusion None  Danger to Self  Current suicidal ideation? Denies  Self-Injurious Behavior No self-injurious ideation or behavior indicators observed or expressed   Agreement Not to Harm Self Yes  Description of Agreement verbal  Danger to Others  Danger to Others None reported or observed

## 2019-12-05 NOTE — Progress Notes (Signed)
   12/05/19 2100  COVID-19 Daily Checkoff  Have you had a fever (temp > 37.80C/100F)  in the past 24 hours?  No  If you have had runny nose, nasal congestion, sneezing in the past 24 hours, has it worsened? No  COVID-19 EXPOSURE  Have you traveled outside the state in the past 14 days? No  Have you been in contact with someone with a confirmed diagnosis of COVID-19 or PUI in the past 14 days without wearing appropriate PPE? No  Have you been living in the same home as a person with confirmed diagnosis of COVID-19 or a PUI (household contact)? No   

## 2019-12-05 NOTE — Progress Notes (Signed)
   12/05/19 1400  Psych Admission Type (Psych Patients Only)  Admission Status Voluntary  Psychosocial Assessment  Patient Complaints Depression  Eye Contact Glaring  Facial Expression Blank;Flat  Affect Preoccupied;Blunted  Speech Slurred;Slow  Interaction Forwards little  Motor Activity Slow  Appearance/Hygiene Unremarkable  Behavior Characteristics Cooperative  Mood Depressed  Aggressive Behavior  Targets Self  Type of Behavior Weapon  Effect No apparent injury  Thought Process  Coherency Blocking  Content Preoccupation  Delusions None reported or observed  Perception WDL  Hallucination None reported or observed  Judgment Poor  Confusion None  Danger to Self  Current suicidal ideation? Denies  Self-Injurious Behavior No self-injurious ideation or behavior indicators observed or expressed   Agreement Not to Harm Self Yes  Description of Agreement verbal  Danger to Others  Danger to Others None reported or observed

## 2019-12-05 NOTE — Progress Notes (Signed)
   12/05/19 2100  COVID-19 Daily Checkoff  Have you had a fever (temp > 37.80C/100F)  in the past 24 hours?  No  If you have had runny nose, nasal congestion, sneezing in the past 24 hours, has it worsened? No  COVID-19 EXPOSURE  Have you traveled outside the state in the past 14 days? No  Have you been in contact with someone with a confirmed diagnosis of COVID-19 or PUI in the past 14 days without wearing appropriate PPE? No  Have you been living in the same home as a person with confirmed diagnosis of COVID-19 or a PUI (household contact)? No

## 2019-12-06 MED ORDER — CLONIDINE HCL 0.2 MG PO TABS
0.2000 mg | ORAL_TABLET | Freq: Two times a day (BID) | ORAL | Status: DC
  Administered 2019-12-06 – 2019-12-09 (×3): 0.2 mg via ORAL
  Filled 2019-12-06 (×10): qty 1

## 2019-12-06 MED ORDER — TRAZODONE HCL 150 MG PO TABS
300.0000 mg | ORAL_TABLET | Freq: Every day | ORAL | Status: DC
  Administered 2019-12-06 – 2019-12-07 (×2): 300 mg via ORAL
  Filled 2019-12-06 (×3): qty 2

## 2019-12-06 NOTE — Progress Notes (Signed)
[facility] MD Progress Note  12/06/2019 8:28 AM Lindsey Richmond  MRN:  [id number] Subjective:   This is the latest of multiple psychiatric admissions and encounters for this 24 year old patient in our healthcare system she is known to have a chronic paranoid schizophrenic condition she re-presented on 3/3 as a walk-in patient. Her brother found her with a knife in bed she had made suicidal statements. She is followed by Vesta Mixer and receives paliperidone injections, last believed to be February 23.  Today the patient is a bit guarded and indifferent she continues to request discharge however there were concerns about safety she had been quite paranoid, sleeping with a knife and even threatening her own safety therefore we think we need to monitor her through the weekend. Denies all positive symptoms today however Principal Problem: Exacerbation of schizophrenic disorder Diagnosis: Active Problems:   Schizophrenia (HCC)  Total Time spent with patient: 20 minutes  Past Psychiatric History: See eval  Past Medical History: History reviewed. No pertinent past medical history. History reviewed. No pertinent surgical history. Family History:  Family History  Problem Relation Age of Onset  . Heart failure Mother   . Hypertension Mother   . Diabetes Father    Family Psychiatric  History: See eval Social History:  Social History   Substance and Sexual Activity  Alcohol Use No     Social History   Substance and Sexual Activity  Drug Use No    Social History   Socioeconomic History  . Marital status: Single    Spouse name: Not on file  . Number of children: Not on file  . Years of education: Not on file  . Highest education level: Not on file  Occupational History  . Not on file  Tobacco Use  . Smoking status: Never Smoker  . Smokeless tobacco: Never Used  Substance and Sexual Activity  . Alcohol use: No  . Drug use: No  . Sexual activity: Never    Birth control/protection: None  Other  Topics Concern  . Not on file  Social History Narrative  . Not on file   Social Determinants of Health   Financial Resource Strain:   . Difficulty of Paying Living Expenses: Not on file  Food Insecurity:   . Worried About Programme researcher, broadcasting/film/video in the Last Year: Not on file  . Ran Out of Food in the Last Year: Not on file  Transportation Needs:   . Lack of Transportation (Medical): Not on file  . Lack of Transportation (Non-Medical): Not on file  Physical Activity:   . Days of Exercise per Week: Not on file  . Minutes of Exercise per Session: Not on file  Stress:   . Feeling of Stress : Not on file  Social Connections:   . Frequency of Communication with Friends and Family: Not on file  . Frequency of Social Gatherings with Friends and Family: Not on file  . Attends Religious Services: Not on file  . Active Member of Clubs or Organizations: Not on file  . Attends Banker Meetings: Not on file  . Marital Status: Not on file   Additional Social History:    Pain Medications: See MAr Prescriptions: See MAR Over the Counter: See MAR History of alcohol / drug use?: No history of alcohol / drug abuse                    Sleep: Fair  Appetite:  Fair  Current Medications: Current Facility-Administered Medications  Medication Dose Route Frequency Provider Last Rate Last Admin  . benztropine (COGENTIN) tablet 0.5 mg  0.5 mg Oral BID Malvin Johns, MD   0.5 mg at 12/05/19 2058  . cloNIDine (CATAPRES) tablet 0.1 mg  0.1 mg Oral BID Malvin Johns, MD   0.1 mg at 12/05/19 1638  . hydrOXYzine (ATARAX/VISTARIL) tablet 25 mg  25 mg Oral TID PRN Malvin Johns, MD      . OLANZapine zydis (ZYPREXA) disintegrating tablet 10 mg  10 mg Oral QHS Malvin Johns, MD   10 mg at 12/05/19 2058  . OLANZapine zydis (ZYPREXA) disintegrating tablet 5 mg  5 mg Oral Daily Malvin Johns, MD   5 mg at 12/05/19 0824  . traZODone (DESYREL) tablet 50 mg  50 mg Oral QHS PRN Malvin Johns, MD         Lab Results: No results found for this or any previous visit (from the past 48 hour(s)).  Blood Alcohol level:  Lab Results  Component Value Date   ETH <10 10/23/2019   ETH <10 01/13/2019    Metabolic Disorder Labs: Lab Results  Component Value Date   HGBA1C 5.3 12/04/2019   MPG 105.41 12/04/2019   MPG 105.41 10/23/2019   Lab Results  Component Value Date   PROLACTIN 57.7 (H) 12/04/2019   PROLACTIN 75.7 (H) 10/23/2019   Lab Results  Component Value Date   CHOL 133 12/04/2019   TRIG 40 12/04/2019   HDL 25 (L) 12/04/2019   CHOLHDL 5.3 12/04/2019   VLDL 8 12/04/2019   LDLCALC 100 (H) 12/04/2019   LDLCALC 102 (H) 10/23/2019    Physical Findings: AIMS: Facial and Oral Movements Muscles of Facial Expression: None, normal Lips and Perioral Area: None, normal Jaw: None, normal Tongue: None, normal,Extremity Movements Upper (arms, wrists, hands, fingers): None, normal Lower (legs, knees, ankles, toes): None, normal, Trunk Movements Neck, shoulders, hips: None, normal, Overall Severity Severity of abnormal movements (highest score from questions above): None, normal Incapacitation due to abnormal movements: None, normal Patient's awareness of abnormal movements (rate only patient's report): No Awareness, Dental Status Current problems with teeth and/or dentures?: No Does patient usually wear dentures?: No  CIWA:  CIWA-Ar Total: 2 COWS:  COWS Total Score: 1  Musculoskeletal: Strength & Muscle Tone: within normal limits Gait & Station: normal Patient leans: N/A  Psychiatric Specialty Exam: Physical Exam  Review of Systems  Blood pressure (!) 138/126, pulse (!) 124, temperature 98.2 F (36.8 C), temperature source Oral, resp. rate 18, height 5\' 3"  (1.6 m), weight 115.2 kg, SpO2 100 %.Body mass index is 44.99 kg/m.  General Appearance: Casual  Eye Contact:  Good  Speech:  Slow  Volume:  Decreased  Mood:  Generally euthymic somewhat indifferent  Affect:   Congruent  Thought Process:  Irrelevant  Orientation:  Full (Time, Place, and Person)  Thought Content:  Denies positive symptoms  Suicidal Thoughts:  No  Homicidal Thoughts:  No  Memory:  Immediate;   Fair Recent;   Fair Remote;   Fair  Judgement:  Fair  Insight:  Fair  Psychomotor Activity:  Normal  Concentration:  Concentration: Fair and Attention Span: Fair  Recall:  of Knowledge:  Fair  Language:  Fair  Akathisia:  Negative  Handed:  Right  AIMS (if indicated):     Assets:  Communication Skills Desire for Improvement Intimacy Leisure Time  ADL's:  Intact  Cognition:  WNL  Sleep:  Number of Hours: 0.75     Treatment  Plan Summary: Daily contact with patient to assess and evaluate symptoms and progress in treatment and Medication management insomnia and hypertension, she did get a test dose of aripiprazole and tolerated it fine so in the future if the paliperidone is less successful she can be switched to aripiprazole will make a note of this at discharge but for now she did receive a paliperidone shot on 23 February.  No change in precautions  Johnn Hai, MD 12/06/2019, 8:28 AM

## 2019-12-06 NOTE — Progress Notes (Signed)
   12/06/19 2130  Psych Admission Type (Psych Patients Only)  Admission Status Voluntary  Psychosocial Assessment  Patient Complaints Depression  Eye Contact Fair  Facial Expression Flat;Blank  Affect Blunted;Preoccupied  Speech Logical/coherent  Interaction No initiation;Isolative  Motor Activity Slow  Appearance/Hygiene Unremarkable  Behavior Characteristics Cooperative;Appropriate to situation  Aggressive Behavior  Targets Self  Type of Behavior Weapon  Effect No apparent injury  Thought Process  Coherency Concrete thinking;Blocking  Content Preoccupation  Delusions None reported or observed  Perception WDL  Hallucination None reported or observed  Judgment Poor  Confusion None  Danger to Self  Current suicidal ideation? Denies  Self-Injurious Behavior No self-injurious ideation or behavior indicators observed or expressed   Agreement Not to Harm Self Yes  Description of Agreement Verbally contracts for safety  Danger to Others  Danger to Others None reported or observed

## 2019-12-06 NOTE — [facility] Suicide Risk Assessment (Signed)
[facility] INPATIENT:  Family/Significant Other Suicide Prevention Education  Suicide Prevention Education:  Contact Attempts: mother, Laelani Vasko [phone]), (name of family member/significant other) has been identified by the patient as the family member/significant other with whom the patient will be residing, and identified as the person(s) who will aid the patient in the event of a mental health crisis.  With written consent from the patient, two attempts were made to provide suicide prevention education, prior to and/or following the patient's discharge.  We were unsuccessful in providing suicide prevention education.  A suicide education pamphlet was given to the patient to share with family/significant other.  Date and time of first attempt:  12/06/2019  /  4:00 PM   VM could not be left Date and time of second attempt:  CSW team to continue efforts  Lynnell Chad 12/06/2019, 4:00 PM

## 2019-12-06 NOTE — [facility] Group Notes (Signed)
  [facility] LCSW Group Therapy Note  Date/Time:  12/06/2019 11:15AM-11:50AM  Type of Therapy and Topic:  Group Therapy:  Feelings About Hospitalization  Participation Level:  Active   Description of Group This process group involved patients discussing their feelings related to being hospitalized, as well as the benefits they see to being in the hospital.  These feelings and benefits were itemized.  The group then brainstormed specific ways in which they could seek those same benefits when they discharge and return home.  Therapeutic Goals 1. Patient will identify and describe positive and negative feelings related to hospitalization 2. Patient will verbalize benefits of hospitalization to themselves personally 3. Patients will brainstorm together ways they can obtain similar benefits in the outpatient setting, identify barriers to wellness and possible solutions  Summary of Patient Progress:  The patient actively engaged in check-in, sharing of feeling "unsatisfied". Patient further engaged in group discussion expressing her primary feelings about being hospitalized are that she can learn from mistakes and build trust while being hospitalized. Pt openly expressed negative feelings surrounding being hospitalized, sharing of not liking medication, feeling as though she does not need medication, and that she is not really mentally ill. Pt appeared to be responding to internal stimuli throughout group time. Pt proved unable to identify alternate means of receiving the same benefits from hospitalization in community settings. Pt proved receptive to feedback from group members and group facilitator.   Therapeutic Modalities Cognitive Behavioral Therapy Motivational Interviewing    Cyril Loosen, LCSWA 12/06/2019, 2:05 PM

## 2019-12-06 NOTE — Progress Notes (Signed)
   12/06/19 0900  Psych Admission Type (Psych Patients Only)  Admission Status Voluntary  Psychosocial Assessment  Patient Complaints Depression  Eye Contact Fair  Facial Expression Flat;Blank  Affect Blunted;Preoccupied  Speech Logical/coherent  Interaction Minimal;Guarded  Motor Activity Slow  Appearance/Hygiene Unremarkable  Behavior Characteristics Cooperative  Mood Depressed;Pleasant  Aggressive Behavior  Targets Self  Type of Behavior Weapon  Effect No apparent injury  Thought Process  Coherency Concrete thinking;Blocking  Content Preoccupation  Delusions None reported or observed  Perception WDL  Hallucination None reported or observed  Judgment Poor  Confusion None  Danger to Self  Current suicidal ideation? Denies  Self-Injurious Behavior No self-injurious ideation or behavior indicators observed or expressed   Agreement Not to Harm Self Yes  Description of Agreement Verbally contracts for safety  Danger to Others  Danger to Others None reported or observed

## 2019-12-06 NOTE — [facility] Group Notes (Signed)
Adult Psychoeducational Group Note  Date:  12/06/2019 Time:  10:51 AM  Group Topic/Focus:  Goals Group:   The focus of this group is to help patients establish daily goals to achieve during treatment and discuss how the patient can incorporate goal setting into their daily lives to aide in recovery.  Participation Level:  Did Not Attend  Lindsey Richmond 12/06/2019, 10:51 AM

## 2019-12-06 NOTE — Progress Notes (Signed)
   12/06/19 2230  COVID-19 Daily Checkoff  Have you had a fever (temp > 37.80C/100F)  in the past 24 hours?  No  If you have had runny nose, nasal congestion, sneezing in the past 24 hours, has it worsened? No  COVID-19 EXPOSURE  Have you traveled outside the state in the past 14 days? No  Have you been in contact with someone with a confirmed diagnosis of COVID-19 or PUI in the past 14 days without wearing appropriate PPE? No  Have you been living in the same home as a person with confirmed diagnosis of COVID-19 or a PUI (household contact)? No  Have you been diagnosed with COVID-19? No   

## 2019-12-07 DIAGNOSIS — F209 Schizophrenia, unspecified: Secondary | ICD-10-CM

## 2019-12-07 MED ORDER — BENZOYL PEROXIDE 5 % EX GEL
Freq: Two times a day (BID) | CUTANEOUS | Status: DC
  Filled 2019-12-07: qty 42.5

## 2019-12-07 NOTE — Progress Notes (Signed)
Adult Psychoeducational Group Note  Date:  12/07/2019 Time:  11:11 PM  Group Topic/Focus:  Wrap-Up Group:   The focus of this group is to help patients review their daily goal of treatment and discuss progress on daily workbooks.  Participation Level:  Minimal  Participation Quality:  Appropriate  Affect:  Flat  Cognitive:  Appropriate  Insight: Limited  Engagement in Group:  Improving  Modes of Intervention:  Discussion  Additional Comments:  Pt stated her goals for today was to focus on her treatment plan and talk with her doctor about her medication issues. Pt stated she accomplished her goals today. Pt stated the plan is for her to be discharge on Monday. Pt stated her relationship with her family has improved since she was admitted.  Pt rated her overall day a 7 out of 10. Pt stated her appetite was improved today. Pt stated her sleep last night was pretty good. Pt stated she was in no physical pain today.  Pt deny auditory or visual hallucinations. Pt denies thoughts of harming herself or others. Pt stated she would alert staff if anything changes.  Felipa Furnace 12/07/2019, 11:11 PM

## 2019-12-07 NOTE — Progress Notes (Signed)
   12/07/19 2110  COVID-19 Daily Checkoff  Have you had a fever (temp > 37.80C/100F)  in the past 24 hours?  No  If you have had runny nose, nasal congestion, sneezing in the past 24 hours, has it worsened? No  COVID-19 EXPOSURE  Have you traveled outside the state in the past 14 days? No  Have you been in contact with someone with a confirmed diagnosis of COVID-19 or PUI in the past 14 days without wearing appropriate PPE? No  Have you been living in the same home as a person with confirmed diagnosis of COVID-19 or a PUI (household contact)? No  Have you been diagnosed with COVID-19? No

## 2019-12-07 NOTE — [facility] Group Notes (Signed)
Adult Psychoeducational Group Note  Date:  12/07/2019 Time:  7:12 PM  Group Topic/Focus:  Progressive Relaxation:  Learning how to deep breathe and to tighten and relax muscles in the bocy. Also used is Imagery of being on a beach  Participation Level:  Minimal  Participation Quality:  Appropriate  Affect:  Flat  Cognitive:  Disorganized  Insight: Lacking  Engagement in Group:  Improving  Modes of Intervention:  Activity and Support  Additional Comments:  Pt was able to share when it was her turn.  Dione Housekeeper 12/07/2019, 7:12 PM

## 2019-12-07 NOTE — [facility] Suicide Risk Assessment (Signed)
[facility] INPATIENT:  Family/Significant Other Suicide Prevention Education  Suicide Prevention Education:  Contact Attempts: mother, Aida Lemaire [phone]) , (name of family member/significant other) has been identified by the patient as the family member/significant other with whom the patient will be residing, and identified as the person(s) who will aid the patient in the event of a mental health crisis.  With written consent from the patient, two attempts were made to provide suicide prevention education, prior to and/or following the patient's discharge.  We were unsuccessful in providing suicide prevention education.  A suicide education pamphlet was given to the patient to share with family/significant other.  Date and time of first attempt:  12/06/2019  / 4:00 PM   VM could not be left Date and time of second attempt:  12/07/2019  /  4:15 PM   No answer, just disconnected  Lindsey Richmond 12/07/2019, 4:15 PM

## 2019-12-07 NOTE — [facility] Group Notes (Signed)
[facility] LCSW Group Therapy Note  Date/Time:  12/07/2019 11:15-12:00PM  Type of Therapy and Topic:  Group Therapy:  Healthy and Unhealthy Supports  Participation Level:  Active   Description of Group:  Patients in this group were introduced to the idea of adding a variety of healthy supports to address the various needs in their lives.Patients discussed what additional healthy supports could be helpful in their recovery and wellness after discharge in order to prevent future hospitalizations.   An emphasis was placed on using counselor, doctor, therapy groups, 12-step groups, and problem-specific support groups to expand supports.  They also worked as a group on developing a specific plan for several patients to deal with unhealthy supports through boundary-setting, psychoeducation with loved ones, and even termination of relationships.   Therapeutic Goals:   1)  discuss importance of adding supports to stay well once out of the hospital  2)  compare healthy versus unhealthy supports and identify some examples of each  3)  generate ideas and descriptions of healthy supports that can be added  4)  offer mutual support about how to address unhealthy supports  5)  encourage active participation in and adherence to discharge plan    Summary of Patient Progress:  The patient actively engaged in introductory check-in, sharing of feeling "alright, actually great". Pt actively engaged in discussion of what support means to her and how this can benefit her. Pt detailed supports as "being with like-minded people that you know" and acknowledged that she had previously noted her family as being unhealthy supports. The patient expressed a willingness to add the act of journaling as support(s) and eliminate friendships with negative individuals she considers to be toxic in efforts to help in her recovery journey. Pt proved receptive to alternate group members input and feedback from group  facilitator.   Therapeutic Modalities:   Motivational Interviewing Brief Solution-Focused Therapy  Leisa Lenz, Theresia Majors 12/07/2019 12:47PM

## 2019-12-07 NOTE — Progress Notes (Signed)
Patient has been isolative to her room tonight. Writer encouraged her to come have snack before medications. She sat in the dayroom briefly watching tv. We spoke 1:1 and she was scratching at her face. Writer asked if she had some type of medicine for her acne and she replied no. Received an order for ointment for her face. She talked about being in school and wanting to become a Clinical research associate. She did report that she doesn't like her father but could not really give a reason. Writer encouraged her to try and develop a more positive relationship with her father if possible. She opened up more with Clinical research associate on tonight.

## 2019-12-07 NOTE — Progress Notes (Signed)
[facility] MD Progress Note  12/07/2019 10:08 AM Lindsey Richmond  MRN:  [id number] Subjective:  "I'm alright."  Lindsey Richmond found lying in bed. She presents with flat affect, poverty of content, and thought blocking. She pauses before answering questions and sometimes does not answer at all. She is oriented x3. She does admit that she was thinking about suicide prior to admission and slept with a knife under her pillow. She is unable to identify triggers for suicidal thoughts. She denies current SI. Denies HI/AVH. She appears preoccupied at times. Denies paranoia. No delusional or religious thought content expressed. She reports good sleep and appetite. No agitated or disruptive behaviors.  From admission H&P: This is the latest of multiple psychiatric admissions and encounters for this 24 year old patient in our healthcare system she is known to have a chronic paranoid schizophrenic condition she re-presented on 3/3 as a walk-in patient.  Her brother found her with a knife in bed she had made suicidal statements.   Principal Problem: <principal problem not specified> Diagnosis: Active Problems:   Schizophrenia (Lindsey Richmond)  Total Time spent with patient: 15 minutes  Past Psychiatric History: See admission H&P  Past Medical History: History reviewed. No pertinent past medical history. History reviewed. No pertinent surgical history. Family History:  Family History  Problem Relation Age of Onset  . Heart failure Mother   . Hypertension Mother   . Diabetes Father    Family Psychiatric  History: See admission H&P Social History:  Social History   Substance and Sexual Activity  Alcohol Use No     Social History   Substance and Sexual Activity  Drug Use No    Social History   Socioeconomic History  . Marital status: Single    Spouse name: Not on file  . Number of children: Not on file  . Years of education: Not on file  . Highest education level: Not on file  Occupational History  . Not on file   Tobacco Use  . Smoking status: Never Smoker  . Smokeless tobacco: Never Used  Substance and Sexual Activity  . Alcohol use: No  . Drug use: No  . Sexual activity: Never    Birth control/protection: None  Other Topics Concern  . Not on file  Social History Narrative  . Not on file   Social Determinants of Health   Financial Resource Strain:   . Difficulty of Paying Living Expenses: Not on file  Food Insecurity:   . Worried About Charity fundraiser in the Last Year: Not on file  . Ran Out of Food in the Last Year: Not on file  Transportation Needs:   . Lack of Transportation (Medical): Not on file  . Lack of Transportation (Non-Medical): Not on file  Physical Activity:   . Days of Exercise per Week: Not on file  . Minutes of Exercise per Session: Not on file  Stress:   . Feeling of Stress : Not on file  Social Connections:   . Frequency of Communication with Friends and Family: Not on file  . Frequency of Social Gatherings with Friends and Family: Not on file  . Attends Religious Services: Not on file  . Active Member of Clubs or Organizations: Not on file  . Attends Archivist Meetings: Not on file  . Marital Status: Not on file   Additional Social History:    Pain Medications: See MAr Prescriptions: See MAR Over the Counter: See MAR History of alcohol / drug use?: No history  of alcohol / drug abuse                    Sleep: Good  Appetite:  Good  Current Medications: Current Facility-Administered Medications  Medication Dose Route Frequency Provider Last Rate Last Admin  . benztropine (COGENTIN) tablet 0.5 mg  0.5 mg Oral BID Malvin Johns, MD   0.5 mg at 12/06/19 2130  . cloNIDine (CATAPRES) tablet 0.2 mg  0.2 mg Oral BID Malvin Johns, MD   Stopped at 12/07/19 [phone]  . hydrOXYzine (ATARAX/VISTARIL) tablet 25 mg  25 mg Oral TID PRN Malvin Johns, MD      . OLANZapine zydis (ZYPREXA) disintegrating tablet 10 mg  10 mg Oral QHS Malvin Johns, MD    10 mg at 12/06/19 2130  . OLANZapine zydis (ZYPREXA) disintegrating tablet 5 mg  5 mg Oral Daily Malvin Johns, MD   5 mg at 12/07/19 0756  . traZODone (DESYREL) tablet 300 mg  300 mg Oral QHS Malvin Johns, MD   300 mg at 12/06/19 2130    Lab Results: No results found for this or any previous visit (from the past 48 hour(s)).  Blood Alcohol level:  Lab Results  Component Value Date   ETH <10 10/23/2019   ETH <10 01/13/2019    Metabolic Disorder Labs: Lab Results  Component Value Date   HGBA1C 5.3 12/04/2019   MPG 105.41 12/04/2019   MPG 105.41 10/23/2019   Lab Results  Component Value Date   PROLACTIN 57.7 (H) 12/04/2019   PROLACTIN 75.7 (H) 10/23/2019   Lab Results  Component Value Date   CHOL 133 12/04/2019   TRIG 40 12/04/2019   HDL 25 (L) 12/04/2019   CHOLHDL 5.3 12/04/2019   VLDL 8 12/04/2019   LDLCALC 100 (H) 12/04/2019   LDLCALC 102 (H) 10/23/2019    Physical Findings: AIMS: Facial and Oral Movements Muscles of Facial Expression: None, normal Lips and Perioral Area: None, normal Jaw: None, normal Tongue: None, normal,Extremity Movements Upper (arms, wrists, hands, fingers): None, normal Lower (legs, knees, ankles, toes): None, normal, Trunk Movements Neck, shoulders, hips: None, normal, Overall Severity Severity of abnormal movements (highest score from questions above): None, normal Incapacitation due to abnormal movements: None, normal Patient's awareness of abnormal movements (rate only patient's report): No Awareness, Dental Status Current problems with teeth and/or dentures?: No Does patient usually wear dentures?: No  CIWA:  CIWA-Ar Total: 2 COWS:  COWS Total Score: 1  Musculoskeletal: Strength & Muscle Tone: within normal limits Gait & Station: normal Patient leans: N/A  Psychiatric Specialty Exam: Physical Exam  Nursing note and vitals reviewed. Constitutional: She is oriented to person, place, and time. She appears well-developed and  well-nourished.  Cardiovascular: Normal rate.  Respiratory: Effort normal.  Neurological: She is alert and oriented to person, place, and time.    Review of Systems  Constitutional: Negative.   Respiratory: Negative for cough and shortness of breath.   Psychiatric/Behavioral: Negative for agitation, behavioral problems, dysphoric mood, hallucinations, self-injury, sleep disturbance and suicidal ideas. The patient is not nervous/anxious and is not hyperactive.     Blood pressure 105/67, pulse 81, temperature 98 F (36.7 C), temperature source Oral, resp. rate 18, height 5\' 3"  (1.6 m), weight 115.2 kg, SpO2 100 %.Body mass index is 44.99 kg/m.  General Appearance: Disheveled  Eye Contact:  Minimal  Speech:  Slow  Volume:  Decreased  Mood:  Euthymic  Affect:  Non-Congruent and Depressed  Thought Process:  Coherent  Orientation:  Full (Time, Place, and Person)  Thought Content:  Poverty of content  Suicidal Thoughts:  No  Homicidal Thoughts:  No  Memory:  Immediate;   Fair Recent;   Fair  Judgement:  Fair  Insight:  Lacking  Psychomotor Activity:  Decreased  Concentration:  Concentration: Fair and Attention Span: Fair  Recall:  [employer] of Knowledge:  Fair  Language:  Good  Akathisia:  No  Handed:  Right  AIMS (if indicated):     Assets:  Housing Physical Health Social Support  ADL's:  Intact  Cognition:  WNL  Sleep:  Number of Hours: 6.5     Treatment Plan Summary: Daily contact with patient to assess and evaluate symptoms and progress in treatment and Medication management   Continue inpatient hospitalization.  Continue Zyprexa 5 mg QAM, 10 mg PO QHS for psychosis Continue Cogentin 0.5 mg PO BID for EPS Continue clonidine 0.2 mg PO BID for HTN Continue Vistaril 25 mg PO TID PRN anxiety Continue trazodone 300 mg PO QHS for insomnia  Patient will participate in the therapeutic group milieu.  Discharge disposition in progress.   Aldean Baker, NP 12/07/2019,  10:08 AM

## 2019-12-07 NOTE — Plan of Care (Signed)
Progress note  D: pt found in bed; compliant with medication administration. Pt denies any physical symptoms or complaints. Pt continues to be minimal and reclusive to their room. Pt is pleasant though. Pt denies si/hi/ah/vh and verbally agrees to approach staff if these become apparent or before harming themself/others while at [facility].  A: Pt provided support and encouragement. Pt given medication per protocol and standing orders. Q15m safety checks implemented and continued.  R: Pt safe on the unit. Will continue to monitor.  Pt progressing in the following metrics  Problem: Education: Goal: Knowledge of [facility] Education information/materials will improve Outcome: Progressing Goal: Emotional status will improve Outcome: Progressing Goal: Mental status will improve Outcome: Progressing Goal: Verbalization of understanding the information provided will improve Outcome: Progressing

## 2019-12-08 LAB — URINALYSIS, COMPLETE (UACMP) WITH MICROSCOPIC
Bilirubin Urine: NEGATIVE
Glucose, UA: NEGATIVE mg/dL
Ketones, ur: NEGATIVE mg/dL
Nitrite: NEGATIVE
Protein, ur: NEGATIVE mg/dL
Specific Gravity, Urine: 1.019 (ref 1.005–1.030)
WBC, UA: >50 WBC/hpf — ABNORMAL HIGH (ref 0–5)
pH: 6 (ref 5.0–8.0)

## 2019-12-08 LAB — RAPID URINE DRUG SCREEN, HOSP PERFORMED
Amphetamines: NOT DETECTED
Barbiturates: NOT DETECTED
Benzodiazepines: NOT DETECTED
Cocaine: NOT DETECTED
Opiates: NOT DETECTED
Tetrahydrocannabinol: NOT DETECTED

## 2019-12-08 LAB — PREGNANCY, URINE: Preg Test, Ur: NEGATIVE

## 2019-12-08 MED ORDER — TRAZODONE HCL 150 MG PO TABS
150.0000 mg | ORAL_TABLET | Freq: Every evening | ORAL | Status: DC | PRN
  Filled 2019-12-08: qty 7

## 2019-12-08 NOTE — Progress Notes (Addendum)
[facility] MD Progress Note  12/08/2019 11:16 AM Lindsey Richmond  MRN:  [id number] Subjective:  "Things are going well, the medications are helping."  From admission H&P: This is the latest of multiple psychiatric admissions and encounters for this 24 year old patient in our healthcare system she is known to have a chronic paranoid schizophrenic condition she re-presented on 3/3 as a walk-in patient. Her brother found her with a knife in bed she had made suicidal statements.   Principal Problem: <principal problem not specified> Diagnosis: Active Problems:   Schizophrenia ([facility])  Total Time spent with patient: 15 minutes  Past Psychiatric History: See admission H&P  Past Medical History: History reviewed. No pertinent past medical history. History reviewed. No pertinent surgical history. Family History:  Family History  Problem Relation Age of Onset  . Heart failure Mother   . Hypertension Mother   . Diabetes Father    Family Psychiatric  History: See admission H&P Social History:  Social History   Substance and Sexual Activity  Alcohol Use No     Social History   Substance and Sexual Activity  Drug Use No    Social History   Socioeconomic History  . Marital status: Single    Spouse name: Not on file  . Number of children: Not on file  . Years of education: Not on file  . Highest education level: Not on file  Occupational History  . Not on file  Tobacco Use  . Smoking status: Never Smoker  . Smokeless tobacco: Never Used  Substance and Sexual Activity  . Alcohol use: No  . Drug use: No  . Sexual activity: Never    Birth control/protection: None  Other Topics Concern  . Not on file  Social History Narrative  . Not on file   Social Determinants of Health   Financial Resource Strain:   . Difficulty of Paying Living Expenses: Not on file  Food Insecurity:   . Worried About Programme researcher, broadcasting/film/video in the Last Year: Not on file  . Ran Out of Food in the Last Year: Not on  file  Transportation Needs:   . Lack of Transportation (Medical): Not on file  . Lack of Transportation (Non-Medical): Not on file  Physical Activity:   . Days of Exercise per Week: Not on file  . Minutes of Exercise per Session: Not on file  Stress:   . Feeling of Stress : Not on file  Social Connections:   . Frequency of Communication with Friends and Family: Not on file  . Frequency of Social Gatherings with Friends and Family: Not on file  . Attends Religious Services: Not on file  . Active Member of Clubs or Organizations: Not on file  . Attends Banker Meetings: Not on file  . Marital Status: Not on file   Additional Social History:    Pain Medications: See MAr Prescriptions: See MAR Over the Counter: See MAR History of alcohol / drug use?: No history of alcohol / drug abuse                    Sleep: Good  Appetite:  Good  Current Medications: Current Facility-Administered Medications  Medication Dose Route Frequency Provider Last Rate Last Admin  . benzoyl peroxide 5 % gel   Topical BID [name] A, NP      . cloNIDine (CATAPRES) tablet 0.2 mg  0.2 mg Oral BID Malvin Johns, MD   0.2 mg at 12/08/19 0818  .  hydrOXYzine (ATARAX/VISTARIL) tablet 25 mg  25 mg Oral TID PRN Johnn Hai, MD      . OLANZapine zydis (ZYPREXA) disintegrating tablet 10 mg  10 mg Oral QHS Johnn Hai, MD   10 mg at 12/07/19 2102  . OLANZapine zydis (ZYPREXA) disintegrating tablet 5 mg  5 mg Oral Daily Johnn Hai, MD   5 mg at 12/08/19 0818  . traZODone (DESYREL) tablet 150 mg  150 mg Oral QHS PRN Reneta Niehaus, Myer Peer, MD        Lab Results: No results found for this or any previous visit (from the past 48 hour(s)).  Blood Alcohol level:  Lab Results  Component Value Date   ETH <10 10/23/2019   ETH <10 13/24/4010    Metabolic Disorder Labs: Lab Results  Component Value Date   HGBA1C 5.3 12/04/2019   MPG 105.41 12/04/2019   MPG 105.41 10/23/2019   Lab Results   Component Value Date   PROLACTIN 57.7 (H) 12/04/2019   PROLACTIN 75.7 (H) 10/23/2019   Lab Results  Component Value Date   CHOL 133 12/04/2019   TRIG 40 12/04/2019   HDL 25 (L) 12/04/2019   CHOLHDL 5.3 12/04/2019   VLDL 8 12/04/2019   LDLCALC 100 (H) 12/04/2019   LDLCALC 102 (H) 10/23/2019    Physical Findings: AIMS: Facial and Oral Movements Muscles of Facial Expression: None, normal Lips and Perioral Area: None, normal Jaw: None, normal Tongue: None, normal,Extremity Movements Upper (arms, wrists, hands, fingers): None, normal Lower (legs, knees, ankles, toes): None, normal, Trunk Movements Neck, shoulders, hips: None, normal, Overall Severity Severity of abnormal movements (highest score from questions above): None, normal Incapacitation due to abnormal movements: None, normal Patient's awareness of abnormal movements (rate only patient's report): No Awareness, Dental Status Current problems with teeth and/or dentures?: No Does patient usually wear dentures?: No  CIWA:  CIWA-Ar Total: 2 COWS:  COWS Total Score: 1  Musculoskeletal: Strength & Muscle Tone: within normal limits Gait & Station: normal Patient leans: N/A  Psychiatric Specialty Exam: Physical Exam  Nursing note and vitals reviewed. Constitutional: She is oriented to person, place, and time. She appears well-developed.  HENT:  Head: Normocephalic.  Cardiovascular: Normal rate.  Respiratory: Effort normal.  Musculoskeletal:        General: Normal range of motion.     Cervical back: Normal range of motion.  Neurological: She is alert and oriented to person, place, and time.  Psychiatric: She has a normal mood and affect. Her speech is normal and behavior is normal. Judgment and thought content normal. Cognition and memory are normal.    Review of Systems  Constitutional: Negative.   HENT: Negative.   Eyes: Negative.   Respiratory: Negative.   Cardiovascular: Negative.   Gastrointestinal: Negative.    Genitourinary: Negative.   Musculoskeletal: Negative.   Skin: Negative.   Neurological: Negative.     Blood pressure 118/73, pulse 80, temperature 98.1 F (36.7 C), temperature source Oral, resp. rate 18, height 5\' 3"  (1.6 m), weight 115.2 kg, SpO2 100 %.Body mass index is 44.99 kg/m.  General Appearance: Fairly Groomed  Eye Contact:  Minimal  Speech:  Clear and Coherent  Volume:  Normal  Mood:  Euthymic  Affect:  Flat  Thought Process:  Coherent and Descriptions of Associations: Intact  Orientation:  Full (Time, Place, and Person)  Thought Content:  Logical  Suicidal Thoughts:  No  Homicidal Thoughts:  No  Memory:  Immediate;   Good  Judgement:  Fair  Insight:  Fair  Psychomotor Activity:  Normal  Concentration:  Concentration: Good and Attention Span: Good  Recall:  Good  Fund of Knowledge:  Good  Language:  Good  Akathisia:  No  Handed:  Right  AIMS (if indicated):     Assets:  Architect Housing Intimacy Leisure Time Physical Health Resilience Social Support  ADL's:  Intact  Cognition:  WNL  Sleep:  Number of Hours: 6     Treatment Plan Summary: Daily contact with patient to assess and evaluate symptoms and progress in treatment  And medication management. Continue Zyprexa 5 mg QAM, 10 mg PO QHS for psychosis Continue Cogentin 0.5 mg PO BID for EPS Continue clonidine 0.2 mg PO BID for HTN Continue Vistaril 25 mg PO TID PRN anxiety Decrease trazodone to 150mg  PO QHS for insomnia Patient will continue to participate in the therapeutic group milieu.  Continue inpatient hospitalization.  , FNP 12/08/2019, 11:16 AM   Patient seen along with NP.  24 year old female, history of schizophrenia, admitted on 3/4.  She presented for being found with a knife and making suicidal statements.  Was noted to be flat in affect and thought blocking on admission. Today patient reports she is feeling "okay". Affect is  blunted/guarded, although tends to improve somewhat during session.  She denies hallucinations. She denies suicidal ideations.  She minimizes depression or neurovegetative symptoms.She is hoping for discharge soon.  Denies medication side effects. As reviewed with RN staff, she tends to isolate in her room and when out on unit her interactions with peers are limited. No agitated or disruptive behaviors . RN reports that patient has attempted to "cheek" medications at times . Plan- continue Zyprexa ( Zydis) 5 mgrs QAM and 10 mgrs QHS . Decrease Trazodone to 100 mgrs QHS PRN for insomnia. Will order pregnancy test  Dr. 5/4 will follow up in AM  F Zakiya Sporrer MD

## 2019-12-08 NOTE — Progress Notes (Signed)
Recreation Therapy Notes  Date: 3.8.21 Time: 1000 Location: 500 Hall Dayroom  Group Topic: Coping Skills  Goal Area(s) Addresses:  Patient will identify positive coping skills. Patient will identify benefit of using positive coping skills post d/c.  Behavioral Response:  Engaged  Intervention: Worksheet  Activity: Healthy vs. Unhealthy Coping Strategies.  Patients were to identify a problem they are currently dealing with.  Patients then identified the unhealthy coping strategies used and the consequences of those coping strategies.  Patients then identified positive coping strategies, expected outcomes and barriers to using healthy coping strategies.  Education: Pharmacologist, Building control surveyor.   Education Outcome: Acknowledges understanding/In group clarification offered/Needs additional education.   Clinical Observations/Feedback: Pt identified current problem as communication and psyche.  Pt identified unhealthy coping strategies as bottling up emotions and withdrawal.  Pt identified consequences as going crazy/mad and not getting to goals quicker and no friends.  Pt expressed healthy coping strategies were exercising/punching bag, sports and writing.  Pt stated expected outcomes were getting fit/in shape, build teamwork and having a creative outlet.  Pt was unable to identify barriers to using healthy coping strategies.    Caroll Rancher, LRT/CTRS    Caroll Rancher A 12/08/2019 12:07 PM

## 2019-12-08 NOTE — [facility] Group Notes (Signed)
LCSW Group Therapy Notes 12/08/2019 1:48 PM  Type of Therapy and Topic: Group Therapy: Overcoming Obstacles  Participation Level: Did Not Attend  Description of Group:  In this group patients will be encouraged to explore what they see as obstacles to their own wellness and recovery. They will be guided to discuss their thoughts, feelings, and behaviors related to these obstacles. The group will process together ways to cope with barriers, with attention given to specific choices patients can make. Each patient will be challenged to identify changes they are motivated to make in order to overcome their obstacles. This group will be process-oriented, with patients participating in exploration of their own experiences as well as giving and receiving support and challenge from other group members.  Therapeutic Goals: 1. Patient will identify personal and current obstacles as they relate to admission. 2. Patient will identify barriers that currently interfere with their wellness or overcoming obstacles.  3. Patient will identify feelings, thought process and behaviors related to these barriers. 4. Patient will identify two changes they are willing to make to overcome these obstacles:   Summary of Patient Progress Invited, chose not to attend.  Therapeutic Modalities:  Cognitive Behavioral Therapy Solution Focused Therapy Motivational Interviewing Relapse Prevention Therapy  Billie Trager, MSW, LCSWA 12/08/2019 1:48 PM  

## 2019-12-08 NOTE — Progress Notes (Signed)
   12/08/19 2354  Psych Admission Type (Psych Patients Only)  Admission Status Voluntary  Psychosocial Assessment  Patient Complaints None  Eye Contact Fair  Facial Expression Sullen;Sad  Affect Depressed;Sad;Sullen  Speech Logical/coherent;Soft  Interaction Cautious;Guarded;Minimal;Isolative  Motor Activity Slow  Appearance/Hygiene Unremarkable  Behavior Characteristics Cooperative  Mood Pleasant;Depressed  Aggressive Behavior  Effect No apparent injury  Thought Process  Coherency Blocking  Content Preoccupation  Delusions None reported or observed  Perception WDL  Hallucination None reported or observed  Judgment Poor  Confusion WDL  Danger to Self  Current suicidal ideation? Denies  Danger to Others  Danger to Others None reported or observed   Pt denies hallucinations or voices but is talking to herself and pointing while at med window.

## 2019-12-09 DIAGNOSIS — F200 Paranoid schizophrenia: Principal | ICD-10-CM

## 2019-12-09 MED ORDER — CLONIDINE HCL 0.1 MG PO TABS
0.1000 mg | ORAL_TABLET | Freq: Two times a day (BID) | ORAL | Status: DC
  Filled 2019-12-09 (×3): qty 14

## 2019-12-09 MED ORDER — BENZTROPINE MESYLATE 0.5 MG PO TABS: 0.5000 mg | ORAL_TABLET | Freq: Two times a day (BID) | ORAL | 1 refills | Status: DC

## 2019-12-09 MED ORDER — TRAZODONE HCL 150 MG PO TABS: 150.0000 mg | ORAL_TABLET | Freq: Every evening | ORAL | 1 refills | Status: DC | PRN

## 2019-12-09 MED ORDER — CLONIDINE HCL 0.1 MG PO TABS: 0.1000 mg | ORAL_TABLET | Freq: Two times a day (BID) | ORAL | 2 refills | Status: DC

## 2019-12-09 MED ORDER — OLANZAPINE 10 MG PO TBDP: 10.0000 mg | ORAL_TABLET | Freq: Every day | ORAL | 1 refills | Status: DC

## 2019-12-09 NOTE — Progress Notes (Signed)
Recreation Therapy Notes  INPATIENT RECREATION TR PLAN  Patient Details Name: Lindsey Richmond MRN: [id number] DOB: June 02, 1996 Today's Date: 12/09/2019  Rec Therapy Plan Is patient appropriate for Therapeutic Recreation?: Yes Treatment times per week: about 3 days Estimated Length of Stay: 5-7 days TR Treatment/Interventions: Group participation (Comment)  Discharge Criteria Pt will be discharged from therapy if:: Discharged Treatment plan/goals/alternatives discussed and agreed upon by:: Patient/family  Discharge Summary Short term goals set: See patient care plan Short term goals met: Complete Progress toward goals comments: Groups attended Which groups?: Coping skills, Leisure education, Other (Comment)(Team building) Reason goals not met: None Therapeutic equipment acquired: N/A Reason patient discharged from therapy: Discharge from hospital Pt/family agrees with progress & goals achieved: Yes Date patient discharged from therapy: 12/09/19     Victorino Sparrow, LRT/CTRS  [name], [place] 12/09/2019, 11:30 AM

## 2019-12-09 NOTE — Progress Notes (Signed)
  [facility] Adult Case Management Discharge Plan :  Will you be returning to the same living situation after discharge:  Yes,  returning home. At discharge, do you have transportation home?: Yes,  using [employer]/Lyft. Do you have the ability to pay for your medications: No. Referred to [facility].  Release of information consent forms completed and in the chart.  Patient to Follow up at: Follow-up Information    [facility] Follow up on 12/11/2019.   Why: You are scheduled for an appointment for medication management and therapy on 12/11/19 at 4:00 pm. You also have an appointment on 12/23/19 at 9:45 am. These will be a virtual tele-health appointments.  Contact information: [address] [place] [address] [phone]           Next level of care provider has access to [facility] Link:no  Safety Planning and Suicide Prevention discussed: Yes,  with patient. Two attempts to reach mother.  Have you used any form of tobacco in the last 30 days? (Cigarettes, Smokeless Tobacco, Cigars, and/or Pipes): No  Has patient been referred to the Quitline?: N/A patient is not a smoker  Patient has been referred for addiction treatment: Yes  Darreld Mclean, LCSWA 12/09/2019, 9:33 AM

## 2019-12-09 NOTE — Progress Notes (Signed)
Recreation Therapy Notes  Date: 3.9.21 Time: 1000 Location: 500 Hall Dayroom   Group Topic: Leisure Education  Goal Area(s) Addresses:  Patient will identify positive leisure activities.  Patient will identify one positive benefit of participation in leisure activities.   Behavioral Response: Engaged  Intervention: Leisure Group Game  Activity:  Keep It Contractor.  LRT and patients were to keep the beach in motion as long as possible without it coming to a stop.  LRT timed the group to see how long the group could keep the ball in motion.  Each participant was to remain seated unless the ball was hit out of reach of the next closest participant.  Each time the ball comes to a complete stop, LRT would start the time over.  Education:  Leisure Education, Building control surveyor  Education Outcome: Acknowledges education/In group clarification offered/Needs additional education  Clinical Observations/Feedback: Pt was very active.  Pt seemed to smile at certain points during the activity.  Pt worked well with peers.  Pt was continually adjusting how hard she was hitting the ball.  Pt left before end of group for discharge.    Caroll Rancher, LRT/CTRS    Caroll Rancher A 12/09/2019 10:56 AM

## 2019-12-09 NOTE — Progress Notes (Signed)
Pt discharged to lobby. Pt was stable and appreciative at that time. All papers, samples and prescriptions were given and valuables returned. Verbal understanding expressed. Denies SI/HI and A/VH. Pt given opportunity to express concerns and ask questions.  

## 2019-12-09 NOTE — Discharge Summary (Signed)
Physician Discharge Summary Note  Patient:  Lindsey Richmond is an 24 y.o., female MRN:  [id number] DOB:  [birth year] Patient phone:  [phone] (home)  Patient address:   [address] [place] [address],  Total Time spent with patient: 45 minutes  Date of Admission:  12/03/2019 Date of Discharge: 12/09/2019  Reason for Admission:   This is the latest of multiple psychiatric admissions and encounters for this 24 year old patient in our healthcare system she is known to have a chronic paranoid schizophrenic condition she re-presented on 3/3 as a walk-in patient.  Her brother found her with a knife in bed she had made suicidal statements.  She is followed by [facility] and receives paliperidone injections, last believed to be February 23.  During my interview the patient displays thought blocking and then when she does answer a question causes for a very long period of time but provides no real meaningful information.  She denies all positive symptoms she denies thoughts of harming herself she is oriented to person place situation thinks the month is February and the day is Monday when it is of course Thursday 3/4 at any rate she is displaying a flat affect poverty of content to speech and thought and thought blocking.  She does understand what it means to contract for safety and nods her head that she will do so here.  Apparently there was paranoia leading her to sleep with a knife.  Again, well-known to the service according the assessment team counselor note Ysabel Stankovich an 24 y.o.femalepresenting voluntarily to [facility] for assessment. Patient is accompanied by her brother, [name], who is present for assessment and provides collateral information. Patient is a poor historian. She appears to be thought blocking and struggles to complete assessment. She states today she tried to cut her own throat. She is unable to identify a trigger. She denies HI/AVH, however appears to be responding to internal  stimuli during assessment. She denies any substance use or trauma history.   Per patient's brother, [name]: Patient has a history of psychosis and was recently hospitalized at [facility]. She receives medication management through [place] but he feels she needs more. He states today he found her laying in her bed with a knife next to her stating she wanted to kill herself. Patient lives with their parents, but their mother has MS and has difficulty caring for her. Patient is religiously preoccupied and makes statements that "God is coming." He states her religous preoccupation, along with loneliness, are triggers for her SI.  Patient is alert and oriented x 2. Her speech is soft, eye contact is poor, and she appears to be thought blocking. Her mood is depressed and her affect is flat. She has poor insight, judgement, and impulse control. She appears to be responding to internal stimuli. Principal Problem: <principal problem not specified> Discharge Diagnoses: Active Problems:   Schizophrenia [facility])   Past Psychiatric History: see eval  Past Medical History: History reviewed. No pertinent past medical history. History reviewed. No pertinent surgical history. Family History:  Family History  Problem Relation Age of Onset  . Heart failure Mother   . Hypertension Mother   . Diabetes Father    Family Psychiatric  History: see eval Social History:  Social History   Substance and Sexual Activity  Alcohol Use No     Social History   Substance and Sexual Activity  Drug Use No    Social History   Socioeconomic History  . Marital status: Single  Spouse name: Not on file  . Number of children: Not on file  . Years of education: Not on file  . Highest education level: Not on file  Occupational History  . Not on file  Tobacco Use  . Smoking status: Never Smoker  . Smokeless tobacco: Never Used  Substance and Sexual Activity  . Alcohol use: No  . Drug use: No  . Sexual activity:  Never    Birth control/protection: None  Other Topics Concern  . Not on file  Social History Narrative  . Not on file   Social Determinants of Health   Financial Resource Strain:   . Difficulty of Paying Living Expenses: Not on file  Food Insecurity:   . Worried About Charity fundraiser in the Last Year: Not on file  . Ran Out of Food in the Last Year: Not on file  Transportation Needs:   . Lack of Transportation (Medical): Not on file  . Lack of Transportation (Non-Medical): Not on file  Physical Activity:   . Days of Exercise per Week: Not on file  . Minutes of Exercise per Session: Not on file  Stress:   . Feeling of Stress : Not on file  Social Connections:   . Frequency of Communication with Friends and Family: Not on file  . Frequency of Social Gatherings with Friends and Family: Not on file  . Attends Religious Services: Not on file  . Active Member of Clubs or Organizations: Not on file  . Attends Archivist Meetings: Not on file  . Marital Status: Not on file    Hospital Course:    As discussed this was the latest of multiple admissions for this 24 year old patient she had a level of dangerousness she was so paranoid as to be sleeping with a knife and walking around with a knife at home so we can address her psychosis with adjusting her antipsychotic medications, monitoring for safety and engaging in reality based therapy.  She had no EPS or TD.  She stayed in bed most of this day minimized her symptoms and again was generally withdrawn but displayed no dangerous behaviors here and I believe that she had a genuine improvement in her psychosis at the point of discharge she was alert oriented and cooperative, without thoughts of harming self or others no longer consistent with paranoia.She is followed by [name] and receives paliperidone injections, last believed to be February 23.  Physical Findings: AIMS: Facial and Oral Movements Muscles of Facial Expression:  None, normal Lips and Perioral Area: None, normal Jaw: None, normal Tongue: None, normal,Extremity Movements Upper (arms, wrists, hands, fingers): None, normal Lower (legs, knees, ankles, toes): None, normal, Trunk Movements Neck, shoulders, hips: None, normal, Overall Severity Severity of abnormal movements (highest score from questions above): None, normal Incapacitation due to abnormal movements: None, normal Patient's awareness of abnormal movements (rate only patient's report): No Awareness, Dental Status Current problems with teeth and/or dentures?: No Does patient usually wear dentures?: No  CIWA:  CIWA-Ar Total: 2 COWS:  COWS Total Score: 1 Musculoskeletal: Strength & Muscle Tone: within normal limits Gait & Station: normal Patient leans: N/A  Psychiatric Specialty Exam: Review of Systems  Blood pressure 105/61, pulse 93, temperature (!) 97.5 F (36.4 C), temperature source Oral, resp. rate 20, height 5\' 3"  (1.6 m), weight 115.2 kg, SpO2 100 %.Body mass index is 44.99 kg/m.  General Appearance: Casual  Eye Contact::  Fair  Speech:  Clear and [id number]  Volume:  Normal  Mood:  Euthymic  Affect:  Constricted  Thought Process:  Linear and Descriptions of Associations: Circumstantial  Orientation:  Full (Time, Place, and Person)  Thought Content:  Denies hallucinations  Suicidal Thoughts:  No  Homicidal Thoughts:  No  Memory:  Immediate;   Fair Recent;   Fair Remote;   Fair  Judgement:  Fair  Insight:  Fair  Psychomotor Activity:  Normal  Concentration:  Fair  Recall:  Fair  Fund of Knowledge:Fair  Language: Fair  Akathisia:  Negative  Handed:  Right  AIMS (if indicated):     Assets:  Communication Skills Desire for Improvement  Sleep:  Number of Hours: 7.75  Cognition: WNL  ADL's:  Intact    Have you used any form of tobacco in the last 30 days? (Cigarettes, Smokeless Tobacco, Cigars, and/or Pipes): No  Has this patient used any form of tobacco in the  last 30 days? (Cigarettes, Smokeless Tobacco, Cigars, and/or Pipes) Yes, No  Blood Alcohol level:  Lab Results  Component Value Date   ETH <10 10/23/2019   ETH <10 01/13/2019    Metabolic Disorder Labs:  Lab Results  Component Value Date   HGBA1C 5.3 12/04/2019   MPG 105.41 12/04/2019   MPG 105.41 10/23/2019   Lab Results  Component Value Date   PROLACTIN 57.7 (H) 12/04/2019   PROLACTIN 75.7 (H) 10/23/2019   Lab Results  Component Value Date   CHOL 133 12/04/2019   TRIG 40 12/04/2019   HDL 25 (L) 12/04/2019   CHOLHDL 5.3 12/04/2019   VLDL 8 12/04/2019   LDLCALC 100 (H) 12/04/2019   LDLCALC 102 (H) 10/23/2019    See Psychiatric Specialty Exam and Suicide Risk Assessment completed by Attending Physician prior to discharge.  Discharge destination:  Home  Is patient on multiple antipsychotic therapies at discharge:  No   Has Patient had three or more failed trials of antipsychotic monotherapy by history:  No  Recommended Plan for Multiple Antipsychotic Therapies: NA   Allergies as of 12/09/2019   No Known Allergies     Medication List    TAKE these medications     Indication  benztropine 0.5 MG tablet Commonly known as: COGENTIN Take 1 tablet (0.5 mg total) by mouth 2 (two) times daily.  Indication: Extrapyramidal Reaction caused by Medications   cloNIDine 0.1 MG tablet Commonly known as: CATAPRES Take 1 tablet (0.1 mg total) by mouth 2 (two) times daily.  Indication: High Blood Pressure Disorder   OLANZapine zydis 10 MG disintegrating tablet Commonly known as: ZYPREXA Take 1 tablet (10 mg total) by mouth at bedtime.  Indication: Schizophrenia   traZODone 150 MG tablet Commonly known as: DESYREL Take 1 tablet (150 mg total) by mouth at bedtime as needed for sleep.  Indication: Trouble Sleeping     And paliperidone due the 23rd of the month as per [facility] we believe 234 mg IM Follow-up Information    [facility] Follow up on 12/11/2019.   Why: You are  scheduled for an appointment for medication management and therapy on 12/11/19 at 4:00 pm. You also have an appointment on 12/23/19 at 9:45 am. These will be a virtual tele-health appointments.  Contact information: [address] [place] [address] [phone]           SignedMalvin Johns, MD 12/09/2019, 11:04 AM

## 2019-12-09 NOTE — Plan of Care (Signed)
Pt was able to identify positive coping skills at completion of recreation therapy group sessions.   Dorann Davidson, LRT/CTRS  

## 2019-12-09 NOTE — BHH Suicide Risk Assessment (Signed)
[facility] Discharge Suicide Risk Assessment   Principal Problem: This is the latest of multiple psychiatric admissions and encounters for this 24 year old patient in our healthcare system she is known to have a chronic paranoid schizophrenic condition she re-presented on 3/3 as a walk-in patient.  Her brother found her with a knife in bed she had made suicidal statements.  She is followed by [facility] and receives paliperidone injections, last believed to be February 23. Discharge Diagnoses: Active Problems:   Schizophrenia (HCC)   Total Time spent with patient: 45 minutes  Musculoskeletal: Strength & Muscle Tone: within normal limits Gait & Station: normal Patient leans: N/A  Psychiatric Specialty Exam: Review of Systems  Blood pressure 105/61, pulse 93, temperature (!) 97.5 F (36.4 C), temperature source Oral, resp. rate 20, height 5\' 3"  (1.6 m), weight 115.2 kg, SpO2 100 %.Body mass index is 44.99 kg/m.  General Appearance: Casual  Eye Contact::  Fair  Speech:  Clear and Coherent409  Volume:  Normal  Mood:  Euthymic  Affect:  Constricted  Thought Process:  Linear and Descriptions of Associations: Circumstantial  Orientation:  Full (Time, Place, and Person)  Thought Content:  Denies hallucinations  Suicidal Thoughts:  No  Homicidal Thoughts:  No  Memory:  Immediate;   Fair Recent;   Fair Remote;   Fair  Judgement:  Fair  Insight:  Fair  Psychomotor Activity:  Normal  Concentration:  Fair  Recall:  [id number] of Knowledge:Fair  Language: Fair  Akathisia:  Negative  Handed:  Right  AIMS (if indicated):     Assets:  Communication Skills Desire for Improvement  Sleep:  Number of Hours: 7.75  Cognition: WNL  ADL's:  Intact   Mental Status Per Nursing Assessment::   On Admission:  NA  Demographic Factors:  Unemployed  Loss Factors: Decrease in vocational status  Historical Factors: Impulsivity  Risk Reduction Factors:   Living with another person, especially a  relative and Positive social support  Continued Clinical Symptoms:  Previous Psychiatric Diagnoses and Treatments  Cognitive Features That Contribute To Risk:  Loss of executive function    Suicide Risk:  Minimal: No identifiable suicidal ideation.  Patients presenting with no risk factors but with morbid ruminations; may be classified as minimal risk based on the severity of the depressive symptoms  Follow-up Information    [facility] Follow up on 12/11/2019.   Why: You are scheduled for an appointment for medication management and therapy on 12/11/19 at 4:00 pm. You also have an appointment on 12/23/19 at 9:45 am. These will be a virtual tele-health appointments.  Contact information: [address] [place] [phone]           Plan Of Care/Follow-up recommendations:  Activity:  full  Kealani Leckey, MD 12/09/2019, 7:23 AM

## 2019-12-24 ENCOUNTER — Emergency Department (HOSPITAL_COMMUNITY)
Admission: EM | Admit: 2019-12-24 | Discharge: 2019-12-25 | Disposition: A | Discharge status: Home / Self Care | Payer: Medicaid Other | Attending: Emergency Medicine | Admitting: Emergency Medicine

## 2019-12-24 ENCOUNTER — Encounter (HOSPITAL_COMMUNITY): Payer: Self-pay

## 2019-12-24 ENCOUNTER — Other Ambulatory Visit: Payer: Self-pay

## 2019-12-24 DIAGNOSIS — Z20822 Contact with and (suspected) exposure to covid-19: Secondary | ICD-10-CM | POA: Insufficient documentation

## 2019-12-24 DIAGNOSIS — Z046 Encounter for general psychiatric examination, requested by authority: Secondary | ICD-10-CM | POA: Insufficient documentation

## 2019-12-24 DIAGNOSIS — F209 Schizophrenia, unspecified: Secondary | ICD-10-CM | POA: Insufficient documentation

## 2019-12-24 DIAGNOSIS — F29 Unspecified psychosis not due to a substance or known physiological condition: Secondary | ICD-10-CM | POA: Insufficient documentation

## 2019-12-24 DIAGNOSIS — F259 Schizoaffective disorder, unspecified: Secondary | ICD-10-CM | POA: Diagnosis present

## 2019-12-24 HISTORY — DX: Schizoaffective disorder, unspecified: F25.9

## 2019-12-24 LAB — CBC WITH DIFFERENTIAL/PLATELET
Abs Immature Granulocytes: 0.04 10*3/uL (ref 0.00–0.07)
Basophils Absolute: 0 10*3/uL (ref 0.0–0.1)
Basophils Relative: 0 %
Eosinophils Absolute: 0.1 10*3/uL (ref 0.0–0.5)
Eosinophils Relative: 1 %
HCT: 44 % (ref 36.0–46.0)
Hemoglobin: 14.4 g/dL (ref 12.0–15.0)
Immature Granulocytes: 0 %
Lymphocytes Relative: 13 %
Lymphs Abs: 1.3 10*3/uL (ref 0.7–4.0)
MCH: 30.8 pg (ref 26.0–34.0)
MCHC: 32.7 g/dL (ref 30.0–36.0)
MCV: 94 fL (ref 80.0–100.0)
Monocytes Absolute: 0.7 10*3/uL (ref 0.1–1.0)
Monocytes Relative: 7 %
Neutro Abs: 7.8 10*3/uL — ABNORMAL HIGH (ref 1.7–7.7)
Neutrophils Relative %: 79 %
Platelets: 326 10*3/uL (ref 150–400)
RBC: 4.68 MIL/uL (ref 3.87–5.11)
RDW: 13.2 % (ref 11.5–15.5)
WBC: 9.9 10*3/uL (ref 4.0–10.5)
nRBC: 0 % (ref 0.0–0.2)

## 2019-12-24 LAB — RESPIRATORY PANEL BY RT PCR (FLU A&B, COVID)
Influenza A by PCR: NEGATIVE
Influenza B by PCR: NEGATIVE
SARS Coronavirus 2 by RT PCR: NEGATIVE

## 2019-12-24 LAB — COMPREHENSIVE METABOLIC PANEL
ALT: 23 U/L (ref 0–44)
AST: 31 U/L (ref 15–41)
Albumin: 4.5 g/dL (ref 3.5–5.0)
Alkaline Phosphatase: 53 U/L (ref 38–126)
Anion gap: 14 (ref 5–15)
BUN: 16 mg/dL (ref 6–20)
CO2: 21 mmol/L — ABNORMAL LOW (ref 22–32)
Calcium: 9.8 mg/dL (ref 8.9–10.3)
Chloride: 101 mmol/L (ref 98–111)
Creatinine, Ser: 1.2 mg/dL — ABNORMAL HIGH (ref 0.44–1.00)
GFR calc Af Amer: >60 mL/min (ref >60)
GFR calc non Af Amer: >60 mL/min (ref >60)
Glucose, Bld: 97 mg/dL (ref 70–99)
Potassium: 4.4 mmol/L (ref 3.5–5.1)
Sodium: 136 mmol/L (ref 135–145)
Total Bilirubin: 0.8 mg/dL (ref 0.3–1.2)
Total Protein: 10.4 g/dL — ABNORMAL HIGH (ref 6.5–8.1)

## 2019-12-24 LAB — ETHANOL: Alcohol, Ethyl (B): <10 mg/dL (ref <10)

## 2019-12-24 LAB — SALICYLATE LEVEL: Salicylate Lvl: <7 mg/dL — ABNORMAL LOW (ref 7.0–30.0)

## 2019-12-24 LAB — I-STAT BETA HCG BLOOD, ED (MC, WL, AP ONLY): I-stat hCG, quantitative: <5 m[IU]/mL (ref <5)

## 2019-12-24 LAB — ACETAMINOPHEN LEVEL: Acetaminophen (Tylenol), Serum: <10 ug/mL — ABNORMAL LOW (ref 10–30)

## 2019-12-24 MED ORDER — TRAZODONE HCL 50 MG PO TABS
150.0000 mg | ORAL_TABLET | Freq: Every day | ORAL | Status: DC
  Administered 2019-12-24: 150 mg via ORAL
  Filled 2019-12-24: qty 1

## 2019-12-24 MED ORDER — ONDANSETRON HCL 4 MG PO TABS: 4.0000 mg | ORAL_TABLET | Freq: Three times a day (TID) | ORAL | Status: DC | PRN

## 2019-12-24 MED ORDER — CLONIDINE HCL 0.1 MG PO TABS
0.1000 mg | ORAL_TABLET | Freq: Two times a day (BID) | ORAL | Status: DC
  Administered 2019-12-24 – 2019-12-25 (×2): 0.1 mg via ORAL
  Filled 2019-12-24 (×2): qty 1

## 2019-12-24 MED ORDER — OLANZAPINE 10 MG PO TABS
10.0000 mg | ORAL_TABLET | Freq: Every day | ORAL | Status: DC
  Administered 2019-12-24: 22:00:00 10 mg via ORAL
  Filled 2019-12-24: qty 1

## 2019-12-24 MED ORDER — ZIPRASIDONE MESYLATE 20 MG IM SOLR
20.0000 mg | Freq: Once | INTRAMUSCULAR | Status: AC | PRN
  Administered 2019-12-24: 20 mg via INTRAMUSCULAR
  Filled 2019-12-24: qty 20

## 2019-12-24 MED ORDER — BENZTROPINE MESYLATE 0.5 MG PO TABS
0.5000 mg | ORAL_TABLET | Freq: Two times a day (BID) | ORAL | Status: DC
  Administered 2019-12-24 – 2019-12-25 (×2): 0.5 mg via ORAL
  Filled 2019-12-24 (×2): qty 1

## 2019-12-24 MED ORDER — ZIPRASIDONE MESYLATE 20 MG IM SOLR: 10.0000 mg | Freq: Once | INTRAMUSCULAR | Status: DC

## 2019-12-24 NOTE — Progress Notes (Signed)
Received Lindsey Richmond this PM in her room with the sitter at the bedside. Later she attempted to leave the unit x2. When she is in her room she is chanting inaudible words. She was given PO medications  with assistance related to difficulty handling the pills and drinking the water. She attempted to leave an additional x 2 throughout the night. She remains disorganized this AM.

## 2019-12-24 NOTE — Progress Notes (Signed)
Patient meets inpatient criteria per Assunta Found, NP. Pt is currently IVC. Patient has been faxed out to the following facilities for review:   [facility]  [facility]  [facility]  CSW will continue to follow and assist with finding bed placement.   Drucilla Schmidt, MSW, LCSW-A Clinical Disposition Social Worker [facility]/TTS [phone]

## 2019-12-24 NOTE — ED Notes (Signed)
Report given to [facility].

## 2019-12-24 NOTE — ED Provider Notes (Signed)
[facility]-EMERGENCY DEPT Provider Note   CSN: [id number] Arrival date & time: 12/24/19  1118     History Chief Complaint  Patient presents with  . Hallucinations  . IVC    Lindsey Richmond is a 24 y.o. female history of schizophrenia presented to emergency department with concerns for hallucinations.  Police report they were called out to the house by the patient's mother reports the patient's been in her room, refusing to eat and muttering to herself.  They report the patient was not combative but was "dead weight" and refused to cooperate with them.  She did not resist.  She was brought into the emergency department in the police cruiser, not under arrest, not in handcuffs.   On my exam she is lying on the bed, eyes closed, repeating "I repent, I repent, I love you, I love you, I hate you," and will not answer any of my other questions.  Her mother provides additional history.  She reports the patient was just discharged from behavioral health [facility] about 2 weeks ago.  The patient has been going weekly to the [facility] for injections for her schizophrenia.  She went yesterday for injection as scheduled.  However in the evening the patient became withdrawn and argumentative with her mother.  Mother reports the patient was refusing to take her medications.  Patient has not been eating.  Patient is muttering to herself at home, not responding as per usual.  At baseline, when she is properly medicated, mother states that the patient is fairly independent, goes on walks, is conversational and oriented.  Her mother believes that she was having another acute psychotic episode.  Unfortunately this is a recurring issue for this patient  HPI     Past Medical History:  Diagnosis Date  . Schizo affective schizophrenia (HCC)     There are no problems to display for this patient.   OB History   No obstetric history on file.     No family history on file.  Social History     Tobacco Use  . Smoking status: Not on file  Substance Use Topics  . Alcohol use: Never  . Drug use: Never    Home Medications Prior to Admission medications   Not on File    Allergies    Patient has no allergy information on record.  Review of Systems   Review of Systems  Unable to perform ROS: Psychiatric disorder (level 5 caveat)    Physical Exam Updated Vital Signs BP 129/67   Pulse (!) 102   Temp 98.7 F (37.1 C) (Axillary)   Resp 20   SpO2 100%   Physical Exam Vitals and nursing note reviewed.  Constitutional:      Appearance: She is well-developed. She is obese.     Comments: Eyes closed, muttering  HENT:     Head: Normocephalic and atraumatic.  Cardiovascular:     Rate and Rhythm: Normal rate and regular rhythm.     Pulses: Normal pulses.  Pulmonary:     Effort: Pulmonary effort is normal. No respiratory distress.     Breath sounds: Normal breath sounds.  Abdominal:     Palpations: Abdomen is soft.     Tenderness: There is no abdominal tenderness.  Musculoskeletal:     Cervical back: Neck supple.  Skin:    General: Skin is warm and dry.  Neurological:     Mental Status: She is alert.  Psychiatric:     Comments: Does not  engage in conversation or respond to her name Repeating "I repent, I love you, I hate you" in variations     ED Results / Procedures / Treatments   Labs (all labs ordered are listed, but only abnormal results are displayed) Labs Reviewed  ACETAMINOPHEN LEVEL - Abnormal; Notable for the following components:      Result Value   Acetaminophen (Tylenol), Serum <10 (*)    All other components within normal limits  COMPREHENSIVE METABOLIC PANEL - Abnormal; Notable for the following components:   CO2 21 (*)    Creatinine, Ser 1.20 (*)    Total Protein 10.4 (*)    All other components within normal limits  CBC WITH DIFFERENTIAL/PLATELET - Abnormal; Notable for the following components:   Neutro Abs 7.8 (*)    All other  components within normal limits  SALICYLATE LEVEL - Abnormal; Notable for the following components:   Salicylate Lvl <6.7 (*)    All other components within normal limits  RESPIRATORY PANEL BY RT PCR (FLU A&B, COVID)  ETHANOL  RAPID URINE DRUG SCREEN, HOSP PERFORMED  URINALYSIS, ROUTINE W REFLEX MICROSCOPIC  I-STAT BETA HCG BLOOD, ED ([facility], [facility], [facility] ONLY)    EKG EKG Interpretation  Date/Time:  Wednesday December 24 2019 11:24:37 EDT Ventricular Rate:  110 PR Interval:    QRS Duration: 82 QT Interval:  341 QTC Calculation: 462 R Axis:   34 Text Interpretation: Sinus tachycardia Consider right atrial enlargement No STEMI Confirmed by Octaviano Glow [phone]) on 12/24/2019 11:34:08 AM   Radiology No results found.  Procedures Procedures (including critical care time)  Medications Ordered in ED Medications  ondansetron (ZOFRAN) tablet 4 mg (has no administration in time range)  ziprasidone (GEODON) injection 20 mg (has no administration in time range)    ED Course  I have reviewed the triage vital signs and the nursing notes.  Pertinent labs & imaging results that were available during my care of the patient were reviewed by me and considered in my medical decision making (see chart for details).  24 year old female with a history of schizophrenia and recurrent hospitalizations for the same issue, who is intermittently controlled on outpatient therapy, presenting to the ED with concern for another schizophrenic episode.  On exam the patient does display evidence of psychosis, muttering same phrases over and over, it is unclear if she is responding to internal stimuli.  She would not engage in conversation.  Her mother says is an acute change from yesterday.  Said the patient has not taken her medications at home.  Unfortunately we will need to fill out another IVC as the patient clearly is acutely psychotic, cannot engage with others, cannot take her medications.  There have been no  explicit suicidal gestures at home or here.  However mother says in the past the patient has made suicidal gestures including cutting herself.  Stable on arrival, no evidence of head trauma Will perform medical evaluation, then anticipate [facility] for TTS evaluation  Patient not needing restraints at the moment, not violent towards other  Clinical Course as of Dec 24 1307  Wed Dec 24, 2019  1246 Labs are unremarkable.  I believe she is medically stable for psychiatric evaluation.   [MT]    Clinical Course User Index [MT] Helvi Royals, Carola Rhine, MD    Final Clinical Impression(s) / ED Diagnoses Final diagnoses:  Psychosis, unspecified psychosis type [facility])    Rx / DC Orders ED Discharge Orders    None  Terald Sleeper, MD 12/24/19 1310

## 2019-12-24 NOTE — ED Notes (Signed)
Pt arrived calmly.  She is sitting on bed chanting.  She is obviously having AVH.  Sitter is with patient.

## 2019-12-24 NOTE — Progress Notes (Signed)
CSW received a phone call from [facility]. They are at capacity.   Drucilla Schmidt, MSW, LCSW-A Clinical Disposition Social Worker [facility]/TTS [phone]

## 2019-12-24 NOTE — ED Notes (Signed)
Pt continued to try to run.  She is difficult to stop.  MD notified for restraint orders.

## 2019-12-24 NOTE — ED Triage Notes (Signed)
Pt BIB [facility]. [facility] reports mother called out due to pt having hallucinations. Pt unable to answer any questions at this time and continues to repeat "I repent". Pts mother requesting pt be IVC'd. Pt has not been combative. Pt is calm at this time.
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
## 2019-12-24 NOTE — ED Notes (Signed)
Pt attempted to leave unit and took three sitters to stop her .  Security was called.  Prn order was obtained and given.

## 2019-12-25 ENCOUNTER — Encounter (HOSPITAL_COMMUNITY): Payer: Self-pay | Admitting: Registered Nurse

## 2019-12-25 ENCOUNTER — Encounter (HOSPITAL_COMMUNITY): Payer: Self-pay | Admitting: Psychiatry

## 2019-12-25 ENCOUNTER — Inpatient Hospital Stay (HOSPITAL_COMMUNITY)
Admission: AD | Admit: 2019-12-25 | Discharge: 2020-01-02 | DRG: 885 | Disposition: A | Discharge status: Home / Self Care | Payer: Medicaid Other | Source: Intra-hospital | Attending: Emergency Medicine | Admitting: Emergency Medicine

## 2019-12-25 ENCOUNTER — Other Ambulatory Visit: Payer: Self-pay

## 2019-12-25 DIAGNOSIS — Z560 Unemployment, unspecified: Secondary | ICD-10-CM

## 2019-12-25 DIAGNOSIS — F200 Paranoid schizophrenia: Secondary | ICD-10-CM | POA: Diagnosis present

## 2019-12-25 DIAGNOSIS — Z6841 Body Mass Index (BMI) 40.0 and over, adult: Secondary | ICD-10-CM

## 2019-12-25 DIAGNOSIS — Z9114 Patient's other noncompliance with medication regimen: Secondary | ICD-10-CM | POA: Diagnosis not present

## 2019-12-25 DIAGNOSIS — W19XXXA Unspecified fall, initial encounter: Secondary | ICD-10-CM | POA: Diagnosis present

## 2019-12-25 DIAGNOSIS — F209 Schizophrenia, unspecified: Secondary | ICD-10-CM | POA: Diagnosis not present

## 2019-12-25 DIAGNOSIS — Z79899 Other long term (current) drug therapy: Secondary | ICD-10-CM | POA: Diagnosis not present

## 2019-12-25 DIAGNOSIS — Z20822 Contact with and (suspected) exposure to covid-19: Secondary | ICD-10-CM | POA: Diagnosis present

## 2019-12-25 DIAGNOSIS — F940 Selective mutism: Secondary | ICD-10-CM | POA: Diagnosis present

## 2019-12-25 DIAGNOSIS — F259 Schizoaffective disorder, unspecified: Secondary | ICD-10-CM | POA: Diagnosis present

## 2019-12-25 MED ORDER — BENZTROPINE MESYLATE 0.5 MG PO TABS
0.5000 mg | ORAL_TABLET | Freq: Two times a day (BID) | ORAL | Status: DC
  Administered 2019-12-25: 0.5 mg via ORAL
  Filled 2019-12-25 (×7): qty 1

## 2019-12-25 MED ORDER — OLANZAPINE 10 MG PO TABS
10.0000 mg | ORAL_TABLET | Freq: Every day | ORAL | Status: DC
  Administered 2019-12-25 – 2019-12-27 (×2): 10 mg via ORAL
  Filled 2019-12-25 (×6): qty 1

## 2019-12-25 MED ORDER — TRAZODONE HCL 150 MG PO TABS
150.0000 mg | ORAL_TABLET | Freq: Every day | ORAL | Status: DC
  Administered 2019-12-25: 150 mg via ORAL
  Filled 2019-12-25 (×4): qty 1

## 2019-12-25 MED ORDER — CLONIDINE HCL 0.1 MG PO TABS
0.1000 mg | ORAL_TABLET | Freq: Two times a day (BID) | ORAL | Status: DC
  Administered 2019-12-25 – 2019-12-28 (×2): 0.1 mg via ORAL
  Filled 2019-12-25 (×14): qty 1

## 2019-12-25 MED ORDER — ONDANSETRON HCL 4 MG PO TABS: 4.0000 mg | ORAL_TABLET | Freq: Three times a day (TID) | ORAL | Status: DC | PRN

## 2019-12-25 NOTE — Consult Note (Signed)
  Chart reviewed and consulted with Dr. Jola Babinski.  Attempted to see Lindsey Richmond, 24 y.o., female patient via tele psych on 12/25/19.  During evaluation Lindsey Richmond is alert/oriented to self only,; she is not aware of where she is or why she was brought there.  Patient was able to respond yes when asked about hallucinations.   Patient does appear to be responding to internal/external stimuli.  Unable to complete assessment related to acuity of condition.     Will restart her home medications.    Current Outpatient Medications  Medication Instructions  . benztropine (COGENTIN) 0.5 mg, Oral, 2 times daily  . cloNIDine (CATAPRES) 0.1 mg, Oral, 2 times daily  . Invega Sustenna 156 mg, Intramuscular, Every 30 days  . OLANZapine (ZYPREXA) 10 mg, Oral, Daily at bedtime  . traZODone (DESYREL) 150 mg, Oral, Daily at bedtime     Disposition: Recommend psychiatric Inpatient admission when medically cleared.

## 2019-12-25 NOTE — ED Notes (Signed)
Pt off unit to [facility] per provider. Pt calm, cooperative, confused, drowsy, no s/s of distress. DC information given to [employer] for facility. Belongings given to [employer] for facility. Pt ambulatory off unit to, escorted and transported by [employer].

## 2019-12-25 NOTE — Progress Notes (Signed)
Psychoeducational Group Note  Date:  12/25/2019 Time:  2030  Group Topic/Focus:  Wrap-Up Group:   The focus of this group is to help patients review their daily goal of treatment and discuss progress on daily workbooks.  Participation Level: Did Not Attend  Participation Quality:  Not Applicable  Affect:  Not Applicable  Cognitive:  Not Applicable  Insight:  Not Applicable  Engagement in Group: Not Applicable  Additional Comments:  The patient did not attend group this evening.   Robertlee Rogacki S 12/25/2019, 8:30 PM

## 2019-12-25 NOTE — ED Notes (Signed)
No restraints on. Pt resting comfortably in bed.

## 2019-12-25 NOTE — Progress Notes (Signed)
   12/25/19 2348  Psych Admission Type (Psych Patients Only)  Admission Status Involuntary  Psychosocial Assessment  Patient Complaints Suspiciousness  Eye Contact Avertive;Brief  Facial Expression Blank;Flat;Pensive  Affect Blunted;Preoccupied;Flat  Speech Soft;Slow;Slurred  Interaction Avoidant;Forwards little;Guarded;Minimal  Motor Activity Slow;Unsteady  Appearance/Hygiene In scrubs  Behavior Characteristics Guarded  Mood Preoccupied;Anxious  Thought Process  Coherency Disorganized  Content Preoccupation  Delusions None reported or observed  Perception Hallucinations  Hallucination Auditory  Judgment Poor  Confusion Moderate  Danger to Self  Current suicidal ideation? Denies  Danger to Others  Danger to Others None reported or observed  D: Patient in her room most of the evening. Pt responded to all of writer"s questions. Pt preoccupied and appears to responding to be internal stimuli. Medication administered as prescribed. Support and encouragement provided as needed.  R: Patient remains safe on the unit. Will continue to monitor for safety and stability.

## 2019-12-25 NOTE — Discharge Summary (Signed)
  Patient is to be transferred to [facility] for inpatient psychiatric treatment 

## 2019-12-25 NOTE — BH Assessment (Signed)
[facility] Assessment Progress Note  Per Alvy Beal, FNP, this pt requires psychiatric hospitalization.  Jasmine has assigned pt to [facility] Rm 502-1; [facility] would like to receive pt as soon as possible.  Pt presents under IVC initiated by EDP Alvester Chou, MD, and IVC documents have been faxed to [facility].  Pt's nurse, Waynetta Sandy, has been notified, and agrees to call report to [phone].  Pt is to be transported via [employer].   Doylene Canning, [place] Behavioral Health Coordinator [phone]

## 2019-12-25 NOTE — Tx Team (Signed)
Initial Treatment Plan 12/25/2019 5:32 PM [name] OMA:[id number]    PATIENT STRESSORS: Marital or family conflict Medication change or noncompliance   PATIENT STRENGTHS: Physical Health Supportive family/friends   PATIENT IDENTIFIED PROBLEMS: Medication noncompliance  Internal stimuli  aggression  anxiety               DISCHARGE CRITERIA:  Ability to meet basic life and health needs Improved stabilization in mood, thinking, and/or behavior Motivation to continue treatment in a less acute level of care Need for constant or close observation no longer present  PRELIMINARY DISCHARGE PLAN: Attend aftercare/continuing care group Outpatient therapy Participate in family therapy Return to previous living arrangement  PATIENT/FAMILY INVOLVEMENT: This treatment plan has been presented to and reviewed with the patient, Lindsey Richmond.  The patient and family have been given the opportunity to ask questions and make suggestions.  [name], RN 12/25/2019, 5:32 PM
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
## 2019-12-26 DIAGNOSIS — F200 Paranoid schizophrenia: Secondary | ICD-10-CM | POA: Diagnosis not present

## 2019-12-26 MED ORDER — LORAZEPAM 1 MG PO TABS
1.0000 mg | ORAL_TABLET | Freq: Three times a day (TID) | ORAL | Status: DC
  Filled 2019-12-26: qty 1

## 2019-12-26 MED ORDER — HALOPERIDOL LACTATE 5 MG/ML IJ SOLN
5.0000 mg | Freq: Two times a day (BID) | INTRAMUSCULAR | Status: DC
  Administered 2019-12-26 – 2019-12-28 (×2): 5 mg via INTRAMUSCULAR
  Filled 2019-12-26 (×9): qty 1

## 2019-12-26 MED ORDER — BENZTROPINE MESYLATE 0.5 MG PO TABS: 0.5000 mg | ORAL_TABLET | Freq: Two times a day (BID) | ORAL | Status: DC | PRN

## 2019-12-26 MED ORDER — LORAZEPAM 1 MG PO TABS
1.0000 mg | ORAL_TABLET | ORAL | Status: DC | PRN
  Filled 2019-12-26: qty 1

## 2019-12-26 MED ORDER — OLANZAPINE 5 MG PO TBDP
5.0000 mg | ORAL_TABLET | Freq: Three times a day (TID) | ORAL | Status: DC | PRN
  Filled 2019-12-26: qty 1

## 2019-12-26 MED ORDER — HALOPERIDOL 5 MG PO TABS
5.0000 mg | ORAL_TABLET | Freq: Two times a day (BID) | ORAL | Status: DC
  Filled 2019-12-26 (×8): qty 1

## 2019-12-26 MED ORDER — LORAZEPAM 2 MG/ML IJ SOLN
2.0000 mg | Freq: Once | INTRAMUSCULAR | Status: AC
  Administered 2019-12-26: 2 mg via INTRAVENOUS
  Filled 2019-12-26: qty 1
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
## 2019-12-26 NOTE — Tx Team (Signed)
Interdisciplinary Treatment and Diagnostic Plan Update  12/26/2019 Time of Session: 900am Samirah Watkinson MRN: [id number]  Principal Diagnosis: <principal problem not specified>  Secondary Diagnoses: Active Problems:   Schizophrenia (HCC)   Current Medications:  Current Facility-Administered Medications  Medication Dose Route Frequency Provider Last Rate Last Admin  . benztropine (COGENTIN) tablet 0.5 mg  0.5 mg Oral BID Rankin, Shuvon B, NP   0.5 mg at 12/25/19 1928  . cloNIDine (CATAPRES) tablet 0.1 mg  0.1 mg Oral BID Rankin, Shuvon B, NP   0.1 mg at 12/25/19 1928  . OLANZapine (ZYPREXA) tablet 10 mg  10 mg Oral QHS Rankin, Shuvon B, NP   10 mg at 12/25/19 2054  . ondansetron (ZOFRAN) tablet 4 mg  4 mg Oral Q8H PRN Rankin, Shuvon B, NP      . traZODone (DESYREL) tablet 150 mg  150 mg Oral QHS Rankin, Shuvon B, NP   150 mg at 12/25/19 2054   PTA Medications: Medications Prior to Admission  Medication Sig Dispense Refill Last Dose  . benztropine (COGENTIN) 0.5 MG tablet Take 0.5 mg by mouth 2 (two) times daily.     . cloNIDine (CATAPRES) 0.1 MG tablet Take 0.1 mg by mouth 2 (two) times daily.     . OLANZapine (ZYPREXA) 10 MG tablet Take 10 mg by mouth at bedtime.     . paliperidone (INVEGA SUSTENNA) 156 MG/ML SUSY injection Inject 156 mg into the muscle every 30 (thirty) days.     . traZODone (DESYREL) 150 MG tablet Take 150 mg by mouth at bedtime.       Patient Stressors: Marital or family conflict Medication change or noncompliance  Patient Strengths: Physical Health Supportive family/friends  Treatment Modalities: Medication Management, Group therapy, Case management,  1 to 1 session with clinician, Psychoeducation, Recreational therapy.   Physician Treatment Plan for Primary Diagnosis: <principal problem not specified> Long Term Goal(s):     Short Term Goals:    Medication Management: Evaluate patient's response, side effects, and tolerance of medication  regimen.  Therapeutic Interventions: 1 to 1 sessions, Unit Group sessions and Medication administration.  Evaluation of Outcomes: Not Met  Physician Treatment Plan for Secondary Diagnosis: Active Problems:   Schizophrenia (HCC)  Long Term Goal(s):     Short Term Goals:       Medication Management: Evaluate patient's response, side effects, and tolerance of medication regimen.  Therapeutic Interventions: 1 to 1 sessions, Unit Group sessions and Medication administration.  Evaluation of Outcomes: Not Met   RN Treatment Plan for Primary Diagnosis: <principal problem not specified> Long Term Goal(s): Knowledge of disease and therapeutic regimen to maintain health will improve  Short Term Goals: Ability to verbalize frustration and anger appropriately will improve, Ability to demonstrate self-control, Ability to participate in decision making will improve, Ability to verbalize feelings will improve and Compliance with prescribed medications will improve  Medication Management: RN will administer medications as ordered by provider, will assess and evaluate patient's response and provide education to patient for prescribed medication. RN will report any adverse and/or side effects to prescribing provider.  Therapeutic Interventions: 1 on 1 counseling sessions, Psychoeducation, Medication administration, Evaluate responses to treatment, Monitor vital signs and CBGs as ordered, Perform/monitor CIWA, COWS, AIMS and Fall Risk screenings as ordered, Perform wound care treatments as ordered.  Evaluation of Outcomes: Not Met   LCSW Treatment Plan for Primary Diagnosis: <principal problem not specified> Long Term Goal(s): Safe transition to appropriate next level of care at discharge, Engage patient   in therapeutic group addressing interpersonal concerns.  Short Term Goals: Engage patient in aftercare planning with referrals and resources, Increase social support, Increase ability to appropriately  verbalize feelings, Facilitate acceptance of mental health diagnosis and concerns, Identify triggers associated with mental health/substance abuse issues and Increase skills for wellness and recovery  Therapeutic Interventions: Assess for all discharge needs, 1 to 1 time with Social worker, Explore available resources and support systems, Assess for adequacy in community support network, Educate family and significant other(s) on suicide prevention, Complete Psychosocial Assessment, Interpersonal group therapy.  Evaluation of Outcomes: Not Met   Progress in Treatment: Attending groups: No. Participating in groups: No. Taking medication as prescribed: No. Toleration medication: Yes. Family/Significant other contact made: No, will contact:  when consent given Patient understands diagnosis: No. Discussing patient identified problems/goals with staff: No. Medical problems stabilized or resolved: Yes. Denies suicidal/homicidal ideation: No. Issues/concerns per patient self-inventory: No. Other: N/A  New problem(s) identified: No, Describe:  none  New Short Term/Long Term Goal(s): Detox, elimination of AVH/symptoms of psychosis, medication management for mood stabilization; elimination of SI thoughts; development of comprehensive mental wellness/sobriety plan.   Patient Goals:  Patient was nonverbal and did not provide a goal  Discharge Plan or Barriers: SPE pamphlet, Mobile Crisis information, and AA/NA information provided to patient for additional community support and resources. CSW assessing for appropriate referrals.  Reason for Continuation of Hospitalization: Medication stabilization  Estimated Length of Stay: 3-5 days  Attendees: Patient: Lindsey Richmond 12/26/2019 10:03 AM  Physician: Dr Cobos MD 12/26/2019 10:03 AM  Nursing:  12/26/2019 10:03 AM  RN Care Manager: 12/26/2019 10:03 AM  Social Worker:   LCSW 12/26/2019 10:03 AM  Recreational Therapist:  12/26/2019 10:03 AM   Other:  12/26/2019 10:03 AM  Other:  12/26/2019 10:03 AM  Other: 12/26/2019 10:03 AM    Scribe for Treatment Team:  K , LCSW 12/26/2019 10:03 AM 

## 2019-12-26 NOTE — [facility] Counselor (Signed)
CSW attempted to complete PSA with pt, but pt nonverbal and staring up above her while laying in the bed. PSA will be attempted at later date.   Iris Pert, MSW, LCSW Clinical Social Work 12/26/2019 10:02 AM

## 2019-12-26 NOTE — Progress Notes (Signed)
   12/26/19 1400  Psych Admission Type (Psych Patients Only)  Admission Status Involuntary  Psychosocial Assessment  Patient Complaints Suspiciousness  Eye Contact Avertive  Facial Expression Blank  Affect Appropriate to circumstance  Speech Soft;Slow (pressured when hallucinating)  Interaction Avoidant;Guarded  Motor Activity Slow;Unsteady  Appearance/Hygiene In scrubs  Behavior Characteristics Agitated;Anxious;Guarded;Impulsive  Mood Anxious;Suspicious;Preoccupied  Thought Process  Coherency Disorganized  Content Preoccupation  Delusions None reported or observed  Perception Hallucinations  Hallucination Auditory  Judgment Poor  Confusion Moderate  Danger to Self  Current suicidal ideation? Denies  Danger to Others  Danger to Others None reported or observed

## 2019-12-26 NOTE — Progress Notes (Signed)
   12/26/19 2200  Psych Admission Type (Psych Patients Only)  Admission Status Involuntary  Psychosocial Assessment  Patient Complaints Suspiciousness  Eye Contact Avertive  Facial Expression Blank  Affect Appropriate to circumstance  Speech Soft;Slow (pressured when hallucinating)  Interaction Avoidant;Guarded  Motor Activity Slow;Unsteady  Appearance/Hygiene In scrubs  Behavior Characteristics Anxious;Agitated  Mood Anxious;Labile;Suspicious  Thought Process  Coherency Disorganized  Content Preoccupation  Delusions None reported or observed  Perception Hallucinations  Hallucination Auditory  Judgment Poor  Confusion Moderate  Danger to Self  Current suicidal ideation? Denies  Danger to Others  Danger to Others None reported or observed

## 2019-12-26 NOTE — Progress Notes (Signed)
Adult Psychoeducational Group Note  Date:  12/26/2019 Time:  10:58 PM  Group Topic/Focus:  Wrap-Up Group:   The focus of this group is to help patients review their daily goal of treatment and discuss progress on daily workbooks.  Participation Level:  Did Not Attend  Participation Quality:  Did Not Attend  Affect:  Did Not Attend  Cognitive:  Did Not Attend  Insight: None  Engagement in Group:  Did Not Attend  Modes of Intervention:  Did Not Attend  Additional Comments:  Pt did not attend evening wrap up group tonight.  Felipa Furnace 12/26/2019, 10:58 PM

## 2019-12-26 NOTE — [facility] Group Notes (Signed)
LCSW Group Therapy Note  12/26/2019 10:29 AM  Type of Therapy and Topic:  Group Therapy:  Feelings around Relapse and Recovery  Participation Level:  Did Not Attend   Description of Group:    Patients in this group will discuss emotions they experience before and after a relapse. They will process how experiencing these feelings, or avoidance of experiencing them, relates to having a relapse. Facilitator will guide patients to explore emotions they have related to recovery. Patients will be encouraged to process which emotions are more powerful. They will be guided to discuss the emotional reaction significant others in their lives may have to their relapse or recovery. Patients will be assisted in exploring ways to respond to the emotions of others without this contributing to a relapse.  Therapeutic Goals: 1. Patient will identify two or more emotions that lead to a relapse for them 2. Patient will identify two emotions that result when they relapse 3. Patient will identify two emotions related to recovery 4. Patient will demonstrate ability to communicate their needs through discussion and/or role plays   Summary of Patient Progress: x    Therapeutic Modalities:   Cognitive Behavioral Therapy Solution-Focused Therapy Assertiveness Training Relapse Prevention Therapy   Glorine Hanratty, MSW, LCSW Clinical Social Work 12/26/2019 10:29 AM   
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
## 2019-12-27 ENCOUNTER — Inpatient Hospital Stay (HOSPITAL_COMMUNITY): Payer: Medicaid Other

## 2019-12-27 ENCOUNTER — Other Ambulatory Visit: Payer: Self-pay

## 2019-12-27 DIAGNOSIS — F200 Paranoid schizophrenia: Secondary | ICD-10-CM | POA: Diagnosis not present

## 2019-12-27 LAB — TSH: TSH: 3.09 u[IU]/mL (ref 0.350–4.500)

## 2019-12-27 LAB — LIPID PANEL
Cholesterol: 145 mg/dL (ref 0–200)
HDL: 24 mg/dL — ABNORMAL LOW (ref >40)
LDL Cholesterol: 109 mg/dL — ABNORMAL HIGH (ref 0–99)
Total CHOL/HDL Ratio: 6 RATIO
Triglycerides: 59 mg/dL (ref <150)
VLDL: 12 mg/dL (ref 0–40)

## 2019-12-27 LAB — BASIC METABOLIC PANEL
Anion gap: 12 (ref 5–15)
BUN: 22 mg/dL — ABNORMAL HIGH (ref 6–20)
CO2: 24 mmol/L (ref 22–32)
Calcium: 9.3 mg/dL (ref 8.9–10.3)
Chloride: 103 mmol/L (ref 98–111)
Creatinine, Ser: 0.99 mg/dL (ref 0.44–1.00)
GFR calc Af Amer: >60 mL/min (ref >60)
GFR calc non Af Amer: >60 mL/min (ref >60)
Glucose, Bld: 90 mg/dL (ref 70–99)
Potassium: 3.7 mmol/L (ref 3.5–5.1)
Sodium: 139 mmol/L (ref 135–145)

## 2019-12-27 LAB — HEMOGLOBIN A1C
Hgb A1c MFr Bld: 5.6 % (ref 4.8–5.6)
Mean Plasma Glucose: 114.02 mg/dL

## 2019-12-27 MED ORDER — LORAZEPAM 2 MG/ML IJ SOLN
1.0000 mg | Freq: Once | INTRAMUSCULAR | Status: AC
  Administered 2019-12-27: 1 mg via INTRAMUSCULAR
  Filled 2019-12-27: qty 1

## 2019-12-27 MED ORDER — LORAZEPAM 1 MG PO TABS: 1.0000 mg | ORAL_TABLET | Freq: Once | ORAL | Status: DC

## 2019-12-27 MED ORDER — HALOPERIDOL LACTATE 5 MG/ML IJ SOLN
5.0000 mg | Freq: Once | INTRAMUSCULAR | Status: AC
  Administered 2019-12-27: 5 mg via INTRAMUSCULAR
  Filled 2019-12-27: qty 1

## 2019-12-27 NOTE — ED Notes (Signed)
Safe transport called for pt transfer to [facility] 

## 2019-12-27 NOTE — Progress Notes (Signed)
   12/27/19 0900  Psych Admission Type (Psych Patients Only)  Admission Status Involuntary  Psychosocial Assessment  Patient Complaints Isolation;Confusion;Disorientation  Eye Contact Avertive  Facial Expression Blank  Affect Appropriate to circumstance  Speech Soft;Slow  Interaction Isolative;Guarded;Forwards little;Minimal  Motor Activity Slow;Unsteady  Appearance/Hygiene Disheveled;In hospital gown  Behavior Characteristics Unwilling to participate;Irritable  Mood Labile;Suspicious;Preoccupied  Thought Process  Coherency Disorganized;Blocking  Content Preoccupation  Delusions None reported or observed  Perception Hallucinations  Hallucination Auditory  Judgment Poor  Confusion Moderate  Danger to Self  Current suicidal ideation? Denies  Danger to Others  Danger to Others None reported or observed

## 2019-12-27 NOTE — ED Notes (Signed)
Pt transported to radiology.

## 2019-12-27 NOTE — ED Triage Notes (Signed)
24 yo pt BIB [facility] from [facility]. Pt was given 5mg  haldol 1mg  ativan cocktail at 11am. Ot had an unwitnessed fall and was found on her back. Pt is complaining of pain in her head right side. Pt has history of combative behavior. Pt denies SOB, CP, anticoagulant therapy per EMS.  Vitals: bp 130/108 cbg 122 Hr 110 spo2 100 % on room air

## 2019-12-27 NOTE — ED Notes (Signed)
PTAR called for pt transfer to [facility]
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
## 2019-12-27 NOTE — ED Notes (Signed)
Called report to receiving nurse at  [facility] and she states pt needs to return by EMS. Safe transport was called and canceled
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
## 2019-12-27 NOTE — Progress Notes (Signed)
Pt refused to take morning medications. Support, encouragement and education was provided.

## 2019-12-27 NOTE — Discharge Instructions (Addendum)
You were evaluated in the Emergency Department and after careful evaluation, we did not find any emergent condition requiring admission or further testing in the hospital.  Your exam/testing today is overall reassuring.  Please return to the Emergency Department if you experience any worsening of your condition.  We encourage you to follow up with a primary care provider.  Thank you for allowing us to be a part of your care. 

## 2019-12-27 NOTE — BHH Group Notes (Signed)
  B[facility] LCSW Group Therapy Note  Date/Time:  12/27/2019 11:15AM-12:00PM  Type of Therapy and Topic:  Group Therapy:  Feelings About Hospitalization  Participation Level:  Did Not Attend   Description of Group This process group involved patients discussing their feelings related to being hospitalized, as well as the benefits they see to being in the hospital.  These feelings and benefits were itemized.  The group then brainstormed specific ways in which they could seek those same benefits when they discharge and return home.  Therapeutic Goals 1. Patient will identify and describe positive and negative feelings related to hospitalization 2. Patient will verbalize benefits of hospitalization to themselves personally 3. Patients will brainstorm together ways they can obtain similar benefits in the outpatient setting, identify barriers to wellness and possible solutions  Summary of Patient Progress:  The patient was invited to group but did not attend.  Therapeutic Modalities Cognitive Behavioral Therapy Motivational Interviewing    Ambrose Mantle, LCSW 12/27/2019, 9:27 AM

## 2019-12-27 NOTE — Progress Notes (Signed)
Pt is back to the unit from the ED, pt appeared catatonic  at first interaction but responded after several attempt. Pt stated she was feeling good though appeared confused and took time to respond to questions asked. Pt given her night medication which she took without problems. Pt remains safe at this time, no falls observed or reported at this time, support and encouragement offered as needed, will continue to monitor.

## 2019-12-27 NOTE — Progress Notes (Signed)
IM injections administered without force and the pt gave permission. Education and support provided.

## 2019-12-27 NOTE — ED Provider Notes (Signed)
[facility] Emergency Department Provider Note MRN:  [id number]  Arrival date & time: 12/27/19     Chief Complaint   Fall   History of Present Illness   Lindsey Richmond is a 24 y.o. year-old female with a history of schizoaffective disorder presenting to the ED with chief complaint of fall.  Unwitnessed fall at the behavioral health hospital, endorsing head trauma, headache, neck pain. Denies vomiting, no chest pain or shortness of breath, no abdominal pain, no pain to the arms or legs, denies any lightheadedness or chest pain before the fall. Thinks that the medications she is on at the behavioral health hospital is making her sleepy.  I was unable to obtain an accurate HPI, PMH, or ROS due to the patient's schizophrenia.  Level 5 caveat.  Review of Systems  Positive for fall, head trauma.  Patient's Health History    Past Medical History:  Diagnosis Date  . Schizo affective schizophrenia ([place])     History reviewed. No pertinent surgical history.  History reviewed. No pertinent family history.  Social History   Socioeconomic History  . Marital status: Single    Spouse name: Not on file  . Number of children: Not on file  . Years of education: Not on file  . Highest education level: Not on file  Occupational History  . Occupation: Unemployed  Tobacco Use  . Smoking status: Never Smoker  . Smokeless tobacco: Never Used  Substance and Sexual Activity  . Alcohol use: Never  . Drug use: Never  . Sexual activity: Never  Other Topics Concern  . Not on file  Social History Narrative   Pt lives with her mother, brother, and father.  She receives outpatient psychiatric care through [facility]   Social Determinants of Health   Financial Resource Strain:   . Difficulty of Paying Living Expenses:   Food Insecurity:   . Worried About Charity fundraiser in the Last Year:   . Arboriculturist in the Last Year:   Transportation Needs:   . Lexicographer (Medical):   Marland Kitchen Lack of Transportation (Non-Medical):   Physical Activity:   . Days of Exercise per Week:   . Minutes of Exercise per Session:   Stress:   . Feeling of Stress :   Social Connections:   . Frequency of Communication with Friends and Family:   . Frequency of Social Gatherings with Friends and Family:   . Attends Religious Services:   . Active Member of Clubs or Organizations:   . Attends Archivist Meetings:   Marland Kitchen Marital Status:   Intimate Partner Violence:   . Fear of Current or Ex-Partner:   . Emotionally Abused:   Marland Kitchen Physically Abused:   . Sexually Abused:      Physical Exam   Vitals:   12/27/19 1648 12/27/19 1722  BP: 130/75 (!) 140/93  Pulse: (!) 118 (!) 110  Resp:  16  Temp:  98.1 F (36.7 C)  SpO2:  100%    CONSTITUTIONAL: Chronically ill-appearing, NAD NEURO: Mildly somnolent, no focal neurological deficits EYES:  eyes equal and reactive ENT/NECK:  no LAD, no JVD CARDIO: Tachycardic rate, well-perfused, normal S1 and S2 PULM:  CTAB no wheezing or rhonchi GI/GU:  normal bowel sounds, non-distended, non-tender MSK/SPINE:  No gross deformities, no edema SKIN:  no rash, atraumatic PSYCH: Intermittent inappropriate speech and behavior  *Additional and/or pertinent findings included in MDM below  Diagnostic and Interventional Summary  EKG Interpretation  Date/Time:  Saturday December 27 2019 18:25:00 EDT Ventricular Rate:  97 PR Interval:    QRS Duration: 85 QT Interval:  365 QTC Calculation: 464 R Axis:   44 Text Interpretation: Sinus rhythm Confirmed by Kennis Carina [phone]) on 12/27/2019 6:38:39 PM      Labs Reviewed  LIPID PANEL - Abnormal; Notable for the following components:      Result Value   HDL 24 (*)    LDL Cholesterol 109 (*)    All other components within normal limits  BASIC METABOLIC PANEL - Abnormal; Notable for the following components:   BUN 22 (*)    All other components within normal limits    TSH  HEMOGLOBIN A1C  URINALYSIS, ROUTINE W REFLEX MICROSCOPIC  RAPID URINE DRUG SCREEN, HOSP PERFORMED    CT Head Wo Contrast  Final Result    CT CERVICAL SPINE WO CONTRAST  Final Result      Medications  cloNIDine (CATAPRES) tablet 0.1 mg (0.1 mg Oral Not Given 12/27/19 0835)  OLANZapine (ZYPREXA) tablet 10 mg (10 mg Oral Not Given 12/27/19 0053)  benztropine (COGENTIN) tablet 0.5 mg (has no administration in time range)  OLANZapine zydis (ZYPREXA) disintegrating tablet 5 mg (has no administration in time range)    And  LORazepam (ATIVAN) tablet 1 mg (has no administration in time range)  LORazepam (ATIVAN) tablet 1 mg (1 mg Oral Not Given 12/27/19 1138)  haloperidol (HALDOL) tablet 5 mg (5 mg Oral Not Given 12/27/19 0835)    Or  haloperidol lactate (HALDOL) injection 5 mg ( Intramuscular See Alternative 12/27/19 0835)  LORazepam (ATIVAN) tablet 1 mg (1 mg Oral Not Given 12/27/19 1138)  LORazepam (ATIVAN) injection 2 mg (2 mg Intravenous Given 12/26/19 1852)  haloperidol lactate (HALDOL) injection 5 mg (5 mg Intramuscular Given 12/27/19 1147)  LORazepam (ATIVAN) injection 1 mg (1 mg Intramuscular Given 12/27/19 1147)     Procedures  /  Critical Care Procedures  ED Course and Medical Decision Making  I have reviewed the triage vital signs, the nursing notes, and pertinent available records from the EMR.  Pertinent labs & imaging results that were available during my care of the patient were reviewed by me and considered in my medical decision making (see below for details).     CT imaging to exclude significant injury.  Patient is acting abnormally however patient's behavioral health team is bedside and they stated she is acting at her psychiatric baseline.  CT imaging is normal, EKG normal, appropriate for discharge back to behavioral health.  Elmer Sow. Pilar Plate, MD [facility] Emergency Medicine [facility] [email or web address]  Final Clinical Impressions(s) / ED  Diagnoses     ICD-10-CM   1. Fall, initial encounter  W19.[facility]     ED Discharge Orders    None       Discharge Instructions Discussed with and Provided to Patient:     Discharge Instructions     You were evaluated in the Emergency Department and after careful evaluation, we did not find any emergent condition requiring admission or further testing in the hospital.  Your exam/testing today is overall reassuring.  Please return to the Emergency Department if you experience any worsening of your condition.  We encourage you to follow up with a primary care provider.  Thank you for allowing [place] to be a part of your care.       Sabas Sous, MD 12/27/19 Mikle Bosworth

## 2019-12-28 DIAGNOSIS — F200 Paranoid schizophrenia: Secondary | ICD-10-CM | POA: Diagnosis not present

## 2019-12-28 MED ORDER — OLANZAPINE 10 MG PO TBDP
10.0000 mg | ORAL_TABLET | Freq: Every day | ORAL | Status: DC
  Filled 2019-12-28 (×2): qty 1

## 2019-12-28 MED ORDER — LORAZEPAM 2 MG/ML IJ SOLN
1.0000 mg | INTRAMUSCULAR | Status: DC
  Administered 2019-12-28 – 2019-12-29 (×2): 1 mg via INTRAMUSCULAR
  Filled 2019-12-28: qty 1

## 2019-12-28 MED ORDER — HALOPERIDOL 5 MG PO TABS
10.0000 mg | ORAL_TABLET | Freq: Two times a day (BID) | ORAL | Status: DC
  Administered 2019-12-28 – 2019-12-29 (×2): 10 mg via ORAL
  Filled 2019-12-28 (×5): qty 2

## 2019-12-28 MED ORDER — LORAZEPAM 1 MG PO TABS
1.0000 mg | ORAL_TABLET | ORAL | Status: DC
  Administered 2019-12-28 – 2020-01-02 (×13): 1 mg via ORAL
  Filled 2019-12-28 (×15): qty 1

## 2019-12-28 MED ORDER — LORAZEPAM 2 MG/ML IJ SOLN
INTRAMUSCULAR | Status: AC
  Filled 2019-12-28: qty 1

## 2019-12-28 MED ORDER — HALOPERIDOL LACTATE 5 MG/ML IJ SOLN
10.0000 mg | Freq: Two times a day (BID) | INTRAMUSCULAR | Status: DC
  Filled 2019-12-28 (×4): qty 2

## 2019-12-28 NOTE — Plan of Care (Signed)
Patient encouraged to eat and drink fluids today.  Patient has been encouraged to take her oral medications.

## 2019-12-28 NOTE — Progress Notes (Signed)
Adult Psychoeducational Group Note  Date:  12/28/2019 Time:  9:02 PM  Group Topic/Focus:  Wrap-Up Group:   The focus of this group is to help patients review their daily goal of treatment and discuss progress on daily workbooks.  Participation Level:  Did Not Attend  Participation Quality:  Did Not Attend  Affect:  Did Not Attend  Cognitive:  Did Not Attend  Insight: None  Engagement in Group:  Did Not Attend  Modes of Intervention:  Did Not Attend  Additional Comments: Pt did not attend evening wrap up group tonight.  Felipa Furnace 12/28/2019, 9:02 PM

## 2019-12-28 NOTE — Progress Notes (Signed)
[facility] MD Progress Note  12/28/2019 10:23 AM Lindsey Richmond  MRN:  [id number] Subjective:  Patient is a 24 year old female with a past psychiatric history significant for schizophrenia who was admitted on 12/26/2019 secondary to paranoid thoughts.  She also appeared disorganized, isolating, hostile and trying to punch the mother.  Objective: Patient is seen and examined.  Patient is a 24 year old female with the above-stated past psychiatric history of schizophrenia who is seen in follow-up.  Unfortunately this morning she continues to have her biopsy flexibility, and is catatonic.  Last night she was found on the floor, reported that she had a fall.  She was sent to the emergency room.  Work-up was negative.  Patient was averbal this morning.  I discussed with nursing increasing the dosage of her Haldol.  She is currently on 5 mg of Haldol IM and 1 mg of Ativan IM 3 times daily.  Her blood pressure stable, but she remains mildly tachycardic.  Her rate this morning was initially 111, repeat was 136.  No new laboratories this morning.  Her EKG in the emergency room showed a normal sinus rhythm with a normal QTc interval.  He apparently did receive her Gean [place]ch[place] per her mother a week prior to admission.  Nursing notes reflect she slept 6.5 hours last night.  Principal Problem: <principal problem not specified> Diagnosis: Active Problems:   Schizophrenia (HCC)  Total Time spent with patient: 15 minutes  Past Psychiatric History: Admission H&P  Past Medical History:  Past Medical History:  Diagnosis Date  . Schizo affective schizophrenia (HCC)    History reviewed. No pertinent surgical history. Family History: History reviewed. No pertinent family history. Family Psychiatric  History: See admission H&P Social History:  Social History   Substance and Sexual Activity  Alcohol Use Never     Social History   Substance and Sexual Activity  Drug Use Never    Social History   Socioeconomic  History  . Marital status: Single    Spouse name: Not on file  . Number of children: Not on file  . Years of education: Not on file  . Highest education level: Not on file  Occupational History  . Occupation: Unemployed  Tobacco Use  . Smoking status: Never Smoker  . Smokeless tobacco: Never Used  Substance and Sexual Activity  . Alcohol use: Never  . Drug use: Never  . Sexual activity: Never  Other Topics Concern  . Not on file  Social History Narrative   Pt lives with her mother, brother, and father.  She receives outpatient psychiatric care through [facility].   Social Determinants of Health   Financial Resource Strain:   . Difficulty of Paying Living Expenses:   Food Insecurity:   . Worried About Programme researcher, broadcasting/film/video in the Last Year:   . Barista in the Last Year:   Transportation Needs:   . Freight forwarder (Medical):   Marland Kitchen Lack of Transportation (Non-Medical):   Physical Activity:   . Days of Exercise per Week:   . Minutes of Exercise per Session:   Stress:   . Feeling of Stress :   Social Connections:   . Frequency of Communication with Friends and Family:   . Frequency of Social Gatherings with Friends and Family:   . Attends Religious Services:   . Active Member of Clubs or Organizations:   . Attends Banker Meetings:   Marland Kitchen Marital Status:    Additional Social History:  Sleep: Good  Appetite:  Fair  Current Medications: Current Facility-Administered Medications  Medication Dose Route Frequency Provider Last Rate Last Admin  . benztropine (COGENTIN) tablet 0.5 mg  0.5 mg Oral BID PRN Cobos, Myer Peer, MD      . cloNIDine (CATAPRES) tablet 0.1 mg  0.1 mg Oral BID Rankin, Shuvon B, NP   0.1 mg at 12/25/19 1928  . haloperidol (HALDOL) tablet 10 mg  10 mg Oral BID Sharma Covert, MD       Or  . haloperidol lactate (HALDOL) injection 10 mg  10 mg Intramuscular BID Sharma Covert, MD      .  LORazepam (ATIVAN) tablet 1 mg  1 mg Oral [id number] Sharma Covert, MD       Or  . LORazepam (ATIVAN) injection 1 mg  1 mg Intramuscular [id number] Sharma Covert, MD   1 mg at 12/28/19 0906  . OLANZapine (ZYPREXA) tablet 10 mg  10 mg Oral QHS Rankin, Shuvon B, NP   10 mg at 12/27/19 2203  . OLANZapine zydis (ZYPREXA) disintegrating tablet 5 mg  5 mg Oral Q8H PRN Cobos, Myer Peer, MD        Lab Results:  Results for orders placed or performed during the hospital encounter of 12/25/19 (from the past 48 hour(s))  TSH     Status: None   Collection Time: 12/27/19  6:42 AM  Result Value Ref Range   TSH 3.090 0.350 - 4.500 uIU/mL    Comment: Performed by a 3rd Generation assay with a functional sensitivity of <=0.01 uIU/mL. Performed at [facility], [place] [address] [place], [place] [address]   Lipid panel     Status: Abnormal   Collection Time: 12/27/19  6:42 AM  Result Value Ref Range   Cholesterol 145 0 - 200 mg/dL   Triglycerides 59 <150 mg/dL   HDL 24 (L) >40 mg/dL   Total CHOL/HDL Ratio 6.0 RATIO   VLDL 12 0 - 40 mg/dL   LDL Cholesterol 109 (H) 0 - 99 mg/dL    Comment:        Total Cholesterol/HDL:CHD Risk Coronary Heart Disease Risk Table                     Men   Women  1/2 Average Risk   3.4   3.3  Average Risk       5.0   4.4  2 X Average Risk   9.6   7.1  3 X Average Risk  23.4   11.0        Use the calculated Patient Ratio above and the CHD Risk Table to determine the patient's CHD Risk.        ATP III CLASSIFICATION (LDL):  <100     mg/dL   Optimal  100-129  mg/dL   Near or Above                    Optimal  130-159  mg/dL   Borderline  160-189  mg/dL   High  >190     mg/dL   Very High Performed at [facility] [address], [place], [place] [address]   Hemoglobin A1c     Status: None   Collection Time: 12/27/19  6:42 AM  Result Value Ref Range   Hgb A1c MFr Bld 5.6 4.8 - 5.6 %    Comment: (NOTE) Pre  diabetes:  5.7%-6.4% Diabetes:              >6.4% Glycemic control for   <7.0% adults with diabetes    Mean Plasma Glucose 114.02 mg/dL    Comment: Performed at [facility], [address] [place], [place] [address]  Basic metabolic panel     Status: Abnormal   Collection Time: 12/27/19  6:42 AM  Result Value Ref Range   Sodium 139 135 - 145 mmol/L   Potassium 3.7 3.5 - 5.1 mmol/L   Chloride 103 98 - 111 mmol/L   CO2 24 22 - 32 mmol/L   Glucose, Bld 90 70 - 99 mg/dL    Comment: Glucose reference range applies only to samples taken after fasting for at least 8 hours.   BUN 22 (H) 6 - 20 mg/dL   Creatinine, Ser 0.93 0.44 - 1.00 mg/dL   Calcium 9.3 8.9 - 81.8 mg/dL   GFR calc non Af Amer >60 >60 mL/min   GFR calc Af Amer >60 >60 mL/min   Anion gap 12 5 - 15    Comment: Performed at [facility], [address], [place], [place] [address]    Blood Alcohol level:  Lab Results  Component Value Date   ETH <10 12/24/2019    Metabolic Disorder Labs: Lab Results  Component Value Date   HGBA1C 5.6 12/27/2019   MPG 114.02 12/27/2019   No results found for: PROLACTIN Lab Results  Component Value Date   CHOL 145 12/27/2019   TRIG 59 12/27/2019   HDL 24 (L) 12/27/2019   CHOLHDL 6.0 12/27/2019   VLDL 12 12/27/2019   LDLCALC 109 (H) 12/27/2019    Physical Findings: AIMS: Facial and Oral Movements Muscles of Facial Expression: None, normal Lips and Perioral Area: None, normal Jaw: None, normal Tongue: None, normal,Extremity Movements Upper (arms, wrists, hands, fingers): None, normal Lower (legs, knees, ankles, toes): None, normal, Trunk Movements Neck, shoulders, hips: None, normal, Overall Severity Severity of abnormal movements (highest score from questions above): None, normal Incapacitation due to abnormal movements: None, normal Patient's awareness of abnormal movements (rate only patient's report): No Awareness, Dental Status Current  problems with teeth and/or dentures?: No Does patient usually wear dentures?: No  CIWA:    COWS:     Musculoskeletal: Strength & Muscle Tone: within normal limits Gait & Station: shuffle Patient leans: N/A  Psychiatric Specialty Exam: Physical Exam  Nursing note and vitals reviewed. Constitutional: She appears well-developed and well-nourished.  HENT:  Head: Normocephalic and atraumatic.  Respiratory: Effort normal.    Review of Systems  Blood pressure 104/66, pulse (!) 136, temperature 98 F (36.7 C), temperature source Oral, resp. rate 16, height 5\' 4"  (1.626 m), weight 112.9 kg, last menstrual period 12/26/2019, SpO2 100 %.Body mass index is 42.74 kg/m.  General Appearance: Disheveled  Eye Contact:  Minimal  Speech:  Essentially mute  Volume:  Essentially mute  Mood:  NA  Affect:  Flat  Thought Process:  NA  Orientation:  Negative  Thought Content:  Delusions and Paranoid Ideation  Suicidal Thoughts:  No  Homicidal Thoughts:  No  Memory:  Immediate;   Essentially mute Recent;   Essentially mute Remote;   Essentially mute  Judgement:  Impaired  Insight:  Lacking  Psychomotor Activity:  Catatonic  Concentration:  Concentration: unable to be fully assessed and Attention Span: Unable to be fully assessed  Recall:  Essentially mute  Fund of Knowledge:  Poor  Language:  Essentially  mute  Akathisia:  Negative  Handed:  Right  AIMS (if indicated):     Assets:  Desire for Improvement Resilience  ADL's:  Impaired  Cognition:  Impaired,  Moderate  Sleep:  Number of Hours: 6.5     Treatment Plan Summary: Daily contact with patient to assess and evaluate symptoms and progress in treatment, Medication management and Plan : Patient seen and examined.  Patient is a 24 year old female with a past psychiatric history significant for schizophrenia who is seen in follow-up.   Diagnosis: #1 schizophrenia  Patient is seen in follow-up.  She continues to be essentially  catatonic.  I will increase her Haldol to 10 mg p.o./IM 3 times daily and Ativan 1 mg p.o./IM 3 times daily to try and relieve some of her catatonia.  She had reportedly a fall, and the work-up was negative.  Her QTc interval is normal.  She apparently received her long-acting paliperidone injection a week prior to admission, and was taking that with Zyprexa.  We will continue the Zyprexa at bedtime if she is willing to take it.  Otherwise we will continue pushing forward.  1.  Continue Cogentin 0.5 mg p.o. twice daily as needed tremors. 2.  Continue clonidine 0.1 mg p.o. twice daily for hypertension. 3.   Increase Haldol to 10 mg p.o. twice daily IM or p.o. for psychosis. 4.  Continue lorazepam 1 mg p.o. 3 times daily for catatonia. 5.  Continue Zyprexa 10 mg p.o. nightly for psychosis. 6.  Continue agitation protocol as needed. 7.  Disposition planning-in progress. 8.  Forced medication protocol may be necessary.  Antonieta Pert, MD 12/28/2019, 10:23 AM

## 2019-12-28 NOTE — Progress Notes (Signed)
Patient walked to med windows on [address] hall, sat in chair at med window, staff around patient.  Patient took her afternoon medicines, drank water, and was escorted by staff back to her room.  Respirations even and unlabored.  No signs/symptoms of pain/distress noted on patient's face/body movements.  Safety maintained with 15 minute checks.

## 2019-12-28 NOTE — [facility] Group Notes (Signed)
[facility] LCSW Group Therapy Note  Date/Time:  12/28/2019  11:00AM-12:00PM  Type of Therapy and Topic:  Group Therapy:  Music and Mood  Participation Level:  Did Not Attend   Description of Group: In this process group, members listened to a variety of genres of music and identified that different types of music evoke different responses.  Patients were encouraged to identify music that was soothing for them and music that was energizing for them.  Patients discussed how this knowledge can help with wellness and recovery in various ways including managing depression and anxiety as well as encouraging healthy sleep habits.    Therapeutic Goals: Patients will explore the impact of different varieties of music on mood Patients will verbalize the thoughts they have when listening to different types of music Patients will identify music that is soothing to them as well as music that is energizing to them Patients will discuss how to use this knowledge to assist in maintaining wellness and recovery Patients will explore the use of music as a coping skill  Summary of Patient Progress:  Patient was invited to group, did not attend.  Therapeutic Modalities: Solution Focused Brief Therapy Activity   Jacqualin Shirkey Grossman-Orr, LCSW    

## 2019-12-28 NOTE — Plan of Care (Signed)
Patient resting in bed through evening with regular unlabored respirations and radial pulse. Did respond minimally to RN calling her name but did not open eyes, moving about in bed without issue, declined to take medications. Monitoring for safety continues.    Problem: Education: Goal: Ability to state activities that reduce stress will improve Outcome: Not Progressing   Problem: Coping: Goal: Ability to identify and develop effective coping behavior will improve Outcome: Not Progressing   Problem: Self-Concept: Goal: Ability to identify factors that promote anxiety will improve Outcome: Not Progressing Goal: Level of anxiety will decrease Outcome: Not Progressing Goal: Ability to modify response to factors that promote anxiety will improve Outcome: Not Progressing   Problem: Education: Goal: Knowledge of [facility] Education information/materials will improve Outcome: Not Progressing Goal: Emotional status will improve Outcome: Not Progressing Goal: Mental status will improve Outcome: Not Progressing Goal: Verbalization of understanding the information provided will improve Outcome: Not Progressing   Problem: Activity: Goal: Interest or engagement in activities will improve Outcome: Not Progressing Goal: Sleeping patterns will improve Outcome: Not Progressing   Problem: Coping: Goal: Ability to verbalize frustrations and anger appropriately will improve Outcome: Not Progressing Goal: Ability to demonstrate self-control will improve Outcome: Not Progressing   Problem: Health Behavior/Discharge Planning: Goal: Identification of resources available to assist in meeting health care needs will improve Outcome: Not Progressing Goal: Compliance with treatment plan for underlying cause of condition will improve Outcome: Not Progressing   Problem: Physical Regulation: Goal: Ability to maintain clinical measurements within normal limits will improve Outcome: Not  Progressing   Problem: Safety: Goal: Periods of time without injury will increase Outcome: Not Progressing   Problem: Activity: Goal: Will identify at least one activity in which they can participate Outcome: Not Progressing   Problem: Coping: Goal: Ability to identify and develop effective coping behavior will improve Outcome: Not Progressing Goal: Ability to interact with others will improve Outcome: Not Progressing Goal: Demonstration of participation in decision-making regarding own care will improve Outcome: Not Progressing Goal: Ability to use eye contact when communicating with others will improve Outcome: Not Progressing   Problem: Health Behavior/Discharge Planning: Goal: Identification of resources available to assist in meeting health care needs will improve Outcome: Not Progressing   Problem: Self-Concept: Goal: Will verbalize positive feelings about self Outcome: Not Progressing   Problem: Activity: Goal: Will verbalize the importance of balancing activity with adequate rest periods Outcome: Not Progressing   Problem: Education: Goal: Will be free of psychotic symptoms Outcome: Not Progressing Goal: Knowledge of the prescribed therapeutic regimen will improve Outcome: Not Progressing   Problem: Coping: Goal: Coping ability will improve Outcome: Not Progressing Goal: Will verbalize feelings Outcome: Not Progressing   Problem: Health Behavior/Discharge Planning: Goal: Compliance with prescribed medication regimen will improve Outcome: Not Progressing   Problem: Nutritional: Goal: Ability to achieve adequate nutritional intake will improve Outcome: Not Progressing   Problem: Role Relationship: Goal: Ability to communicate needs accurately will improve Outcome: Not Progressing Goal: Ability to interact with others will improve Outcome: Not Progressing   Problem: Safety: Goal: Ability to redirect hostility and anger into socially appropriate  behaviors will improve Outcome: Not Progressing Goal: Ability to remain free from injury will improve Outcome: Not Progressing   Problem: Self-Care: Goal: Ability to participate in self-care as condition permits will improve Outcome: Not Progressing   Problem: Self-Concept: Goal: Will verbalize positive feelings about self Outcome: Not Progressing   Problem: Education: Goal: Ability to make informed decisions regarding treatment  will improve Outcome: Not Progressing   Problem: Coping: Goal: Coping ability will improve Outcome: Not Progressing   Problem: Health Behavior/Discharge Planning: Goal: Identification of resources available to assist in meeting health care needs will improve Outcome: Not Progressing   Problem: Medication: Goal: Compliance with prescribed medication regimen will improve Outcome: Not Progressing   Problem: Self-Concept: Goal: Ability to disclose and discuss suicidal ideas will improve Outcome: Not Progressing Goal: Will verbalize positive feelings about self Outcome: Not Progressing

## 2019-12-28 NOTE — Progress Notes (Signed)
Patient sitting on side of bed, staring.  Did not respond to nurse.  IM ativan 1 mg and IM haldol 0.5 given patient per MD orders. Patient did drink one cup of ginger ale. Respirations even and unlabored.  Safety maintained with 15 minute checks.

## 2019-12-28 NOTE — Progress Notes (Addendum)
Pt came out of her room this afternoon and asked for ginger ale. At that time she agreed to taking Ativan 1 mg PO as scheduled. The MHT on the hall reported that the pt was observed eating a small portion of her lunch this afternoon. Staff will continue to encourage the pt to eat and drink fluids. I will notify the pt's nurse, Meriam Sprague, RN., of the pt's change in behavior.

## 2019-12-29 DIAGNOSIS — F200 Paranoid schizophrenia: Secondary | ICD-10-CM | POA: Diagnosis not present

## 2019-12-29 LAB — URINALYSIS, ROUTINE W REFLEX MICROSCOPIC
Bilirubin Urine: NEGATIVE
Glucose, UA: NEGATIVE mg/dL
Ketones, ur: NEGATIVE mg/dL
Leukocytes,Ua: NEGATIVE
Nitrite: NEGATIVE
Protein, ur: 30 mg/dL — AB
Specific Gravity, Urine: 1.028 (ref 1.005–1.030)
pH: 5 (ref 5.0–8.0)

## 2019-12-29 LAB — RAPID URINE DRUG SCREEN, HOSP PERFORMED
Amphetamines: NOT DETECTED
Barbiturates: NOT DETECTED
Benzodiazepines: POSITIVE — AB
Cocaine: NOT DETECTED
Opiates: NOT DETECTED
Tetrahydrocannabinol: NOT DETECTED

## 2019-12-29 MED ORDER — CLONIDINE HCL 0.1 MG PO TABS: 0.1000 mg | ORAL_TABLET | Freq: Two times a day (BID) | ORAL | Status: DC | PRN

## 2019-12-29 MED ORDER — HALOPERIDOL 5 MG PO TABS: 10.0000 mg | ORAL_TABLET | Freq: Two times a day (BID) | ORAL | Status: DC | PRN

## 2019-12-29 MED ORDER — HALOPERIDOL LACTATE 5 MG/ML IJ SOLN
5.0000 mg | Freq: Two times a day (BID) | INTRAMUSCULAR | Status: DC
  Filled 2019-12-29 (×2): qty 1

## 2019-12-29 MED ORDER — RISPERIDONE 2 MG PO TBDP
2.0000 mg | ORAL_TABLET | Freq: Two times a day (BID) | ORAL | Status: DC
  Administered 2019-12-29 – 2020-01-01 (×7): 2 mg via ORAL
  Filled 2019-12-29 (×9): qty 1

## 2019-12-29 MED ORDER — HALOPERIDOL LACTATE 5 MG/ML IJ SOLN: 10.0000 mg | Freq: Two times a day (BID) | INTRAMUSCULAR | Status: DC | PRN

## 2019-12-29 MED ORDER — MODAFINIL 100 MG PO TABS
100.0000 mg | ORAL_TABLET | Freq: Every day | ORAL | Status: DC
  Administered 2019-12-29 – 2019-12-31 (×3): 100 mg via ORAL
  Filled 2019-12-29 (×3): qty 1

## 2019-12-29 MED ORDER — HALOPERIDOL 5 MG PO TABS
10.0000 mg | ORAL_TABLET | Freq: Two times a day (BID) | ORAL | Status: DC
  Filled 2019-12-29 (×2): qty 2

## 2019-12-29 NOTE — [facility] Group Notes (Signed)
LCSW Group Therapy Notes 12/29/2019 3:01 PM  Type of Therapy and Topic: Group Therapy: Overcoming Obstacles  Participation Level: Did Not Attend  Description of Group:  In this group patients will be encouraged to explore what they see as obstacles to their own wellness and recovery. They will be guided to discuss their thoughts, feelings, and behaviors related to these obstacles. The group will process together ways to cope with barriers, with attention given to specific choices patients can make. Each patient will be challenged to identify changes they are motivated to make in order to overcome their obstacles. This group will be process-oriented, with patients participating in exploration of their own experiences as well as giving and receiving support and challenge from other group members.  Therapeutic Goals: 1. Patient will identify personal and current obstacles as they relate to admission. 2. Patient will identify barriers that currently interfere with their wellness or overcoming obstacles.  3. Patient will identify feelings, thought process and behaviors related to these barriers. 4. Patient will identify two changes they are willing to make to overcome these obstacles:   Summary of Patient Progress Sleeping, did not attend.    Therapeutic Modalities:  Cognitive Behavioral Therapy Solution Focused Therapy Motivational Interviewing Relapse Prevention Therapy  Enid Cutter, MSW, [facility] 12/29/2019 3:01 PM
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
## 2019-12-29 NOTE — Progress Notes (Signed)
Recreation Therapy Notes  Date: 3.29.21 Time: 1000 Location: 500 Hall Dayroom  Group Topic: Coping Skills  Goal Area(s) Addresses:  Patient will identify positive coping skills. Patient will identify benefit of using coping skills.  Intervention: Worksheet, pencils, white board, marker  Activity: Mind Map.  LRT and patients filled out the first 8 boxes (anger, sadness, depression, anxiety, arguments, self esteem, lack of coping skills and guilt) of the mind map together.  Patients were then given time to come up with at least 3 coping skills for each area identified.  The group would come back together and LRT would write the coping skills on the board.  Education:Coping Skills, Discharge Planning.   Education Outcome: Acknowledges understanding/In group clarification offered/Needs additional education.   Clinical Observations/Feedback: Pt did not attend group session.    Caroll Rancher, LRT/CTRS        Caroll Rancher A 12/29/2019 11:51 AM

## 2019-12-29 NOTE — Progress Notes (Signed)
Adult Psychoeducational Group Note  Date:  12/29/2019 Time:  8:52 PM  Group Topic/Focus:  Wrap-Up Group:   The focus of this group is to help patients review their daily goal of treatment and discuss progress on daily workbooks.  Participation Level:  Did Not Attend  Participation Quality:  Did Not Attend  Affect:  Did Not Attend  Cognitive:  Did Not Attend  Insight: None  Engagement in Group:  Did Not Attend  Modes of Intervention:  Did Not Attend  Additional Comments:  Pt did not attend evening wrap up group tonight.  Felipa Furnace 12/29/2019, 8:52 PM

## 2019-12-29 NOTE — Plan of Care (Signed)
Pt resting in bed at start of shift, verbalized to RN she was okay and denied SI/HI/AVH. Refused to come to med window for HS meds. Pt found kneeling by window in room, declined to get up off floor, denied needs. Refused PO med and verbalized she would rather have IM. Med administered without issue. Pt continues kneeling on floor, refusing encouragement from staff to get up. Denies needs. Support given and safety monitoring continues.    Problem: Education: Goal: Mental status will improve Outcome: Progressing   Problem: Activity: Goal: Sleeping patterns will improve Outcome: Progressing   Problem: Coping: Goal: Ability to verbalize frustrations and anger appropriately will improve Outcome: Progressing   Problem: Safety: Goal: Periods of time without injury will increase Outcome: Progressing   Problem: Education: Goal: Will be free of psychotic symptoms Outcome: Progressing   Problem: Health Behavior/Discharge Planning: Goal: Compliance with prescribed medication regimen will improve Outcome: Progressing   Problem: Nutritional: Goal: Ability to achieve adequate nutritional intake will improve Outcome: Progressing   Problem: Safety: Goal: Ability to redirect hostility and anger into socially appropriate behaviors will improve Outcome: Progressing Goal: Ability to remain free from injury will improve Outcome: Progressing   Problem: Medication: Goal: Compliance with prescribed medication regimen will improve Outcome: Progressing

## 2019-12-29 NOTE — Plan of Care (Signed)
Pt is alert and oriented to name, refuses to answer other questions about orientation, is selectively mute, requires a great deal of encouragement to take her AM oral medication, but was eventually was medication complaint. Pt is able to make her needs known, came out with her pitcher requesting gatoraid to drink which was provided. Pt is hypoverbal, hypoactive, isolates in her room, affect blunted, mood depressed, makes poor eye contact, denies suicidal and homicidal ideation, denies hallucinations, slow to process, slow to respond. Pt has been resting in bed quietly for most of the shift. Pt has poor insight unable to report a reason for admission. Will continue to monitor pt per Q15 minute face checks and monitor for safety and progress.    Problem: Education: Goal: Ability to state activities that reduce stress will improve 12/29/2019 0959 by Ann Held, RN Outcome: Progressing 12/29/2019 0959 by Ann Held, RN Outcome: Progressing   Problem: Coping: Goal: Ability to identify and develop effective coping behavior will improve 12/29/2019 0959 by Ann Held, RN Outcome: Progressing 12/29/2019 0959 by Ann Held, RN Outcome: Progressing   Problem: Self-Concept: Goal: Ability to identify factors that promote anxiety will improve 12/29/2019 0959 by Ann Held, RN Outcome: Progressing 12/29/2019 0959 by Ann Held, RN Outcome: Progressing Goal: Level of anxiety will decrease 12/29/2019 0959 by Ann Held, RN Outcome: Progressing 12/29/2019 0959 by Ann Held, RN Outcome: Progressing Goal: Ability to modify response to factors that promote anxiety will improve 12/29/2019 0959 by Ann Held, RN Outcome: Progressing 12/29/2019 0959 by Ann Held, RN Outcome: Progressing   Problem: Education: Goal: Knowledge of [place] Education information/materials will improve 12/29/2019 0959 by Ann Held, RN Outcome: Progressing 12/29/2019  0959 by Ann Held, RN Outcome: Progressing Goal: Emotional status will improve 12/29/2019 0959 by Ann Held, RN Outcome: Progressing 12/29/2019 0959 by Ann Held, RN Outcome: Progressing Goal: Mental status will improve 12/29/2019 0959 by Ann Held, RN Outcome: Progressing 12/29/2019 0959 by Ann Held, RN Outcome: Progressing Goal: Verbalization of understanding the information provided will improve 12/29/2019 0959 by Ann Held, RN Outcome: Progressing 12/29/2019 0959 by Ann Held, RN Outcome: Progressing   Problem: Activity: Goal: Interest or engagement in activities will improve 12/29/2019 0959 by Ann Held, RN Outcome: Progressing 12/29/2019 0959 by Ann Held, RN Outcome: Progressing Goal: Sleeping patterns will improve 12/29/2019 0959 by Ann Held, RN Outcome: Progressing 12/29/2019 0959 by Ann Held, RN Outcome: Progressing   Problem: Coping: Goal: Ability to verbalize frustrations and anger appropriately will improve 12/29/2019 0959 by Ann Held, RN Outcome: Progressing 12/29/2019 0959 by Ann Held, RN Outcome: Progressing Goal: Ability to demonstrate self-control will improve 12/29/2019 0959 by Ann Held, RN Outcome: Progressing 12/29/2019 0959 by Ann Held, RN Outcome: Progressing   Problem: Health Behavior/Discharge Planning: Goal: Identification of resources available to assist in meeting health care needs will improve 12/29/2019 0959 by Ann Held, RN Outcome: Progressing 12/29/2019 0959 by Ann Held, RN Outcome: Progressing Goal: Compliance with treatment plan for underlying cause of condition will improve 12/29/2019 0959 by Ann Held, RN Outcome: Progressing 12/29/2019 0959 by Ann Held, RN Outcome: Progressing   Problem: Physical Regulation: Goal: Ability to maintain clinical measurements within normal limits will improve 12/29/2019 0959 by Ann Held, RN Outcome: Progressing 12/29/2019 0959 by Ann Held, RN Outcome: Progressing   Problem: Safety: Goal: Periods  of time without injury will increase 12/29/2019 0959 by Ginger Carne, RN Outcome: Progressing 12/29/2019 0959 by Ginger Carne, RN Outcome: Progressing   Problem: Activity: Goal: Will identify at least one activity in which they can participate 12/29/2019 0959 by Ginger Carne, RN Outcome: Progressing 12/29/2019 0959 by Ginger Carne, RN Outcome: Progressing   Problem: Coping: Goal: Ability to identify and develop effective coping behavior will improve 12/29/2019 0959 by Ginger Carne, RN Outcome: Progressing 12/29/2019 0959 by Ginger Carne, RN Outcome: Progressing Goal: Ability to interact with others will improve 12/29/2019 0959 by Ginger Carne, RN Outcome: Progressing 12/29/2019 0959 by Ginger Carne, RN Outcome: Progressing Goal: Demonstration of participation in decision-making regarding own care will improve 12/29/2019 0959 by Ginger Carne, RN Outcome: Progressing 12/29/2019 0959 by Ginger Carne, RN Outcome: Progressing Goal: Ability to use eye contact when communicating with others will improve 12/29/2019 0959 by Ginger Carne, RN Outcome: Progressing 12/29/2019 0959 by Ginger Carne, RN Outcome: Progressing   Problem: Health Behavior/Discharge Planning: Goal: Identification of resources available to assist in meeting health care needs will improve 12/29/2019 0959 by Ginger Carne, RN Outcome: Progressing 12/29/2019 0959 by Ginger Carne, RN Outcome: Progressing   Problem: Self-Concept: Goal: Will verbalize positive feelings about self 12/29/2019 0959 by Ginger Carne, RN Outcome: Progressing 12/29/2019 0959 by Ginger Carne, RN Outcome: Progressing   Problem: Activity: Goal: Will verbalize the importance of balancing activity with adequate rest periods 12/29/2019 0959 by Ginger Carne, RN Outcome:  Progressing 12/29/2019 0959 by Ginger Carne, RN Outcome: Progressing   Problem: Education: Goal: Will be free of psychotic symptoms 12/29/2019 0959 by Ginger Carne, RN Outcome: Progressing 12/29/2019 0959 by Ginger Carne, RN Outcome: Progressing Goal: Knowledge of the prescribed therapeutic regimen will improve 12/29/2019 0959 by Ginger Carne, RN Outcome: Progressing 12/29/2019 0959 by Ginger Carne, RN Outcome: Progressing   Problem: Coping: Goal: Coping ability will improve 12/29/2019 0959 by Ginger Carne, RN Outcome: Progressing 12/29/2019 0959 by Ginger Carne, RN Outcome: Progressing Goal: Will verbalize feelings 12/29/2019 0959 by Ginger Carne, RN Outcome: Progressing 12/29/2019 0959 by Ginger Carne, RN Outcome: Progressing   Problem: Health Behavior/Discharge Planning: Goal: Compliance with prescribed medication regimen will improve 12/29/2019 0959 by Ginger Carne, RN Outcome: Progressing 12/29/2019 0959 by Ginger Carne, RN Outcome: Progressing   Problem: Nutritional: Goal: Ability to achieve adequate nutritional intake will improve 12/29/2019 0959 by Ginger Carne, RN Outcome: Progressing 12/29/2019 0959 by Ginger Carne, RN Outcome: Progressing   Problem: Role Relationship: Goal: Ability to communicate needs accurately will improve 12/29/2019 0959 by Ginger Carne, RN Outcome: Progressing 12/29/2019 0959 by Ginger Carne, RN Outcome: Progressing Goal: Ability to interact with others will improve 12/29/2019 0959 by Ginger Carne, RN Outcome: Progressing 12/29/2019 0959 by Ginger Carne, RN Outcome: Progressing   Problem: Safety: Goal: Ability to redirect hostility and anger into socially appropriate behaviors will improve 12/29/2019 0959 by Ginger Carne, RN Outcome: Progressing 12/29/2019 0959 by Ginger Carne, RN Outcome: Progressing Goal: Ability to remain free from injury will improve 12/29/2019 0959 by Ginger Carne, RN Outcome: Progressing 12/29/2019 0959 by Ginger Carne, RN Outcome: Progressing   Problem: Self-Care: Goal: Ability to participate in self-care as condition permits will improve 12/29/2019 0959 by Ginger Carne, RN Outcome: Progressing 12/29/2019 0959 by Ginger Carne, RN Outcome: Progressing  Problem: Self-Concept: Goal: Will verbalize positive feelings about self 12/29/2019 0959 by Ginger Carne, RN Outcome: Progressing 12/29/2019 0959 by Ginger Carne, RN Outcome: Progressing   Problem: Education: Goal: Ability to make informed decisions regarding treatment will improve 12/29/2019 0959 by Ginger Carne, RN Outcome: Progressing 12/29/2019 0959 by Ginger Carne, RN Outcome: Progressing   Problem: Coping: Goal: Coping ability will improve 12/29/2019 0959 by Ginger Carne, RN Outcome: Progressing 12/29/2019 0959 by Ginger Carne, RN Outcome: Progressing   Problem: Health Behavior/Discharge Planning: Goal: Identification of resources available to assist in meeting health care needs will improve 12/29/2019 0959 by Ginger Carne, RN Outcome: Progressing 12/29/2019 0959 by Ginger Carne, RN Outcome: Progressing   Problem: Medication: Goal: Compliance with prescribed medication regimen will improve 12/29/2019 0959 by Ginger Carne, RN Outcome: Progressing 12/29/2019 0959 by Ginger Carne, RN Outcome: Progressing   Problem: Self-Concept: Goal: Ability to disclose and discuss suicidal ideas will improve 12/29/2019 0959 by Ginger Carne, RN Outcome: Progressing 12/29/2019 0959 by Ginger Carne, RN Outcome: Progressing Goal: Will verbalize positive feelings about self 12/29/2019 0959 by Ginger Carne, RN Outcome: Progressing 12/29/2019 0959 by Ginger Carne, RN Outcome: Progressing

## 2019-12-30 DIAGNOSIS — F200 Paranoid schizophrenia: Secondary | ICD-10-CM | POA: Diagnosis not present

## 2019-12-30 MED ORDER — MIRTAZAPINE 15 MG PO TBDP
15.0000 mg | ORAL_TABLET | Freq: Every day | ORAL | Status: DC
  Administered 2019-12-30: 15 mg via ORAL
  Filled 2019-12-30 (×3): qty 1

## 2019-12-30 NOTE — Progress Notes (Signed)
   12/30/19 0900  Psych Admission Type (Psych Patients Only)  Admission Status Involuntary  Psychosocial Assessment  Patient Complaints None  Eye Contact Avoids  Facial Expression Flat  Affect Blunted;Preoccupied;Flat  Speech Soft;Slow;Elective mutism  Interaction Childlike;Guarded;Minimal;No initiation  Motor Activity Slow;Rigidity  Appearance/Hygiene Disheveled;Body odor  Behavior Characteristics Calm  Mood Sullen  Thought Process  Coherency Blocking;Disorganized  Content UTA  Delusions None reported or observed  Perception UTA  Hallucination None reported or observed  Judgment Limited  Confusion Mild  Danger to Self  Current suicidal ideation? Denies  Danger to Others  Danger to Others None reported or observed
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
## 2019-12-30 NOTE — Progress Notes (Signed)
   12/30/19 2000  Psych Admission Type (Psych Patients Only)  Admission Status Involuntary  Psychosocial Assessment  Patient Complaints None  Eye Contact Fair  Facial Expression Flat  Affect Blunted;Flat  Speech Soft;Logical/coherent  Interaction Cautious  Motor Activity Slow  Appearance/Hygiene Disheveled  Behavior Characteristics Cooperative;Calm  Mood Ambivalent  Thought Process  Coherency Blocking  Content Preoccupation  Delusions None reported or observed  Perception WDL  Hallucination None reported or observed  Judgment Limited  Confusion None  Danger to Self  Current suicidal ideation? Denies  Danger to Others  Danger to Others None reported or observed   D: Patient primarily in room through evening but did come out for medications and snack. Denies SI/HI/AVH. More verbal and interactive with staff, brighter affect than last evening.  A: PO medications administered as prescribed. Needs met as able. Support and encouragement provided.  R: Patient remains safe on the unit. Will continue to monitor for safety and stability.

## 2019-12-30 NOTE — Progress Notes (Signed)
Recreation Therapy Notes  Date: 3.30.21 Time: 0950 Location: [address] Dayroom  Group Topic: Wellness  Goal Area(s) Addresses:  Patient will define components of whole wellness. Patient will verbalize benefit of whole wellness.  Intervention: Music  Activity:  Exercise.  LRT led patients in a series of stretches to loosen up the muscles and body.  Each patient took turns in leading the group in an exercise of their choosing.  Patients were encouraged to take breaks and drink water as needed.  Education:Wellness, Discharge Planning.   Education Outcome: Acknowledges education/In group clarification offered/Needs additional education.   Clinical Observations/Feedback:  Pt did not attend group session.   Caroll Rancher, LRT/CTRS        Caroll Rancher A 12/30/2019 11:16 AM

## 2019-12-30 NOTE — Progress Notes (Signed)
Recreation Therapy Notes  INPATIENT RECREATION THERAPY ASSESSMENT  Patient Details Name: Lindsey Richmond MRN: [id number] DOB: June 28, 1996 Today's Date: 12/30/2019       Information Obtained From: Chart Review  Reason for Admission (Per Patient): Med Non-Compliance, Other (Comments)(Aggression; Hallucinations)  Patient Stressors: (None identified)  Coping Skills:   Isolation, Aggression  Leisure Interests (2+):  Community - Other (Comment)([place])  [place] of Residence:  [place]  Patient Main Form of Transportation: Car  Patient Strengths:  Cleaning  Patient Identified Areas of Improvement:  None  Patient Goal for Hospitalization:  get well  Staff Intervention Plan: Group Attendance, Collaborate with Interdisciplinary Treatment Team  Consent to Intern Participation: N/A    Caroll Rancher, LRT/CTRS  Caroll Rancher A 12/30/2019, 11:58 AM

## 2019-12-31 DIAGNOSIS — F200 Paranoid schizophrenia: Secondary | ICD-10-CM | POA: Diagnosis not present

## 2019-12-31 MED ORDER — TEMAZEPAM 30 MG PO CAPS
30.0000 mg | ORAL_CAPSULE | Freq: Every day | ORAL | Status: DC
  Administered 2019-12-31: 30 mg via ORAL
  Filled 2019-12-31: qty 1

## 2019-12-31 MED ORDER — MODAFINIL 200 MG PO TABS
200.0000 mg | ORAL_TABLET | Freq: Every day | ORAL | Status: DC
  Administered 2020-01-01 – 2020-01-02 (×2): 200 mg via ORAL
  Filled 2019-12-31 (×2): qty 1

## 2019-12-31 NOTE — Progress Notes (Signed)
D. Pt presents with a flat affect/ depressed mood- calm and cooperative behavior on the unit- per pt's self inventory, pt rated her depression, hopelessness and anxiety all 0's. Pt writes that her goal today is "getting home safely" and writes that she will "participate" to help her to meet that goal.   Pt currently denies SI/HI and AVH  A. Labs and vitals monitored. Pt compliant with medications. Pt supported emotionally and encouraged to express concerns and ask questions.   R. Pt remains safe with 15 minute checks. Will continue POC.

## 2019-12-31 NOTE — Progress Notes (Signed)
[facility] MD Progress Note  12/31/2019 9:28 AM Lindsey Richmond  MRN:  [id number] Subjective:    Patient's  catatonia has resolved and she is alert and oriented and cooperative affect is still flat and she appears sluggish and does appear overmedicated that we have discontinued lorazepam. Denies hallucinations Denies thoughts of harming self or others Principal Problem:recent catatonic symptoms Diagnosis: Active Problems:   Schizophrenia (HCC)  Total Time spent with patient: 20 minutes  Past Psychiatric History: see eval  Past Medical History:  Past Medical History:  Diagnosis Date  . Schizo affective schizophrenia (HCC)    History reviewed. No pertinent surgical history. Family History: History reviewed. No pertinent family history. Family Psychiatric  History: see eval Social History:  Social History   Substance and Sexual Activity  Alcohol Use Never     Social History   Substance and Sexual Activity  Drug Use Never    Social History   Socioeconomic History  . Marital status: Single    Spouse name: Not on file  . Number of children: Not on file  . Years of education: Not on file  . Highest education level: Not on file  Occupational History  . Occupation: Unemployed  Tobacco Use  . Smoking status: Never Smoker  . Smokeless tobacco: Never Used  Substance and Sexual Activity  . Alcohol use: Never  . Drug use: Never  . Sexual activity: Never  Other Topics Concern  . Not on file  Social History Narrative   Pt lives with her mother, brother, and father.  She receives outpatient psychiatric care through [facility].   Social Determinants of Health   Financial Resource Strain:   . Difficulty of Paying Living Expenses:   Food Insecurity:   . Worried About Programme researcher, broadcasting/film/video in the Last Year:   . Barista in the Last Year:   Transportation Needs:   . Freight forwarder (Medical):   Marland Kitchen Lack of Transportation (Non-Medical):   Physical Activity:   . Days of  Exercise per Week:   . Minutes of Exercise per Session:   Stress:   . Feeling of Stress :   Social Connections:   . Frequency of Communication with Friends and Family:   . Frequency of Social Gatherings with Friends and Family:   . Attends Religious Services:   . Active Member of Clubs or Organizations:   . Attends Banker Meetings:   Marland Kitchen Marital Status:     Sleep:poor Appetite:  Good  Current Medications: Current Facility-Administered Medications  Medication Dose Route Frequency Provider Last Rate Last Admin  . benztropine (COGENTIN) tablet 0.5 mg  0.5 mg Oral BID PRN Lindsey Richmond, Lindsey Situ, MD      . cloNIDine (CATAPRES) tablet 0.1 mg  0.1 mg Oral BID PRN Lindsey Johns, MD      . LORazepam (ATIVAN) tablet 1 mg  1 mg Oral [id number] Lindsey Pert, MD   1 mg at 12/31/19 1027   Or  . LORazepam (ATIVAN) injection 1 mg  1 mg Intramuscular [id number] Lindsey Pert, MD   1 mg at 12/29/19 2121  . mirtazapine (REMERON SOL-TAB) disintegrating tablet 15 mg  15 mg Oral QHS Lindsey Johns, MD   15 mg at 12/30/19 2033  . [START ON 01/01/2020] modafinil (PROVIGIL) tablet 200 mg  200 mg Oral Daily Lindsey Johns, MD      . OLANZapine zydis (ZYPREXA) disintegrating tablet 5 mg  5 mg Oral Q8H PRN Lindsey Richmond, Lindsey Situ, MD      .  risperiDONE (RISPERDAL M-TABS) disintegrating tablet 2 mg  2 mg Oral BID Lindsey Hai, MD   2 mg at 12/31/19 4627    Lab Results: No results found for this or any previous visit (from the past 54 hour(s)).  Blood Alcohol level:  Lab Results  Component Value Date   ETH <10 03/50/0938    Metabolic Disorder Labs: Lab Results  Component Value Date   HGBA1C 5.6 12/27/2019   MPG 114.02 12/27/2019   No results found for: PROLACTIN Lab Results  Component Value Date   CHOL 145 12/27/2019   TRIG 59 12/27/2019   HDL 24 (L) 12/27/2019   CHOLHDL 6.0 12/27/2019   VLDL 12 12/27/2019   LDLCALC 109 (H) 12/27/2019    Physical Findings: AIMS: Facial and Oral  Movements Muscles of Facial Expression: None, normal Lips and Perioral Area: None, normal Jaw: None, normal Tongue: None, normal,Extremity Movements Upper (arms, wrists, hands, fingers): None, normal Lower (legs, knees, ankles, toes): None, normal, Trunk Movements Neck, shoulders, hips: None, normal, Overall Severity Severity of abnormal movements (highest score from questions above): None, normal Incapacitation due to abnormal movements: None, normal Patient's awareness of abnormal movements (rate only patient's report): No Awareness, Dental Status Current problems with teeth and/or dentures?: No Does patient usually wear dentures?: No  CIWA:    COWS:     Musculoskeletal: Strength & Muscle Tone: within normal limits Gait & Station: normal Patient leans: N/A  Psychiatric Specialty Exam: Physical Exam  Review of Systems  Blood pressure 123/73, pulse (!) 115, temperature 97.8 F (36.6 C), temperature source Oral, resp. rate 16, height 5\' 4"  (1.626 m), weight 112.9 kg, last menstrual period 12/26/2019, SpO2 98 %.Body mass index is 42.74 kg/m.  General Appearance: Casual  Eye Contact:  Good  Speech:  Clear and Coherent  Volume:low  Mood:  Dysphoric  Affect:  Flat  Thought Process:  Coherent and Goal Directed  Orientation:  Full (Time, Place, and Person)  Thought Content:  Logical  Suicidal Thoughts:  No  Homicidal Thoughts:  No  Memory:  Immediate;   Fair Recent;   Poor Remote;   Fair  Judgement:  Fair  Insight:  Fair  Psychomotor Activity:  Normal  Concentration:  Concentration: Fair and Attention Span: Fair  Recall:  [employer] of Knowledge:  Fair  Language:  Fair  Akathisia:  Negative  Handed:  Right  AIMS (if indicated):     Assets:  Resilience Social Support  ADL's:  Intact  Cognition:  WNL  Sleep:  Number of Hours: 0.5     Treatment Plan Summary: Daily contact with patient to assess and evaluate symptoms and progress in treatment and Medication management   Was very poor we will add a new sleep medication escalate modafinil in the daytime  Lindsey Shartzer, MD 12/31/2019, 9:28 AM

## 2019-12-31 NOTE — Progress Notes (Signed)
Recreation Therapy Notes  Date: 3.31.21 Time: 1000 Location: 500 Hall Dayroom  Group Topic: Communication, Team Building, Problem Solving  Goal Area(s) Addresses:  Patient will effectively work with peer towards shared goal.  Patient will identify skill used to make activity successful.  Patient will identify how skills used during activity can be used to reach post d/c goals.   Behavioral Response: Engaged  Intervention: STEM Activity   Activity: In team's, using 10 red plastic cups and a rubber band with strings attached, patients were asked to flip the cups over and stack them into a pyramid.    Education: Pharmacist, community, Building control surveyor.   Education Outcome: Acknowledges education/In group clarification offered/Needs additional education.   Clinical Observations/Feedback: Pt was appropriate and active during activity.  Pt seemed to have eyes closed at times during activity and was easily directed to open eyes.  Pt worked well with peers to complete the activity.  Pt expressed the group used persistence to complete the task.    Caroll Rancher, LRT/CTRS    Caroll Rancher A 12/31/2019 11:48 AM

## 2019-12-31 NOTE — [facility] Group Notes (Signed)
LCSW Aftercare Discharge Planning Group Note  12/31/2019  Type of Group and Topic: Psychoeducational Group: Discharge Planning  Participation Level: Active  Description of Group  Discharge planning group reviews patient's anticipated discharge plans and assists patients to anticipate and address any barriers to wellness/recovery in the community. Suicide prevention education is reviewed with patients in group.  Therapeutic Goals  1. Patients will state their anticipated discharge plan and mental health aftercare  2. Patients will identify potential barriers to wellness in the community setting  3. Patients will engage in problem solving, solution focused discussion of ways to anticipate and address barriers to wellness/recovery  Summary of Patient Progress  Plan for Discharge/Comments: Plans to return home with family, "hopefully things will get better." She is established with [facility] and plans to continue receiving medication management from this provider.  Transportation Means: Parents provide transportation.  Supports: Parents, friends  Therapeutic Modalities:  Motivational Interviewing  Darreld Mclean, [place]  12/31/2019 1:52 PM

## 2019-12-31 NOTE — Tx Team (Signed)
Interdisciplinary Treatment and Diagnostic Plan Update  12/31/2019 Time of Session: 900am Lindsey Richmond MRN: [id number]  Principal Diagnosis: <principal problem not specified>  Secondary Diagnoses: Active Problems:   Schizophrenia (HCC)   Current Medications:  Current Facility-Administered Medications  Medication Dose Route Frequency Provider Last Rate Last Admin  . benztropine (COGENTIN) tablet 0.5 mg  0.5 mg Oral BID PRN Cobos, Rockey Situ, MD      . cloNIDine (CATAPRES) tablet 0.1 mg  0.1 mg Oral BID PRN Malvin Johns, MD      . LORazepam (ATIVAN) tablet 1 mg  1 mg Oral [id number] Antonieta Pert, MD   1 mg at 12/31/19 7253   Or  . LORazepam (ATIVAN) injection 1 mg  1 mg Intramuscular [id number] Antonieta Pert, MD   1 mg at 12/29/19 2121  . [START ON 01/01/2020] modafinil (PROVIGIL) tablet 200 mg  200 mg Oral Daily Malvin Johns, MD      . OLANZapine zydis [facility]) disintegrating tablet 5 mg  5 mg Oral Q8H PRN Cobos, Rockey Situ, MD      . risperiDONE (RISPERDAL M-TABS) disintegrating tablet 2 mg  2 mg Oral BID Malvin Johns, MD   2 mg at 12/31/19 6644  . temazepam (RESTORIL) capsule 30 mg  30 mg Oral QHS Malvin Johns, MD       PTA Medications: Medications Prior to Admission  Medication Sig Dispense Refill Last Dose  . benztropine (COGENTIN) 0.5 MG tablet Take 0.5 mg by mouth 2 (two) times daily.     . cloNIDine (CATAPRES) 0.1 MG tablet Take 0.1 mg by mouth 2 (two) times daily.     Marland Kitchen OLANZapine (ZYPREXA) 10 MG tablet Take 10 mg by mouth at bedtime.     . paliperidone (INVEGA SUSTENNA) 156 MG/ML SUSY injection Inject 156 mg into the muscle every 30 (thirty) days.     . traZODone (DESYREL) 150 MG tablet Take 150 mg by mouth at bedtime.       Patient Stressors: Marital or family conflict Medication change or noncompliance  Patient Strengths: Physical Health Supportive family/friends  Treatment Modalities: Medication Management, Group therapy, Case management,  1 to 1 session  with clinician, Psychoeducation, Recreational therapy.   Physician Treatment Plan for Primary Diagnosis: <principal problem not specified> Long Term Goal(s): Improvement in symptoms so as ready for discharge Improvement in symptoms so as ready for discharge   Short Term Goals: Ability to identify changes in lifestyle to reduce recurrence of condition will improve Ability to verbalize feelings will improve Ability to disclose and discuss suicidal ideas Ability to demonstrate self-control will improve Ability to identify and develop effective coping behaviors will improve Ability to maintain clinical measurements within normal limits will improve Compliance with prescribed medications will improve Ability to identify changes in lifestyle to reduce recurrence of condition will improve Ability to verbalize feelings will improve Ability to disclose and discuss suicidal ideas Ability to demonstrate self-control will improve Ability to identify and develop effective coping behaviors will improve Ability to maintain clinical measurements within normal limits will improve Compliance with prescribed medications will improve  Medication Management: Evaluate patient's response, side effects, and tolerance of medication regimen.  Therapeutic Interventions: 1 to 1 sessions, Unit Group sessions and Medication administration.  Evaluation of Outcomes: Progressing  Physician Treatment Plan for Secondary Diagnosis: Active Problems:   Schizophrenia (HCC)  Long Term Goal(s): Improvement in symptoms so as ready for discharge Improvement in symptoms so as ready for discharge   Short Term Goals: Ability to  identify changes in lifestyle to reduce recurrence of condition will improve Ability to verbalize feelings will improve Ability to disclose and discuss suicidal ideas Ability to demonstrate self-control will improve Ability to identify and develop effective coping behaviors will improve Ability to  maintain clinical measurements within normal limits will improve Compliance with prescribed medications will improve Ability to identify changes in lifestyle to reduce recurrence of condition will improve Ability to verbalize feelings will improve Ability to disclose and discuss suicidal ideas Ability to demonstrate self-control will improve Ability to identify and develop effective coping behaviors will improve Ability to maintain clinical measurements within normal limits will improve Compliance with prescribed medications will improve     Medication Management: Evaluate patient's response, side effects, and tolerance of medication regimen.  Therapeutic Interventions: 1 to 1 sessions, Unit Group sessions and Medication administration.  Evaluation of Outcomes: Progressing   RN Treatment Plan for Primary Diagnosis: <principal problem not specified> Long Term Goal(s): Knowledge of disease and therapeutic regimen to maintain health will improve  Short Term Goals: Ability to participate in decision making will improve, Ability to verbalize feelings will improve and Compliance with prescribed medications will improve  Medication Management: RN will administer medications as ordered by provider, will assess and evaluate patient's response and provide education to patient for prescribed medication. RN will report any adverse and/or side effects to prescribing provider.  Therapeutic Interventions: 1 on 1 counseling sessions, Psychoeducation, Medication administration, Evaluate responses to treatment, Monitor vital signs and CBGs as ordered, Perform/monitor CIWA, COWS, AIMS and Fall Risk screenings as ordered, Perform wound care treatments as ordered.  Evaluation of Outcomes: Progressing   LCSW Treatment Plan for Primary Diagnosis: <principal problem not specified> Long Term Goal(s): Safe transition to appropriate next level of care at discharge, Engage patient in therapeutic group addressing  interpersonal concerns.  Short Term Goals: Engage patient in aftercare planning with referrals and resources, Increase social support, Facilitate acceptance of mental health diagnosis and concerns, Identify triggers associated with mental health/substance abuse issues and Increase skills for wellness and recovery  Therapeutic Interventions: Assess for all discharge needs, 1 to 1 time with Social worker, Explore available resources and support systems, Assess for adequacy in community support network, Educate family and significant other(s) on suicide prevention, Complete Psychosocial Assessment, Interpersonal group therapy.  Evaluation of Outcomes: Progressing   Progress in Treatment: Attending groups: No. Participating in groups: No. Taking medication as prescribed: Yes. Toleration medication: Yes. Family/Significant other contact made: No, will contact:  when consent given Patient understands diagnosis: No. Discussing patient identified problems/goals with staff: No. Medical problems stabilized or resolved: Yes. Denies suicidal/homicidal ideation: No. Issues/concerns per patient self-inventory: No. Other: N/A  New problem(s) identified: No, Describe:  none  New Short Term/Long Term Goal(s): Detox, elimination of AVH/symptoms of psychosis, medication management for mood stabilization; elimination of SI thoughts; development of comprehensive mental wellness/sobriety plan.  Patient Goals:  Patient was nonverbal and did not provide a goal  Discharge Plan or Barriers: Patient is expected to return home with family and continue outpatient treatment with her providers at [facility].  Reason for Continuation of Hospitalization: Medication stabilization  Estimated Length of Stay: 3-5 days  Attendees: Patient: Lindsey Richmond 12/31/2019 9:37 AM  Physician: Dr Parke Poisson MD Dr.Farah 12/31/2019 9:37 AM  Nursing:  12/31/2019 9:37 AM  RN Care Manager: 12/31/2019 9:37 AM  Social Worker: Minette Brine Moton  LCSW [place], [place] 12/31/2019 9:37 AM  Recreational Therapist:  12/31/2019 9:37 AM  Other:  12/31/2019 9:37 AM  Other:  12/31/2019 9:37 AM  Other: 12/31/2019 9:37 AM    Scribe for Treatment Team: Darreld Mclean, LCSWA 12/31/2019 9:37 AM

## 2019-12-31 NOTE — Progress Notes (Signed)
CSW spoke with patient's [facility], [place] [phone]). CSW reviewed plan for outpatient follow up and contact information with Care Coordinator.  Enid Cutter, MSW, LCSW-A Clinical Social Worker [facility] Adult [facility]

## 2020-01-01 DIAGNOSIS — F200 Paranoid schizophrenia: Secondary | ICD-10-CM | POA: Diagnosis not present

## 2020-01-01 MED ORDER — RISPERIDONE 2 MG PO TBDP
3.0000 mg | ORAL_TABLET | Freq: Every day | ORAL | Status: DC
  Filled 2020-01-01 (×2): qty 1

## 2020-01-01 NOTE — Plan of Care (Signed)
  Problem: Coping: Goal: Coping ability will improve Outcome: Progressing   Problem: Medication: Goal: Compliance with prescribed medication regimen will improve Outcome: Progressing   Problem: Self-Concept: Goal: Ability to disclose and discuss suicidal ideas will improve Outcome: Progressing

## 2020-01-01 NOTE — Progress Notes (Signed)
   01/01/20 2231  COVID-19 Daily Checkoff  Have you had a fever (temp > 37.80C/100F)  in the past 24 hours?  No  If you have had runny nose, nasal congestion, sneezing in the past 24 hours, has it worsened? No  COVID-19 EXPOSURE  Have you traveled outside the state in the past 14 days? No  Have you been in contact with someone with a confirmed diagnosis of COVID-19 or PUI in the past 14 days without wearing appropriate PPE? No  Have you been living in the same home as a person with confirmed diagnosis of COVID-19 or a PUI (household contact)? No  Have you been diagnosed with COVID-19? No

## 2020-01-01 NOTE — Progress Notes (Signed)
DAR NOTE: Pt present with flat affect and depressed mood in the unit. Pt has been isolating herself and has been bed most of the time. Pt woke up after evening med pass, was observed walking on the hall way with steady, pt kept asking for snack and Gatorade. Pt denies physical pain, took her meds as scheduled.  Pt's safety ensured with 15 minute and environmental checks. Pt currently denies SI/HI and A/V hallucinations. Will continue to monitor.

## 2020-01-01 NOTE — Progress Notes (Signed)
Recreation Therapy Notes  Date: 4.1.21 Time: 1000 Location: 500 Hall Dayroom  Group Topic: Self-Esteem  Goal Area(s) Addresses:  Patient will successfully identify positive attributes about themselves.  Patient will successfully identify benefit of improved self-esteem.   Behavioral Response: Engaged  Intervention: Engineer, civil (consulting), Holiday representative paper, glue sticks, scissors, music  Activity: Personalized Plates.  Patients were to create personalized license plates that highlight unique things about them such as accomplishments, important dates, special talents, etc.  Education:  Self-Esteem, Building control surveyor.   Education Outcome: Acknowledges education/In group clarification offered/Needs additional education  Clinical Observations/Feedback: Pt was active and focused on activity.  Pt stated she was born 02-15-1996, had two best friends in middle school named [place] and [place].  Pt also expressed roman noodles were her favorite food.  Pt also put three crosses on her plate to represent the "crucifixion of Jesus and the two other men with him".    Caroll Rancher, LRT/CTRS    Lillia Abed, [place] A 01/01/2020 11:10 AM

## 2020-01-01 NOTE — [facility] Suicide Risk Assessment (Signed)
[facility] INPATIENT:  Family/Significant Other Suicide Prevention Education  Suicide Prevention Education:  Patient Refusal for Family/Significant Other Suicide Prevention Education: The patient Lindsey Richmond has refused to provide written consent for family/significant other to be provided Family/Significant Other Suicide Prevention Education during admission and/or prior to discharge.  Physician notified.  Darreld Mclean 01/01/2020, 1:02 PM

## 2020-01-01 NOTE — Progress Notes (Signed)
[facility] Advocate MD Progress Note  01/01/2020 10:45 AM Lindsey Richmond  MRN:  [id number] Subjective:    Patient has had improvement she is certainly no longer catatonic and so forth her speech is soft and slow eye contact poor she is still a bit sluggish on the medication we will reduce the Risperdal to 3 mg at night and 4 mg at bedtime and avoid the daytime dosing by discharge Continue modafinil she is focused on discharge her mother believes she is improved but I think we have a minor med adjustment and will go tomorrow Principal Problem: Schizophrenia with recent catatonic features Diagnosis: Active Problems:   Schizophrenia (HCC)  Total Time spent with patient: 20 minutes  Past Psychiatric History: See eval  Past Medical History:  Past Medical History:  Diagnosis Date  . Schizo affective schizophrenia (HCC)    History reviewed. No pertinent surgical history. Family History: History reviewed. No pertinent family history. Family Psychiatric  History: see eval Social History:  Social History   Substance and Sexual Activity  Alcohol Use Never     Social History   Substance and Sexual Activity  Drug Use Never    Social History   Socioeconomic History  . Marital status: Single    Spouse name: Not on file  . Number of children: Not on file  . Years of education: Not on file  . Highest education level: Not on file  Occupational History  . Occupation: Unemployed  Tobacco Use  . Smoking status: Never Smoker  . Smokeless tobacco: Never Used  Substance and Sexual Activity  . Alcohol use: Never  . Drug use: Never  . Sexual activity: Never  Other Topics Concern  . Not on file  Social History Narrative   Pt lives with her mother, brother, and father.  She receives outpatient psychiatric care through [facility].   Social Determinants of Health   Financial Resource Strain:   . Difficulty of Paying Living Expenses:   Food Insecurity:   . Worried About Programme researcher, broadcasting/film/video in the Last Year:    . Barista in the Last Year:   Transportation Needs:   . Freight forwarder (Medical):   Marland Kitchen Lack of Transportation (Non-Medical):   Physical Activity:   . Days of Exercise per Week:   . Minutes of Exercise per Session:   Stress:   . Feeling of Stress :   Social Connections:   . Frequency of Communication with Friends and Family:   . Frequency of Social Gatherings with Friends and Family:   . Attends Religious Services:   . Active Member of Clubs or Organizations:   . Attends Banker Meetings:   Marland Kitchen Marital Status:    Additional Social History:                         Sleep: Fair  Appetite:  Fair  Current Medications: Current Facility-Administered Medications  Medication Dose Route Frequency Provider Last Rate Last Admin  . benztropine (COGENTIN) tablet 0.5 mg  0.5 mg Oral BID PRN Cobos, Rockey Situ, MD      . cloNIDine (CATAPRES) tablet 0.1 mg  0.1 mg Oral BID PRN Malvin Johns, MD      . LORazepam (ATIVAN) tablet 1 mg  1 mg Oral [id number] Antonieta Pert, MD   1 mg at 01/01/20 2595   Or  . LORazepam (ATIVAN) injection 1 mg  1 mg Intramuscular [id number] Antonieta Pert, MD  1 mg at 12/29/19 2121  . modafinil (PROVIGIL) tablet 200 mg  200 mg Oral Daily Johnn Hai, MD   200 mg at 01/01/20 0816  . OLANZapine zydis (ZYPREXA) disintegrating tablet 5 mg  5 mg Oral Q8H PRN Cobos, Myer Peer, MD      . Derrill Memo ON 01/02/2020] risperiDONE (RISPERDAL M-TABS) disintegrating tablet 3 mg  3 mg Oral QHS Johnn Hai, MD      . temazepam (RESTORIL) capsule 30 mg  30 mg Oral QHS Johnn Hai, MD   30 mg at 12/31/19 2121    Lab Results: No results found for this or any previous visit (from the past 23 hour(s)).  Blood Alcohol level:  Lab Results  Component Value Date   ETH <10 93/26/7124    Metabolic Disorder Labs: Lab Results  Component Value Date   HGBA1C 5.6 12/27/2019   MPG 114.02 12/27/2019   No results found for: PROLACTIN Lab Results   Component Value Date   CHOL 145 12/27/2019   TRIG 59 12/27/2019   HDL 24 (L) 12/27/2019   CHOLHDL 6.0 12/27/2019   VLDL 12 12/27/2019   LDLCALC 109 (H) 12/27/2019    Physical Findings: AIMS: Facial and Oral Movements Muscles of Facial Expression: None, normal Lips and Perioral Area: None, normal Jaw: None, normal Tongue: None, normal,Extremity Movements Upper (arms, wrists, hands, fingers): None, normal Lower (legs, knees, ankles, toes): None, normal, Trunk Movements Neck, shoulders, hips: None, normal, Overall Severity Severity of abnormal movements (highest score from questions above): None, normal Incapacitation due to abnormal movements: None, normal Patient's awareness of abnormal movements (rate only patient's report): No Awareness, Dental Status Current problems with teeth and/or dentures?: No Does patient usually wear dentures?: No  CIWA:    COWS:     Musculoskeletal: Strength & Muscle Tone: within normal limits Gait & Station: normal Patient leans: N/A  Psychiatric Specialty Exam: Physical Exam  Review of Systems  Blood pressure 104/63, pulse (!) 114, temperature (!) 97.3 F (36.3 C), temperature source Oral, resp. rate 16, height 5\' 4"  (1.626 m), weight 112.9 kg, last menstrual period 12/26/2019, SpO2 98 %.Body mass index is 42.74 kg/m.  General Appearance: Casual  Eye Contact:  Minimal  Speech:  Clear and Coherent  Volume:  Decreased  Mood:  Euthymic  Affect:  Flat  Thought Process:  Linear  Orientation:  Full (Time, Place, and Person)  Thought Content:  Rumination  Suicidal Thoughts:  No  Homicidal Thoughts:  No  Memory:  Immediate;   Fair Recent;   Fair  Judgement:  Fair  Insight:  Fair  Psychomotor Activity:  Decreased  Concentration:  Concentration: Fair and Attention Span: Fair  Recall:  [employer] of Knowledge:  Fair  Language:  Fair  Akathisia:  Negative  Handed:  Right  AIMS (if indicated):     Assets:  Physical Health Resilience   ADL's:  Intact  Cognition:  WNL  Sleep:  Number of Hours: 4.5     Treatment Plan Summary: Daily contact with patient to assess and evaluate symptoms and progress in treatment and Medication management  Cognitive therapy reality therapy no change in medications other than the alteration Risperdal tomorrow at bedtime at the point discharge.  Continue to monitor for safety Probable discharge tomorrow  Johnn Hai, MD 01/01/2020, 10:45 AM

## 2020-01-02 DIAGNOSIS — F200 Paranoid schizophrenia: Secondary | ICD-10-CM | POA: Diagnosis not present

## 2020-01-02 MED ORDER — RISPERIDONE 3 MG PO TABS: 3.0000 mg | ORAL_TABLET | Freq: Every day | ORAL | 2 refills | Status: DC

## 2020-01-02 MED ORDER — MODAFINIL 200 MG PO TABS: 200.0000 mg | ORAL_TABLET | Freq: Every day | ORAL | 0 refills | Status: DC

## 2020-01-02 MED ORDER — TRAZODONE HCL 150 MG PO TABS
150.0000 mg | ORAL_TABLET | Freq: Every day | ORAL | Status: DC
  Filled 2020-01-02: qty 7

## 2020-01-02 MED ORDER — BENZTROPINE MESYLATE 0.5 MG PO TABS: 0.5000 mg | ORAL_TABLET | Freq: Two times a day (BID) | ORAL | 2 refills | Status: DC | PRN

## 2020-01-02 MED ORDER — BENZTROPINE MESYLATE 0.5 MG PO TABS
0.5000 mg | ORAL_TABLET | Freq: Two times a day (BID) | ORAL | Status: DC
  Filled 2020-01-02 (×3): qty 14

## 2020-01-02 MED ORDER — RISPERIDONE 3 MG PO TABS
3.0000 mg | ORAL_TABLET | Freq: Every day | ORAL | Status: DC
  Filled 2020-01-02: qty 7

## 2020-01-02 MED ORDER — CLONIDINE HCL 0.1 MG PO TABS
0.1000 mg | ORAL_TABLET | Freq: Two times a day (BID) | ORAL | Status: DC
  Filled 2020-01-02 (×3): qty 14

## 2020-01-02 NOTE — [facility] Suicide Risk Assessment (Signed)
[facility] Discharge Suicide Risk Assessment   Principal Problem: Exacerbation of psychotic disorder Discharge Diagnoses: Active Problems:   Schizophrenia (HCC)   Total Time spent with patient: 45 minutes  Musculoskeletal: Strength & Muscle Tone: within normal limits Gait & Station: normal Patient leans: N/A  Psychiatric Specialty Exam: Review of Systems  Blood pressure (!) 94/57, pulse (!) 123, temperature 98.2 F (36.8 C), temperature source Oral, resp. rate 20, height 5\' 4"  (1.626 m), weight 112.9 kg, last menstrual period 12/26/2019, SpO2 100 %.Body mass index is 42.74 kg/m.  General Appearance: Casual  Eye Contact::  Good  Speech:  Clear and Coherent409  Volume:  low  Mood:  Euthymic  Affect:  Congruent  Thought Process:  Coherent and Goal Directed  Orientation:  Full (Time, Place, and Person)  Thought Content:  Logical  Suicidal Thoughts:  No  Homicidal Thoughts:  No  Memory:  Immediate;   Fair Recent;   Fair Remote;   Fair  Judgement:  Fair  Insight:  Fair  Psychomotor Activity:  Normal  Concentration:  Good  Recall:  Good  Fund of Knowledge:Good  Language: Good  Akathisia:  NA  Handed:  Right  AIMS (if indicated):     Assets:  Communication Skills Desire for Improvement  Sleep:  Number of Hours: 6.75  Cognition: WNL  ADL's:  Intact   Mental Status Per Nursing Assessment::   On Admission:  NA  Demographic Factors:  Unemployed  Loss Factors: Decrease in vocational status  Historical Factors: NA  Risk Reduction Factors:   Sense of responsibility to family and Religious beliefs about death  Continued Clinical Symptoms:  Previous Psychiatric Diagnoses and Treatments  Cognitive Features That Contribute To Risk:  Polarized thinking    Suicide Risk:  Minimal: No identifiable suicidal ideation.  Patients presenting with no risk factors but with morbid ruminations; may be classified as minimal risk based on the severity of the depressive  symptoms  Follow-up Information    [facility] Go on 01/20/2020.   Why: You have an appointment on 01/20/20 at 9:45 am for an injection.  You also have an appointment on the same day at 1:15 pm.  This appointment will be held in person. Contact information: [address] [place] [phone]           Plan Of Care/Follow-up recommendations:  Activity:  full  Maloree Uplinger, MD 01/02/2020, 8:20 AM

## 2020-01-02 NOTE — Progress Notes (Signed)
  [facility] Adult Case Management Discharge Plan :  Will you be returning to the same living situation after discharge:  Yes,  patient reports she is returning home At discharge, do you have transportation home?: Yes,  CSW arranging safe transport Do you have the ability to pay for your medications: No.  Release of information consent forms completed and in the chart;  Patient's signature needed at discharge.  Patient to Follow up at: Follow-up Information    [facility] Go on 01/20/2020.   Why: You have an appointment on 01/20/20 at 9:45 am for an injection.  You also have an appointment on the same day at 1:15 pm.  This appointment will be held in person. Please be sure to provide your discharge paperwork from this hospitalization.  Contact information: [address] [place] [address] [phone]           Next level of care provider has access to [facility] Link:yes  Safety Planning and Suicide Prevention discussed: Yes,  with the patient      Has patient been referred to the Quitline?: Patient refused referral  Patient has been referred for addiction treatment: N/A  Maeola Sarah, LCSWA 01/02/2020, 9:13 AM
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
## 2020-01-02 NOTE — Progress Notes (Signed)
Recreation Therapy Notes  INPATIENT RECREATION TR PLAN  Patient Details Name: Judye Lorino MRN: [id number] DOB: 06/06/1996 Today's Date: 01/02/2020  Rec Therapy Plan Is patient appropriate for Therapeutic Recreation?: Yes Treatment times per week: about 3 days Estimated Length of Stay: 5-7 days TR Treatment/Interventions: Group participation (Comment)  Discharge Criteria Pt will be discharged from therapy if:: Discharged Treatment plan/goals/alternatives discussed and agreed upon by:: Patient/family  Discharge Summary Short term goals set: See patient care plan Short term goals met: Complete Progress toward goals comments: Groups attended Which groups?: Self-esteem, Communication, Other (Comment)(Team building) Reason goals not met: None Therapeutic equipment acquired: N/A Reason patient discharged from therapy: Discharge from hospital Pt/family agrees with progress & goals achieved: Yes Date patient discharged from therapy: 01/02/20    Victorino Sparrow, LRT/CTRS   [name], Kwynn Schlotter A 01/02/2020, 11:34 AM

## 2020-01-02 NOTE — Progress Notes (Signed)
Pt present with jovial mood omn the unit. Pt stated she is going home today, but not sure how she is going to get. Pt took her bed time ativan PO but refused her RESTORIL stating she did not need. Pt maintained on Q 15 min obs, will continue to monitor.

## 2020-01-02 NOTE — Progress Notes (Signed)
Pt discharged to lobby. Pt was stable and appreciative at that time. All papers, samples and prescriptions were given and valuables returned. Verbal understanding expressed. Denies SI/HI and A/VH. Pt given opportunity to express concerns and ask questions.  

## 2020-01-02 NOTE — Plan of Care (Signed)
Pt was able to participate in groups with no impulsive behaviors at completion of recreation therapy group sessions.   Caroll Rancher, LRT/CTRS

## 2020-01-02 NOTE — Progress Notes (Signed)
Recreation Therapy Notes  Date: 4.2.21 Time: 0950 Location 500 Hall Dayroom   Group Topic: Communication, Team Building, Problem Solving  Goal Area(s) Addresses:  Patient will effectively work with peer towards shared goal.  Patient will identify skills used to make activity successful.  Patient will identify how skills used during activity can be used to reach post d/c goals.   Behavioral Response: None  Intervention: STEM Activity  Activity: Straw Bridge. In teams patients were given 15 plastic drinking straws and a length of masking tape. Using the materials provided patients were asked to build a free standing bridge that could hold a small puzzle box.  Education: Pharmacist, community, Discharge Planning   Education Outcome: Acknowledges education/In group clarification offered/Needs additional education.   Clinical Observations/Feedback: Pt did not participate in the activity but was appropriate in group.  Pt was bright and social with peers.  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    Caroll Rancher, LRT/CTRS     Lillia Abed, Shae Hinnenkamp A 01/02/2020 11:10 AM

## 2020-01-04 ENCOUNTER — Encounter (HOSPITAL_COMMUNITY): Payer: Self-pay | Admitting: Obstetrics and Gynecology

## 2020-01-04 ENCOUNTER — Other Ambulatory Visit: Payer: Self-pay

## 2020-01-04 ENCOUNTER — Emergency Department (HOSPITAL_COMMUNITY)
Admission: EM | Admit: 2020-01-04 | Discharge: 2020-01-05 | Disposition: A | Discharge status: Other Acute Inpatient Hospital | Payer: Medicaid Other | Attending: Emergency Medicine | Admitting: Emergency Medicine

## 2020-01-04 DIAGNOSIS — F200 Paranoid schizophrenia: Secondary | ICD-10-CM | POA: Diagnosis present

## 2020-01-04 DIAGNOSIS — F913 Oppositional defiant disorder: Secondary | ICD-10-CM | POA: Insufficient documentation

## 2020-01-04 DIAGNOSIS — Z79899 Other long term (current) drug therapy: Secondary | ICD-10-CM | POA: Insufficient documentation

## 2020-01-04 DIAGNOSIS — F209 Schizophrenia, unspecified: Secondary | ICD-10-CM | POA: Insufficient documentation

## 2020-01-04 DIAGNOSIS — F329 Major depressive disorder, single episode, unspecified: Secondary | ICD-10-CM | POA: Insufficient documentation

## 2020-01-04 DIAGNOSIS — Z20822 Contact with and (suspected) exposure to covid-19: Secondary | ICD-10-CM | POA: Insufficient documentation

## 2020-01-04 HISTORY — DX: Other symptoms and signs involving appearance and behavior: R46.89

## 2020-01-04 HISTORY — DX: Depression, unspecified: F32.A

## 2020-01-04 HISTORY — DX: Schizophrenia, unspecified: F20.9

## 2020-01-04 LAB — RESPIRATORY PANEL BY RT PCR (FLU A&B, COVID)
Influenza A by PCR: NEGATIVE
Influenza B by PCR: NEGATIVE
SARS Coronavirus 2 by RT PCR: NEGATIVE

## 2020-01-04 LAB — COMPREHENSIVE METABOLIC PANEL
ALT: 29 U/L (ref 0–44)
AST: 25 U/L (ref 15–41)
Albumin: 3.7 g/dL (ref 3.5–5.0)
Alkaline Phosphatase: 54 U/L (ref 38–126)
Anion gap: 10 (ref 5–15)
BUN: 11 mg/dL (ref 6–20)
CO2: 26 mmol/L (ref 22–32)
Calcium: 9.2 mg/dL (ref 8.9–10.3)
Chloride: 105 mmol/L (ref 98–111)
Creatinine, Ser: 0.87 mg/dL (ref 0.44–1.00)
GFR calc Af Amer: >60 mL/min (ref >60)
GFR calc non Af Amer: >60 mL/min (ref >60)
Glucose, Bld: 101 mg/dL — ABNORMAL HIGH (ref 70–99)
Potassium: 3.6 mmol/L (ref 3.5–5.1)
Sodium: 141 mmol/L (ref 135–145)
Total Bilirubin: 0.6 mg/dL (ref 0.3–1.2)
Total Protein: 8.2 g/dL — ABNORMAL HIGH (ref 6.5–8.1)

## 2020-01-04 LAB — CBC
HCT: 36.3 % (ref 36.0–46.0)
Hemoglobin: 11.8 g/dL — ABNORMAL LOW (ref 12.0–15.0)
MCH: 30.7 pg (ref 26.0–34.0)
MCHC: 32.5 g/dL (ref 30.0–36.0)
MCV: 94.5 fL (ref 80.0–100.0)
Platelets: 268 10*3/uL (ref 150–400)
RBC: 3.84 MIL/uL — ABNORMAL LOW (ref 3.87–5.11)
RDW: 13.1 % (ref 11.5–15.5)
WBC: 9.1 10*3/uL (ref 4.0–10.5)
nRBC: 0 % (ref 0.0–0.2)

## 2020-01-04 LAB — I-STAT BETA HCG BLOOD, ED (MC, WL, AP ONLY): I-stat hCG, quantitative: <5 m[IU]/mL (ref <5)

## 2020-01-04 LAB — SALICYLATE LEVEL: Salicylate Lvl: <7 mg/dL — ABNORMAL LOW (ref 7.0–30.0)

## 2020-01-04 LAB — ACETAMINOPHEN LEVEL: Acetaminophen (Tylenol), Serum: <10 ug/mL — ABNORMAL LOW (ref 10–30)

## 2020-01-04 LAB — ETHANOL: Alcohol, Ethyl (B): <10 mg/dL (ref <10)

## 2020-01-04 MED ORDER — LORAZEPAM 1 MG PO TABS
1.0000 mg | ORAL_TABLET | Freq: Once | ORAL | Status: DC
  Filled 2020-01-04: qty 1

## 2020-01-04 MED ORDER — MODAFINIL 200 MG PO TABS
200.0000 mg | ORAL_TABLET | Freq: Every day | ORAL | Status: DC
  Filled 2020-01-04: qty 1

## 2020-01-04 MED ORDER — RISPERIDONE 2 MG PO TABS
3.0000 mg | ORAL_TABLET | Freq: Every day | ORAL | Status: DC
  Administered 2020-01-04: 3 mg via ORAL
  Filled 2020-01-04: qty 1

## 2020-01-04 MED ORDER — CLONIDINE HCL 0.1 MG PO TABS
0.1000 mg | ORAL_TABLET | Freq: Two times a day (BID) | ORAL | Status: DC
  Administered 2020-01-04: 21:00:00 0.1 mg via ORAL
  Filled 2020-01-04 (×2): qty 1

## 2020-01-04 MED ORDER — TRAZODONE HCL 50 MG PO TABS
150.0000 mg | ORAL_TABLET | Freq: Every day | ORAL | Status: DC
  Administered 2020-01-04: 150 mg via ORAL
  Filled 2020-01-04: qty 1

## 2020-01-04 MED ORDER — BENZTROPINE MESYLATE 1 MG/ML IJ SOLN: 0.5000 mg | Freq: Two times a day (BID) | INTRAMUSCULAR | Status: DC | PRN
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
## 2020-01-04 NOTE — ED Triage Notes (Signed)
Per EMS: Patient reports to the ER for medical clearance BIB EMS. Patient reportedly has Schizophrenia and was released from [facility] x3 days ago. Patient reportedly has no one to take care of her. Dad is a IT trainer, mom has MS, and has no PCP. Patient was reportedly hanging onto a cross at a church when EMS was called

## 2020-01-04 NOTE — BH Assessment (Signed)
Tele Assessment Note   Patient Name: Lindsey Richmond MRN: [id number] Referring Physician: Davonna Belling, MD Location of Patient:  [facility] Location of Provider: [place]  Lindsey Richmond is an 24 y.o. female who presents to the ED voluntarily. Pt is slow to respond and does not make eye contact. Pt places the blanket over her head and does not respond to all direct questions. Pt has been admitted to multiple inpt facilities c/o Schizophrenia. Pt was most recently admitted to [facility] and d/c on 12/09/2019. Pt returns to the ED by EMS after being released from [facility] several days ago. Per triage, "was reportedly hanging onto a cross at a church." Pt denies SI and denies HI. Pt states "I was confessing to Christ." Pt only responds to some questions during the assessment and at other times she speaks soft and slow, inaudibly. Pt states "Jesus is coming." Pt states she lives with her mother and brother. TTS asked the pt for consent to contact her family or others in order to obtain collateral information and she declined to provide consent. Pt denies SA. Pt states she does not have a current [place] provider.   Pt is unresponsive during the assessment. Pt does not make eye contact. Pt's mood is labile and preoccupied with "Jesus coming back." Pt thought process is flight of ideas and thought blocking. Pt's judgement is impaired and pt is disoriented during the assessment.  Per Lindsey Sauger, NP pt meets criteria for inpt tx.  Diagnosis: Schizophrenia   Past Medical History:  Past Medical History:  Diagnosis Date  . Depression   . Oppositional defiant behavior   . Schizophrenia ([place])     History reviewed. No pertinent surgical history.  Family History:  Family History  Problem Relation Age of Onset  . Heart failure Mother   . Hypertension Mother   . Diabetes Father     Social History:  reports that she has never smoked. She has never used smokeless tobacco. She reports that  she does not drink alcohol or use drugs.  Additional Social History:  Alcohol / Drug Use Pain Medications: See MAR Prescriptions: See MAR Over the Counter: See MAR History of alcohol / drug use?: No history of alcohol / drug abuse  CIWA: CIWA-Ar BP: 136/72 Pulse Rate: 85 COWS:    Allergies: No Known Allergies  Home Medications: (Not in a hospital admission)   OB/GYN Status:  No LMP recorded. (Menstrual status: Other).  General Assessment Data Assessment unable to be completed: Yes Reason for not completing assessment: Pt closed eyes, feigned sleep(Refused to answer questions) [facility] Assessment: [facility] TTS Assessment: In system Is this a Tele or Face-to-Face Assessment?: Tele Assessment Is this an Initial Assessment or a Re-assessment for this encounter?: Initial Assessment Patient Accompanied by:: N/A Language Other than English: No Living Arrangements: Other (Comment) What gender do you identify as?: Female Marital status: Single Pregnancy Status: No Living Arrangements: Other relatives, Parent Can pt return to current living arrangement?: Yes Admission Status: Voluntary Is patient capable of signing voluntary admission?: Yes Referral Source: Self/Family/Friend Insurance type: MCD     Crisis Care Plan Living Arrangements: Other relatives, Parent Name of Psychiatrist: none Name of Therapist: none  Education Status Is patient currently in school?: No Is the patient employed, unemployed or receiving disability?: Unemployed  Risk to self with the past 6 months Suicidal Ideation: No Has patient been a risk to self within the past 6 months prior to admission? : No Suicidal Intent: No  Has patient had any suicidal intent within the past 6 months prior to admission? : No Is patient at risk for suicide?: No Suicidal Plan?: No Has patient had any suicidal plan within the past 6 months prior to admission? : No Access to Means: No What has been your use of  drugs/alcohol within the last 12 months?: pt denies Previous Attempts/Gestures: No Triggers for Past Attempts: None known Intentional Self Injurious Behavior: None Family Suicide History: No Recent stressful life event(s): Other (Comment)(delusions) Persecutory voices/beliefs?: Yes Depression: No Substance abuse history and/or treatment for substance abuse?: No Suicide prevention information given to non-admitted patients: Not applicable  Risk to Others within the past 6 months Homicidal Ideation: No Does patient have any lifetime risk of violence toward others beyond the six months prior to admission? : No Thoughts of Harm to Others: No Current Homicidal Intent: No Current Homicidal Plan: No Access to Homicidal Means: No History of harm to others?: No Assessment of Violence: None Noted Does patient have access to weapons?: No Criminal Charges Pending?: No Does patient have a court date: No Is patient on probation?: No  Psychosis Hallucinations: Auditory, Visual Delusions: Unspecified, Grandiose  Mental Status Report Appearance/Hygiene: Bizarre Eye Contact: Poor Motor Activity: Unsteady Speech: Soft, Slow Level of Consciousness: Quiet/awake Mood: Labile, Preoccupied Affect: Preoccupied, Flat, Constricted, Labile Anxiety Level: None Thought Processes: Flight of Ideas, Thought Blocking Judgement: Impaired Orientation: Not oriented Obsessive Compulsive Thoughts/Behaviors: None  Cognitive Functioning Concentration: Decreased Memory: Recent Impaired, Remote Impaired Is patient IDD: No Insight: Poor Impulse Control: Poor Appetite: Good Have you had any weight changes? : No Change Sleep: No Change Total Hours of Sleep: 8 Vegetative Symptoms: None  ADLScreening [facility] Assessment Services) Patient's cognitive ability adequate to safely complete daily activities?: Yes Patient able to express need for assistance with ADLs?: Yes Independently performs ADLs?: Yes  (appropriate for developmental age)  Prior Inpatient Therapy Prior Inpatient Therapy: Yes Prior Therapy Dates: 2021, 2020 and mult others Prior Therapy Facilty/Provider(s): [facility] Reason for Treatment: SCHIZO  Prior Outpatient Therapy Prior Outpatient Therapy: Loreli Slot)  ADL Screening (condition at time of admission) Patient's cognitive ability adequate to safely complete daily activities?: Yes Is the patient deaf or have difficulty hearing?: No Does the patient have difficulty seeing, even when wearing glasses/contacts?: No Does the patient have difficulty concentrating, remembering, or making decisions?: Yes Patient able to express need for assistance with ADLs?: Yes Does the patient have difficulty dressing or bathing?: No Independently performs ADLs?: Yes (appropriate for developmental age) Does the patient have difficulty walking or climbing stairs?: No Weakness of Legs: None Weakness of Arms/Hands: None  Home Assistive Devices/Equipment Home Assistive Devices/Equipment: None    Abuse/Neglect Assessment (Assessment to be complete while patient is alone) Abuse/Neglect Assessment Can Be Completed: Unable to assess, patient is non-responsive or altered mental status     Advance Directives (For Healthcare) Does Patient Have a Medical Advance Directive?: No Would patient like information on creating a medical advance directive?: No - Patient declined          Disposition:  Per Gillermo Murdoch, NP pt meets criteria for inpt tx.   Disposition Initial Assessment Completed for this Encounter: Yes Disposition of Patient: Admit Type of inpatient treatment program: Adult Patient refused recommended treatment: No  This service was provided via telemedicine using a 2-way, interactive audio and video technology.  Names of all persons participating in this telemedicine service and their role in this encounter. Name:  Lindsey Richmond Role: Patient  Name: Princess Bruins,  LCSW Role:  TTS  Name: Gillermo Murdoch, NP Role: [facility] Provider       [name] 01/04/2020 8:21 PM

## 2020-01-04 NOTE — Progress Notes (Signed)
Per Gillermo Murdoch, NP pt meets criteria for inpt tx. BH at capacity per [facility]. TTS to seek placement. EDP Benjiman Core, MD and Antionette Char, RN have been advised.

## 2020-01-05 ENCOUNTER — Encounter (HOSPITAL_COMMUNITY): Payer: Self-pay | Admitting: Registered Nurse

## 2020-01-05 ENCOUNTER — Encounter (HOSPITAL_COMMUNITY): Payer: Self-pay | Admitting: Psychiatry

## 2020-01-05 ENCOUNTER — Inpatient Hospital Stay (HOSPITAL_COMMUNITY)
Admission: AD | Admit: 2020-01-05 | Discharge: 2020-01-12 | DRG: 885 | Disposition: A | Discharge status: Home / Self Care | Payer: Medicaid Other | Source: Intra-hospital | Attending: Psychiatry | Admitting: Psychiatry

## 2020-01-05 ENCOUNTER — Other Ambulatory Visit: Payer: Self-pay

## 2020-01-05 DIAGNOSIS — F250 Schizoaffective disorder, bipolar type: Secondary | ICD-10-CM | POA: Diagnosis present

## 2020-01-05 DIAGNOSIS — Z8249 Family history of ischemic heart disease and other diseases of the circulatory system: Secondary | ICD-10-CM | POA: Diagnosis not present

## 2020-01-05 DIAGNOSIS — F913 Oppositional defiant disorder: Secondary | ICD-10-CM | POA: Diagnosis present

## 2020-01-05 DIAGNOSIS — R7303 Prediabetes: Secondary | ICD-10-CM | POA: Diagnosis present

## 2020-01-05 DIAGNOSIS — Z833 Family history of diabetes mellitus: Secondary | ICD-10-CM | POA: Diagnosis not present

## 2020-01-05 DIAGNOSIS — F209 Schizophrenia, unspecified: Secondary | ICD-10-CM | POA: Diagnosis present

## 2020-01-05 LAB — URINALYSIS, ROUTINE W REFLEX MICROSCOPIC
Bilirubin Urine: NEGATIVE
Glucose, UA: NEGATIVE mg/dL
Hgb urine dipstick: NEGATIVE
Ketones, ur: 5 mg/dL — AB
Leukocytes,Ua: NEGATIVE
Nitrite: NEGATIVE
Protein, ur: NEGATIVE mg/dL
Specific Gravity, Urine: 1.028 (ref 1.005–1.030)
pH: 6 (ref 5.0–8.0)

## 2020-01-05 LAB — RAPID URINE DRUG SCREEN, HOSP PERFORMED
Amphetamines: NOT DETECTED
Barbiturates: NOT DETECTED
Benzodiazepines: POSITIVE — AB
Cocaine: NOT DETECTED
Opiates: NOT DETECTED
Tetrahydrocannabinol: NOT DETECTED

## 2020-01-05 MED ORDER — TRAZODONE HCL 150 MG PO TABS
150.0000 mg | ORAL_TABLET | Freq: Every day | ORAL | Status: DC
  Administered 2020-01-06 – 2020-01-11 (×5): 150 mg via ORAL
  Filled 2020-01-05 (×11): qty 1

## 2020-01-05 MED ORDER — MODAFINIL 200 MG PO TABS
200.0000 mg | ORAL_TABLET | Freq: Every day | ORAL | Status: DC
  Administered 2020-01-06 – 2020-01-09 (×4): 200 mg via ORAL
  Filled 2020-01-05 (×4): qty 1

## 2020-01-05 MED ORDER — BENZTROPINE MESYLATE 1 MG/ML IJ SOLN: 0.5000 mg | Freq: Two times a day (BID) | INTRAMUSCULAR | Status: DC | PRN

## 2020-01-05 MED ORDER — RISPERIDONE 3 MG PO TABS
3.0000 mg | ORAL_TABLET | Freq: Every day | ORAL | Status: DC
  Filled 2020-01-05 (×3): qty 1

## 2020-01-05 MED ORDER — CLONIDINE HCL 0.1 MG PO TABS
0.1000 mg | ORAL_TABLET | Freq: Two times a day (BID) | ORAL | Status: DC
  Administered 2020-01-05 – 2020-01-06 (×3): 0.1 mg via ORAL
  Filled 2020-01-05 (×7): qty 1

## 2020-01-05 NOTE — Discharge Summary (Signed)
  Lindsey Richmond, 24 y.o., female patient seen via tele psych by this provider, Dr. Lucianne Muss; and chart reviewed on 01/05/20.  On evaluation Lindsey Richmond is elevated up in bed.  She states she was brought to the hospital because she was professing to [place].  During evaluation Lindsey Richmond is alert/oriented x 3; calm/cooperative.  He affect is flat.  Patient voice volume is whisper; Appears to be hyper religious with delusion; thoughts.    Recommended: Inpatient psychiatric treatment  Patient has been accepted to [facility]. Patient to be transported to [facility] after 1:00 PM

## 2020-01-05 NOTE — ED Notes (Signed)
Pt is refusing medication and refusing to eat or drink.  She is laying in bed with her eyes open stating that she is "fine"  She has not given urine sample yet.

## 2020-01-05 NOTE — BH Assessment (Addendum)
[facility] Assessment Progress Note  Per Shuvon Rankin, FNP, this pt requires psychiatric hospitalization at this time.  Jasmine has assigned pt to [facility] Rm 508-1; [facility] will be ready to receive pt after 13:00.  Pt presents voluntarily, but when this writer approached her to discuss consents, she would not respond, despite making eye contact.  Nelly Rout, MD finds that pt meets criteria for IVC, which she has initiated.  IVC documents have been faxed to [facility], and at 12:44 [employer] confirms receipt.  She has since faxed Findings and Custody Order to this Clinical research associate.  At 13:03 I called [employer] and spoke to [employer], who took demographic information, agreeing to dispatch law enforcement to fill out Return of Service.  As of this writing arrival of law enforcement is pending.  Affidavit and Petition for Involuntary Commitment has been faxed to [facility], along with [employer].  Pt's nurse, Kendal Hymen, has been notified, and to call report to [phone].  Pt is to be transported to [facility] by law enforcement after Findings and Custody Order is served.  Doylene Canning, [place] [facility] Coordinator [phone]

## 2020-01-06 ENCOUNTER — Encounter (HOSPITAL_COMMUNITY): Payer: Self-pay | Admitting: Psychiatry

## 2020-01-06 DIAGNOSIS — F250 Schizoaffective disorder, bipolar type: Principal | ICD-10-CM

## 2020-01-06 MED ORDER — RISPERIDONE 3 MG PO TABS
3.0000 mg | ORAL_TABLET | Freq: Two times a day (BID) | ORAL | Status: DC
  Administered 2020-01-06 – 2020-01-09 (×6): 3 mg via ORAL
  Filled 2020-01-06 (×11): qty 1

## 2020-01-06 MED ORDER — HALOPERIDOL LACTATE 5 MG/ML IJ SOLN: 5.0000 mg | Freq: Four times a day (QID) | INTRAMUSCULAR | Status: DC | PRN

## 2020-01-06 MED ORDER — LORAZEPAM 2 MG/ML IJ SOLN: 2.0000 mg | Freq: Four times a day (QID) | INTRAMUSCULAR | Status: DC

## 2020-01-06 MED ORDER — LORAZEPAM 0.5 MG PO TABS
0.5000 mg | ORAL_TABLET | Freq: Two times a day (BID) | ORAL | Status: DC
  Administered 2020-01-06 – 2020-01-09 (×5): 0.5 mg via ORAL
  Filled 2020-01-06 (×5): qty 1

## 2020-01-06 MED ORDER — HALOPERIDOL 5 MG PO TABS: 5.0000 mg | ORAL_TABLET | Freq: Three times a day (TID) | ORAL | Status: DC | PRN

## 2020-01-06 MED ORDER — LORAZEPAM 1 MG PO TABS
1.0000 mg | ORAL_TABLET | Freq: Four times a day (QID) | ORAL | Status: DC
  Administered 2020-01-06 – 2020-01-07 (×4): 1 mg via ORAL
  Filled 2020-01-06 (×3): qty 1
  Filled 2020-01-06: qty 2
  Filled 2020-01-06: qty 1

## 2020-01-06 NOTE — [facility] Suicide Risk Assessment (Signed)
[facility] INPATIENT:  Family/Significant Other Suicide Prevention Education  Suicide Prevention Education:  Patient Refusal for Family/Significant Other Suicide Prevention Education: The patient Lindsey Richmond has refused to provide written consent for family/significant other to be provided Family/Significant Other Suicide Prevention Education during admission and/or prior to discharge.  Physician notified.  Darreld Mclean 01/06/2020, 10:37 AM

## 2020-01-06 NOTE — H&P (Signed)
Psychiatric Admission Assessment Adult  Patient Identification: Lindsey Richmond MRN:  [id number] Date of Evaluation:  01/06/2020 Chief Complaint:  "I was professing Jesus Christ." Principal Diagnosis: <principal problem not specified> Diagnosis:  Active Problems:   Schizophrenia (HCC)  History of Present Illness: Lindsey Richmond is a 24 year old female with history of schizophrenia. She was recently discharged from our unit on 12/25/19 after admission for similar presentation. She was discharged on Invega Sustenna, Risperdal 3 mg QHS, Cogentin 0.5 mg BID, Provigil 200 mg daily, and trazodone 150 mg QHS. She had an admission here 12/03/19-12/09/19 as well. She returned to the ED on 01/04/20, brought in by EMS after being found hanging onto a cross at a church. Patient received Gean Birchwood 234 mg during recent admission but admits that she was not taking oral medications since discharge. She is hyperreligious on assessment, stating that she was brought to the hospital because "I believe in the truth because I've been called and I've been reading the Bible." She states she is spreading the truth of Jesus Christ. She states that her brother wanted her to come back to the hospital. She is intermittently mute with soft speech, no eye contact, and difficult to hear at times. She presents with flat affect and provides vague answers, poverty of content, and appears to have some thought blocking. She denies SI/HI/AVH and shows no signs of responding to internal stimuli. Per nursing report she has been speaking to herself alone in her room. Per brother's report, patient has been violent and angry at home. She has been isolating to her room since admission yesterday.  Associated Signs/Symptoms: Depression Symptoms:  denies (Hypo) Manic Symptoms:  Delusions, Labiality of Mood, Anxiety Symptoms:  denies Psychotic Symptoms:  Delusions, PTSD Symptoms: NA Total Time spent with patient: 30 minutes  Past Psychiatric History:  History of schizophrenia with multiple hospitalizations. Recently admitted to [facility] for similar presentation and discharged 12/25/19 on Invega Sustenna, Risperdal 3 mg QHS, Cogentin 0.5 mg BID, Provigil 200 mg daily, and trazodone 150 mg QHS. She had an admission here 12/03/19-12/09/19 as well.  Is the patient at risk to self? Yes.    Has the patient been a risk to self in the past 6 months? Yes.    Has the patient been a risk to self within the distant past? Yes.    Is the patient a risk to others? Yes.    Has the patient been a risk to others in the past 6 months? Yes.    Has the patient been a risk to others within the distant past? No.   Prior Inpatient Therapy:   Prior Outpatient Therapy:    Alcohol Screening: 1. How often do you have a drink containing alcohol?: Never 2. How many drinks containing alcohol do you have on a typical day when you are drinking?: 1 or 2 3. How often do you have six or more drinks on one occasion?: Never AUDIT-C Score: 0 4. How often during the last year have you found that you were not able to stop drinking once you had started?: Never 5. How often during the last year have you failed to do what was normally expected from you becasue of drinking?: Never 6. How often during the last year have you needed a first drink in the morning to get yourself going after a heavy drinking session?: Never 7. How often during the last year have you had a feeling of guilt of remorse after drinking?: Never 8. How often during the last  year have you been unable to remember what happened the night before because you had been drinking?: Never 9. Have you or someone else been injured as a result of your drinking?: No 10. Has a relative or friend or a doctor or another health worker been concerned about your drinking or suggested you cut down?: No Alcohol Use Disorder Identification Test Final Score (AUDIT): 0 Substance Abuse History in the last 12 months:  No. Consequences of Substance  Abuse: NA Previous Psychotropic Medications: Yes  Psychological Evaluations: No  Past Medical History:  Past Medical History:  Diagnosis Date  . Depression   . Oppositional defiant behavior   . Schizo affective schizophrenia (HCC)   . Schizophrenia (HCC)    History reviewed. No pertinent surgical history. Family History:  Family History  Problem Relation Age of Onset  . Heart failure Mother   . Hypertension Mother   . Diabetes Father    Family Psychiatric  History: Denies Tobacco Screening:   Social History:  Social History   Substance and Sexual Activity  Alcohol Use No     Social History   Substance and Sexual Activity  Drug Use No    Additional Social History:                           Allergies:  No Known Allergies Lab Results:  Results for orders placed or performed during the hospital encounter of 01/04/20 (from the past 48 hour(s))  Comprehensive metabolic panel     Status: Abnormal   Collection Time: 01/04/20  3:59 PM  Result Value Ref Range   Sodium 141 135 - 145 mmol/L   Potassium 3.6 3.5 - 5.1 mmol/L   Chloride 105 98 - 111 mmol/L   CO2 26 22 - 32 mmol/L   Glucose, Bld 101 (H) 70 - 99 mg/dL    Comment: Glucose reference range applies only to samples taken after fasting for at least 8 hours.   BUN 11 6 - 20 mg/dL   Creatinine, Ser 1.61 0.44 - 1.00 mg/dL   Calcium 9.2 8.9 - 09.6 mg/dL   Total Protein 8.2 (H) 6.5 - 8.1 g/dL   Albumin 3.7 3.5 - 5.0 g/dL   AST 25 15 - 41 U/L   ALT 29 0 - 44 U/L   Alkaline Phosphatase 54 38 - 126 U/L   Total Bilirubin 0.6 0.3 - 1.2 mg/dL   GFR calc non Af Amer >60 >60 mL/min   GFR calc Af Amer >60 >60 mL/min   Anion gap 10 5 - 15    Comment: Performed at [facility], [address], [place], [place] [address]  Ethanol     Status: None   Collection Time: 01/04/20  3:59 PM  Result Value Ref Range   Alcohol, Ethyl (B) <10 <10 mg/dL    Comment: (NOTE) Lowest detectable limit for serum  alcohol is 10 mg/dL. For medical purposes only. Performed at [facility], [address] [place], [place] [address]   Salicylate level     Status: Abnormal   Collection Time: 01/04/20  3:59 PM  Result Value Ref Range   Salicylate Lvl <7.0 (L) 7.0 - 30.0 mg/dL    Comment: Performed at [facility], [address] [place], [place] [address]  Acetaminophen level     Status: Abnormal   Collection Time: 01/04/20  3:59 PM  Result Value Ref Range   Acetaminophen (Tylenol), Serum <  10 (L) 10 - 30 ug/mL    Comment: (NOTE) Therapeutic concentrations vary significantly. A range of 10-30 ug/mL  may be an effective concentration for many patients. However, some  are best treated at concentrations outside of this range. Acetaminophen concentrations >150 ug/mL at 4 hours after ingestion  and >50 ug/mL at 12 hours after ingestion are often associated with  toxic reactions. Performed at [facility], [address] [place], [place] [address]   cbc     Status: Abnormal   Collection Time: 01/04/20  3:59 PM  Result Value Ref Range   WBC 9.1 4.0 - 10.5 K/uL   RBC 3.84 (L) 3.87 - 5.11 MIL/uL   Hemoglobin 11.8 (L) 12.0 - 15.0 g/dL   HCT 41.7 40.8 - 14.4 %   MCV 94.5 80.0 - 100.0 fL   MCH 30.7 26.0 - 34.0 pg   MCHC 32.5 30.0 - 36.0 g/dL   RDW 81.8 56.3 - 14.9 %   Platelets 268 150 - 400 K/uL   nRBC 0.0 0.0 - 0.2 %    Comment: Performed at [facility], [address] [place], [place] [address]  Respiratory Panel by RT PCR (Flu A&B, Covid) - Nasopharyngeal Swab     Status: None   Collection Time: 01/04/20  4:09 PM   Specimen: Nasopharyngeal Swab  Result Value Ref Range   SARS Coronavirus 2 by RT PCR NEGATIVE NEGATIVE    Comment: (NOTE) SARS-CoV-2 target nucleic acids are NOT DETECTED. The SARS-CoV-2 RNA is generally detectable in upper respiratoy specimens during the acute phase of infection. The  lowest concentration of SARS-CoV-2 viral copies this assay can detect is 131 copies/mL. A negative result does not preclude SARS-Cov-2 infection and should not be used as the sole basis for treatment or other patient management decisions. A negative result may occur with  improper specimen collection/handling, submission of specimen other than nasopharyngeal swab, presence of viral mutation(s) within the areas targeted by this assay, and inadequate number of viral copies (<131 copies/mL). A negative result must be combined with clinical observations, patient history, and epidemiological information. The expected result is Negative. Fact Sheet for Patients:  https://www.moore.com/ Fact Sheet for Healthcare Providers:  https://www.young.biz/ This test is not yet ap proved or cleared by the [place] FDA and  has been authorized for detection and/or diagnosis of SARS-CoV-2 by FDA under an Emergency Use Authorization (EUA). This EUA will remain  in effect (meaning this test can be used) for the duration of the COVID-19 declaration under Section 564(b)(1) of the Act, 21 U.S.C. section 360bbb-3(b)(1), unless the authorization is terminated or revoked sooner.    Influenza A by PCR NEGATIVE NEGATIVE   Influenza B by PCR NEGATIVE NEGATIVE    Comment: (NOTE) The Xpert Xpress SARS-CoV-2/FLU/RSV assay is intended as an aid in  the diagnosis of influenza from Nasopharyngeal swab specimens and  should not be used as a sole basis for treatment. Nasal washings and  aspirates are unacceptable for Xpert Xpress SARS-CoV-2/FLU/RSV  testing. Fact Sheet for Patients: https://www.moore.com/ Fact Sheet for Healthcare Providers: https://www.young.biz/ This test is not yet approved or cleared by the [place] FDA and  has been authorized for detection and/or diagnosis of SARS-CoV-2 by  FDA under an Emergency Use  Authorization (EUA). This EUA will remain  in effect (meaning this test can be used) for the duration of the  Covid-19 declaration under Section 564(b)(1) of the Act, 21  U.S.C. section 360bbb-3(b)(1), unless the authorization is  terminated or revoked. Performed at [facility], [address] [place], [place] [address]   I-Stat beta hCG blood, ED     Status: None   Collection Time: 01/04/20  4:43 PM  Result Value Ref Range   I-stat hCG, quantitative <5.0 <5 mIU/mL   Comment 3            Comment:   GEST. AGE      CONC.  (mIU/mL)   <=1 WEEK        5 - 50     2 WEEKS       50 - 500     3 WEEKS       100 - 10,000     4 WEEKS     1,000 - 30,000        FEMALE AND NON-PREGNANT FEMALE:     LESS THAN 5 mIU/mL   Rapid urine drug screen (hospital performed)     Status: Abnormal   Collection Time: 01/05/20  1:57 PM  Result Value Ref Range   Opiates NONE DETECTED NONE DETECTED   Cocaine NONE DETECTED NONE DETECTED   Benzodiazepines POSITIVE (A) NONE DETECTED   Amphetamines NONE DETECTED NONE DETECTED   Tetrahydrocannabinol NONE DETECTED NONE DETECTED   Barbiturates NONE DETECTED NONE DETECTED    Comment: (NOTE) DRUG SCREEN FOR MEDICAL PURPOSES ONLY.  IF CONFIRMATION IS NEEDED FOR ANY PURPOSE, NOTIFY LAB WITHIN 5 DAYS. LOWEST DETECTABLE LIMITS FOR URINE DRUG SCREEN Drug Class                     Cutoff (ng/mL) Amphetamine and metabolites    1000 Barbiturate and metabolites    200 Benzodiazepine                 200 Tricyclics and metabolites     300 Opiates and metabolites        300 Cocaine and metabolites        300 THC                            50 Performed at [facility], [address] [place], [place] [address]   Urinalysis, Routine w reflex microscopic     Status: Abnormal   Collection Time: 01/05/20  1:57 PM  Result Value Ref Range   Color, Urine YELLOW YELLOW   APPearance CLEAR CLEAR   Specific Gravity, Urine 1.028 1.005 -  1.030   pH 6.0 5.0 - 8.0   Glucose, UA NEGATIVE NEGATIVE mg/dL   Hgb urine dipstick NEGATIVE NEGATIVE   Bilirubin Urine NEGATIVE NEGATIVE   Ketones, ur 5 (A) NEGATIVE mg/dL   Protein, ur NEGATIVE NEGATIVE mg/dL   Nitrite NEGATIVE NEGATIVE   Leukocytes,Ua NEGATIVE NEGATIVE    Comment: Performed at [facility], [address] [place], [place] [address]    Blood Alcohol level:  Lab Results  Component Value Date   [facility] 01/04[facility]2020   ETH <10 12/24/2019    Metabolic Disorder Labs:  Lab Results  Component Value Date   HGBA1C 5.6 12/27/2019   MPG 114.02 12/27/2019   MPG 105.41 12/04/2019   Lab Results  Component Value Date   PROLACTIN 57.7 (H) 12/04/2019   PROLACTIN 75.7 (H) 10/23/2019   Lab Results  Component Value Date   CHOL 145 12/27/2019   TRIG 59 12/27/2019   HDL 24 (L) 12/27/2019   CHOLHDL 6.0 12/27/2019  VLDL 12 12/27/2019   LDLCALC 109 (H) 12/27/2019   LDLCALC 100 (H) 12/04/2019    Current Medications: Current Facility-Administered Medications  Medication Dose Route Frequency Provider Last Rate Last Admin  . benztropine mesylate (COGENTIN) injection 0.5 mg  0.5 mg Intramuscular BID PRN Rankin, Shuvon B, NP      . cloNIDine (CATAPRES) tablet 0.1 mg  0.1 mg Oral BID Rankin, Shuvon B, NP   0.1 mg at 01/06/20 0816  . modafinil (PROVIGIL) tablet 200 mg  200 mg Oral Daily Rankin, Shuvon B, NP   200 mg at 01/06/20 0816  . risperiDONE (RISPERDAL) tablet 3 mg  3 mg Oral QHS Rankin, Shuvon B, NP      . traZODone (DESYREL) tablet 150 mg  150 mg Oral QHS Rankin, Shuvon B, NP       PTA Medications: Medications Prior to Admission  Medication Sig Dispense Refill Last Dose  . benztropine (COGENTIN) 0.5 MG tablet Take 1 tablet (0.5 mg total) by mouth 2 (two) times daily. (Patient not taking: Reported on 01/05/2020) 180 tablet 1   . benztropine (COGENTIN) 0.5 MG tablet Take 1 tablet (0.5 mg total) by mouth 2 (two) times daily as needed for tremors (EPS). 60  tablet 2   . cloNIDine (CATAPRES) 0.1 MG tablet Take 1 tablet (0.1 mg total) by mouth 2 (two) times daily. (Patient not taking: Reported on 01/05/2020) 60 tablet 2   . cloNIDine (CATAPRES) 0.1 MG tablet Take 0.1 mg by mouth 2 (two) times daily.     . modafinil (PROVIGIL) 200 MG tablet Take 1 tablet (200 mg total) by mouth daily. 7 tablet 0   . paliperidone (INVEGA SUSTENNA) 156 MG/ML SUSY injection Inject 156 mg into the muscle every 30 (thirty) days.     . risperiDONE (RISPERDAL) 3 MG tablet Take 1 tablet (3 mg total) by mouth at bedtime. 30 tablet 2   . traZODone (DESYREL) 150 MG tablet Take 1 tablet (150 mg total) by mouth at bedtime as needed for sleep. (Patient not taking: Reported on 01/05/2020) 90 tablet 1   . traZODone (DESYREL) 150 MG tablet Take 150 mg by mouth at bedtime.       Musculoskeletal: Strength & Muscle Tone: within normal limits Gait & Station: normal Patient leans: N/A  Psychiatric Specialty Exam: Physical Exam  Nursing note and vitals reviewed. Constitutional: She is oriented to person, place, and time. She appears well-developed and well-nourished.  Cardiovascular: Normal rate.  Respiratory: Effort normal.  Neurological: She is alert and oriented to person, place, and time.    Review of Systems  Constitutional: Negative.   Respiratory: Negative for cough and shortness of breath.   Gastrointestinal: Negative for nausea and vomiting.  Psychiatric/Behavioral: Positive for hallucinations. Negative for agitation, behavioral problems, dysphoric mood, self-injury, sleep disturbance and suicidal ideas. The patient is not nervous/anxious and is not hyperactive.     Blood pressure 101/65, pulse 90, temperature 98.3 F (36.8 C), temperature source Oral, resp. rate 18, height 5\' 4"  (1.626 m), weight 115.2 kg, last menstrual period 12/26/2019, SpO2 98 %.Body mass index is 43.6 kg/m.  General Appearance: Fairly Groomed  Eye Contact:  Minimal  Speech:  Slow  Volume:  Decreased   Mood:  Euthymic  Affect:  Flat  Thought Process:  Descriptions of Associations: Tangential  Orientation:  Full (Time, Place, and Person)  Thought Content:  Delusions  Suicidal Thoughts:  No  Homicidal Thoughts:  No  Memory:  Immediate;   Fair Recent;   Poor  Remote;   Poor  Judgement:  Impaired  Insight:  Lacking  Psychomotor Activity:  Decreased  Concentration:  Concentration: Fair and Attention Span: Poor  Recall:  [employer] of Knowledge:  Fair  Language:  Fair  Akathisia:  No  Handed:  Right  AIMS (if indicated):     Assets:  Housing Leisure Time Resilience Social Support  ADL's:  Intact  Cognition:  WNL  Sleep:  Number of Hours: 6.75    Treatment Plan Summary: Daily contact with patient to assess and evaluate symptoms and progress in treatment and Medication management   Inpatient hospitalization.  Resume Risperdal 3 mg PO QHS for psychosis, Provigil 200 mg PO daily for negative symptoms, clonidine 0.1 mg PO BID for HTN, and trazodone 150 mg PO QHS for insomnia.  Patient will participate in the therapeutic group milieu.  Discharge disposition in progress.   Observation Level/Precautions:  15 minute checks  Laboratory:  Reviewed  Psychotherapy:  Group therapy  Medications:  See MAR  Consultations:  PRN  Discharge Concerns:  Safety and stabilization  Estimated LOS: 3-5 days  Other:     Physician Treatment Plan for Primary Diagnosis: <principal problem not specified> Long Term Goal(s): Improvement in symptoms so as ready for discharge  Short Term Goals: Ability to identify changes in lifestyle to reduce recurrence of condition will improve, Ability to verbalize feelings will improve and Ability to disclose and discuss suicidal ideas  Physician Treatment Plan for Secondary Diagnosis: Active Problems:   Schizophrenia (HCC)  Long Term Goal(s): Improvement in symptoms so as ready for discharge  Short Term Goals: Ability to demonstrate self-control will  improve and Ability to identify and develop effective coping behaviors will improve  I certify that inpatient services furnished can reasonably be expected to improve the patient's condition.    Aldean Baker, NP 4/6/20219:07 AM

## 2020-01-06 NOTE — BHH Counselor (Signed)
CSW spoke with patient's [facility], Dois Davenport [phone]). CSW reviewed plan for outpatient follow up and contact information with Care Coordinator.  Enid Cutter, MSW, LCSW-A Clinical Social Worker [facility] Adult Unit

## 2020-01-06 NOTE — BHH Suicide Risk Assessment (Signed)
[facility] Admission Suicide Risk Assessment   Nursing information obtained from:  Patient Demographic factors:  Unemployed, Adolescent or young adult, Lindsey Richmond, lesbian, or bisexual orientation Current Mental Status:  NA Loss Factors:  NA Historical Factors:  Prior suicide attempts, Impulsivity Risk Reduction Factors:  Living with another person, especially a relative  Total Time spent with patient: 45 minutes Principal Problem: Presumed noncompliance exacerbation of schizoaffective disorder involving psychosis Diagnosis:  Active Problems:   Schizophrenia (HCC)   Schizoaffective disorder, bipolar type (HCC)  Subjective Data: Patient once again presents in a psychotic state  Continued Clinical Symptoms:  Alcohol Use Disorder Identification Test Final Score (AUDIT): 0 The "Alcohol Use Disorders Identification Test", Guidelines for Use in Primary Care, Second Edition.  World Science writer [facility]). Score between 0-7:  no or low risk or alcohol related problems. Score between 8-15:  moderate risk of alcohol related problems. Score between 16-19:  high risk of alcohol related problems. Score 20 or above:  warrants further diagnostic evaluation for alcohol dependence and treatment.   CLINICAL FACTORS:   Currently Psychotic   Musculoskeletal: Strength & Muscle Tone: within normal limits Gait & Station: normal Patient leans: N/A  Psychiatric Specialty Exam: Physical Exam  Review of Systems  Blood pressure 101/65, pulse 90, temperature 98.3 F (36.8 C), temperature source Oral, resp. rate 18, height 5\' 4"  (1.626 m), weight 115.2 kg, last menstrual period 12/26/2019, SpO2 98 %.Body mass index is 43.6 kg/m.  General Appearance: Casual  Eye Contact:  Good  Speech:  Slow  Volume:  Decreased  Mood:  Dysphoric  Affect:  Blunt and Constricted  Thought Process:  Irrelevant  Orientation:  Full (Time, Place, and Person)  Thought Content:  Illogical and Delusions  Suicidal Thoughts:  No   Homicidal Thoughts:  No  Memory:  Recent;   Poor Remote;   Fair  Judgement:  Fair  Insight:  fair  Psychomotor Activity:  Decreased  Concentration:  Concentration: Poor and Attention Span: Poor  Recall:  Poor  Fund of Knowledge:  Poor  Language:  Poor  Akathisia:  Negative  Handed:  Right  AIMS (if indicated):     Assets:  Communication Skills Intimacy Leisure Time  ADL's:  Intact  Cognition:  WNL  Sleep:  Number of Hours: 6.75      COGNITIVE FEATURES THAT CONTRIBUTE TO RISK:  Loss of executive function    SUICIDE RISK:   Minimal: No identifiable suicidal ideation.  Patients presenting with no risk factors but with morbid ruminations; may be classified as minimal risk based on the severity of the depressive symptoms  PLAN OF CARE: see eval  I certify that inpatient services furnished can reasonably be expected to improve the patient's condition.   12/28/2019, MD 01/06/2020, 10:32 AM

## 2020-01-06 NOTE — Progress Notes (Signed)
Recreation Therapy Notes  Patient admitted to unit 4.5.21. Due to admission within last year, no new assessment conducted at this time. Last assessment conducted 3.30.21. Patient reports having delusions as current stressor.  Patient stated reason for admission was "my parents stated I was hyper-religious".  Patient identified coping skills as isolation, internalize things, art, prayer, avoidance, music and exercise.  Patient stated leisure interests were walking, running and prayer.  Community resources were identified as park, [employer], which patient stated she doesn't use often.  Patient identified strength as liking people and area of improvement as communication skills.     Patient denies SI, HI and AVH at this time. Patient reports goal of "work on improving myself and get book and novel published".    Caroll Rancher, LRT/CTRS   Caroll Rancher A 01/06/2020 11:36 AM

## 2020-01-06 NOTE — Progress Notes (Signed)
Pt has been isolative, been talking to herself in the room. Pt refused her evening medications stating she did not need them. Pt has been in bed, problems noted, will continue to monitor.

## 2020-01-06 NOTE — Progress Notes (Signed)
[facility] is unable to accept patient for ACTT services as patient does not have [facility].  CSW continuing to assess for appropriate referrals.  Enid Cutter, MSW, LCSW-A Clinical Social Worker [facility] Adult Unit

## 2020-01-06 NOTE — Progress Notes (Signed)
Pt up and seen at med window. Speech soft but understandable. Pt took medication without complication. Pt denies SI, HI, AVH and pain.

## 2020-01-06 NOTE — Progress Notes (Addendum)
   01/06/20 2022  Psych Admission Type (Psych Patients Only)  Admission Status Voluntary  Psychosocial Assessment  Patient Complaints None  Eye Contact Avoids  Facial Expression Sad  Affect Flat  Speech UTA (pt not speaking)  Interaction Cautious;Guarded;Minimal;Isolative  Motor Activity Slow  Appearance/Hygiene Unremarkable;In scrubs  Behavior Characteristics Other (Comment) (staring out window)  Mood Sad  Thought Process  Coherency Unable to assess  Content UTA  Delusions UTA  Perception UTA  Hallucination UTA  Judgment UTA  Confusion UTA  Danger to Self  Current suicidal ideation?  (does not answer questions)  Danger to Others  Danger to Others None reported or observed   Pt observed staring out the bedroom window. Pt not communicating when questions asked.

## 2020-01-06 NOTE — Progress Notes (Signed)
   01/06/20 1000  Psych Admission Type (Psych Patients Only)  Admission Status Voluntary  Psychosocial Assessment  Patient Complaints Depression  Eye Contact Fair  Facial Expression Sullen;Sad  Affect Flat  Speech Soft;Slow  Interaction Cautious;Guarded;Minimal;Isolative  Motor Activity Slow  Appearance/Hygiene Bizarre  Behavior Characteristics Appropriate to situation  Mood Depressed  Thought Process  Coherency Unable to assess  Content Religiosity  Delusions Paranoid  Perception Hallucinations  Hallucination Auditory  Judgment Poor  Confusion None  Danger to Self  Current suicidal ideation? Denies  Self-Injurious Behavior No self-injurious ideation or behavior indicators observed or expressed   Agreement Not to Harm Self Yes  Description of Agreement Verbally contracts for safety  Danger to Others  Danger to Others None reported or observed

## 2020-01-06 NOTE — Progress Notes (Signed)
Recreation Therapy Notes  Date: 4.6.21 Time: 1000 Location: 500 Hall Dayroom  Group Topic: Wellness  Goal Area(s) Addresses:  Patient will define components of whole wellness. Patient will verbalize benefit of whole wellness.  Intervention: Music  Activity:  Exercise.  LRT led group in a series of stretches.  Patients then took turns leading group in exercises of their choosing.  Patients were encouraged to take breaks when needed and drink water as needed.      Education: Wellness, Building control surveyor.   Education Outcome: Acknowledges education/In group clarification offered/Needs additional education.   Clinical Observations/Feedback:  Pt did not attend group.       Caroll Rancher, LRT/CTRS         Caroll Rancher A 01/06/2020 11:35 AM
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
## 2020-01-07 NOTE — Progress Notes (Signed)
[facility] MD Progress Note  01/07/2020 8:51 AM Lindsey Richmond  MRN:  [id number] Subjective:   Patient seen she remains a bit guarded a bit sluggish denies current auditory or visual hallucinations denies current thoughts of harming self.  No EPS or TD, showing some response to medication requests no changes reports sleep is fair Principal Problem: Relapse regarding schizoaffective condition Diagnosis: Active Problems:   Schizophrenia (HCC)   Schizoaffective disorder, bipolar type (HCC)  Total Time spent with patient: 20 minutes  Past Psychiatric History: see eval  Past Medical History:  Past Medical History:  Diagnosis Date  . Depression   . Oppositional defiant behavior   . Schizo affective schizophrenia (HCC)   . Schizophrenia (HCC)    History reviewed. No pertinent surgical history. Family History:  Family History  Problem Relation Age of Onset  . Heart failure Mother   . Hypertension Mother   . Diabetes Father    Family Psychiatric  History: see eval Social History:  Social History   Substance and Sexual Activity  Alcohol Use No     Social History   Substance and Sexual Activity  Drug Use No    Social History   Socioeconomic History  . Marital status: Single    Spouse name: Not on file  . Number of children: Not on file  . Years of education: Not on file  . Highest education level: Not on file  Occupational History  . Occupation: Unemployed  Tobacco Use  . Smoking status: Never Smoker  . Smokeless tobacco: Never Used  Substance and Sexual Activity  . Alcohol use: No  . Drug use: No  . Sexual activity: Never    Birth control/protection: None  Other Topics Concern  . Not on file  Social History Narrative   ** Merged History Encounter **       Pt lives with her mother, brother, and father.  She receives outpatient psychiatric care through [facility].   Social Determinants of Health   Financial Resource Strain:   . Difficulty of Paying Living Expenses:   Food  Insecurity:   . Worried About Programme researcher, broadcasting/film/video in the Last Year:   . Barista in the Last Year:   Transportation Needs:   . Freight forwarder (Medical):   Marland Kitchen Lack of Transportation (Non-Medical):   Physical Activity:   . Days of Exercise per Week:   . Minutes of Exercise per Session:   Stress:   . Feeling of Stress :   Social Connections:   . Frequency of Communication with Friends and Family:   . Frequency of Social Gatherings with Friends and Family:   . Attends Religious Services:   . Active Member of Clubs or Organizations:   . Attends Banker Meetings:   Marland Kitchen Marital Status:    Additional Social History:                         Sleep: Fair  Appetite:  Fair  Current Medications: Current Facility-Administered Medications  Medication Dose Route Frequency Provider Last Rate Last Admin  . benztropine mesylate (COGENTIN) injection 0.5 mg  0.5 mg Intramuscular BID PRN Rankin, Shuvon B, NP      . haloperidol (HALDOL) tablet 5 mg  5 mg Oral Q8H PRN Malvin Johns, MD       Or  . haloperidol lactate (HALDOL) injection 5 mg  5 mg Intramuscular Q6H PRN Malvin Johns, MD      .  LORazepam (ATIVAN) tablet 1 mg  1 mg Oral Q6H Johnn Hai, MD   1 mg at 01/06/20 2240   Or  . LORazepam (ATIVAN) injection 2 mg  2 mg Intramuscular Q6H Johnn Hai, MD      . LORazepam (ATIVAN) tablet 0.5 mg  0.5 mg Oral BID Johnn Hai, MD   0.5 mg at 01/06/20 1703  . modafinil (PROVIGIL) tablet 200 mg  200 mg Oral Daily Rankin, Shuvon B, NP   200 mg at 01/06/20 0816  . risperiDONE (RISPERDAL) tablet 3 mg  3 mg Oral BID Johnn Hai, MD   3 mg at 01/06/20 1702  . traZODone (DESYREL) tablet 150 mg  150 mg Oral QHS Rankin, Shuvon B, NP   150 mg at 01/06/20 2240    Lab Results:  Results for orders placed or performed during the hospital encounter of 01/04/20 (from the past 48 hour(s))  Rapid urine drug screen (hospital performed)     Status: Abnormal   Collection Time:  01/05/20  1:57 PM  Result Value Ref Range   Opiates NONE DETECTED NONE DETECTED   Cocaine NONE DETECTED NONE DETECTED   Benzodiazepines POSITIVE (A) NONE DETECTED   Amphetamines NONE DETECTED NONE DETECTED   Tetrahydrocannabinol NONE DETECTED NONE DETECTED   Barbiturates NONE DETECTED NONE DETECTED    Comment: (NOTE) DRUG SCREEN FOR MEDICAL PURPOSES ONLY.  IF CONFIRMATION IS NEEDED FOR ANY PURPOSE, NOTIFY LAB WITHIN 5 DAYS. LOWEST DETECTABLE LIMITS FOR URINE DRUG SCREEN Drug Class                     Cutoff (ng/mL) Amphetamine and metabolites    1000 Barbiturate and metabolites    200 Benzodiazepine                 694 Tricyclics and metabolites     300 Opiates and metabolites        300 Cocaine and metabolites        300 THC                            50 Performed at [facility], [place] [address], [place], [place] [address]   Urinalysis, Routine w reflex microscopic     Status: Abnormal   Collection Time: 01/05/20  1:57 PM  Result Value Ref Range   Color, Urine YELLOW YELLOW   APPearance CLEAR CLEAR   Specific Gravity, Urine 1.028 1.005 - 1.030   pH 6.0 5.0 - 8.0   Glucose, UA NEGATIVE NEGATIVE mg/dL   Hgb urine dipstick NEGATIVE NEGATIVE   Bilirubin Urine NEGATIVE NEGATIVE   Ketones, ur 5 (A) NEGATIVE mg/dL   Protein, ur NEGATIVE NEGATIVE mg/dL   Nitrite NEGATIVE NEGATIVE   Leukocytes,Ua NEGATIVE NEGATIVE    Comment: Performed at [facility] [address] [place], [place] [address]    Blood Alcohol level:  Lab Results  Component Value Date   [facility] <10 01/04/2020   ETH <10 09/38/1829    Metabolic Disorder Labs: Lab Results  Component Value Date   HGBA1C 5.6 12/27/2019   MPG 114.02 12/27/2019   MPG 105.41 12/04/2019   Lab Results  Component Value Date   PROLACTIN 57.7 (H) 12/04/2019   PROLACTIN 75.7 (H) 10/23/2019   Lab Results  Component Value Date   CHOL 145 12/27/2019   TRIG 59 12/27/2019   HDL 24 (L)  12/27/2019   CHOLHDL 6.0 12/27/2019  VLDL 12 12/27/2019   LDLCALC 109 (H) 12/27/2019   LDLCALC 100 (H) 12/04/2019    Physical Findings: AIMS: Facial and Oral Movements Muscles of Facial Expression: None, normal Lips and Perioral Area: None, normal Jaw: None, normal Tongue: None, normal,Extremity Movements Upper (arms, wrists, hands, fingers): None, normal Lower (legs, knees, ankles, toes): None, normal, Trunk Movements Neck, shoulders, hips: None, normal, Overall Severity Severity of abnormal movements (highest score from questions above): None, normal Incapacitation due to abnormal movements: None, normal Patient's awareness of abnormal movements (rate only patient's report): No Awareness, Dental Status Current problems with teeth and/or dentures?: No Does patient usually wear dentures?: No  CIWA:    COWS:     Musculoskeletal: Strength & Muscle Tone: within normal limits Gait & Station: normal Patient leans: N/A  Psychiatric Specialty Exam: Physical Exam  Review of Systems  Blood pressure 99/70, pulse (!) 127, temperature 98.5 F (36.9 C), temperature source Oral, resp. rate 16, height 5\' 4"  (1.626 m), weight 115.2 kg, last menstrual period 12/26/2019, SpO2 94 %.Body mass index is 43.6 kg/m.  General Appearance: Casual  Eye Contact:  Fair  Speech:  Garbled  Volume:  Decreased  Mood:  Dysphoric  Affect:  Blunt  Thought Process:  Coherent and Goal Directed  Orientation:  Full (Time, Place, and Person)  Thought Content:  Logical and Rumination  Suicidal Thoughts:  No  Homicidal Thoughts:  No  Memory:  Immediate;   Fair Recent;   Fair Remote;   Fair  Judgement:  Fair  Insight:  Fair  Psychomotor Activity:  Normal  Concentration:  Concentration: Good and Attention Span: Good  Recall:  Good  Fund of Knowledge:  Fair  Language:  Fair  Akathisia:  Negative  Handed:  Right  AIMS (if indicated):     Assets:  Leisure Time Physical Health  ADL's:  Intact   Cognition:  WNL  Sleep:  Number of Hours: 10   Treatment Plan Summary: Daily contact with patient to assess and evaluate symptoms and progress in treatment and Medication management Continue modafinil/lorazepam/antipsychotic combination Has long-acting injectable on board Probable discharge Monday Friday   Friday, MD 01/07/2020, 8:51 AM

## 2020-01-07 NOTE — Progress Notes (Signed)
Recreation Therapy Notes  Date: 4.7.21 Time: 1000 Location: 500   Group Topic: Leisure Social worker) Addresses:  Patient will identify positive leisure activities.  Patient will identify one positive benefit of participation in leisure activities.   Intervention: Leisure Group Game  Activity: LRT and patients played some rounds of Dominoes.  Each participant was to get seven dominoes.  The first person to pick their dominoes got the first turn.  One each turn, the player was to match up their piece to one of the pieces that was already down.  The person to who got rid of all their pieces first, wins the game.  Education:  Leisure Education, Building control surveyor  Education Outcome: Acknowledges education/In group clarification offered/Needs additional education  Clinical Observations/Feedback: Pt did not attend group session.     Caroll Rancher, LRT/CTRS         Caroll Rancher A 01/07/2020 11:26 AM

## 2020-01-07 NOTE — Progress Notes (Signed)
[name] called but pt has not given her the code. Pt told of phone call and the need to give her mother the code. Pt did not respond at all. Will continue to monitor.

## 2020-01-07 NOTE — BHH Group Notes (Signed)
Overcoming Obstacles  01/07/2020 1PM  Type of Therapy and Topic:  Group Therapy:  Overcoming Obstacles  Participation Level:  Did Not Attend    Description of Group:    In this group patients will be encouraged to explore what they see as obstacles to their own wellness and recovery. They will be guided to discuss their thoughts, feelings, and behaviors related to these obstacles. The group will process together ways to cope with barriers, with attention given to specific choices patients can make. Each patient will be challenged to identify changes they are motivated to make in order to overcome their obstacles. This group will be process-oriented, with patients participating in exploration of their own experiences as well as giving and receiving support and challenge from other group members.   Therapeutic Goals: 1. Patient will identify personal and current obstacles as they relate to admission. 2. Patient will identify barriers that currently interfere with their wellness or overcoming obstacles.  3. Patient will identify feelings, thought process and behaviors related to these barriers. 4. Patient will identify two changes they are willing to make to overcome these obstacles:      Summary of Patient Progress     Therapeutic Modalities:   Cognitive Behavioral Therapy Solution Focused Therapy Motivational Interviewing Relapse Prevention Therapy    Lowella Dandy, MSW, LCSW 01/07/2020 1:59 PM

## 2020-01-07 NOTE — Progress Notes (Signed)
   01/07/20 2115  Psych Admission Type (Psych Patients Only)  Admission Status Involuntary  Psychosocial Assessment  Patient Complaints None  Eye Contact Avoids  Facial Expression Flat  Affect Flat  Speech Logical/coherent  Interaction Cautious;Guarded;Minimal;Isolative  Motor Activity Slow  Appearance/Hygiene Unremarkable  Behavior Characteristics Calm  Mood Depressed;Preoccupied  Thought Process  Coherency Blocking  Content WDL  Delusions UTA  Perception WDL  Hallucination None reported or observed  Judgment Poor  Confusion None  Danger to Self  Current suicidal ideation? Denies  Danger to Others  Danger to Others None reported or observed   Pt seen in room laying on the bed awake. Pt gives delayed responses to questions. Seems to be responding to internal stimuli. Pt just stares off.

## 2020-01-07 NOTE — Tx Team (Signed)
Interdisciplinary Treatment and Diagnostic Plan Update  01/07/2020 Time of Session: 9:00am Lindsey Richmond MRN: [id number]  Principal Diagnosis: <principal problem not specified>  Secondary Diagnoses: Active Problems:   Schizophrenia ([place])   Schizoaffective disorder, bipolar type ([place])   Current Medications:  Current Facility-Administered Medications  Medication Dose Route Frequency Provider Last Rate Last Admin  . benztropine mesylate (COGENTIN) injection 0.5 mg  0.5 mg Intramuscular BID PRN Rankin, Shuvon B, NP      . haloperidol (HALDOL) tablet 5 mg  5 mg Oral Q8H PRN Johnn Hai, MD       Or  . haloperidol lactate (HALDOL) injection 5 mg  5 mg Intramuscular Q6H PRN Johnn Hai, MD      . LORazepam (ATIVAN) tablet 1 mg  1 mg Oral Q6H Johnn Hai, MD   1 mg at 01/07/20 3267   Or  . LORazepam (ATIVAN) injection 2 mg  2 mg Intramuscular Q6H Johnn Hai, MD      . LORazepam (ATIVAN) tablet 0.5 mg  0.5 mg Oral BID Johnn Hai, MD   0.5 mg at 01/07/20 0852  . modafinil (PROVIGIL) tablet 200 mg  200 mg Oral Daily Rankin, Shuvon B, NP   200 mg at 01/07/20 0852  . risperiDONE (RISPERDAL) tablet 3 mg  3 mg Oral BID Johnn Hai, MD   3 mg at 01/07/20 1245  . traZODone (DESYREL) tablet 150 mg  150 mg Oral QHS Rankin, Shuvon B, NP   150 mg at 01/06/20 2240   PTA Medications: Medications Prior to Admission  Medication Sig Dispense Refill Last Dose  . benztropine (COGENTIN) 0.5 MG tablet Take 1 tablet (0.5 mg total) by mouth 2 (two) times daily. (Patient not taking: Reported on 01/05/2020) 180 tablet 1   . benztropine (COGENTIN) 0.5 MG tablet Take 1 tablet (0.5 mg total) by mouth 2 (two) times daily as needed for tremors (EPS). 60 tablet 2   . cloNIDine (CATAPRES) 0.1 MG tablet Take 1 tablet (0.1 mg total) by mouth 2 (two) times daily. (Patient not taking: Reported on 01/05/2020) 60 tablet 2   . cloNIDine (CATAPRES) 0.1 MG tablet Take 0.1 mg by mouth 2 (two) times daily.     . modafinil (PROVIGIL)  200 MG tablet Take 1 tablet (200 mg total) by mouth daily. 7 tablet 0   . paliperidone (INVEGA SUSTENNA) 156 MG/ML SUSY injection Inject 156 mg into the muscle every 30 (thirty) days.     . risperiDONE (RISPERDAL) 3 MG tablet Take 1 tablet (3 mg total) by mouth at bedtime. 30 tablet 2   . traZODone (DESYREL) 150 MG tablet Take 1 tablet (150 mg total) by mouth at bedtime as needed for sleep. (Patient not taking: Reported on 01/05/2020) 90 tablet 1   . traZODone (DESYREL) 150 MG tablet Take 150 mg by mouth at bedtime.       Patient Stressors:    Patient Strengths:    Treatment Modalities: Medication Management, Group therapy, Case management,  1 to 1 session with clinician, Psychoeducation, Recreational therapy.   Physician Treatment Plan for Primary Diagnosis: <principal problem not specified> Long Term Goal(s): Improvement in symptoms so as ready for discharge Improvement in symptoms so as ready for discharge   Short Term Goals: Ability to identify changes in lifestyle to reduce recurrence of condition will improve Ability to verbalize feelings will improve Ability to disclose and discuss suicidal ideas Ability to demonstrate self-control will improve Ability to identify and develop effective coping behaviors will improve  Medication  Management: Evaluate patient's response, side effects, and tolerance of medication regimen.  Therapeutic Interventions: 1 to 1 sessions, Unit Group sessions and Medication administration.  Evaluation of Outcomes: Progressing  Physician Treatment Plan for Secondary Diagnosis: Active Problems:   Schizophrenia ([place])   Schizoaffective disorder, bipolar type ([place])  Long Term Goal(s): Improvement in symptoms so as ready for discharge Improvement in symptoms so as ready for discharge   Short Term Goals: Ability to identify changes in lifestyle to reduce recurrence of condition will improve Ability to verbalize feelings will improve Ability to disclose and  discuss suicidal ideas Ability to demonstrate self-control will improve Ability to identify and develop effective coping behaviors will improve     Medication Management: Evaluate patient's response, side effects, and tolerance of medication regimen.  Therapeutic Interventions: 1 to 1 sessions, Unit Group sessions and Medication administration.  Evaluation of Outcomes: Progressing   RN Treatment Plan for Primary Diagnosis: <principal problem not specified> Long Term Goal(s): Knowledge of disease and therapeutic regimen to maintain health will improve  Short Term Goals: Ability to identify and develop effective coping behaviors will improve and Compliance with prescribed medications will improve  Medication Management: RN will administer medications as ordered by provider, will assess and evaluate patient's response and provide education to patient for prescribed medication. RN will report any adverse and/or side effects to prescribing provider.  Therapeutic Interventions: 1 on 1 counseling sessions, Psychoeducation, Medication administration, Evaluate responses to treatment, Monitor vital signs and CBGs as ordered, Perform/monitor CIWA, COWS, AIMS and Fall Risk screenings as ordered, Perform wound care treatments as ordered.  Evaluation of Outcomes: Progressing   LCSW Treatment Plan for Primary Diagnosis: <principal problem not specified> Long Term Goal(s): Safe transition to appropriate next level of care at discharge, Engage patient in therapeutic group addressing interpersonal concerns.  Short Term Goals: Engage patient in aftercare planning with referrals and resources, Increase social support, Facilitate acceptance of mental health diagnosis and concerns, Identify triggers associated with mental health/substance abuse issues and Increase skills for wellness and recovery  Therapeutic Interventions: Assess for all discharge needs, 1 to 1 time with Social worker, Explore available  resources and support systems, Assess for adequacy in community support network, Educate family and significant other(s) on suicide prevention, Complete Psychosocial Assessment, Interpersonal group therapy.  Evaluation of Outcomes: Not Met  Progress in Treatment: Attending groups: No. Participating in groups: No. Taking medication as prescribed: Yes. Toleration medication: Yes. Family/Significant other contact made: No, will contact:  patient declined consents to speak with family. Patient understands diagnosis: Yes. Discussing patient identified problems/goals with staff: No. Medical problems stabilized or resolved: Yes. Denies suicidal/homicidal ideation: Yes. Issues/concerns per patient self-inventory: Yes.  New problem(s) identified: Yes, Describe:  medication non-compliance, no insurance.  New Short Term/Long Term Goal(s): medication management for mood stabilization; elimination of SI thoughts; development of comprehensive mental wellness/sobriety plan.  Patient Goals:  Patient was invited by CSW and MHT to attend treatment team, patient declined to participate.  Discharge Plan or Barriers: Patient would benefit from ACTT services, but does not have Medicaid. Patient will return home and continue outpatient follow up with [facility].  Reason for Continuation of Hospitalization: Anxiety Delusions  Hallucinations Medication stabilization  Estimated Length of Stay: 1-2 days  Attendees: Patient:  01/07/2020 9:18 AM  Physician: Dr.Farah  01/07/2020 9:18 AM  Nursing:  01/07/2020 9:18 AM  RN Care Manager: 01/07/2020 9:18 AM  Social Worker: Stephanie Acre, Latanya Presser 01/07/2020 9:18 AM  Recreational Therapist:  01/07/2020 9:18 AM  Other:  01/07/2020 9:18 AM  Other:  01/07/2020 9:18 AM  Other: 01/07/2020 9:18 AM    Scribe for Treatment Team: Joellen [place], [place] 01/07/2020 9:18 AM

## 2020-01-07 NOTE — Progress Notes (Signed)
D: PT is alert and oriented to person, place, but not to time and situation. Pt is very slow to respond, thought blocking, selectively mute, makes poor eye contact, isolates in her room, is medication complaint. Pt is disheveled, stands in her room with her hair draped over her face. Pt is hypoverbal, hypoactive, denies suicidal and homicidal ideation. A: Provided pt with scheduled medications, emotional support, encouraged pt to attend groups, maintained safety.  P: Will continue to monitor pt per Q15 minute face checks, monitor for safety and progress.

## 2020-01-08 MED ORDER — LORAZEPAM 2 MG/ML IJ SOLN: 2.0000 mg | Freq: Four times a day (QID) | INTRAMUSCULAR | Status: DC | PRN

## 2020-01-08 MED ORDER — LORAZEPAM 1 MG PO TABS
1.0000 mg | ORAL_TABLET | Freq: Four times a day (QID) | ORAL | Status: DC | PRN
  Administered 2020-01-10: 1 mg via ORAL
  Filled 2020-01-08: qty 1

## 2020-01-08 NOTE — Progress Notes (Signed)
   01/08/20 2111  Psych Admission Type (Psych Patients Only)  Admission Status Involuntary  Psychosocial Assessment  Patient Complaints None  Eye Contact Avoids  Facial Expression Flat  Affect Flat  Speech Elective mutism  Interaction Cautious;Guarded;Minimal;Isolative  Motor Activity Slow  Appearance/Hygiene Unremarkable  Behavior Characteristics Guarded  Mood Depressed;Preoccupied  Aggressive Behavior  Effect No apparent injury  Thought Process  Coherency Blocking  Content WDL  Delusions UTA  Perception UTA  Hallucination None reported or observed  Judgment Poor  Confusion None  Danger to Self  Current suicidal ideation? Denies   Pt sitting in room staring into the distance. Answers some questions with one-word answers. Others she is mute. Responding to internal stimuli.

## 2020-01-08 NOTE — Progress Notes (Signed)
Recreation Therapy Notes  Date: 4.8.21 Time: 0955 Location: 500 Hall Dayroom  Group Topic: Self-Esteem  Goal Area(s) Addresses:  Patient will successfully identify positive attributes about themselves.  Patient will successfully identify benefit of improved self-esteem.   Intervention: Magazines, Holiday representative paper, scissors, glue sticks, music  Activity: Collage About Me.  Patients were to use supplies provided to create a collage of things that represent them.  Patients were to show things they like, things they want to accomplish, things that are important to them, etc.  Education:  Self-Esteem, Discharge Planning.   Education Outcome: Acknowledges education/In group clarification offered/Needs additional education  Clinical Observations/Feedback:  Pt did not attend group.     Caroll Rancher, LRT/CTRS     Lillia Abed, Patrisha Hausmann A 01/08/2020 11:01 AM

## 2020-01-08 NOTE — Progress Notes (Signed)
Pt refused medications this evening, stating, "I'm fine. I'll be okay." Will continue to monitor.

## 2020-01-08 NOTE — [facility] Counselor (Signed)
CSW spoke with patient's [facility] Care Coordinator, Sandra ([phone]). CSW reviewed plan for outpatient follow up and contact information with Care Coordinator.  Damen Windsor, MSW, LCSW-A Clinical Social Worker [facility] Adult Unit  

## 2020-01-08 NOTE — Progress Notes (Signed)
[facility] MD Progress Note  01/08/2020 3:01 PM Lindsey Richmond  MRN:  [id number] Subjective: Patient states she is not ready to go home he is "another day" she is vague she did not appear to be responding she denies thoughts of harming self or others can contract here Principal Problem: <principal problem not specified> Diagnosis: Active Problems:   Schizophrenia ([place])   Schizoaffective disorder, bipolar type ([place])  Total Time spent with patient: 20 minutes  Past Psychiatric History: see eval  Past Medical History:  Past Medical History:  Diagnosis Date  . Depression   . Oppositional defiant behavior   . Schizo affective schizophrenia ([place])   . Schizophrenia ([place])    History reviewed. No pertinent surgical history. Family History:  Family History  Problem Relation Age of Onset  . Heart failure Mother   . Hypertension Mother   . Diabetes Father    Family Psychiatric  History: see eval Social History:  Social History   Substance and Sexual Activity  Alcohol Use No     Social History   Substance and Sexual Activity  Drug Use No    Social History   Socioeconomic History  . Marital status: Single    Spouse name: Not on file  . Number of children: Not on file  . Years of education: Not on file  . Highest education level: Not on file  Occupational History  . Occupation: Unemployed  Tobacco Use  . Smoking status: Never Smoker  . Smokeless tobacco: Never Used  Substance and Sexual Activity  . Alcohol use: No  . Drug use: No  . Sexual activity: Never    Birth control/protection: None  Other Topics Concern  . Not on file  Social History Narrative   ** Merged History Encounter **       Pt lives with her mother, brother, and father.  She receives outpatient psychiatric care through [facility].   Social Determinants of Health   Financial Resource Strain:   . Difficulty of Paying Living Expenses:   Food Insecurity:   . Worried About Charity fundraiser in the Last Year:   .  Arboriculturist in the Last Year:   Transportation Needs:   . Film/video editor (Medical):   Marland Kitchen Lack of Transportation (Non-Medical):   Physical Activity:   . Days of Exercise per Week:   . Minutes of Exercise per Session:   Stress:   . Feeling of Stress :   Social Connections:   . Frequency of Communication with Friends and Family:   . Frequency of Social Gatherings with Friends and Family:   . Attends Religious Services:   . Active Member of Clubs or Organizations:   . Attends Archivist Meetings:   Marland Kitchen Marital Status:    Sleep: Fair  Appetite:  Fair  Current Medications: Current Facility-Administered Medications  Medication Dose Route Frequency Provider Last Rate Last Admin  . benztropine mesylate (COGENTIN) injection 0.5 mg  0.5 mg Intramuscular BID PRN Rankin, Shuvon B, NP      . haloperidol (HALDOL) tablet 5 mg  5 mg Oral Q8H PRN Johnn Hai, MD       Or  . haloperidol lactate (HALDOL) injection 5 mg  5 mg Intramuscular Q6H PRN Johnn Hai, MD      . LORazepam (ATIVAN) tablet 1 mg  1 mg Oral Q6H PRN Johnn Hai, MD       Or  . LORazepam (ATIVAN) injection 2 mg  2 mg Intramuscular  Q6H PRN Malvin Johns, MD      . LORazepam Horton Marshall) tablet 0.5 mg  0.5 mg Oral BID Malvin Johns, MD   0.5 mg at 01/08/20 0752  . modafinil (PROVIGIL) tablet 200 mg  200 mg Oral Daily Rankin, Shuvon B, NP   200 mg at 01/08/20 0752  . risperiDONE (RISPERDAL) tablet 3 mg  3 mg Oral BID Malvin Johns, MD   3 mg at 01/08/20 0752  . traZODone (DESYREL) tablet 150 mg  150 mg Oral QHS Rankin, Shuvon B, NP   150 mg at 01/06/20 2240    Lab Results: No results found for this or any previous visit (from the past 48 hour(s)).  Blood Alcohol level:  Lab Results  Component Value Date   ETH <10 01/04/2020   ETH <10 12/24/2019    Metabolic Disorder Labs: Lab Results  Component Value Date   HGBA1C 5.6 12/27/2019   MPG 114.02 12/27/2019   MPG 105.41 12/04/2019   Lab Results  Component  Value Date   PROLACTIN 57.7 (H) 12/04/2019   PROLACTIN 75.7 (H) 10/23/2019   Lab Results  Component Value Date   CHOL 145 12/27/2019   TRIG 59 12/27/2019   HDL 24 (L) 12/27/2019   CHOLHDL 6.0 12/27/2019   VLDL 12 12/27/2019   LDLCALC 109 (H) 12/27/2019   LDLCALC 100 (H) 12/04/2019    Physical Findings: AIMS: Facial and Oral Movements Muscles of Facial Expression: None, normal Lips and Perioral Area: None, normal Jaw: None, normal Tongue: None, normal,Extremity Movements Upper (arms, wrists, hands, fingers): None, normal Lower (legs, knees, ankles, toes): None, normal, Trunk Movements Neck, shoulders, hips: None, normal, Overall Severity Severity of abnormal movements (highest score from questions above): None, normal Incapacitation due to abnormal movements: None, normal Patient's awareness of abnormal movements (rate only patient's report): No Awareness, Dental Status Current problems with teeth and/or dentures?: No Does patient usually wear dentures?: No  CIWA:    COWS:     Musculoskeletal: Strength & Muscle Tone: within normal limits Gait & Station: normal Patient leans: N/A  Psychiatric Specialty Exam: Physical Exam  Review of Systems  Blood pressure (!) 107/58, pulse (!) 118, temperature 98.7 F (37.1 C), temperature source Oral, resp. rate 20, height 5\' 4"  (1.626 m), weight 115.2 kg, last menstrual period 12/26/2019, SpO2 94 %.Body mass index is 43.6 kg/m.  General Appearance: Casual  Eye Contact:  Fair  Speech:  Clear and Coherent  Volume:  Decreased  Mood:  Anxious and Dysphoric  Affect:  Congruent and Constricted  Thought Process:  Irrelevant  Orientation:  Full (Time, Place, and Person)  Thought Content:  Rumination  Suicidal Thoughts:  No  Homicidal Thoughts:  No  Memory:  Immediate;   Fair Recent;   Fair Remote;   Fair  Judgement:  Fair  Insight:  Fair  Psychomotor Activity:  Normal  Concentration:  Concentration: Fair and Attention Span: Fair   Recall:  12/28/2019 of Knowledge:  Fair  Language:  Good  Akathisia:  Negative  Handed:  Right  AIMS (if indicated):     Assets:  Physical Health Resilience  ADL's:  Intact  Cognition:  WNL  Sleep:  Number of Hours: 3.5     Treatment Plan Summary: Daily contact with patient to assess and evaluate symptoms and progress in treatment and Medication management  No change in medications continue current therapies medical tomorrow  [employer], MD 01/08/2020, 3:01 PM

## 2020-01-08 NOTE — [facility] Counselor (Signed)
Patient's mother, Jamita Mckelvin [phone]) called hoping to speak with staff or receive treatment updates. Mother does not have the code and CSW was not granted permission to speak with mother.  Enid Cutter, MSW, LCSW-A Clinical Social Worker [facility] Adult Unit

## 2020-01-09 MED ORDER — DIPHENHYDRAMINE HCL 25 MG PO CAPS
ORAL_CAPSULE | ORAL | Status: AC
  Filled 2020-01-09: qty 2

## 2020-01-09 NOTE — [facility] Group Notes (Addendum)
LCSW Wellness Group Note   01/09/2020 11:00am  Type of Group and Topic: Psychoeducational Group:  Wellness  Participation Level:  minimal  Description of Group  Wellness group introduces the topic and its focus on developing healthy habits across the spectrum and its relationship to a decrease in hospital admissions.  Six areas of wellness are discussed: physical, social spiritual, intellectual, occupational, and emotional.  Patients are asked to consider their current wellness habits and to identify areas of wellness where they are interested and able to focus on improvements.    Therapeutic Goals 1. Patients will understand components of wellness and how they can positively impact overall health.  2. Patients will identify areas of wellness where they have developed good habits. 3. Patients will identify areas of wellness where they would like to make improvements.    Summary of Patient Progress: Pt in and out of group and was not present for the majority of it.  Pt present initially but not engaged and staring.  Pt left shortly thereafter and returned one other time but did not participate.     Therapeutic Modalities: Cognitive Behavioral Therapy Psychoeducation    Lorri Frederick, LCSW

## 2020-01-09 NOTE — Progress Notes (Signed)
D: Pt denies SI/HI/AV hallucinations. Pt  Has been in her room most of evening. She is somewhat guarded and interacts minimal.  A: Pt was offered support and encouragement. Q 15 minute checks were done for safety.  R: Pt has no complaints.Pt receptive to treatment and safety maintained on unit.

## 2020-01-09 NOTE — Progress Notes (Signed)
Recreation Therapy Notes  Date: 4.9.21 Time: 1000 Location: 500 Hall Dayroom Group Topic: Communication, Team Building, Problem Solving  Goal Area(s) Addresses:  Patient will effectively work with peer towards shared goal.  Patient will identify skills used to make activity successful.  Patient will identify how skills used during activity can be used to reach post d/c goals.   Intervention: STEM Activity  Activity: Landing Pad. In teams patients were given 12 plastic drinking straws and a length of masking tape. Using the materials provided patients were asked to build a landing pad to catch a golf ball dropped from approximately 6 feet in the air.   Education: Social Skills, Discharge Planning   Education Outcome: Acknowledges education/In group clarification offered/Needs additional education.   Clinical Observations/Feedback: Pt did not group session.    Wade Sigala, LRT/CTRS         Gabriela Giannelli A 01/09/2020 11:42 AM 

## 2020-01-09 NOTE — Progress Notes (Signed)
[facility] MD Progress Note  01/09/2020 11:21 AM Lindsey Richmond  MRN:  [id number] Subjective:   History of Present Illness: Lindsey Richmond is a 24 year old female with history of schizophrenia. She was recently discharged from our unit on 12/25/19 after admission for similar presentation. She was discharged on Invega Sustenna, Risperdal 3 mg QHS, Cogentin 0.5 mg BID, Provigil 200 mg daily, and trazodone 150 mg QHS. She had an admission here 12/03/19-12/09/19 as well. She returned to the ED on 01/04/20, brought in by EMS after being [place]. Patient received Gean Birchwood 234 mg during recent admission but admits that she was not taking oral medications since discharge.  Patient was seen this morning on rounds, she is alert and oriented to person place time situation not exact date.  She has extremely slow processing speed she may pause up to 20 seconds before she answers a question and then does so minimally.  She states she is not ready to go home but cannot give me specifics.  She denies previously expressed hyper religious content and she denies current auditory or visual hallucinations but has again a great poverty of content to her speech and thought and is very slow to process but I would not describe it as thought blocking.  There are no involuntary movements though she has a stiff appearance there is no cogwheel rigidity  Principal Problem: Schizophrenia/multiple hospitalizations and this 1 immediately following her last discharge / somewhat treatment resistant Diagnosis: Active Problems:   Schizophrenia (HCC)   Schizoaffective disorder, bipolar type (HCC)  Total Time spent with patient: 20 minutes  Past Psychiatric History: see eval  Past Medical History:  Past Medical History:  Diagnosis Date  . Depression   . Oppositional defiant behavior   . Schizo affective schizophrenia (HCC)   . Schizophrenia (HCC)    History reviewed. No pertinent surgical history. Family History:   Family History  Problem Relation Age of Onset  . Heart failure Mother   . Hypertension Mother   . Diabetes Father    Family Psychiatric  History: see eval Social History:  Social History   Substance and Sexual Activity  Alcohol Use No     Social History   Substance and Sexual Activity  Drug Use No    Social History   Socioeconomic History  . Marital status: Single    Spouse name: Not on file  . Number of children: Not on file  . Years of education: Not on file  . Highest education level: Not on file  Occupational History  . Occupation: Unemployed  Tobacco Use  . Smoking status: Never Smoker  . Smokeless tobacco: Never Used  Substance and Sexual Activity  . Alcohol use: No  . Drug use: No  . Sexual activity: Never    Birth control/protection: None  Other Topics Concern  . Not on file  Social History Narrative   ** Merged History Encounter **       Pt lives with her mother, brother, and father.  She receives outpatient psychiatric care through [facility].   Social Determinants of Health   Financial Resource Strain:   . Difficulty of Paying Living Expenses:   Food Insecurity:   . Worried About Programme researcher, broadcasting/film/video in the Last Year:   . Barista in the Last Year:   Transportation Needs:   . Freight forwarder (Medical):   Marland Kitchen Lack of Transportation (Non-Medical):   Physical Activity:   . Days  of Exercise per Week:   . Minutes of Exercise per Session:   Stress:   . Feeling of Stress :   Social Connections:   . Frequency of Communication with Friends and Family:   . Frequency of Social Gatherings with Friends and Family:   . Attends Religious Services:   . Active Member of Clubs or Organizations:   . Attends Banker Meetings:   Marland Kitchen Marital Status:     Sleep: Good Appetite:  Fair  Current Medications: Current Facility-Administered Medications  Medication Dose Route Frequency Provider Last Rate Last Admin  . benztropine mesylate  (COGENTIN) injection 0.5 mg  0.5 mg Intramuscular BID PRN Rankin, Shuvon B, NP      . diphenhydrAMINE (BENADRYL) 25 mg capsule           . haloperidol (HALDOL) tablet 5 mg  5 mg Oral Q8H PRN Malvin Johns, MD       Or  . haloperidol lactate (HALDOL) injection 5 mg  5 mg Intramuscular Q6H PRN Malvin Johns, MD      . LORazepam (ATIVAN) tablet 1 mg  1 mg Oral Q6H PRN Malvin Johns, MD       Or  . LORazepam (ATIVAN) injection 2 mg  2 mg Intramuscular Q6H PRN Malvin Johns, MD      . LORazepam (ATIVAN) tablet 0.5 mg  0.5 mg Oral BID Malvin Johns, MD   0.5 mg at 01/09/20 0911  . modafinil (PROVIGIL) tablet 200 mg  200 mg Oral Daily Rankin, Shuvon B, NP   200 mg at 01/09/20 0910  . risperiDONE (RISPERDAL) tablet 3 mg  3 mg Oral BID Malvin Johns, MD   3 mg at 01/09/20 0911  . traZODone (DESYREL) tablet 150 mg  150 mg Oral QHS Rankin, Shuvon B, NP   150 mg at 01/08/20 2125    Lab Results: No results found for this or any previous visit (from the past 48 hour(s)).  Blood Alcohol level:  Lab Results  Component Value Date   ETH <10 01/04/2020   ETH <10 12/24/2019    Metabolic Disorder Labs: Lab Results  Component Value Date   HGBA1C 5.6 12/27/2019   MPG 114.02 12/27/2019   MPG 105.41 12/04/2019   Lab Results  Component Value Date   PROLACTIN 57.7 (H) 12/04/2019   PROLACTIN 75.7 (H) 10/23/2019   Lab Results  Component Value Date   CHOL 145 12/27/2019   TRIG 59 12/27/2019   HDL 24 (L) 12/27/2019   CHOLHDL 6.0 12/27/2019   VLDL 12 12/27/2019   LDLCALC 109 (H) 12/27/2019   LDLCALC 100 (H) 12/04/2019    Physical Findings: AIMS: Facial and Oral Movements Muscles of Facial Expression: None, normal Lips and Perioral Area: None, normal Jaw: None, normal Tongue: None, normal,Extremity Movements Upper (arms, wrists, hands, fingers): None, normal Lower (legs, knees, ankles, toes): None, normal, Trunk Movements Neck, shoulders, hips: None, normal, Overall Severity Severity of abnormal  movements (highest score from questions above): None, normal Incapacitation due to abnormal movements: None, normal Patient's awareness of abnormal movements (rate only patient's report): No Awareness, Dental Status Current problems with teeth and/or dentures?: No Does patient usually wear dentures?: No  CIWA:    COWS:  COWS Total Score: 1  Musculoskeletal: Strength & Muscle Tone: within normal limits Gait & Station: normal Patient leans: N/A  Psychiatric Specialty Exam: Physical Exam  Review of Systems  Blood pressure 118/77, pulse 80, temperature 98.2 F (36.8 C), temperature source Oral, resp. rate 18,  height 5\' 4"  (1.626 m), weight 115.2 kg, last menstrual period 12/26/2019, SpO2 94 %.Body mass index is 43.6 kg/m.  General Appearance: Casual  Eye Contact:  None  Speech:  Slow  Volume:  Decreased  Mood:  Dysphoric  Affect:  Blunt  Thought Process:  Descriptions of Associations: Circumstantial  Orientation:  Full (Time, Place, and Person) not to date  Thought Content: Denies auditory or visual hallucinations but significant poverty of content and slow processing speed  Suicidal Thoughts:  No  Homicidal Thoughts:  No  Memory:  Immediate;   Fair Recent;   Fair Remote;   Fair  Judgement:  Fair  Insight:  Fair  Psychomotor Activity:  Decreased  Concentration:  Concentration: Fair and Attention Span: Fair  Recall:  [employer] of Knowledge:  Fair  Language:  Minimally talkative  Akathisia:  Negative  Handed:  Right  AIMS (if indicated):     Assets:  Desire for Improvement Leisure Time Physical Health Resilience  ADL's:  Intact  Cognition:  WNL  Sleep:  Number of Hours: 8.75   Treatment Plan Summary: Daily contact with patient to assess and evaluate symptoms and progress in treatment and Medication management  For treatment resistant schizophrenia, patient is on long-acting injectable paliperidone last administered late March -next due on around 4/25 Continue  supplemental risperidone continue modafinil for catatonic features continue reality-based therapy cognitive therapy and med and illness education Medication adjustments today include escalation of modafinil and escalation of lorazepam  Johnn Hai, MD 01/09/2020, 11:21 AM

## 2020-01-09 NOTE — Progress Notes (Signed)
Pt has spent most of the day in her room.  Pt declined to eat lunch .Pt Rated depression 5 out of 10, Anxiety at a 4/10.  RN provided emotional support and administered medications as prescribed. No immediate concerns identified at this time.  RN will continue to monitor and provide support as needed.       01/09/20 0915  Psych Admission Type (Psych Patients Only)  Admission Status Involuntary  Psychosocial Assessment  Patient Complaints None  Eye Contact Avoids  Facial Expression Flat  Affect Flat  Speech Elective mutism  Interaction Cautious;Guarded;Minimal;Isolative  Motor Activity Slow  Appearance/Hygiene Unremarkable  Behavior Characteristics Unwilling to participate  Mood Depressed  Aggressive Behavior  Effect No apparent injury  Thought Process  Coherency Blocking  Content WDL  Delusions None reported or observed  Perception WDL  Hallucination None reported or observed  Judgment Poor  Confusion None  Danger to Self  Current suicidal ideation? Denies  Danger to Others  Danger to Others None reported or observed

## 2020-01-09 NOTE — Progress Notes (Signed)
Pt laying on the floor staring at the ceiling. Pt appears in no apparent distress. Exhibiting same mutism and blank stare as has been throughout this shift. Pt not verbally responding to anything asked. Will continue to monitor.

## 2020-01-10 MED ORDER — LORAZEPAM 1 MG PO TABS
1.0000 mg | ORAL_TABLET | Freq: Three times a day (TID) | ORAL | Status: DC
  Administered 2020-01-10 – 2020-01-11 (×3): 1 mg via ORAL
  Filled 2020-01-10 (×3): qty 1

## 2020-01-10 MED ORDER — RISPERIDONE 3 MG PO TABS
3.0000 mg | ORAL_TABLET | Freq: Every day | ORAL | Status: DC
  Administered 2020-01-11 – 2020-01-12 (×2): 3 mg via ORAL
  Filled 2020-01-10 (×3): qty 1

## 2020-01-10 MED ORDER — RISPERIDONE 2 MG PO TBDP
4.0000 mg | ORAL_TABLET | Freq: Every day | ORAL | Status: DC
  Administered 2020-01-10 – 2020-01-11 (×2): 4 mg via ORAL
  Filled 2020-01-10 (×3): qty 2

## 2020-01-10 NOTE — Progress Notes (Signed)
[facility] MD Progress Note  01/10/2020 11:04 AM Kenneshia Rehm  MRN:  [id number] Subjective: Patient is a 24 year old female who was readmitted to the hospital on 01/06/20 secondary to worsening symptoms of schizophrenia including hyper religiosity.  The patient had been brought by emergency medical services to the behavioral health hospital after having been found [place].  Objective: Patient is seen and examined.  Patient is a 24 year old female with the above-stated past psychiatric history who is seen in follow-up.  Review of the nursing notes reflect that she was highly agitated last night.  She apparently tried to escape from the unit.  She also had very decreased sleep and only got approximately 2 hours of sleep last night.  On examination today she is staring out of window, mumbling and somewhat disorganized.  She did admit to auditory hallucinations and was clearly still hyper religious.  Patient is familiar to me from a previous psychiatric hospitalization in which she had a significant episode of catatonic schizophrenia.  Her current medications mood Risperdal 3 mg p.o. twice daily, lorazepam 0.5 mg p.o. twice daily, Cogentin and trazodone.  She also admitted to auditory hallucinations today.  Her vital signs are stable, she is afebrile.  She slept 1.75 hours last night.  Review of her admission laboratories on 01/04/2020 showed essentially normal electrolytes, essentially normal lipids, a slight anemia but otherwise normal CBC.  Her hemoglobin A1c is elevated at 5.6.  TSH was normal at 3.090.  Urinalysis was significant for 5 mg per DL of ketones, rare bacteria, and 21-50 red blood cells.  Hemoglobin urine dipstick was negative.  Drug screen was positive for benzodiazepines.  Beta-hCG was negative.  EKG showed a sinus rhythm with a normal QTc interval.  Principal Problem: <principal problem not specified> Diagnosis: Active Problems:   Schizophrenia (HCC)   Schizoaffective disorder,  bipolar type (HCC)  Total Time spent with patient: 20 minutes  Past Psychiatric History: See admission H&P  Past Medical History:  Past Medical History:  Diagnosis Date  . Depression   . Oppositional defiant behavior   . Schizo affective schizophrenia (HCC)   . Schizophrenia (HCC)    History reviewed. No pertinent surgical history. Family History:  Family History  Problem Relation Age of Onset  . Heart failure Mother   . Hypertension Mother   . Diabetes Father    Family Psychiatric  History: See admission H&P Social History:  Social History   Substance and Sexual Activity  Alcohol Use No     Social History   Substance and Sexual Activity  Drug Use No    Social History   Socioeconomic History  . Marital status: Single    Spouse name: Not on file  . Number of children: Not on file  . Years of education: Not on file  . Highest education level: Not on file  Occupational History  . Occupation: Unemployed  Tobacco Use  . Smoking status: Never Smoker  . Smokeless tobacco: Never Used  Substance and Sexual Activity  . Alcohol use: No  . Drug use: No  . Sexual activity: Never    Birth control/protection: None  Other Topics Concern  . Not on file  Social History Narrative   ** Merged History Encounter **       Pt lives with her mother, brother, and father.  She receives outpatient psychiatric care through [facility].   Social Determinants of Health   Financial Resource Strain:   . Difficulty of Paying Living  Expenses:   Food Insecurity:   . Worried About Charity fundraiser in the Last Year:   . Arboriculturist in the Last Year:   Transportation Needs:   . Film/video editor (Medical):   Marland Kitchen Lack of Transportation (Non-Medical):   Physical Activity:   . Days of Exercise per Week:   . Minutes of Exercise per Session:   Stress:   . Feeling of Stress :   Social Connections:   . Frequency of Communication with Friends and Family:   . Frequency of Social  Gatherings with Friends and Family:   . Attends Religious Services:   . Active Member of Clubs or Organizations:   . Attends Archivist Meetings:   Marland Kitchen Marital Status:    Additional Social History:                         Sleep: Poor  Appetite:  Fair  Current Medications: Current Facility-Administered Medications  Medication Dose Route Frequency Provider Last Rate Last Admin  . benztropine mesylate (COGENTIN) injection 0.5 mg  0.5 mg Intramuscular BID PRN Rankin, Shuvon B, NP      . haloperidol (HALDOL) tablet 5 mg  5 mg Oral Q8H PRN Johnn Hai, MD       Or  . haloperidol lactate (HALDOL) injection 5 mg  5 mg Intramuscular Q6H PRN Johnn Hai, MD      . LORazepam (ATIVAN) tablet 1 mg  1 mg Oral Q6H PRN Johnn Hai, MD   1 mg at 01/10/20 0059   Or  . LORazepam (ATIVAN) injection 2 mg  2 mg Intramuscular Q6H PRN Johnn Hai, MD      . LORazepam (ATIVAN) tablet 1 mg  1 mg Oral TID Sharma Covert, MD      . risperiDONE (RISPERDAL M-TABS) disintegrating tablet 4 mg  4 mg Oral QHS Sharma Covert, MD      . Derrill Memo ON 01/11/2020] risperiDONE (RISPERDAL) tablet 3 mg  3 mg Oral Daily Sharma Covert, MD      . traZODone (DESYREL) tablet 150 mg  150 mg Oral QHS Rankin, Shuvon B, NP   150 mg at 01/09/20 2200    Lab Results: No results found for this or any previous visit (from the past 56 hour(s)).  Blood Alcohol level:  Lab Results  Component Value Date   ETH <10 01/04/2020   ETH <10 40/81/4481    Metabolic Disorder Labs: Lab Results  Component Value Date   HGBA1C 5.6 12/27/2019   MPG 114.02 12/27/2019   MPG 105.41 12/04/2019   Lab Results  Component Value Date   PROLACTIN 57.7 (H) 12/04/2019   PROLACTIN 75.7 (H) 10/23/2019   Lab Results  Component Value Date   CHOL 145 12/27/2019   TRIG 59 12/27/2019   HDL 24 (L) 12/27/2019   CHOLHDL 6.0 12/27/2019   VLDL 12 12/27/2019   LDLCALC 109 (H) 12/27/2019   LDLCALC 100 (H) 12/04/2019     Physical Findings: AIMS: Facial and Oral Movements Muscles of Facial Expression: None, normal Lips and Perioral Area: None, normal Jaw: None, normal Tongue: None, normal,Extremity Movements Upper (arms, wrists, hands, fingers): None, normal Lower (legs, knees, ankles, toes): None, normal, Trunk Movements Neck, shoulders, hips: None, normal, Overall Severity Severity of abnormal movements (highest score from questions above): None, normal Incapacitation due to abnormal movements: None, normal Patient's awareness of abnormal movements (rate only patient's report): No Awareness, Dental Status  Current problems with teeth and/or dentures?: No Does patient usually wear dentures?: No  CIWA:    COWS:  COWS Total Score: 1  Musculoskeletal: Strength & Muscle Tone: within normal limits Gait & Station: normal Patient leans: N/A  Psychiatric Specialty Exam: Physical Exam  Nursing note and vitals reviewed. Constitutional: She is oriented to person, place, and time. She appears well-developed and well-nourished.  HENT:  Head: Normocephalic and atraumatic.  Respiratory: Effort normal.  Neurological: She is alert and oriented to person, place, and time.    Review of Systems  Blood pressure 118/77, pulse 80, temperature 98.2 F (36.8 C), temperature source Oral, resp. rate 18, height 5\' 4"  (1.626 m), weight 115.2 kg, last menstrual period 12/26/2019, SpO2 94 %.Body mass index is 43.6 kg/m.  General Appearance: Disheveled  Eye Contact:  Fair  Speech:  Garbled and Slow  Volume:  Decreased  Mood:  Dysphoric  Affect:  Flat  Thought Process:  Disorganized and Descriptions of Associations: Loose  Orientation:  Negative  Thought Content:  Delusions, Hallucinations: Auditory and Paranoid Ideation  Suicidal Thoughts:  No  Homicidal Thoughts:  No  Memory:  Immediate;   Poor Recent;   Poor Remote;   Poor  Judgement:  Impaired  Insight:  Lacking  Psychomotor Activity:  Decreased   Concentration:  Concentration: Poor and Attention Span: Poor  Recall:  Poor  Fund of Knowledge:  Poor  Language:  Poor  Akathisia:  Negative  Handed:  Right  AIMS (if indicated):     Assets:  Desire for Improvement Resilience  ADL's:  Impaired  Cognition:  WNL  Sleep:  Number of Hours: 1.75     Treatment Plan Summary: Daily contact with patient to assess and evaluate symptoms and progress in treatment, Medication management and Plan : Patient is seen and examined.  Patient is a 24 year old female with the above-stated past psychiatric history who is seen in follow-up.   Diagnosis: #1 schizophrenia, #2 borderline diabetes type 2  Patient is seen in follow-up.  Patient remains significantly psychotic, and although not as catatonic as previously seen on examination still very slow, poorly communicative, disorganized.  Last night she was significantly agitated.  She did not sleep.  She has been on modafinil previously, but I'm concerned that that may be affecting her sleep and agitation.  I am going to hold the modafinil for now.  She also has apparently already received her long-acting paliperidone injection and her next injection is due on 4/25.  I will change her Risperdal to 3 mg p.o. daily and 4 mg p.o. nightly.  I will also increase her lorazepam to 1 mg p.o. 3 times daily for catatonia.  No other changes to her medicines at this time.  1.  Continue Haldol 5 mg p.o./IM as needed agitation. 2.  Continue lorazepam 1 mg p.o. or 2 mg IM every 6 hours as needed anxiety or agitation, #3 increase lorazepam to 1 mg p.o. 3 times daily for catatonia. 4.  Increase Risperdal to 3 mg p.o. daily and 4 mg p.o. nightly for schizophrenia. 5.  Continue trazodone 150 mg p.o. nightly for insomnia. 6.  Hold modafinil for now. 7.  Change diet to diabetic diet. 8.  Disposition planning-in progress.  5/25, MD 01/10/2020, 11:04 AM

## 2020-01-10 NOTE — Progress Notes (Signed)
   01/10/20 1930  COVID-19 Daily Checkoff  Have you had a fever (temp > 37.80C/100F)  in the past 24 hours?  No  If you have had runny nose, nasal congestion, sneezing in the past 24 hours, has it worsened? No  COVID-19 EXPOSURE  Have you traveled outside the state in the past 14 days? No  Have you been in contact with someone with a confirmed diagnosis of COVID-19 or PUI in the past 14 days without wearing appropriate PPE? No  Have you been living in the same home as a person with confirmed diagnosis of COVID-19 or a PUI (household contact)? No

## 2020-01-10 NOTE — Progress Notes (Signed)
   01/10/20 0900  Psych Admission Type (Psych Patients Only)  Admission Status Involuntary  Psychosocial Assessment  Patient Complaints None  Eye Contact Brief  Facial Expression Blank  Affect Flat  Speech Elective mutism  Interaction Guarded;Minimal  Motor Activity Slow  Appearance/Hygiene In scrubs  Behavior Characteristics Unwilling to participate  Mood Depressed  Thought Process  Coherency Blocking  Content Preoccupation  Delusions None reported or observed  Perception UTA  Hallucination UTA  Judgment Poor  Confusion Mild  Danger to Self  Current suicidal ideation? Denies  Danger to Others  Danger to Others None reported or observed   Pt has been sitting in her room on her knees staring outside. She has selective mutism. Pt refused morning medication. With much encouragement she put her medication in her mouth and then spit it out in her water cup. She responds that she does not need medication. Pt is slow to respond and stares away when writer is talking with her as if having internal stimuli.  Pt denies si and hi.

## 2020-01-10 NOTE — Progress Notes (Signed)
Pt has been storming out of her room running on the hallway towards the double threatening to escape. Pt also observed in the room standing next the window and kept staring outside, at some point was seen seating in the bathroom floor. Pt has not slept much this shift, will continue to monitor.

## 2020-01-10 NOTE — [facility] Group Notes (Signed)
  [facility] LCSW Group Therapy Note  Date/Time:  01/10/2020 11:15AM-12:00PM  Type of Therapy and Topic:  Group Therapy:  Feelings About Hospitalization  Participation Level:  Minimal   Description of Group This process group involved patients discussing their feelings related to being hospitalized, as well as the benefits they see to being in the hospital.  These feelings and benefits were itemized.  The group then brainstormed specific ways in which they could seek those same benefits when they discharge and return home.  Therapeutic Goals 1. Patient will identify and describe positive and negative feelings related to hospitalization 2. Patient will verbalize benefits of hospitalization to themselves personally 3. Patients will brainstorm together ways they can obtain similar benefits in the outpatient setting, identify barriers to wellness and possible solutions  Summary of Patient Progress:  The patient engaged in initial check-in, sharing of feeling "sad". Pt left group following providing initial check-in response, to return towards the end of group discussion. Pt returned and engaged in discussion surrounding the abilities to obtain supports in an outpatient setting, to include psychiatrists and therapists. Pt did not provide any additional input. Pt proved receptive to feedback from group facilitator.  Therapeutic Modalities Cognitive Behavioral Therapy Motivational Interviewing    Lindsey Richmond 01/10/2020  12:31 PM

## 2020-01-10 NOTE — Progress Notes (Signed)
Adult Psychoeducational Group Note  Date:  01/10/2020 Time:  9:33 PM  Group Topic/Focus:  Wrap-Up Group:   The focus of this group is to help patients review their daily goal of treatment and discuss progress on daily workbooks.  Participation Level:  Minimal  Participation Quality:  Appropriate  Affect:  Appropriate  Cognitive:  Disorganized and Confused  Insight: Limited  Engagement in Group:  Limited  Modes of Intervention:  Discussion  Additional Comments:  Pt stated her goal for today was to focus on her treatment plan. Pt stated she accomplished her goal today. Pt stated her relationship with her family has improved since she was admitted. Pt stated been able to contact her mother today improved her day. Pt rated her overall day a 5 out of 10. Pt stated her appetite was pretty good today. Pt stated her sleep last night was pretty good. Pt stated she was in no physical pain today.  Pt deny auditory or visual hallucinations. Pt denies thoughts of harming herself or others. Pt stated she would alert staff if anything changes.  Felipa Furnace 01/10/2020, 9:33 PM

## 2020-01-10 NOTE — Progress Notes (Signed)
   01/10/20 1930  Psych Admission Type (Psych Patients Only)  Admission Status Involuntary  Psychosocial Assessment  Patient Complaints None  Eye Contact Brief  Facial Expression Blank  Affect Flat  Speech Soft;Logical/coherent  Interaction Guarded;Minimal  Motor Activity Slow  Appearance/Hygiene In hospital gown  Behavior Characteristics Appropriate to situation;Cooperative  Aggressive Behavior  Targets Self  Type of Behavior Weapon  Effect No apparent injury  Thought Process  Coherency Blocking  Content Preoccupation  Delusions None reported or observed  Perception UTA  Hallucination UTA  Judgment Poor  Confusion Mild  Danger to Self  Current suicidal ideation? Denies  Self-Injurious Behavior No self-injurious ideation or behavior indicators observed or expressed   Agreement Not to Harm Self Yes  Description of Agreement Verbally contracts for safety  Danger to Others  Danger to Others None reported or observed

## 2020-01-10 NOTE — Progress Notes (Signed)
Patient was cooperative and took hs medications. She was given gowns to change into so that her clothes could be washed and encouraged to shower. Writer spoke with her about the importance of taking her medications daily. She reported that she takes her medicine at home and started smiling. Safety maintained with 15 min checks.

## 2020-01-11 MED ORDER — LORAZEPAM 2 MG/ML IJ SOLN: 1.0000 mg | Freq: Three times a day (TID) | INTRAMUSCULAR | Status: DC

## 2020-01-11 MED ORDER — LORAZEPAM 1 MG PO TABS
1.0000 mg | ORAL_TABLET | Freq: Three times a day (TID) | ORAL | Status: DC
  Administered 2020-01-11 – 2020-01-12 (×3): 1 mg via ORAL
  Filled 2020-01-11 (×3): qty 1

## 2020-01-11 NOTE — [facility] Group Notes (Signed)
[facility] LCSW Group Therapy Note  Date/Time:  01/11/2020 11:15A-12:00P  Type of Therapy and Topic:  Group Therapy:  Healthy and Unhealthy Supports  Participation Level:  Active   Description of Group:  Patients in this group were introduced to the idea of adding a variety of healthy supports to address the various needs in their lives.Patients discussed what additional healthy supports could be helpful in their recovery and wellness after discharge in order to prevent future hospitalizations.   An emphasis was placed on using counselor, doctor, therapy groups, 12-step groups, and problem-specific support groups to expand supports.  They also worked as a group on developing a specific plan for several patients to deal with unhealthy supports through boundary-setting, psychoeducation with loved ones, and even termination of relationships.   Therapeutic Goals:   1)  discuss importance of adding supports to stay well once out of the hospital  2)  compare healthy versus unhealthy supports and identify some examples of each  3)  generate ideas and descriptions of healthy supports that can be added  4)  offer mutual support about how to address unhealthy supports  5)  encourage active participation in and adherence to discharge plan    Summary of Patient Progress:  The patient actively engaged in introduction check-in, sharing of feeling "alright, underwhelmed". Pt continued to engage in discussion of supports, sharing her definition to be "someone that loves you, and actually works with you". Pt stated that current healthy supports in her life are Jesus and prayer while not noting any unhealthy supports. Pt acknowledged alternate group members input and proved receptive to feedback given by CSW.   Therapeutic Modalities:   Motivational Interviewing Brief Solution-Focused Therapy  Leisa Lenz, LCSWA 01/11/2020  2:01 PM

## 2020-01-11 NOTE — Progress Notes (Signed)
Adult Psychoeducational Group Note  Date:  01/11/2020 Time:  9:59 PM  Group Topic/Focus:  Wrap-Up Group:   The focus of this group is to help patients review their daily goal of treatment and discuss progress on daily workbooks.  Participation Level:  Minimal  Participation Quality:  Appropriate  Affect:  Flat, Irritable and Labile  Cognitive:  Disorganized and Confused  Insight: Lacking and Limited  Engagement in Group:  Lacking and Limited  Modes of Intervention:  Discussion  Additional Comments:    Pt stated her goal for today was to focus on her treatment plan. Pt stated she accomplished her goal today. Pt stated her relationship with her family has improved since she was admitted. Pt stated been able to contact her mother today improved her day. Pt rated her overall day a 7 out of 10. Pt stated her appetite was improved today. Pt stated her sleep last night was pretty good. Pt stated she was in no physical pain today.  Pt deny auditory or visual hallucinations. Pt denies thoughts of harming herself or others. Pt stated she would alert staff if anything changes.  Felipa Furnace 01/11/2020, 9:59 PM

## 2020-01-11 NOTE — Progress Notes (Addendum)
Pt was cooperative and took her medications as ordered by the MD. Pt reported that her day went "well" and her appetite has been "good." Her motor activity is slow and her affect is flat. She is still showing some signs of catatonia, sometimes she stays mute while you talk to her as if she is preoccupied. She denies any SI/HI and auditory/visual hallucinations. She is still showing signs of responding to some internal stimuli.   Q15 minute safety checks continue.

## 2020-01-11 NOTE — Progress Notes (Signed)
   01/11/20 1930  COVID-19 Daily Checkoff  Have you had a fever (temp > 37.80C/100F)  in the past 24 hours?  No  COVID-19 EXPOSURE  Have you traveled outside the state in the past 14 days? No  Have you been in contact with someone with a confirmed diagnosis of COVID-19 or PUI in the past 14 days without wearing appropriate PPE? No  Have you been living in the same home as a person with confirmed diagnosis of COVID-19 or a PUI (household contact)? No  Have you been diagnosed with COVID-19? No

## 2020-01-11 NOTE — BHH Group Notes (Signed)
Adult Psychoeducational Group Note  Date:  01/11/2020 Time:  12:05 PM  Group Topic/Focus:   PROGRESSIVE RELAXATION.Marland Kitchen A group where deep breathing is taught and tensing and relaxation muscle groups is used. Imagery is used with both deep breathing and tensing and relaxing of the muscle groups. Pt;s are asked to imagine 3 pillars that hold them up when they are not able to hold themselves up.  Participation Level:  Active  Participation Quality:  Appropriate  Affect:  Appropriate  Cognitive:  Alert  Insight: Lacking  Engagement in Group:  Engaged slightly  Modes of Intervention:  Activity  Additional Comments:  Pt  Stated her energy level was a 7 and her support systems are herself and her faith. Listened to the progressive relaxation, but did not fully participate.  Lindsey Richmond A 01/11/2020, 12:05 PM

## 2020-01-11 NOTE — Progress Notes (Signed)
[facility] MD Progress Note  01/11/2020 9:28 AM Lindsey Richmond  MRN:  [id number] Subjective:  Patient is a 24 year old female who was readmitted to the hospital on 01/06/20 secondary to worsening symptoms of schizophrenia including hyper religiosity.  The patient had been brought by emergency medical services to the behavioral health hospital after having been found [place].  Objective: Patient is seen and examined.  Patient is a 24 year old female with the above-stated past psychiatric history who is seen in follow-up.  She is actually a little bit better today.  She was waiting at the office door when I walked on the unit, and wanted to speak with me.  She wanted to know when she would be able to be discharged.  We discussed the fact that she had spit some of her medicines out yesterday, and that she would have to take her medicines on a regular basis without having to have an injection.  She is still with a very odd affect, and smiles at inappropriate times.  She denied any auditory hallucinations today, but she does still appear to be responding to internal stimuli.  Her Risperdal was increased yesterday to 3 mg p.o. daily and 4 mg p.o. nightly.  Additionally her lorazepam was increased to standing 1 mg p.o. 3 times daily for her catatonia.  Her blood pressure stable.  Initially her pulse was stable at 89, but repeat was at 116.  She is afebrile.  She only slept 3.75 hours again last night.  No new laboratories.  Principal Problem: <principal problem not specified> Diagnosis: Active Problems:   Schizophrenia (HCC)   Schizoaffective disorder, bipolar type (HCC)  Total Time spent with patient: 20 minutes  Past Psychiatric History: Admission H&P  Past Medical History:  Past Medical History:  Diagnosis Date  . Depression   . Oppositional defiant behavior   . Schizo affective schizophrenia (HCC)   . Schizophrenia (HCC)    History reviewed. No pertinent surgical history. Family  History:  Family History  Problem Relation Age of Onset  . Heart failure Mother   . Hypertension Mother   . Diabetes Father    Family Psychiatric  History: See admission H&P Social History:  Social History   Substance and Sexual Activity  Alcohol Use No     Social History   Substance and Sexual Activity  Drug Use No    Social History   Socioeconomic History  . Marital status: Single    Spouse name: Not on file  . Number of children: Not on file  . Years of education: Not on file  . Highest education level: Not on file  Occupational History  . Occupation: Unemployed  Tobacco Use  . Smoking status: Never Smoker  . Smokeless tobacco: Never Used  Substance and Sexual Activity  . Alcohol use: No  . Drug use: No  . Sexual activity: Never    Birth control/protection: None  Other Topics Concern  . Not on file  Social History Narrative   ** Merged History Encounter **       Pt lives with her mother, brother, and father.  She receives outpatient psychiatric care through [facility].   Social Determinants of Health   Financial Resource Strain:   . Difficulty of Paying Living Expenses:   Food Insecurity:   . Worried About Programme researcher, broadcasting/film/video in the Last Year:   . [employer] of Food in the Last Year:   Transportation Needs:   . Lack of  Transportation (Medical):   Marland Kitchen Lack of Transportation (Non-Medical):   Physical Activity:   . Days of Exercise per Week:   . Minutes of Exercise per Session:   Stress:   . Feeling of Stress :   Social Connections:   . Frequency of Communication with Friends and Family:   . Frequency of Social Gatherings with Friends and Family:   . Attends Religious Services:   . Active Member of Clubs or Organizations:   . Attends Archivist Meetings:   Marland Kitchen Marital Status:    Additional Social History:                         Sleep: Poor  Appetite:  Good  Current Medications: Current Facility-Administered Medications  Medication  Dose Route Frequency Provider Last Rate Last Admin  . benztropine mesylate (COGENTIN) injection 0.5 mg  0.5 mg Intramuscular BID PRN Rankin, Shuvon B, NP      . haloperidol (HALDOL) tablet 5 mg  5 mg Oral Q8H PRN Johnn Hai, MD       Or  . haloperidol lactate (HALDOL) injection 5 mg  5 mg Intramuscular Q6H PRN Johnn Hai, MD      . LORazepam (ATIVAN) tablet 1 mg  1 mg Oral Q6H PRN Johnn Hai, MD   1 mg at 01/10/20 0059   Or  . LORazepam (ATIVAN) injection 2 mg  2 mg Intramuscular Q6H PRN Johnn Hai, MD      . LORazepam (ATIVAN) tablet 1 mg  1 mg Oral TID Sharma Covert, MD   1 mg at 01/11/20 0754  . risperiDONE (RISPERDAL M-TABS) disintegrating tablet 4 mg  4 mg Oral QHS Sharma Covert, MD   4 mg at 01/10/20 2045  . risperiDONE (RISPERDAL) tablet 3 mg  3 mg Oral Daily Sharma Covert, MD   3 mg at 01/11/20 0754  . traZODone (DESYREL) tablet 150 mg  150 mg Oral QHS Rankin, Shuvon B, NP   150 mg at 01/10/20 2045    Lab Results: No results found for this or any previous visit (from the past 48 hour(s)).  Blood Alcohol level:  Lab Results  Component Value Date   ETH <10 01/04/2020   ETH <10 54/65/6812    Metabolic Disorder Labs: Lab Results  Component Value Date   HGBA1C 5.6 12/27/2019   MPG 114.02 12/27/2019   MPG 105.41 12/04/2019   Lab Results  Component Value Date   PROLACTIN 57.7 (H) 12/04/2019   PROLACTIN 75.7 (H) 10/23/2019   Lab Results  Component Value Date   CHOL 145 12/27/2019   TRIG 59 12/27/2019   HDL 24 (L) 12/27/2019   CHOLHDL 6.0 12/27/2019   VLDL 12 12/27/2019   LDLCALC 109 (H) 12/27/2019   LDLCALC 100 (H) 12/04/2019    Physical Findings: AIMS: Facial and Oral Movements Muscles of Facial Expression: None, normal Lips and Perioral Area: None, normal Jaw: None, normal Tongue: None, normal,Extremity Movements Upper (arms, wrists, hands, fingers): None, normal Lower (legs, knees, ankles, toes): None, normal, Trunk Movements Neck,  shoulders, hips: None, normal, Overall Severity Severity of abnormal movements (highest score from questions above): None, normal Incapacitation due to abnormal movements: None, normal Patient's awareness of abnormal movements (rate only patient's report): No Awareness, Dental Status Current problems with teeth and/or dentures?: No Does patient usually wear dentures?: No  CIWA:    COWS:  COWS Total Score: 1  Musculoskeletal: Strength & Muscle Tone: within normal  limits Gait & Station: normal Patient leans: N/A  Psychiatric Specialty Exam: Physical Exam  Nursing note and vitals reviewed. Constitutional: She is oriented to person, place, and time. She appears well-developed and well-nourished.  HENT:  Head: Normocephalic and atraumatic.  Respiratory: Effort normal.  Neurological: She is alert and oriented to person, place, and time.    Review of Systems  Blood pressure 104/74, pulse (!) 116, temperature 98.3 F (36.8 C), temperature source Oral, resp. rate 20, height 5\' 4"  (1.626 m), weight 115.2 kg, last menstrual period 12/26/2019, SpO2 94 %.Body mass index is 43.6 kg/m.  General Appearance: Disheveled  Eye Contact:  Fair  Speech:  Slow  Volume:  Normal  Mood:  Dysphoric  Affect:  Inappropriate  Thought Process:  Goal Directed and Descriptions of Associations: Loose  Orientation:  Full (Time, Place, and Person)  Thought Content:  Delusions and Hallucinations: Auditory  Suicidal Thoughts:  No  Homicidal Thoughts:  No  Memory:  Immediate;   Poor Recent;   Poor Remote;   Poor  Judgement:  Impaired  Insight:  Fair  Psychomotor Activity:  Decreased  Concentration:  Concentration: Fair and Attention Span: Fair  Recall:  12/28/2019 of Knowledge:  Fair  Language:  Fair  Akathisia:  Negative  Handed:  Right  AIMS (if indicated):     Assets:  Desire for Improvement Resilience  ADL's:  Impaired  Cognition:  WNL  Sleep:  Number of Hours: 3.75     Treatment Plan  Summary: Daily contact with patient to assess and evaluate symptoms and progress in treatment, Medication management and Plan : Patient is seen and examined.  Patient is a 24 year old female with the above-stated past psychiatric history who is seen in follow-up.   Diagnosis: #1 schizophrenia, #2 borderline diabetes type 2  Patient is seen in follow-up.  She is more verbal today, less catatonic, and denies auditory hallucinations.  She her affect is still very odd, and it does appear that she is responding to internal stimuli but less so than she has been.  She is still not sleeping well.  I will confirm whether or not she got the trazodone at 150 mg p.o. nightly with the 3 mg Risperdal daily and 4 mg p.o. nightly.  If she did take it and still not sleeping I may increase the dosage to 200 mg p.o. nightly.  We will attempt to get an EKG on her today.  I will also order daily Accu-Cheks just to make sure what her blood sugars doing.  I will continue to hold the modafinil.  Hopefully her long-acting injectable will kick in more fully soon.  1.  Continue Haldol 5 mg p.o./IM as needed agitation. 2.  Continue lorazepam 1 mg p.o. or 2 mg IM every 6 hours as needed anxiety or agitation, #3 increase lorazepam to 1 mg p.o. 3 times daily for catatonia. 4.  Continue Risperdal to 3 mg p.o. daily and 4 mg p.o. nightly for schizophrenia. 5.  Continue trazodone 150 mg p.o. nightly for insomnia. 6.  Continue to hold modafinil for now. 7.  Diet to remain diabetic diet. 8.   Add daily Accu-Cheks. 9.  Disposition planning-in progress.  38, MD 01/11/2020, 9:28 AM

## 2020-01-12 LAB — GLUCOSE, CAPILLARY: Glucose-Capillary: 163 mg/dL — ABNORMAL HIGH (ref 70–99)

## 2020-01-12 MED ORDER — CLONIDINE HCL 0.2 MG PO TABS: 0.2000 mg | ORAL_TABLET | Freq: Two times a day (BID) | ORAL | 2 refills | Status: DC

## 2020-01-12 MED ORDER — RISPERIDONE 3 MG PO TABS: 6.0000 mg | ORAL_TABLET | Freq: Every day | ORAL | 3 refills | Status: DC

## 2020-01-12 MED ORDER — INVEGA SUSTENNA 156 MG/ML IM SUSY: 156.0000 mg | PREFILLED_SYRINGE | INTRAMUSCULAR | 11 refills | Status: DC

## 2020-01-12 MED ORDER — BENZTROPINE MESYLATE 0.5 MG PO TABS: 0.5000 mg | ORAL_TABLET | Freq: Two times a day (BID) | ORAL | 2 refills | Status: DC | PRN

## 2020-01-12 NOTE — Discharge Summary (Signed)
Physician Discharge Summary Note  Patient:  Lindsey Richmond is an 24 y.o., female MRN:  [id number] DOB:  19-Nov-1995 Patient phone:  [phone] (home)  Patient address:   [address] [place] [address],  Total Time spent with patient: 45 minutes  Date of Admission:  01/05/2020 Date of Discharge: 01/12/2020  Reason for Admission:   History of Present Illness: Ms. Sarchet is a 24 year old female with history of schizophrenia. She was recently discharged from our unit on 12/25/19 after admission for similar presentation. She was discharged on Invega Sustenna, Risperdal 3 mg QHS, Cogentin 0.5 mg BID, Provigil 200 mg daily, and trazodone 150 mg QHS. She had an admission here 12/03/19-12/09/19 as well. She returned to the ED on 01/04/20, brought in by EMS after being found hanging onto a cross [place] a [place]. Patient received Gean Birchwood 234 mg during recent admission but admits that she was not taking oral medications since discharge. She is hyperreligious on assessment, stating that she was brought to the hospital because "I believe in the truth because I've been called and I've been reading the Bible." She states she is spreading the truth of Jesus Christ. She states that her brother wanted her to come back to the hospital. She is intermittently mute with soft speech, no eye contact, and difficult to hear at times. She presents with flat affect and provides vague answers, poverty of content, and appears to have some thought blocking. She denies SI/HI/AVH and shows no signs of responding to internal stimuli. Per nursing report she has been speaking to herself alone in her room. Per brother's report, patient has been violent and angry at home. She has been isolating to her room since admission yesterday. Principal Problem: psychosis Discharge Diagnoses: Active Problems:   Schizophrenia (HCC)   Schizoaffective disorder, bipolar type (HCC)   Past Psychiatric History: see eval  Past Medical History:  Past  Medical History:  Diagnosis Date  . Depression   . Oppositional defiant behavior   . Schizo affective schizophrenia (HCC)   . Schizophrenia (HCC)    History reviewed. No pertinent surgical history. Family History:  Family History  Problem Relation Age of Onset  . Heart failure Mother   . Hypertension Mother   . Diabetes Father    Family Psychiatric  History: see eval Social History:  Social History   Substance and Sexual Activity  Alcohol Use No     Social History   Substance and Sexual Activity  Drug Use No    Social History   Socioeconomic History  . Marital status: Single    Spouse name: Not on file  . Number of children: Not on file  . Years of education: Not on file  . Highest education level: Not on file  Occupational History  . Occupation: Unemployed  Tobacco Use  . Smoking status: Never Smoker  . Smokeless tobacco: Never Used  Substance and Sexual Activity  . Alcohol use: No  . Drug use: No  . Sexual activity: Never    Birth control/protection: None  Other Topics Concern  . Not on file  Social History Narrative   ** Merged History Encounter **       Pt lives with her mother, brother, and father.  She [facility].   Social Determinants of Health   Financial Resource Strain:   . Difficulty of Paying Living Expenses:   Food Insecurity:   . Worried About Programme researcher, broadcasting/film/video in the Last Year:   .  Ran Out of Food in the Last Year:   Transportation Needs:   . Freight forwarder (Medical):   Marland Kitchen Lack of Transportation (Non-Medical):   Physical Activity:   . Days of Exercise per Week:   . Minutes of Exercise per Session:   Stress:   . Feeling of Stress :   Social Connections:   . Frequency of Communication with Friends and Family:   . Frequency of Social Gatherings with Friends and Family:   . Attends Religious Services:   . Active Member of Clubs or Organizations:   . Attends Banker  Meetings:   Marland Kitchen Marital Status:     Hospital Course:   Patient was admitted under routine precautions and during her stay had a slow progress, she was intermittently low to process but did not progress to thought blocking.  She did eventually have a resolution in her psychosis, the bizarre behaviors resolved fairly quickly, then it was a matter of dealing with negative symptoms and processing speed as well as hallucinations but again the positive symptoms resolved fairly quickly with the reinstitution of Risperdal.  We also used modafinil and lorazepam for her catatonic features and by the date of the 12th we felt she had achieved her baseline status.  She was alert and oriented and cooperative, denied auditory or visual hallucinations was conversant, no EPS or TD.  She will remain on paliperidone long-acting injectable and oral Risperdal  Physical Findings: AIMS: Facial and Oral Movements Muscles of Facial Expression: None, normal Lips and Perioral Area: None, normal Jaw: None, normal Tongue: None, normal,Extremity Movements Upper (arms, wrists, hands, fingers): None, normal Lower (legs, knees, ankles, toes): None, normal, Trunk Movements Neck, shoulders, hips: None, normal, Overall Severity Severity of abnormal movements (highest score from questions above): None, normal Incapacitation due to abnormal movements: None, normal Patient's awareness of abnormal movements (rate only patient's report): No Awareness, Dental Status Current problems with teeth and/or dentures?: No Does patient usually wear dentures?: No  CIWA:    COWS:  COWS Total Score: 1  Musculoskeletal: Strength & Muscle Tone: within normal limits Gait & Station: normal Patient leans: N/A  Psychiatric Specialty Exam: Physical Exam  Review of Systems  Blood pressure (!) 143/88, pulse (!) 112, temperature 98.3 F (36.8 C), temperature source Oral, resp. rate 20, height 5\' 4"  (1.626 m), weight 115.2 kg, last menstrual  period 12/26/2019, SpO2 98 %.Body mass index is 43.6 kg/m.  General Appearance: Casual  Eye Contact:  Good  Speech:  Clear and Coherent  Volume:  Decreased  Mood:  Euthymic  Affect:  Blunt and Congruent  Thought Process:  Coherent and Goal Directed  Orientation:  Full (Time, Place, and Person)  Thought Content:  Logical  Suicidal Thoughts:  No  Homicidal Thoughts:  No  Memory:  Immediate;   Fair Recent;   Fair Remote;   Fair  Judgement:  Fair  Insight:  Fair  Psychomotor Activity:  EPS - neg  Concentration:  Concentration: Fair and Attention Span: Fair  Recall:  12/28/2019 of Knowledge:  Fair  Language:  Fair  Akathisia:  Negative  Handed:  Right  AIMS (if indicated):     Assets:  Communication Skills Physical Health Resilience  ADL's:  Intact  Cognition:  WNL  Sleep:  Number of Hours: 6.75        Has this patient used any form of tobacco in the last 30 days? (Cigarettes, Smokeless Tobacco, Cigars, and/or Pipes) Yes, No  Blood Alcohol  level:  Lab Results  Component Value Date   ETH <10 01/04/2020   ETH <10 59/16/3846    Metabolic Disorder Labs:  Lab Results  Component Value Date   HGBA1C 5.6 12/27/2019   MPG 114.02 12/27/2019   MPG 105.41 12/04/2019   Lab Results  Component Value Date   PROLACTIN 57.7 (H) 12/04/2019   PROLACTIN 75.7 (H) 10/23/2019   Lab Results  Component Value Date   CHOL 145 12/27/2019   TRIG 59 12/27/2019   HDL 24 (L) 12/27/2019   CHOLHDL 6.0 12/27/2019   VLDL 12 12/27/2019   LDLCALC 109 (H) 12/27/2019   LDLCALC 100 (H) 12/04/2019    See Psychiatric Specialty Exam and Suicide Risk Assessment completed by Attending Physician prior to discharge.  Discharge destination:  Home  Is patient on multiple antipsychotic therapies at discharge:  No   Has Patient had three or more failed trials of antipsychotic monotherapy by history:  No  Recommended Plan for Multiple Antipsychotic Therapies: NA   Allergies as of 01/12/2020   No  Known Allergies     Medication List    STOP taking these medications   modafinil 200 MG tablet Commonly known as: PROVIGIL     TAKE these medications     Indication  benztropine 0.5 MG tablet Commonly known as: COGENTIN Take 1 tablet (0.5 mg total) by mouth 2 (two) times daily as needed for tremors (EPS). What changed: Another medication with the same name was removed. Continue taking this medication, and follow the directions you see here.  Indication: Extrapyramidal Reaction caused by Medications   cloNIDine 0.2 MG tablet Commonly known as: CATAPRES Take 1 tablet (0.2 mg total) by mouth 2 (two) times daily. What changed:   medication strength  how much to take  Another medication with the same name was removed. Continue taking this medication, and follow the directions you see here.  Indication: High Blood Pressure Disorder   Invega Sustenna 156 MG/ML Susy injection Generic drug: paliperidone Inject 1 mL (156 mg total) into the muscle every 30 (thirty) days.  Indication: Schizoaffective Disorder   risperiDONE 3 MG tablet Commonly known as: RISPERDAL Take 2 tablets (6 mg total) by mouth at bedtime. What changed: how much to take  Indication: MIXED BIPOLAR AFFECTIVE DISORDER   traZODone 150 MG tablet Commonly known as: DESYREL Take 150 mg by mouth at bedtime. What changed: Another medication with the same name was removed. Continue taking this medication, and follow the directions you see here.  Indication: Trouble Sleeping      Follow-up Information    [facility] Follow up on 01/20/2020.   Why: You are scheduled for an appointment on 01/20/20 at 9:45 am with the nurse and an appointment at 1:15 pm the same day for medication management.  [facility] will be virtual tele-health appointments.  Contact information: [address] [place] [address] [phone]           Follow-up recommendations:  Activity:  full  SignedJohnn Hai, MD 01/12/2020, 7:42  AM

## 2020-01-12 NOTE — Progress Notes (Signed)
Recreation Therapy Notes  Date: 4.12.21 Time: 1000 Location: [address] Dayroom  Group Topic: Coping Skills  Goal Area(s) Addresses:  Patient will identify positive coping skills. Patient will identify benefit of using coping skills post d/c.  Behavioral Response: Engaged  Intervention: Game  Activity: Chartered loss adjuster.  LRT and patients played Jenga as the instructions described, in addition, patients were to identify a positive coping skill.  Patients and LRT could not repeat coping skills that had already been identify.  When the tower fell over, the game would restart and all coping skills were up for grabs.  Education: Pharmacologist, Building control surveyor.   Education Outcome: Acknowledges understanding/In group clarification offered/Needs additional education.   Clinical Observations/Feedback: Pt was smiling and active during activity.  Pt was also excited about discharging.  Pt identified coping skills as going to the park, talk to friends, watch anime and reading magazines.  Pt also expressed using positive coping skills can prevent a situation from getting worse.    Caroll Rancher, LRT/CTRS         Caroll Rancher A 01/12/2020 11:43 AM

## 2020-01-12 NOTE — Progress Notes (Signed)
Pt's blood glucose is 163, was taken after she was done eating.

## 2020-01-12 NOTE — [facility] Suicide Risk Assessment (Signed)
[facility] Discharge Suicide Risk Assessment   Principal Problem: psychosis Discharge Diagnoses: Active Problems:   Schizophrenia (HCC)   Schizoaffective disorder, bipolar type (HCC)   Total Time spent with patient: 45 minutes Musculoskeletal: Strength & Muscle Tone: within normal limits Gait & Station: normal Patient leans: N/A  Psychiatric Specialty Exam: Physical Exam  Review of Systems  Blood pressure (!) 143/88, pulse (!) 112, temperature 98.3 F (36.8 C), temperature source Oral, resp. rate 20, height 5\' 4"  (1.626 m), weight 115.2 kg, last menstrual period 12/26/2019, SpO2 98 %.Body mass index is 43.6 kg/m.  General Appearance: Casual  Eye Contact:  Good  Speech:  Clear and Coherent  Volume:  Decreased  Mood:  Euthymic  Affect:  Blunt and Congruent  Thought Process:  Coherent and Goal Directed  Orientation:  Full (Time, Place, and Person)  Thought Content:  Logical  Suicidal Thoughts:  No  Homicidal Thoughts:  No  Memory:  Immediate;   Fair Recent;   Fair Remote;   Fair  Judgement:  Fair  Insight:  Fair  Psychomotor Activity:  EPS - neg  Concentration:  Concentration: Fair and Attention Span: Fair  Recall:  12/28/2019 of Knowledge:  Fair  Language:  Fair  Akathisia:  Negative  Handed:  Right  AIMS (if indicated):     Assets:  Communication Skills Physical Health Resilience  ADL's:  Intact  Cognition:  WNL  Sleep:  Number of Hours: 6.75  Mental Status Per Nursing Assessment::   On Admission:  NA  Demographic Factors:  Unemployed  Loss Factors: Decrease in vocational status  Historical Factors: NA  Risk Reduction Factors:   Sense of responsibility to family and Religious beliefs about death  Continued Clinical Symptoms:  Schizophrenia:   Paranoid or undifferentiated type  Cognitive Features That Contribute To Risk:  Loss of executive function    Suicide Risk:  Minimal: No identifiable suicidal ideation.  Patients presenting with no risk factors  but with morbid ruminations; may be classified as minimal risk based on the severity of the depressive symptoms  Follow-up Information    [facility] Follow up on 01/20/2020.   Why: You are scheduled for an appointment on 01/20/20 at 9:45 am with the nurse and an appointment at 1:15 pm the same day for medication management.  [facility] will be virtual tele-health appointments.  Contact information: [address] [place] [phone]           Plan Of Care/Follow-up recommendations:  Activity:  full  Fahad Cisse, MD 01/12/2020, 7:46 AM

## 2020-01-12 NOTE — Plan of Care (Signed)
Discharge note  Patient verbalizes readiness for discharge. Follow up plan explained, AVS, Transition record and SRA given. Prescriptions and teaching provided. Belongings returned and signed for. Suicide safety plan completed and signed. Patient verbalizes understanding. Patient denies SI/HI and assures this Clinical research associate they will seek assistance should that change. Patient discharged to lobby to wait for Lyft.  Problem: Education: Goal: Knowledge of [facility] Education information/materials will improve Outcome: Adequate for Discharge Goal: Emotional status will improve Outcome: Adequate for Discharge Goal: Mental status will improve Outcome: Adequate for Discharge Goal: Verbalization of understanding the information provided will improve Outcome: Adequate for Discharge   Problem: Coping: Goal: Coping ability will improve Outcome: Adequate for Discharge Goal: Will verbalize feelings Outcome: Adequate for Discharge   Problem: Health Behavior/Discharge Planning: Goal: Compliance with prescribed medication regimen will improve Outcome: Adequate for Discharge   Problem: Nutritional: Goal: Ability to achieve adequate nutritional intake will improve Outcome: Adequate for Discharge   Problem: Activity: Goal: Interest or engagement in leisure activities will improve Outcome: Adequate for Discharge Goal: Imbalance in normal sleep/wake cycle will improve Outcome: Adequate for Discharge   Problem: Coping: Goal: Coping ability will improve Outcome: Adequate for Discharge Goal: Will verbalize feelings Outcome: Adequate for Discharge

## 2020-01-12 NOTE — Progress Notes (Signed)
Recreation Therapy Notes  INPATIENT RECREATION TR PLAN  Patient Details Name: Lindsey Richmond MRN: [id number] DOB: Apr 06, 1996 Today's Date: 01/12/2020  Rec Therapy Plan Is patient appropriate for Therapeutic Recreation?: Yes Treatment times per week: about 3 days Estimated Length of Stay: 5-7 days TR Treatment/Interventions: Group participation (Comment)  Discharge Criteria Pt will be discharged from therapy if:: Discharged Treatment plan/goals/alternatives discussed and agreed upon by:: Patient/family  Discharge Summary Short term goals set: See patient care plan Short term goals met: Adequate for discharge Progress toward goals comments: Groups attended Which groups?: Coping skills Reason goals not met: Pt attended one group session. Therapeutic equipment acquired: N/A Reason patient discharged from therapy: Discharge from hospital Pt/family agrees with progress & goals achieved: Yes Date patient discharged from therapy: 01/12/20    Victorino Sparrow, LRT/CTRS  [name], Rachal Dvorsky A 01/12/2020, 11:49 AM

## 2020-01-12 NOTE — Tx Team (Signed)
Interdisciplinary Treatment and Diagnostic Plan Update  01/12/2020 Time of Session: 9:00am Lindsey Richmond MRN: [id number]  Principal Diagnosis: <principal problem not specified>  Secondary Diagnoses: Active Problems:   Schizophrenia (HCC)   Schizoaffective disorder, bipolar type (HCC)   Current Medications:  Current Facility-Administered Medications  Medication Dose Route Frequency Provider Last Rate Last Admin  . benztropine mesylate (COGENTIN) injection 0.5 mg  0.5 mg Intramuscular BID PRN Rankin, Shuvon B, NP      . haloperidol (HALDOL) tablet 5 mg  5 mg Oral Q8H PRN Malvin Johns, MD       Or  . haloperidol lactate (HALDOL) injection 5 mg  5 mg Intramuscular Q6H PRN Malvin Johns, MD      . LORazepam (ATIVAN) tablet 1 mg  1 mg Oral TID Malvin Johns, MD   1 mg at 01/12/20 6213   Or  . LORazepam (ATIVAN) injection 1 mg  1 mg Intramuscular TID Malvin Johns, MD      . LORazepam (ATIVAN) tablet 1 mg  1 mg Oral Q6H PRN Malvin Johns, MD   1 mg at 01/10/20 0059   Or  . LORazepam (ATIVAN) injection 2 mg  2 mg Intramuscular Q6H PRN Malvin Johns, MD      . risperiDONE (RISPERDAL M-TABS) disintegrating tablet 4 mg  4 mg Oral QHS Antonieta Pert, MD   4 mg at 01/11/20 2104  . risperiDONE (RISPERDAL) tablet 3 mg  3 mg Oral Daily Antonieta Pert, MD   3 mg at 01/12/20 0865  . traZODone (DESYREL) tablet 150 mg  150 mg Oral QHS Rankin, Shuvon B, NP   150 mg at 01/11/20 2105   PTA Medications: Medications Prior to Admission  Medication Sig Dispense Refill Last Dose  . benztropine (COGENTIN) 0.5 MG tablet Take 1 tablet (0.5 mg total) by mouth 2 (two) times daily. (Patient not taking: Reported on 01/05/2020) 180 tablet 1   . cloNIDine (CATAPRES) 0.1 MG tablet Take 1 tablet (0.1 mg total) by mouth 2 (two) times daily. (Patient not taking: Reported on 01/05/2020) 60 tablet 2   . modafinil (PROVIGIL) 200 MG tablet Take 1 tablet (200 mg total) by mouth daily. 7 tablet 0   . risperiDONE (RISPERDAL) 3 MG  tablet Take 1 tablet (3 mg total) by mouth at bedtime. 30 tablet 2   . traZODone (DESYREL) 150 MG tablet Take 1 tablet (150 mg total) by mouth at bedtime as needed for sleep. (Patient not taking: Reported on 01/05/2020) 90 tablet 1   . traZODone (DESYREL) 150 MG tablet Take 150 mg by mouth at bedtime.     . [DISCONTINUED] benztropine (COGENTIN) 0.5 MG tablet Take 1 tablet (0.5 mg total) by mouth 2 (two) times daily as needed for tremors (EPS). 60 tablet 2   . [DISCONTINUED] cloNIDine (CATAPRES) 0.1 MG tablet Take 0.1 mg by mouth 2 (two) times daily.     . [DISCONTINUED] paliperidone (INVEGA SUSTENNA) 156 MG/ML SUSY injection Inject 156 mg into the muscle every 30 (thirty) days.       Patient Stressors:    Patient Strengths:    Treatment Modalities: Medication Management, Group therapy, Case management,  1 to 1 session with clinician, Psychoeducation, Recreational therapy.   Physician Treatment Plan for Primary Diagnosis: <principal problem not specified> Long Term Goal(s): Improvement in symptoms so as ready for discharge Improvement in symptoms so as ready for discharge   Short Term Goals: Ability to identify changes in lifestyle to reduce recurrence of condition will improve Ability  to verbalize feelings will improve Ability to disclose and discuss suicidal ideas Ability to demonstrate self-control will improve Ability to identify and develop effective coping behaviors will improve  Medication Management: Evaluate patient's response, side effects, and tolerance of medication regimen.  Therapeutic Interventions: 1 to 1 sessions, Unit Group sessions and Medication administration.  Evaluation of Outcomes: Adequate for Discharge  Physician Treatment Plan for Secondary Diagnosis: Active Problems:   Schizophrenia (HCC)   Schizoaffective disorder, bipolar type (HCC)  Long Term Goal(s): Improvement in symptoms so as ready for discharge Improvement in symptoms so as ready for discharge    Short Term Goals: Ability to identify changes in lifestyle to reduce recurrence of condition will improve Ability to verbalize feelings will improve Ability to disclose and discuss suicidal ideas Ability to demonstrate self-control will improve Ability to identify and develop effective coping behaviors will improve     Medication Management: Evaluate patient's response, side effects, and tolerance of medication regimen.  Therapeutic Interventions: 1 to 1 sessions, Unit Group sessions and Medication administration.  Evaluation of Outcomes: Adequate for Discharge   RN Treatment Plan for Primary Diagnosis: <principal problem not specified> Long Term Goal(s): Knowledge of disease and therapeutic regimen to maintain health will improve  Short Term Goals: Ability to identify and develop effective coping behaviors will improve and Compliance with prescribed medications will improve  Medication Management: RN will administer medications as ordered by provider, will assess and evaluate patient's response and provide education to patient for prescribed medication. RN will report any adverse and/or side effects to prescribing provider.  Therapeutic Interventions: 1 on 1 counseling sessions, Psychoeducation, Medication administration, Evaluate responses to treatment, Monitor vital signs and CBGs as ordered, Perform/monitor CIWA, COWS, AIMS and Fall Risk screenings as ordered, Perform wound care treatments as ordered.  Evaluation of Outcomes: Adequate for Discharge   LCSW Treatment Plan for Primary Diagnosis: <principal problem not specified> Long Term Goal(s): Safe transition to appropriate next level of care at discharge, Engage patient in therapeutic group addressing interpersonal concerns.  Short Term Goals: Engage patient in aftercare planning with referrals and resources, Increase social support, Facilitate acceptance of mental health diagnosis and concerns, Identify triggers associated with  mental health/substance abuse issues and Increase skills for wellness and recovery  Therapeutic Interventions: Assess for all discharge needs, 1 to 1 time with Social worker, Explore available resources and support systems, Assess for adequacy in community support network, Educate family and significant other(s) on suicide prevention, Complete Psychosocial Assessment, Interpersonal group therapy.  Evaluation of Outcomes: Adequate for Discharge  Progress in Treatment: Attending groups: Yes. Participating in groups: Yes. Taking medication as prescribed: Yes. Toleration medication: Yes. Family/Significant other contact made: No, will contact:  patient declined consents to speak with family. Patient understands diagnosis: Yes. Discussing patient identified problems/goals with staff: No. Medical problems stabilized or resolved: Yes. Denies suicidal/homicidal ideation: Yes. Issues/concerns per patient self-inventory: Yes.  New problem(s) identified: Yes, Describe:  medication non-compliance, no insurance.  New Short Term/Long Term Goal(s): medication management for mood stabilization; elimination of SI thoughts; development of comprehensive mental wellness/sobriety plan.  Patient Goals:  Patient was invited by CSW and MHT to attend treatment team, patient declined to participate.  Discharge Plan or Barriers: Patient would benefit from ACTT services, but does not have Medicaid. Patient will return home and continue outpatient follow up with [facility], her appointment is on 04/20.  Reason for Continuation of Hospitalization: Anxiety Delusions  Hallucinations Medication stabilization  Estimated Length of Stay: discharging today  Attendees: Patient:  01/12/2020 10:33 AM  Physician: Dr.Farah  01/12/2020 10:33 AM  Nursing:  01/12/2020 10:33 AM  RN Care Manager: 01/12/2020 10:33 AM  Social Worker: Stephanie Acre, [place] 01/12/2020 10:33 AM  Recreational Therapist:  01/12/2020 10:33 AM  Other:   01/12/2020 10:33 AM  Other:  01/12/2020 10:33 AM  Other: 01/12/2020 10:33 AM    Scribe for Treatment Team: Joellen Jersey, [place] 01/12/2020 10:33 AM

## 2020-01-12 NOTE — Plan of Care (Signed)
Pt spontaneously contributed to one group discussion at completion of recreation therapy group sessions.   Lindsey Richmond, LRT/CTRS 

## 2020-01-12 NOTE — Progress Notes (Signed)
  [facility] Adult Case Management Discharge Plan :  Will you be returning to the same living situation after discharge:  Yes,  home. At discharge, do you have transportation home?: Yes,  will use [employer]. Do you have the ability to pay for your medications: No. Following up with [facility].   Release of information consent forms completed and in the chart.  Patient to Follow up at: Follow-up Information    [facility] Follow up on 01/20/2020.   Why: You are scheduled for an appointment on 01/20/20 at 9:45 am with the nurse and an appointment at 1:15 pm the same day for medication management.  [facility] will be virtual tele-health appointments.  Contact information: [address] [place] [address] [phone]           Next level of care provider has access to [facility] Link:no  Safety Planning and Suicide Prevention discussed: Yes,  with patient. Patient declined consents.  Has patient been referred to the Quitline?: N/A patient is not a smoker  Patient has been referred for addiction treatment: Yes  Darreld Mclean, LCSWA 01/12/2020, 9:19 AM

## 2020-03-08 ENCOUNTER — Telehealth (HOSPITAL_COMMUNITY): Payer: Federal, State, Local not specified - Other | Admitting: Psychiatry

## 2020-06-06 IMAGING — CT CT HEAD W/O CM
4 series · 16 of 47 positions shown, 18 images · non-contrast
Comparison: None.

CLINICAL DATA: Unwitnessed fall with head and neck trauma.

EXAM:
CT HEAD WITHOUT CONTRAST
CT CERVICAL SPINE WITHOUT CONTRAST
TECHNIQUE: Multidetector CT imaging of the head and cervical spine was
performed following the standard protocol without intravenous
contrast. Multiplanar CT image reconstructions of the cervical spine
were also generated.

[Series 3: head wo · axial · 0.47mm/px · z∈[-115,-5]mm · 7 of 30 slices shown, 9 images]
[im 4/30  brain]
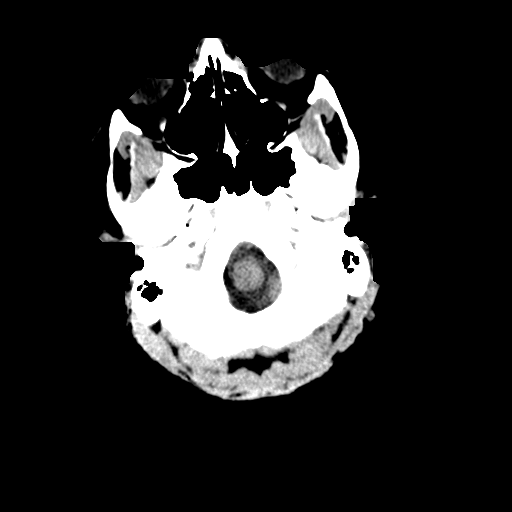
[im 4/30  bone]
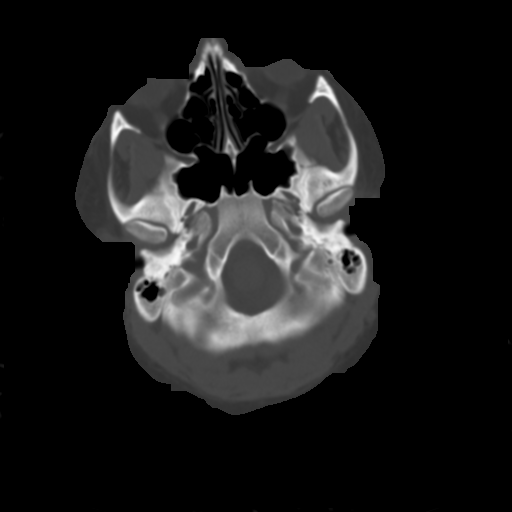
[im 8/30  brain]
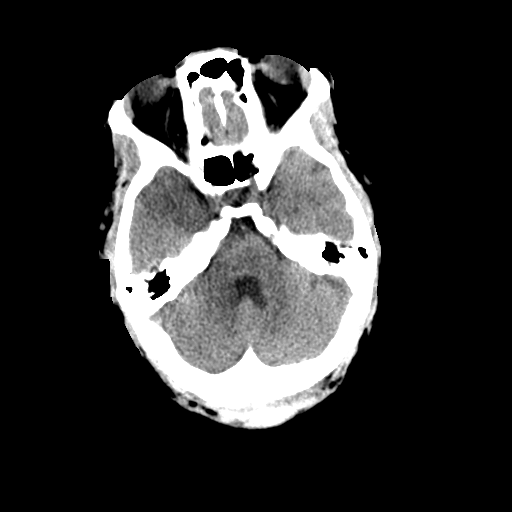
[im 11/30  brain]
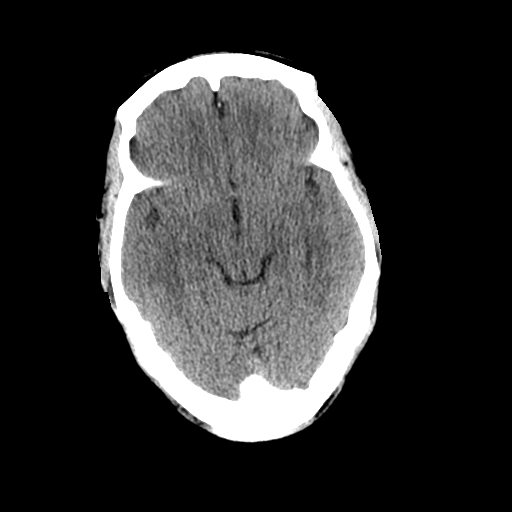
[im 15/30  brain]
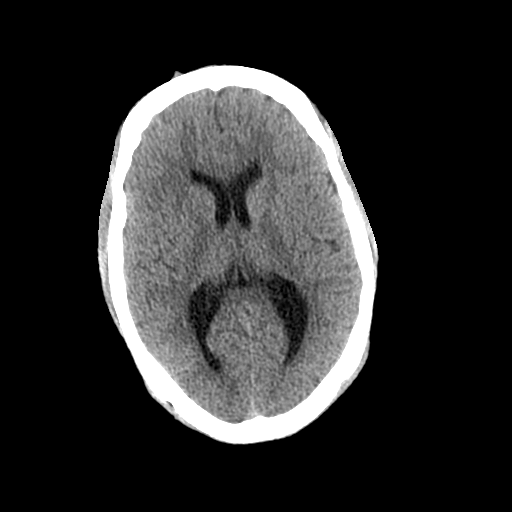
[im 19/30  brain]
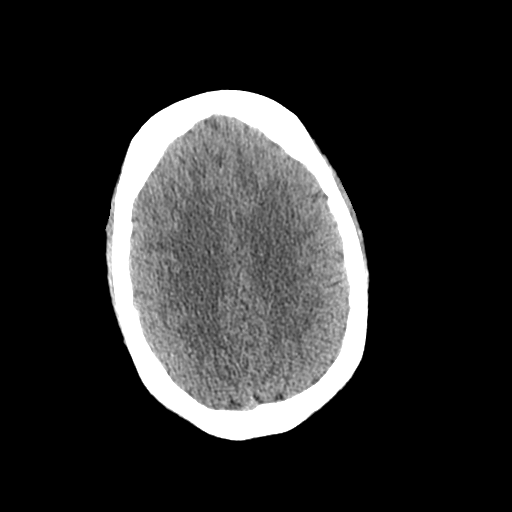
[im 19/30  bone]
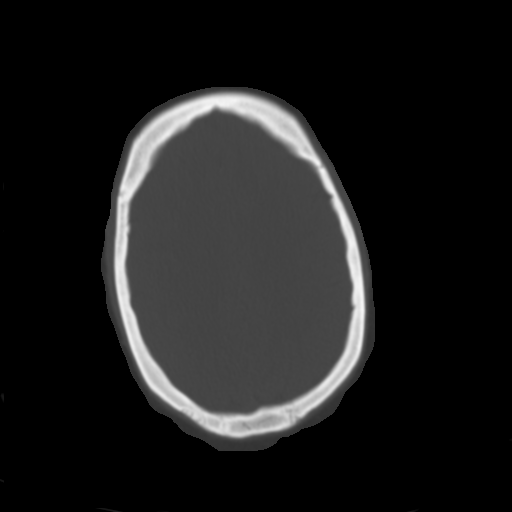
[im 22/30  brain]
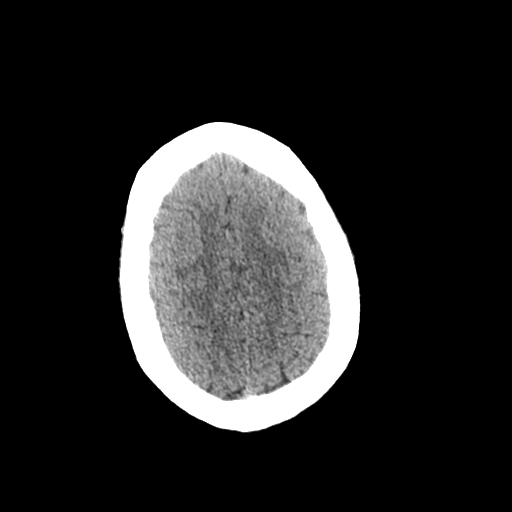
[im 26/30  brain]
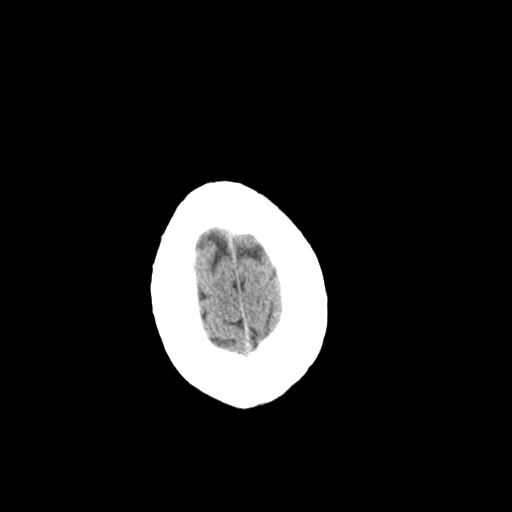

[Series 4: head bone · axial · 0.47mm/px · z∈[-116,-88]mm · 3 of 73 slices shown]
[im 8/73  bone]
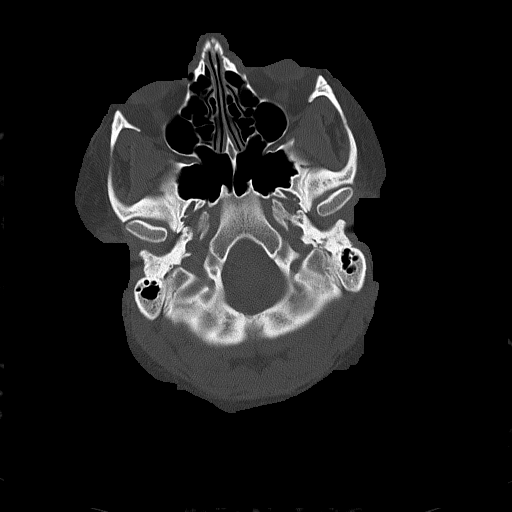
[im 15/73  bone]
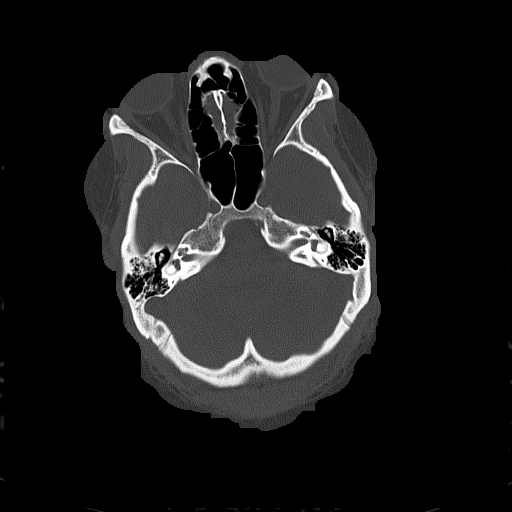
[im 22/73  bone]
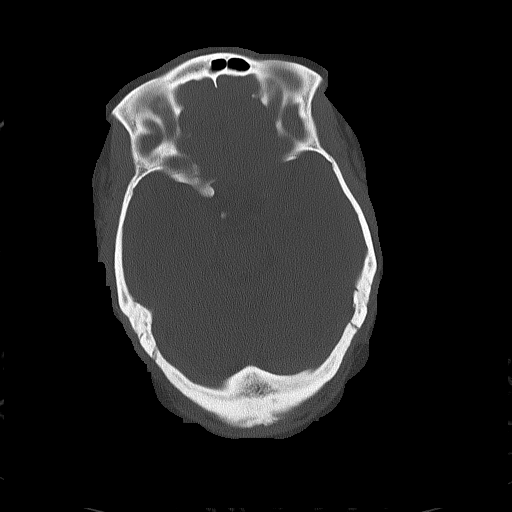

[Series 5: coronal soft tissue · coronal · 0.29mm/px · 3 of 73 slices shown]
[im 25/73  brain]
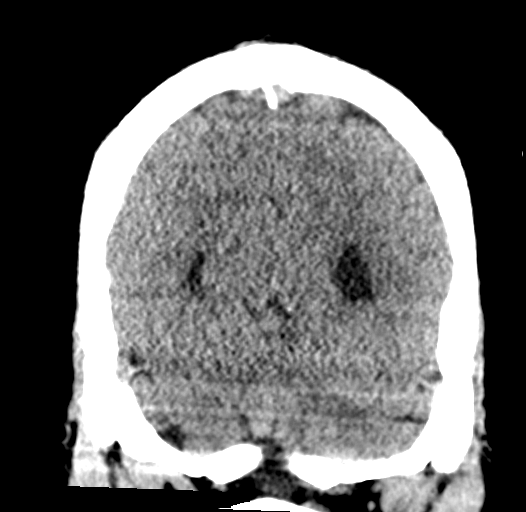
[im 33/73  brain]
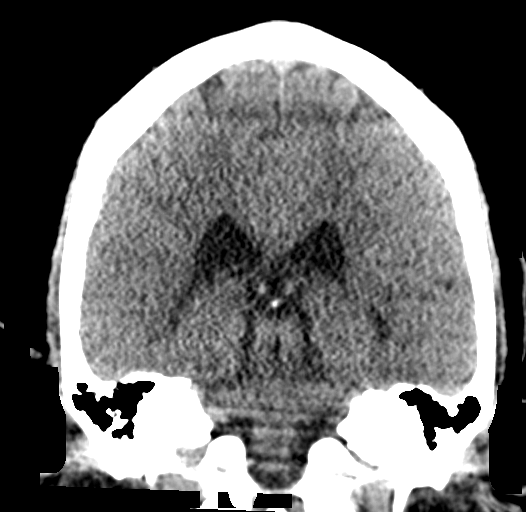
[im 41/73  brain]
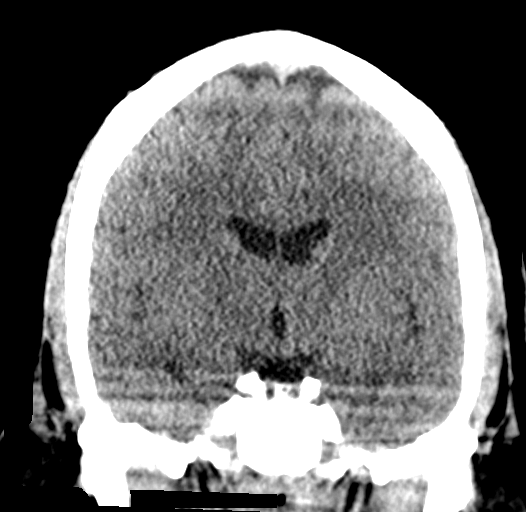

[Series 6: sagittal soft tissue · sagittal · 0.29mm/px · 3 of 51 slices shown]
[im 17/51  brain]
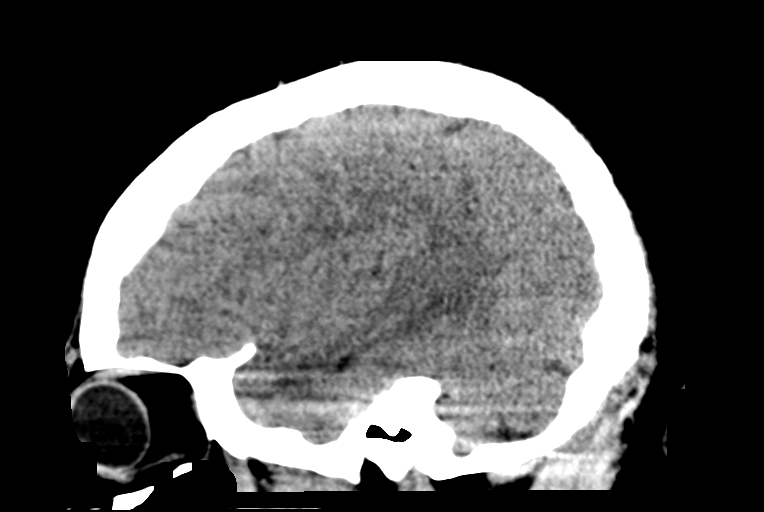
[im 26/51  brain]
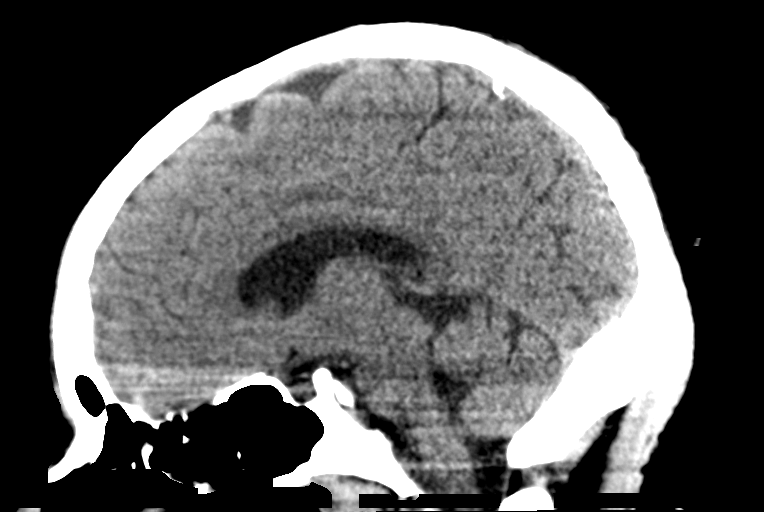
[im 34/51  brain]
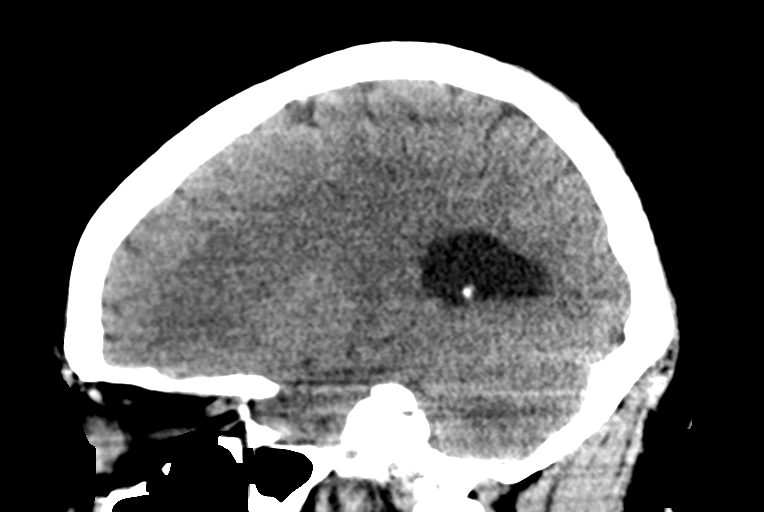

[16 of 47 positions shown; findings below may reference images not displayed]

FINDINGS: CT HEAD FINDINGS

Brain: No evidence of acute infarction, hemorrhage, hydrocephalus,
extra-axial collection or mass lesion/mass effect.

Vascular: No hyperdense vessel or unexpected calcification.

Skull: Normal. Negative for fracture or focal lesion.

Sinuses/Orbits: No acute finding.

Other: None.

CT CERVICAL SPINE FINDINGS

Alignment: Normal.

Skull base and vertebrae: No acute fracture. No primary bone lesion
or focal pathologic process.

Soft tissues and spinal canal: No prevertebral fluid or swelling. No
visible canal hematoma.

Disc levels:  Preserved.

Upper chest: Negative.

Other: None.
IMPRESSION: 1. No acute intracranial process.
2. No acute osseous injury in the cervical spine.

## 2020-06-06 IMAGING — CT CT CERVICAL SPINE W/O CM
3 of 4 series · 13 of 33 positions shown, 16 images · non-contrast
Comparison: None.

CLINICAL DATA: Unwitnessed fall with head and neck trauma.

EXAM:
CT HEAD WITHOUT CONTRAST
CT CERVICAL SPINE WITHOUT CONTRAST
TECHNIQUE: Multidetector CT imaging of the head and cervical spine was
performed following the standard protocol without intravenous
contrast. Multiplanar CT image reconstructions of the cervical spine
were also generated.

[Series 7: sagittal bone · sagittal · 0.25mm/px · 5 of 56 slices shown, 6 images]
[im 19/56  bone]
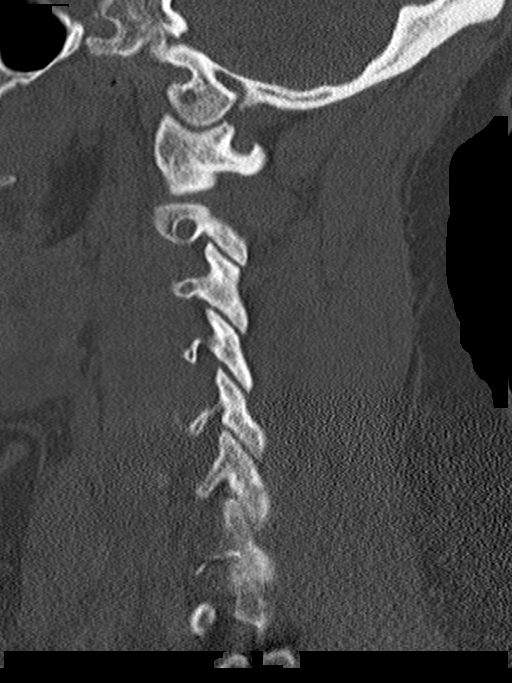
[im 23/56  bone]
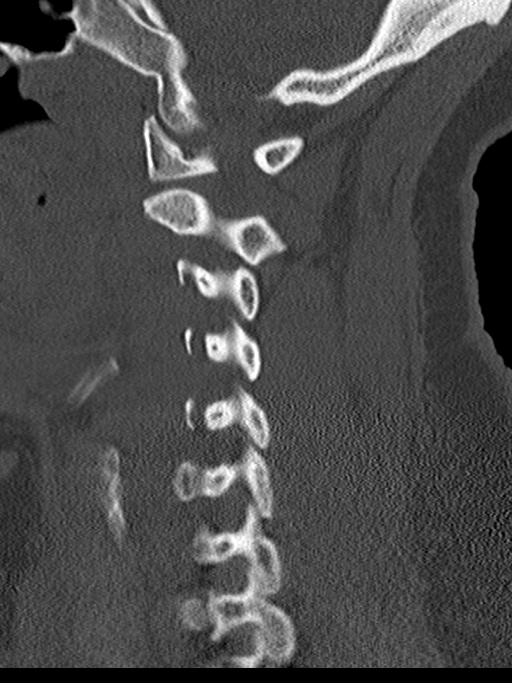
[im 28/56  soft-tissue]
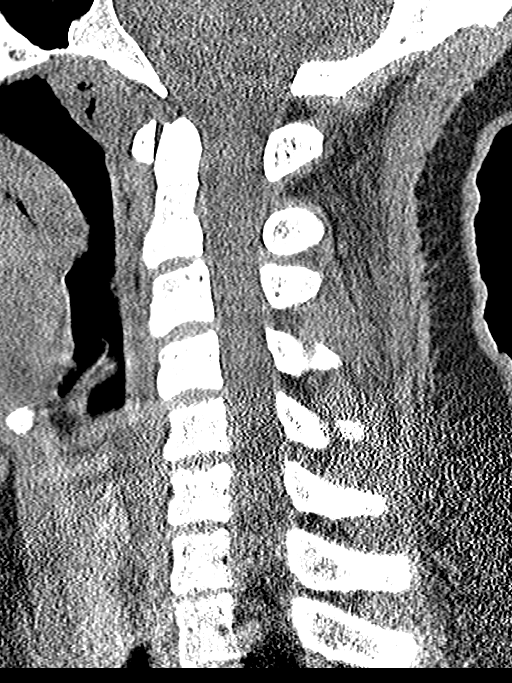
[im 28/56  bone]
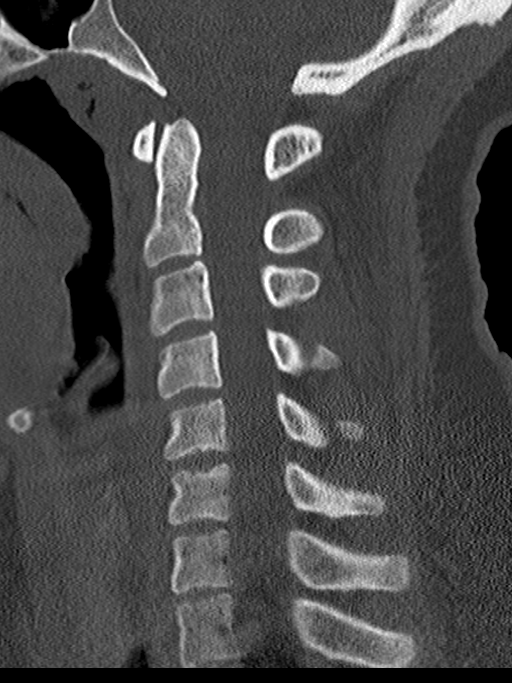
[im 33/56  bone]
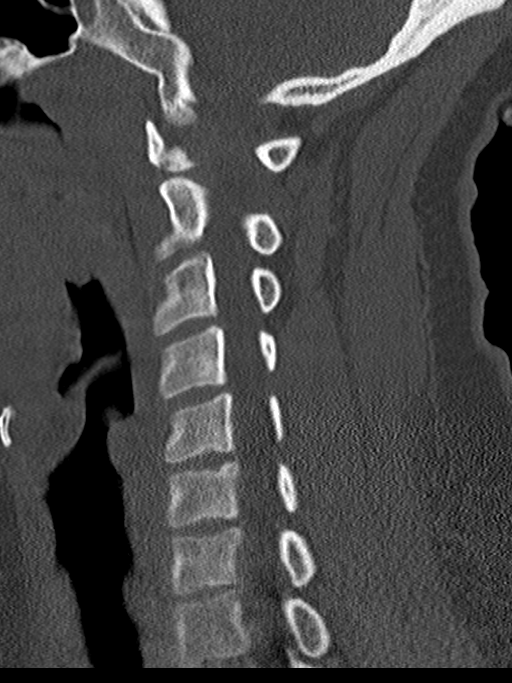
[im 37/56  bone]
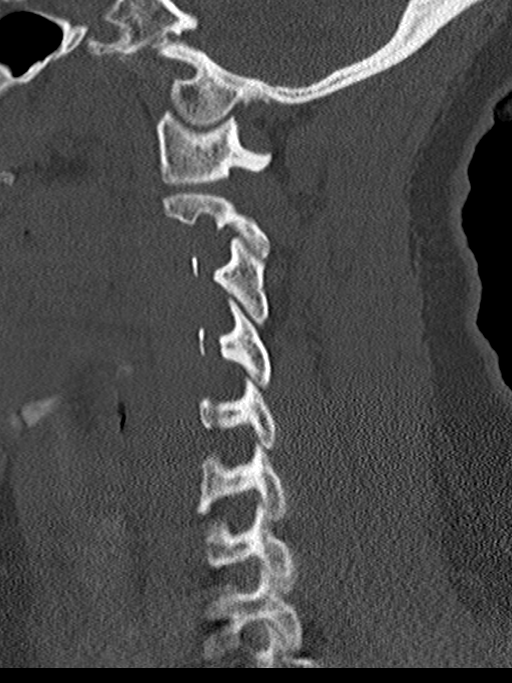

[Series 8: coronal bone · coronal · 0.25mm/px · 3 of 61 slices shown]
[im 13/61  bone]
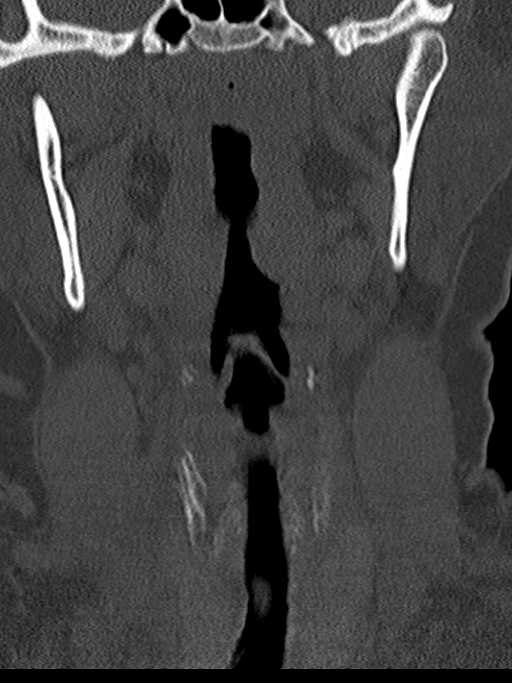
[im 25/61  bone]
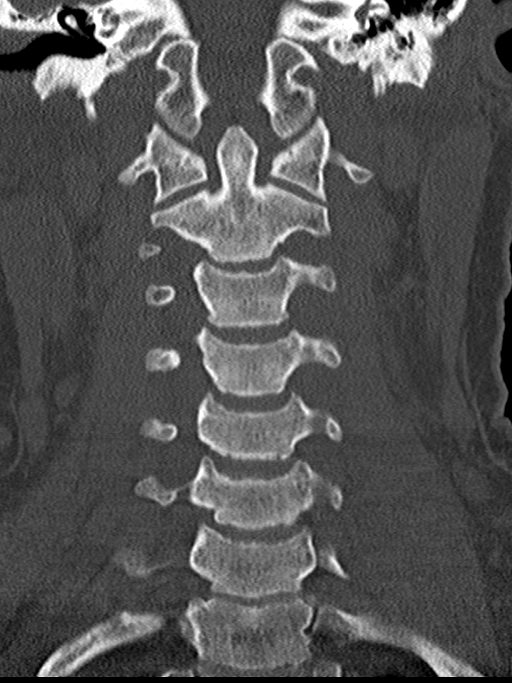
[im 37/61  bone]
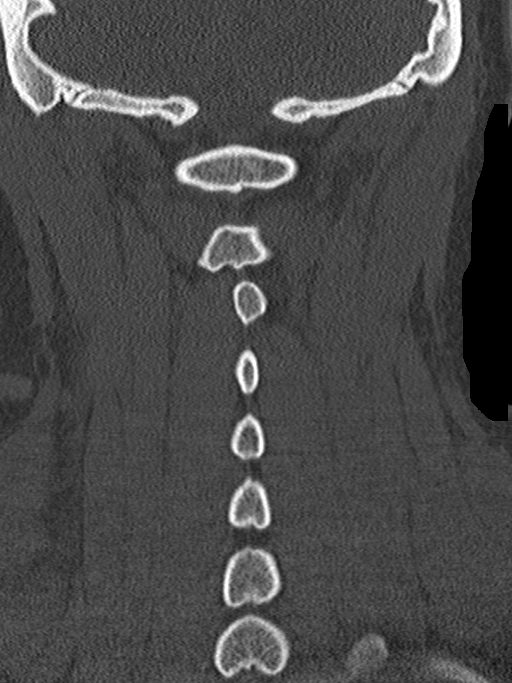

[Series 10: orthogonal bone · axial · 0.21mm/px · z∈[-270,-174]mm · 5 of 97 slices shown, 7 images]
[im 17/97  soft-tissue]
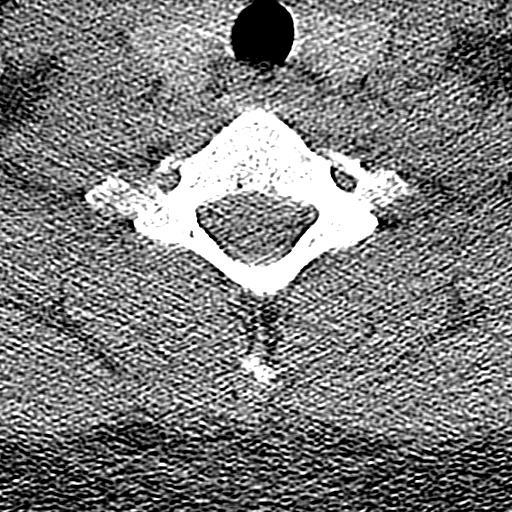
[im 17/97  bone]
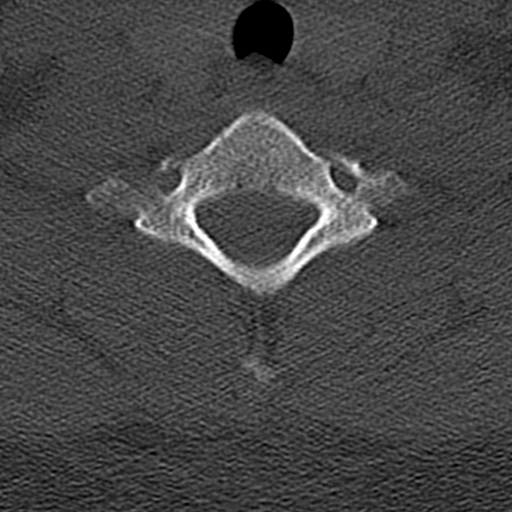
[im 33/97  bone]
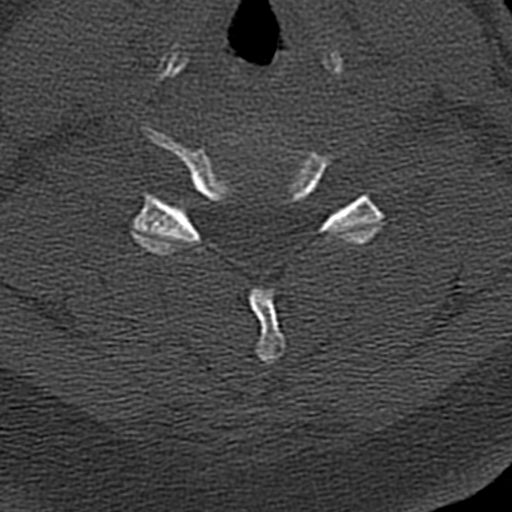
[im 49/97  bone]
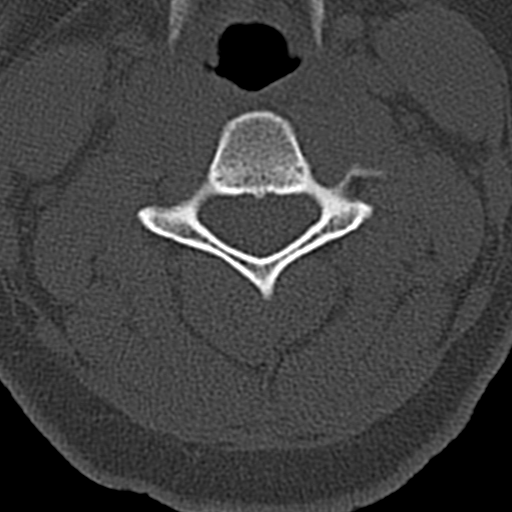
[im 65/97  bone]
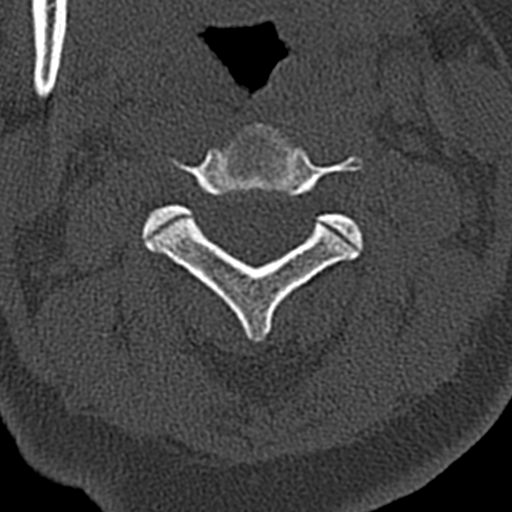
[im 81/97  soft-tissue]
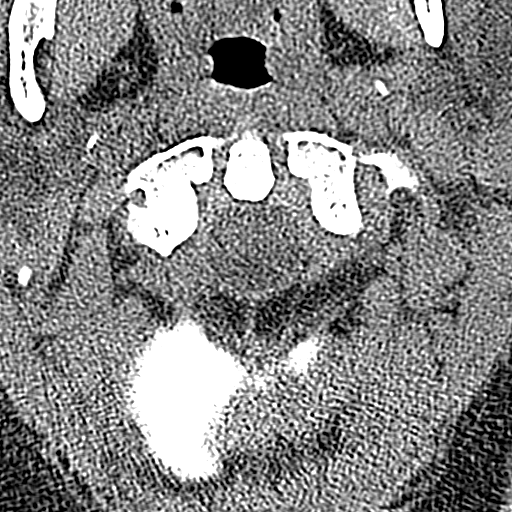
[im 81/97  bone]
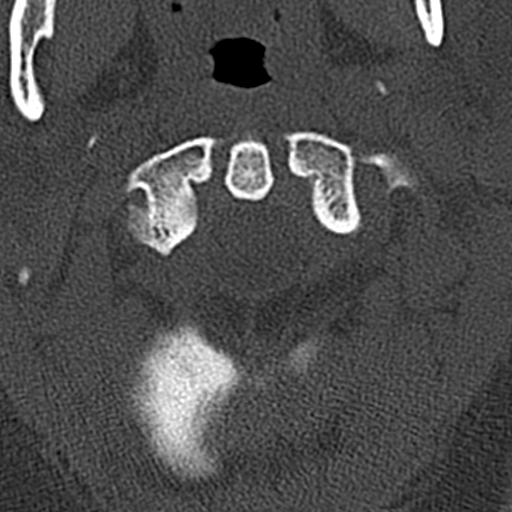

[13 of 33 positions shown; findings below may reference images not displayed]

FINDINGS: CT HEAD FINDINGS

Brain: No evidence of acute infarction, hemorrhage, hydrocephalus,
extra-axial collection or mass lesion/mass effect.

Vascular: No hyperdense vessel or unexpected calcification.

Skull: Normal. Negative for fracture or focal lesion.

Sinuses/Orbits: No acute finding.

Other: None.

CT CERVICAL SPINE FINDINGS

Alignment: Normal.

Skull base and vertebrae: No acute fracture. No primary bone lesion
or focal pathologic process.

Soft tissues and spinal canal: No prevertebral fluid or swelling. No
visible canal hematoma.

Disc levels:  Preserved.

Upper chest: Negative.

Other: None.
IMPRESSION: 1. No acute intracranial process.
2. No acute osseous injury in the cervical spine.

## 2020-06-08 ENCOUNTER — Ambulatory Visit (HOSPITAL_COMMUNITY): Payer: Medicaid Other | Admitting: Psychiatry

## 2020-09-09 ENCOUNTER — Encounter (HOSPITAL_COMMUNITY): Payer: Self-pay | Admitting: Emergency Medicine

## 2020-09-09 ENCOUNTER — Emergency Department (HOSPITAL_COMMUNITY)
Admission: EM | Admit: 2020-09-09 | Discharge: 2020-09-13 | Disposition: A | Discharge status: Home / Self Care | Payer: Medicaid Other | Attending: Emergency Medicine | Admitting: Emergency Medicine

## 2020-09-09 ENCOUNTER — Other Ambulatory Visit: Payer: Self-pay

## 2020-09-09 DIAGNOSIS — Z046 Encounter for general psychiatric examination, requested by authority: Secondary | ICD-10-CM | POA: Insufficient documentation

## 2020-09-09 DIAGNOSIS — F200 Paranoid schizophrenia: Secondary | ICD-10-CM | POA: Diagnosis present

## 2020-09-09 DIAGNOSIS — F329 Major depressive disorder, single episode, unspecified: Secondary | ICD-10-CM | POA: Diagnosis not present

## 2020-09-09 DIAGNOSIS — F250 Schizoaffective disorder, bipolar type: Secondary | ICD-10-CM | POA: Insufficient documentation

## 2020-09-09 DIAGNOSIS — R451 Restlessness and agitation: Secondary | ICD-10-CM | POA: Insufficient documentation

## 2020-09-09 DIAGNOSIS — Z9114 Patient's other noncompliance with medication regimen: Secondary | ICD-10-CM | POA: Insufficient documentation

## 2020-09-09 DIAGNOSIS — Z20822 Contact with and (suspected) exposure to covid-19: Secondary | ICD-10-CM | POA: Diagnosis not present

## 2020-09-09 DIAGNOSIS — F209 Schizophrenia, unspecified: Secondary | ICD-10-CM

## 2020-09-09 LAB — CBC WITH DIFFERENTIAL/PLATELET
Abs Immature Granulocytes: 0.02 10*3/uL (ref 0.00–0.07)
Basophils Absolute: 0 10*3/uL (ref 0.0–0.1)
Basophils Relative: 0 %
Eosinophils Absolute: 0.1 10*3/uL (ref 0.0–0.5)
Eosinophils Relative: 1 %
HCT: 39.8 % (ref 36.0–46.0)
Hemoglobin: 13.1 g/dL (ref 12.0–15.0)
Immature Granulocytes: 0 %
Lymphocytes Relative: 21 %
Lymphs Abs: 2.3 10*3/uL (ref 0.7–4.0)
MCH: 30.8 pg (ref 26.0–34.0)
MCHC: 32.9 g/dL (ref 30.0–36.0)
MCV: 93.6 fL (ref 80.0–100.0)
Monocytes Absolute: 1 10*3/uL (ref 0.1–1.0)
Monocytes Relative: 9 %
Neutro Abs: 7.7 10*3/uL (ref 1.7–7.7)
Neutrophils Relative %: 69 %
Platelets: 302 10*3/uL (ref 150–400)
RBC: 4.25 MIL/uL (ref 3.87–5.11)
RDW: 13.2 % (ref 11.5–15.5)
WBC: 11.2 10*3/uL — ABNORMAL HIGH (ref 4.0–10.5)
nRBC: 0 % (ref 0.0–0.2)

## 2020-09-09 LAB — COMPREHENSIVE METABOLIC PANEL
ALT: 19 U/L (ref 0–44)
AST: 26 U/L (ref 15–41)
Albumin: 4 g/dL (ref 3.5–5.0)
Alkaline Phosphatase: 57 U/L (ref 38–126)
Anion gap: 12 (ref 5–15)
BUN: 13 mg/dL (ref 6–20)
CO2: 25 mmol/L (ref 22–32)
Calcium: 9.7 mg/dL (ref 8.9–10.3)
Chloride: 101 mmol/L (ref 98–111)
Creatinine, Ser: 0.88 mg/dL (ref 0.44–1.00)
GFR, Estimated: >60 mL/min (ref >60)
Glucose, Bld: 98 mg/dL (ref 70–99)
Potassium: 4.1 mmol/L (ref 3.5–5.1)
Sodium: 138 mmol/L (ref 135–145)
Total Bilirubin: 0.8 mg/dL (ref 0.3–1.2)
Total Protein: 10 g/dL — ABNORMAL HIGH (ref 6.5–8.1)

## 2020-09-09 LAB — RESP PANEL BY RT-PCR (FLU A&B, COVID) ARPGX2
Influenza A by PCR: NEGATIVE
Influenza B by PCR: NEGATIVE
SARS Coronavirus 2 by RT PCR: NEGATIVE

## 2020-09-09 LAB — ETHANOL: Alcohol, Ethyl (B): <10 mg/dL (ref <10)

## 2020-09-09 LAB — HCG, QUANTITATIVE, PREGNANCY: hCG, Beta Chain, Quant, S: <1 m[IU]/mL (ref <5)

## 2020-09-09 MED ORDER — LORAZEPAM 2 MG/ML IJ SOLN
2.0000 mg | Freq: Once | INTRAMUSCULAR | Status: AC
  Administered 2020-09-09: 2 mg via INTRAMUSCULAR
  Filled 2020-09-09: qty 1

## 2020-09-09 MED ORDER — ALUM & MAG HYDROXIDE-SIMETH 200-200-20 MG/5ML PO SUSP: 30.0000 mL | Freq: Four times a day (QID) | ORAL | Status: DC | PRN

## 2020-09-09 MED ORDER — CLONIDINE HCL 0.1 MG PO TABS
0.2000 mg | ORAL_TABLET | Freq: Two times a day (BID) | ORAL | Status: DC
  Administered 2020-09-09 – 2020-09-13 (×6): 0.2 mg via ORAL
  Filled 2020-09-09 (×9): qty 2

## 2020-09-09 MED ORDER — ZIPRASIDONE MESYLATE 20 MG IM SOLR
20.0000 mg | Freq: Once | INTRAMUSCULAR | Status: AC
  Administered 2020-09-09: 20 mg via INTRAMUSCULAR
  Filled 2020-09-09: qty 20

## 2020-09-09 MED ORDER — RISPERIDONE 2 MG PO TABS
6.0000 mg | ORAL_TABLET | Freq: Every day | ORAL | Status: DC
  Administered 2020-09-09 – 2020-09-10 (×2): 6 mg via ORAL
  Filled 2020-09-09 (×3): qty 3

## 2020-09-09 MED ORDER — BENZTROPINE MESYLATE 0.5 MG PO TABS: 0.5000 mg | ORAL_TABLET | Freq: Two times a day (BID) | ORAL | Status: DC | PRN

## 2020-09-09 MED ORDER — TRAZODONE HCL 50 MG PO TABS
150.0000 mg | ORAL_TABLET | Freq: Every day | ORAL | Status: DC
  Administered 2020-09-09 – 2020-09-10 (×2): 150 mg via ORAL
  Filled 2020-09-09 (×4): qty 1

## 2020-09-09 MED ORDER — ACETAMINOPHEN 325 MG PO TABS: 650.0000 mg | ORAL_TABLET | ORAL | Status: DC | PRN

## 2020-09-09 MED ORDER — ONDANSETRON HCL 4 MG PO TABS: 4.0000 mg | ORAL_TABLET | Freq: Three times a day (TID) | ORAL | Status: DC | PRN

## 2020-09-09 NOTE — ED Triage Notes (Signed)
Per EMS-has been off psych meds, not sure how long-not responding to verbal stimuli-will not respond to simple commands-ambulatory-nonverbal-symptoms started this am per family-states she was fine yesterday-found in garage not talking

## 2020-09-09 NOTE — ED Notes (Signed)
Patient verbally contracted for safety and release of restraints. Patient reports she understands not to try to run or harm herself or anyone else on unit. Restraints removed, skin examined, and patient assisted to stand and go to the bathroom. Patient currently has a flat affect and is slightly agitated but under
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
## 2020-09-09 NOTE — ED Notes (Signed)
Writer placed BP cuff and pulse ox on left arm and right index finger-attempted to gather VS-patient removed cuff and pulse ox-will pass on info to [employer] so she may attempt to get at later time

## 2020-09-09 NOTE — ED Provider Notes (Signed)
[facility]-EMERGENCY DEPT Provider Note   CSN: [id number] Arrival date & time: 09/09/20  1024     History Chief Complaint  Patient presents with  . Psychiatric Evaluation    Patient nonverbal, not responding to simple stimuli-unsure of when she last took psyche meds    Lindsey Richmond is a 24 y.o. female.  Pt presents to the ED today via EMS.  Pt has a hx of schizophrenia.  She has been off her meds (unkn how long).  She was found in the garage this am by her family and would not respond to them.  She has gotten very agitated and was trying to leave the ED.  She would not answer any of my questions.  Pt made IVC by the police.        Past Medical History:  Diagnosis Date  . Depression   . Oppositional defiant behavior   . Schizo affective schizophrenia (HCC)   . Schizophrenia [facility])     Patient Active Problem List   Diagnosis Date Noted  . Schizoaffective disorder, bipolar type (HCC)   . Schizo-affective schizophrenia (HCC) 12/25/2019  . Schizophrenia (HCC) 12/25/2019  . Schizophrenia (HCC) 01/15/2019  . Paranoid schizophrenia (HCC) 01/14/2019  . Schizophrenia, paranoid (HCC) 04/20/2017    History reviewed. No pertinent surgical history.   OB History   No obstetric history on file.     Family History  Problem Relation Age of Onset  . Heart failure Mother   . Hypertension Mother   . Diabetes Father     Social History   Tobacco Use  . Smoking status: Never Smoker  . Smokeless tobacco: Never Used  Vaping Use  . Vaping Use: Never used  Substance Use Topics  . Alcohol use: No  . Drug use: No    Home Medications Prior to Admission medications   Medication Sig Start Date End Date Taking? Authorizing Provider  benztropine (COGENTIN) 0.5 MG tablet Take 1 tablet (0.5 mg total) by mouth 2 (two) times daily as needed for tremors (EPS). 01/12/20  Yes Malvin Johns, MD  cloNIDine (CATAPRES) 0.2 MG tablet Take 1 tablet (0.2 mg total) by mouth 2  (two) times daily. 01/12/20  Yes Malvin Johns, MD  paliperidone (INVEGA SUSTENNA) 156 MG/ML SUSY injection Inject 1 mL (156 mg total) into the muscle every 30 (thirty) days. 01/12/20  Yes Malvin Johns, MD  risperiDONE (RISPERDAL) 3 MG tablet Take 2 tablets (6 mg total) by mouth at bedtime. 01/12/20  Yes Malvin Johns, MD  traZODone (DESYREL) 150 MG tablet Take 150 mg by mouth at bedtime.   Yes [provider]    Allergies    Patient has no known allergies.  Review of Systems   Review of Systems  Unable to perform ROS: Psychiatric disorder    Physical Exam Updated Vital Signs BP (!) 160/83 (BP Location: Left Arm)   Pulse (!) 101   Temp 98.1 F (36.7 C)   Resp 18   SpO2 98%   Physical Exam Vitals and nursing note reviewed.  Constitutional:      Appearance: Normal appearance.  HENT:     Head: Normocephalic and atraumatic.     Right Ear: External ear normal.     Left Ear: External ear normal.     Nose: Nose normal.     Mouth/Throat:     Mouth: Mucous membranes are moist.     Pharynx: Oropharynx is clear.  Eyes:     Extraocular Movements: Extraocular movements intact.  Conjunctiva/sclera: Conjunctivae normal.     Pupils: Pupils are equal, round, and reactive to light.  Cardiovascular:     Rate and Rhythm: Normal rate and regular rhythm.     Pulses: Normal pulses.     Heart sounds: Normal heart sounds.  Pulmonary:     Effort: Pulmonary effort is normal.     Breath sounds: Normal breath sounds.  Abdominal:     General: Abdomen is flat. Bowel sounds are normal.     Palpations: Abdomen is soft.  Musculoskeletal:        General: Normal range of motion.     Cervical back: Normal range of motion and neck supple.  Skin:    General: Skin is warm.     Capillary Refill: Capillary refill takes less than 2 seconds.  Neurological:     General: No focal deficit present.     Mental Status: She is alert.     Comments: Pt will not answer questions or follow commands.   Psychiatric:        Mood and Affect: Affect is angry.        Behavior: Behavior is agitated.     ED Results / Procedures / Treatments   Labs (all labs ordered are listed, but only abnormal results are displayed) Labs Reviewed  RESP PANEL BY RT-PCR (FLU A&B, COVID) [id number]  COMPREHENSIVE METABOLIC PANEL  ETHANOL  RAPID URINE DRUG SCREEN, HOSP PERFORMED  CBC WITH DIFFERENTIAL/PLATELET  URINALYSIS, ROUTINE W REFLEX MICROSCOPIC  HCG, QUANTITATIVE, PREGNANCY    EKG None  Radiology No results found.  Procedures Procedures (including critical care time)  Medications Ordered in ED Medications  acetaminophen (TYLENOL) tablet 650 mg (has no administration in time range)  ondansetron (ZOFRAN) tablet 4 mg (has no administration in time range)  alum & mag hydroxide-simeth (MAALOX/MYLANTA) 200-200-20 MG/5ML suspension 30 mL (has no administration in time range)  benztropine (COGENTIN) tablet 0.5 mg (has no administration in time range)  cloNIDine (CATAPRES) tablet 0.2 mg (has no administration in time range)  risperiDONE (RISPERDAL) tablet 6 mg (has no administration in time range)  traZODone (DESYREL) tablet 150 mg (has no administration in time range)  ziprasidone (GEODON) injection 20 mg (20 mg Intramuscular Given 09/09/20 1056)  LORazepam (ATIVAN) injection 2 mg (2 mg Intramuscular Given by Other 09/09/20 1141)    ED Course  I have reviewed the triage vital signs and the nursing notes.  Pertinent labs & imaging results that were available during my care of the patient were reviewed by me and considered in my medical decision making (see chart for details).    MDM Rules/Calculators/A&P                         Labs are pending to medically clear.  Home meds re-ordered.  Pt will need a TTS consult which I've been ordered.   Final Clinical Impression(s) / ED Diagnoses Final diagnoses:  Schizophrenia, unspecified type (HCC)  Noncompliance with medications    Rx / DC  Orders ED Discharge Orders    None       Jacalyn Lefevre, MD 09/09/20 1535

## 2020-09-09 NOTE — ED Notes (Signed)
Notified Lab to draw blood.

## 2020-09-09 NOTE — [facility] Counselor (Signed)
Lindsey Abts, PA recommends patient be observed overnight for safety and stabilization. Patient to remain in ED due to IVC.

## 2020-09-09 NOTE — ED Notes (Signed)
Pt not answering assessment questions. Pt confused.disorganized.  Pt continues to try to leave, uncooperative with care.

## 2020-09-09 NOTE — ED Notes (Signed)
Patient currently calm and cooperative. Denies wanting food, drink, or blanket for comfort. Will continue to monitor.

## 2020-09-10 DIAGNOSIS — F200 Paranoid schizophrenia: Secondary | ICD-10-CM

## 2020-09-10 LAB — RAPID URINE DRUG SCREEN, HOSP PERFORMED
Amphetamines: NOT DETECTED
Barbiturates: NOT DETECTED
Benzodiazepines: NOT DETECTED
Cocaine: NOT DETECTED
Opiates: NOT DETECTED
Tetrahydrocannabinol: NOT DETECTED

## 2020-09-10 LAB — URINALYSIS, ROUTINE W REFLEX MICROSCOPIC
Bacteria, UA: NONE SEEN
Bilirubin Urine: NEGATIVE
Glucose, UA: NEGATIVE mg/dL
Hgb urine dipstick: NEGATIVE
Ketones, ur: 5 mg/dL — AB
Nitrite: NEGATIVE
Protein, ur: 100 mg/dL — AB
Specific Gravity, Urine: 1.033 — ABNORMAL HIGH (ref 1.005–1.030)
pH: 5 (ref 5.0–8.0)

## 2020-09-10 LAB — CBG MONITORING, ED: Glucose-Capillary: 95 mg/dL (ref 70–99)

## 2020-09-10 MED ORDER — LORAZEPAM 1 MG PO TABS
1.0000 mg | ORAL_TABLET | ORAL | Status: DC | PRN
  Filled 2020-09-10: qty 1

## 2020-09-10 MED ORDER — STERILE WATER FOR INJECTION IJ SOLN
INTRAMUSCULAR | Status: AC
  Administered 2020-09-10: 10 mL
  Filled 2020-09-10: qty 10

## 2020-09-10 MED ORDER — ZIPRASIDONE MESYLATE 20 MG IM SOLR
20.0000 mg | Freq: Two times a day (BID) | INTRAMUSCULAR | Status: DC | PRN
  Administered 2020-09-10 – 2020-09-13 (×5): 20 mg via INTRAMUSCULAR
  Filled 2020-09-10 (×5): qty 20
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
## 2020-09-10 NOTE — Consult Note (Signed)
[facility]  09/10/2020 10:48 AM Lindsey Richmond  MRN:  [id number] Subjective: Patient presents with somewhat disorganized conversation states the words "hurt" and "Jesus."  Patient assessed by nurse practitioner.  Patient alert and oriented, does not participate verbally in assessment past initial few minutes.  Patient denies suicidal and homicidal ideations.  Patient denies auditory and visual hallucinations however appears to look for wall when no one is speaking.  Patient appears to look toward ceiling prior to verbally responding to assessment questions.  Patient appears to be responding to internal stimuli at this time.  Attempted to discuss treatment plan with patient who does not respond verbally but does shake head to demonstrate understanding.  Patient offered support and encouragement.  Patient gives verbal consent to speak with her mother regarding her care.  Telephone patient's mother, Lindsey Richmond phone number [phone]  Spoke with patient's mother who reports patient receives inject[facility] In[facility].  Patient's mother reports next dose is due September 14, 2020.  Patient's mother reports she believes patient has not been taking her oral medications possibly "for a while may be September."  Principal Problem: Paranoid schizophrenia (HCC) Diagnosis:  Principal Problem:   Paranoid schizophrenia (HCC)  Total Time spent with patient: 20 minutes  Past Psychiatric History: Paranoid schizophrenia, schizoaffective schizophrenia, schizoaffective disorder, bipolar type  Past Medical History:  Past Medical History:  Diagnosis Date  . Depression   . Oppositional defiant behavior   . Schizo affective schizophrenia (HCC)   . Schizophrenia (HCC)    History reviewed. No pertinent surgical history. Family History:  Family History  Problem Relation Age of Onset  . Heart failure Mother   . Hypertension Mother   . Diabetes Father    Family  Psychiatric  History: None reported Social History:  Social History   Substance and Sexual Activity  Alcohol Use No     Social History   Substance and Sexual Activity  Drug Use No    Social History   Socioeconomic History  . Marital status: Single    Spouse name: Not on file  . Number of children: Not on file  . Years of education: Not on file  . Highest education level: Not on file  Occupational History  . Occupation: Unemployed  Tobacco Use  . Smoking status: Never Smoker  . Smokeless tobacco: Never Used  Vaping Use  . Vaping Use: Never used  Substance and Sexual Activity  . Alcohol use: No  . Drug use: No  . Sexual activity: Never    Birth control/protection: None  Other Topics Concern  . Not on file  Social History Narrative   ** Merged History Encounter **       Pt lives with her mother, brother, and father.  She [facility] care [facility]   Social Determinants of Health   Financial Resource Strain: Not on file  Food Insecurity: Not on file  Transportation Needs: Not on file  Physical Activity: Not on file  Stress: Not on file  Social Connections: Not on file    Sleep: Fair  Appetite:  Poor  Current Medications: Current Facility-Administered Medications  Medication Dose Route Frequency Provider Last Rate Last Admin  . acetaminophen (TYLENOL) tablet 650 mg  650 mg Oral Q4H PRN Jacalyn LefevreHaviland, Julie, MD      . alum & mag hydroxide-simeth (MAALOX/MYLANTA) 200-200-20 MG/5ML suspension 30 mL  30 mL Oral Q6H PRN Jacalyn LefevreHaviland, Julie, MD      . benztropine (COGENTIN)  tablet 0.5 mg  0.5 mg Oral BID PRN Jacalyn Lefevre, MD      . cloNIDine (CATAPRES) tablet 0.2 mg  0.2 mg Oral BID Jacalyn Lefevre, MD   0.2 mg at 09/09/20 2223  . ondansetron (ZOFRAN) tablet 4 mg  4 mg Oral Q8H PRN Jacalyn Lefevre, MD      . risperiDONE (RISPERDAL) tablet 6 mg  6 mg Oral QHS Jacalyn Lefevre, MD   6 mg at 09/09/20 2224  . traZODone (DESYREL) tablet 150 mg  150 mg  Oral QHS Jacalyn Lefevre, MD   150 mg at 09/09/20 2225   Current Outpatient Medications  Medication Sig Dispense Refill  . benztropine (COGENTIN) 0.5 MG tablet Take 1 tablet (0.5 mg total) by mouth 2 (two) times daily as needed for tremors (EPS). 60 tablet 2  . cloNIDine (CATAPRES) 0.2 MG tablet Take 1 tablet (0.2 mg total) by mouth 2 (two) times daily. 60 tablet 2  . paliperidone (INVEGA SUSTENNA) 156 MG/ML SUSY injection Inject 1 mL (156 mg total) into the muscle every 30 (thirty) days. 1.2 mL 11  . risperiDONE (RISPERDAL) 3 MG tablet Take 2 tablets (6 mg total) by mouth at bedtime. 60 tablet 3  . traZODone (DESYREL) 150 MG tablet Take 150 mg by mouth at bedtime.      Lab Results:  Results for orders placed or performed during the hospital encounter of 09/09/20 (from the past 48 hour(s))  Urine rapid drug screen (hosp performed)     Status: None   Collection Time: 09/09/20 10:43 AM  Result Value Ref Range   Opiates NONE DETECTED NONE DETECTED   Cocaine NONE DETECTED NONE DETECTED   Benzodiazepines NONE DETECTED NONE DETECTED   Amphetamines NONE DETECTED NONE DETECTED   Tetrahydrocannabinol NONE DETECTED NONE DETECTED   Barbiturates NONE DETECTED NONE DETECTED    Comment: (NOTE) DRUG SCREEN FOR MEDICAL PURPOSES ONLY.  IF CONFIRMATION IS NEEDED FOR ANY PURPOSE, NOTIFY LAB WITHIN 5 DAYS.  LOWEST DETECTABLE LIMITS FOR URINE DRUG SCREEN Drug Class                     Cutoff (ng/mL) Amphetamine and metabolites    1000 Barbiturate and metabolites    200 Benzodiazepine                 200 Tricyclics and metabolites     300 Opiates and metabolites        300 Cocaine and metabolites        300 THC                            50 Performed at [facility], [address], [place], [place] [address]   Urinalysis, Routine w reflex microscopic Urine, Clean Catch     Status: Abnormal   Collection Time: 09/09/20 10:43 AM  Result Value Ref Range   Color, Urine YELLOW  YELLOW   APPearance TURBID (A) CLEAR   Specific Gravity, Urine 1.033 (H) 1.005 - 1.030   pH 5.0 5.0 - 8.0   Glucose, UA NEGATIVE NEGATIVE mg/dL   Hgb urine dipstick NEGATIVE NEGATIVE   Bilirubin Urine NEGATIVE NEGATIVE   Ketones, ur 5 (A) NEGATIVE mg/dL   Protein, ur 160 (A) NEGATIVE mg/dL   Nitrite NEGATIVE NEGATIVE   Leukocytes,Ua MODERATE (A) NEGATIVE   RBC / HPF 0-5 0 - 5 RBC/hpf   WBC, UA 6-10 0 - 5 WBC/hpf   Bacteria,  UA NONE SEEN NONE SEEN   Squamous Epithelial / LPF 0-5 0 - 5   Mucus PRESENT    Amorphous Crystal PRESENT    Crystals PRESENT (A) NEGATIVE    Comment: Performed at [facility], [address], [place], [place] [address]  Resp Panel by RT-PCR (Flu A&B, Covid) Nasopharyngeal Swab     Status: None   Collection Time: 09/09/20  2:55 PM   Specimen: Nasopharyngeal Swab; Nasopharyngeal(NP) swabs in vial transport medium  Result Value Ref Range   SARS Coronavirus 2 by RT PCR NEGATIVE NEGATIVE    Comment: (NOTE) SARS-CoV-2 target nucleic acids are NOT DETECTED.  The SARS-CoV-2 RNA is generally detectable in upper respiratory specimens during the acute phase of infection. The lowest concentration of SARS-CoV-2 viral copies this assay can detect is 138 copies/mL. A negative result does not preclude SARS-Cov-2 infection and should not be used as the sole basis for treatment or other patient management decisions. A negative result may occur with  improper specimen collection/handling, submission of specimen other than nasopharyngeal swab, presence of viral mutation(s) within the areas targeted by this assay, and inadequate number of viral copies(<138 copies/mL). A negative result must be combined with clinical observations, patient history, and epidemiological information. The expected result is Negative.  Fact Sheet for Patients:  BloggerCourse.com  Fact Sheet for Healthcare Providers:   SeriousBroker.it  This test is no t yet approved or cleared by the [place] FDA and  has been authorized for detection and/or diagnosis of SARS-CoV-2 by FDA under an Emergency Use Authorization (EUA). This EUA will remain  in effect (meaning this test can be used) for the duration of the COVID-19 declaration under Section 564(b)(1) of the Act, 21 U.S.C.section 360bbb-3(b)(1), unless the authorization is terminated  or revoked sooner.       Influenza A by PCR NEGATIVE NEGATIVE   Influenza B by PCR NEGATIVE NEGATIVE    Comment: (NOTE) The Xpert Xpress SARS-CoV-2/FLU/RSV plus assay is intended as an aid in the diagnosis of influenza from Nasopharyngeal swab specimens and should not be used as a sole basis for treatment. Nasal washings and aspirates are unacceptable for Xpert Xpress SARS-CoV-2/FLU/RSV testing.  Fact Sheet for Patients: BloggerCourse.com  Fact Sheet for Healthcare Providers: SeriousBroker.it  This test is not yet approved or cleared by the [place] FDA and has been authorized for detection and/or diagnosis of SARS-CoV-2 by FDA under an Emergency Use Authorization (EUA). This EUA will remain in effect (meaning this test can be used) for the duration of the COVID-19 declaration under Section 564(b)(1) of the Act, 21 U.S.C. section 360bbb-3(b)(1), unless the authorization is terminated or revoked.  Performed at [facility], [address], [place], [place] [address]   hCG, quantitative, pregnancy     Status: None   Collection Time: 09/09/20  3:19 PM  Result Value Ref Range   hCG, Beta Chain, Quant, S <1 <5 mIU/mL    Comment:          GEST. AGE      CONC.  (mIU/mL)   <=1 WEEK        5 - 50     2 WEEKS       50 - 500     3 WEEKS       100 - 10,000     4 WEEKS     1,000 - 30,000     5 WEEKS     3,500 - 115,000   6-8  WEEKS     12,000 - 270,000    12 WEEKS      15,000 - 220,000        FEMALE AND NON-PREGNANT FEMALE:     LESS THAN 5 mIU/mL Performed at [facility], [address], [place], [place] [address]   Comprehensive metabolic panel     Status: Abnormal   Collection Time: 09/09/20  3:28 PM  Result Value Ref Range   Sodium 138 135 - 145 mmol/L   Potassium 4.1 3.5 - 5.1 mmol/L   Chloride 101 98 - 111 mmol/L   CO2 25 22 - 32 mmol/L   Glucose, Bld 98 70 - 99 mg/dL    Comment: Glucose reference range applies only to samples taken after fasting for at least 8 hours.   BUN 13 6 - 20 mg/dL   Creatinine, Ser 2.33 0.44 - 1.00 mg/dL   Calcium 9.7 8.9 - 61.2 mg/dL   Total Protein 24.4 (H) 6.5 - 8.1 g/dL   Albumin 4.0 3.5 - 5.0 g/dL   AST 26 15 - 41 U/L   ALT 19 0 - 44 U/L   Alkaline Phosphatase 57 38 - 126 U/L   Total Bilirubin 0.8 0.3 - 1.2 mg/dL   GFR, Estimated >97 >53 mL/min    Comment: (NOTE) Calculated using the CKD-EPI Creatinine Equation (2021)    Anion gap 12 5 - 15    Comment: Performed at [facility], [address] [place], [place] [address]  Ethanol     Status: None   Collection Time: 09/09/20  3:28 PM  Result Value Ref Range   Alcohol, Ethyl (B) <10 <10 mg/dL    Comment: (NOTE) Lowest detectable limit for serum alcohol is 10 mg/dL.  For medical purposes only. Performed at [facility], [address] [place], [place] [address]   CBC with Diff     Status: Abnormal   Collection Time: 09/09/20  3:28 PM  Result Value Ref Range   WBC 11.2 (H) 4.0 - 10.5 K/uL   RBC 4.25 3.87 - 5.11 MIL/uL   Hemoglobin 13.1 12.0 - 15.0 g/dL   HCT 73.5 67.0 - 14.1 %   MCV 93.6 80.0 - 100.0 fL   MCH 30.8 26.0 - 34.0 pg   MCHC 32.9 30.0 - 36.0 g/dL   RDW 03.0 13.1 - 43.8 %   Platelets 302 150 - 400 K/uL   nRBC 0.0 0.0 - 0.2 %   Neutrophils Relative % 69 %   Neutro Abs 7.7 1.7 - 7.7 K/uL   Lymphocytes Relative 21 %   Lymphs Abs 2.3 0.7 - 4.0 K/uL   Monocytes Relative 9 %    Monocytes Absolute 1.0 0.1 - 1.0 K/uL   Eosinophils Relative 1 %   Eosinophils Absolute 0.1 0.0 - 0.5 K/uL   Basophils Relative 0 %   Basophils Absolute 0.0 0.0 - 0.1 K/uL   Immature Granulocytes 0 %   Abs Immature Granulocytes 0.02 0.00 - 0.07 K/uL    Comment: Performed at [facility], [address], [place], [place] [address]    Blood Alcohol level:  Lab Results  Component Value Date   ETH <10 09/09/2020   ETH <10 01/04/2020    Physical Findings: AIMS:  , ,  ,  ,    CIWA:    COWS:     Musculoskeletal: Strength & Muscle Tone: within normal limits Gait & Station: normal Patient leans: N/A  Psychiatric  Specialty Exam: Physical Exam Vitals and nursing note reviewed.  Constitutional:      Appearance: She is well-developed.  HENT:     Head: Normocephalic.  Cardiovascular:     Rate and Rhythm: Normal rate.  Pulmonary:     Effort: Pulmonary effort is normal.  Neurological:     Mental Status: She is alert and oriented to person, place, and time.  Psychiatric:        Attention and Perception: She is inattentive.        Mood and Affect: Affect is blunt.        Speech: Speech is delayed and tangential.        Behavior: Behavior is cooperative.        Thought Content: Thought content is paranoid.        Judgment: Judgment is inappropriate.     Review of Systems  Constitutional: Negative.   HENT: Negative.   Eyes: Negative.   Respiratory: Negative.   Cardiovascular: Negative.   Gastrointestinal: Negative.   Genitourinary: Negative.   Musculoskeletal: Negative.   Skin: Negative.   Neurological: Negative.   Psychiatric/Behavioral: Positive for dysphoric mood.    Blood pressure 117/69, pulse 82, temperature 98.7 F (37.1 C), resp. rate 18, SpO2 100 %.There is no height or weight on file to calculate BMI.  General Appearance: Casual  Eye Contact:  Minimal  Speech:  Blocked  Volume:  Decreased  Mood:  Dysphoric  Affect:  Non-Congruent  Thought  Process:  Disorganized and Descriptions of Associations: Loose  Orientation:  Full (Time, Place, and Person)  Thought Content:  Tangential  Suicidal Thoughts:  No  Homicidal Thoughts:  No  Memory:  Immediate;   Fair Recent;   Fair Remote;   Fair  Judgement:  Impaired  Insight:  Lacking  Psychomotor Activity:  Normal  Concentration:  Concentration: Fair and Attention Span: Fair  Recall:  [employer] of Knowledge:  Fair  Language:  Fair  Akathisia:  No  Handed:  Right  AIMS (if indicated):     Assets:  Communication Skills Desire for Improvement Financial Resources/Insurance Housing Intimacy Leisure Time Physical Health Resilience Social Support  ADL's:  Intact  Cognition:  WNL  Sleep:         Treatment Plan Summary: Daily contact with patient to assess and evaluate symptoms and progress in treatment  Patient reviewed with Dr. Bronwen Betters. Continue to recommend inpatient psychiatric treatment.  Medications, restarted home medications including: -Benztropine 0.5 mg twice daily as needed/tremors -Chronically 0.2 mg twice daily -Risperidone 6 mg nightly -Trazodone 150 mg nightly   Patrcia Dolly, FNP 09/10/2020, 10:48 AM

## 2020-09-10 NOTE — ED Notes (Signed)
Pt tried to get out of room and down the hall but security was able to redirect her.

## 2020-09-10 NOTE — ED Notes (Signed)
Assumed care of patient at this time, bed locked and low, sitting up in the bed looking at the wall, lights off. Will continue to monitor for agitation.

## 2020-09-10 NOTE — ED Notes (Signed)
Pt allowed Geodon shot to be given without putting hands on.

## 2020-09-10 NOTE — ED Provider Notes (Addendum)
Emergency Medicine Observation Re-evaluation Note  Lindsey Richmond is a 24 y.o. female, seen on rounds today.  Pt initially presented to the ED for complaints of Psychiatric Evaluation (Patient nonverbal, not responding to simple stimuli-unsure of when she last took psyche meds) and IVC Currently, the patient is sitting in bed, appearing comfortable.  Physical Exam  BP 117/69   Pulse 82   Temp 98.7 F (37.1 C)   Resp 18   SpO2 100%  Physical Exam General: Overweight Cardiac: Normal heart rate Lungs: No respiratory distress Psych: Responding to internal stimuli, laughing inappropriately  ED Course / MDM  EKG:    I have reviewed the labs performed to date as well as medications administered while in observation.  Recent changes in the last 24 hours include has remained cooperative in taking medications.  Plan  Current plan is for placement in a psychiatric facility. Patient is under full IVC at this time.   Mancel Bale, MD 09/10/20 0740  12:30 PM-follow-up after seen by psychiatry today, they plan on admitting for inpatient psychiatric treatment.   Mancel Bale, MD 09/10/20 1342   2:20 PM-I was called by nursing because the patient walked out of her room and collapsed to the floor. As I arrived on the unit she was lying on the floor, supine, not verbalizing anything and in no apparent distress. There is no visualized external injury to the head, face, neck, arms or legs. Pupils equal round reactive to light. She did not respond verbally when I talk to her. Since she did not appear to have any injuries, I asked staff to place her in bed. At that time she was able to sit up, look around the room and appeared to be at her baseline, consistent with when I saw her earlier today. I asked the nurse to restrain for additional efforts to ambulate without direction.    Mancel Bale, MD 09/10/20 [phone]

## 2020-09-10 NOTE — BH Assessment (Addendum)
Pt was reviewed by Dr. Les Pou at [facility] for potential placement and has been declined due to pt currently being placed in restraints and requiring PRN medication.

## 2020-09-10 NOTE — ED Notes (Signed)
Pt appears to be responding to internal stimuli.  She got up again as if she were going to elope out of here. Stopped when she saw security. She is not eating and drinking today.

## 2020-09-11 NOTE — ED Notes (Signed)
Pt cooperative this shift. No aggression noted.

## 2020-09-11 NOTE — ED Notes (Addendum)
This RN went went in and gave the patient her bedtime medications, she put them in her mouth, she gagged and then spit them out into the cup of ice water, when RN asked the patient if she was nauseous or if she choked, she stated " I'm not taking them, I don't need those".  This RN then walked outside of the room the patient ran out through the TCU doors, patient was stopped just outside of the doors, assisted to the floor, and security escorted her back to her bed.  ED provider to be notified.

## 2020-09-11 NOTE — Consult Note (Addendum)
[facility] Psych ED Progress Note  09/11/2020 10:12 AM Lindsey Richmond  MRN:  [id number]   Interim Progress Note 09/11/2020: Lindsey Richmond, 24 y.o., female patient seen via telepsych by this provider; chart reviewed and consulted with Dr. Jannifer Franklin on 09/11/20.  On evaluation Lindsey Richmond states her name and states, " I am doing okay."  She is unable to provide information regarding HPI but aware she is in the hospital when asked the reason she states, "depression and anxiety."  She demonstrates psycho motor retardation, appears to be responding to internal stimuls but denies audible or visual hallucinations when asked.  She is prescribed risperdal 6mg  po qhs and also prescribed prn medications for agitation.  Since arrival has required prn for elopement attempts or agitation.  She denies medication side effects.    Over the past 24 hours there is some improvement in her presentation in that she now communicates verbally, is eating and drinking fluids, is less agitated and is redirectable.  She is not at baseline and would benefit from continued stay.   Progress Note 09/10/2020: Subjective: Patient presents with somewhat disorganized conversation states the words "hurt" and "Jesus."  Patient assessed by nurse practitioner.  Patient alert and oriented, does not participate verbally in assessment past initial few minutes.  Patient denies suicidal and homicidal ideations.  Patient denies auditory and visual hallucinations however appears to look for wall when no one is speaking.  Patient appears to look toward ceiling prior to verbally responding to assessment questions.  Patient appears to be responding to internal stimuli at this time.  Attempted to discuss treatment plan with patient who does not respond verbally but does shake head to demonstrate understanding.  Patient offered support and encouragement.  Patient gives verbal consent to speak with her mother regarding her care.  Telephone patient's mother,  [name] phone number [phone]  Spoke with patient's mother who reports patient receives injectable Invega monthly at [facility] outpatient.  Patient's mother reports next dose is due September 14, 2020.  Patient's mother reports she believes patient has not been taking her oral medications possibly "for a while may be September."  Principal Problem: Paranoid schizophrenia (HCC) Diagnosis:  Principal Problem:   Paranoid schizophrenia (HCC)  Total Time spent with patient: 20 minutes  Past Psychiatric History: Paranoid schizophrenia, schizoaffective schizophrenia, schizoaffective disorder, bipolar type  Past Medical History:  Past Medical History:  Diagnosis Date  . Depression   . Oppositional defiant behavior   . Schizo affective schizophrenia (HCC)   . Schizophrenia (HCC)    History reviewed. No pertinent surgical history. Family History:  Family History  Problem Relation Age of Onset  . Heart failure Mother   . Hypertension Mother   . Diabetes Father    Family Psychiatric  History: None reported Social History:  Social History   Substance and Sexual Activity  Alcohol Use No     Social History   Substance and Sexual Activity  Drug Use No    Social History   Socioeconomic History  . Marital status: Single    Spouse name: Not on [place]  . Number of children: Not on [place]  . Years of education: Not on [place]  . Highest education level: Not on [place]  Occupational History  . Occupation: Unemployed  Tobacco Use  . Smoking status: Never Smoker  . Smokeless tobacco: Never Used  Vaping Use  . Vaping Use: Never used  Substance and Sexual Activity  . Alcohol use: No  . Drug use: No  .  Sexual activity: Never    Birth control/protection: None  Other Topics Concern  . Not on [place]  Social History Narrative   ** Merged History Encounter **       Pt lives with her mother, brother, and father.  She receives outpatient psychiatric care through [facility].   Social  Determinants of Health   Financial Resource Strain: Not on [place]  Food Insecurity: Not on [place]  Transportation Needs: Not on [place]  Physical Activity: Not on [place]  Stress: Not on [place]  Social Connections: Not on [place]    Sleep: Fair  Appetite:  Fair, improving  Current Medications: Current Facility-Administered Medications  Medication Dose Route Frequency Provider Last Rate Last Admin  . acetaminophen (TYLENOL) tablet 650 mg  650 mg Oral Q4H PRN Jacalyn Lefevre, MD      . alum & mag hydroxide-simeth (MAALOX/MYLANTA) 200-200-20 MG/5ML suspension 30 mL  30 mL Oral Q6H PRN Jacalyn Lefevre, MD      . benztropine (COGENTIN) tablet 0.5 mg  0.5 mg Oral BID PRN Jacalyn Lefevre, MD      . cloNIDine (CATAPRES) tablet 0.2 mg  0.2 mg Oral BID Jacalyn Lefevre, MD   0.2 mg at 09/10/20 2136  . ziprasidone (GEODON) injection 20 mg  20 mg Intramuscular Q12H PRN Patrcia Dolly, FNP   20 mg at 09/10/20 1539   And  . LORazepam (ATIVAN) tablet 1 mg  1 mg Oral PRN Patrcia Dolly, FNP      . ondansetron [facility]) tablet 4 mg  4 mg Oral Q8H PRN Jacalyn Lefevre, MD      . risperiDONE (RISPERDAL) tablet 6 mg  6 mg Oral QHS Jacalyn Lefevre, MD   6 mg at 09/10/20 2135  . traZODone (DESYREL) tablet 150 mg  150 mg Oral QHS Jacalyn Lefevre, MD   150 mg at 09/10/20 2136   Current Outpatient Medications  Medication Sig Dispense Refill  . benztropine (COGENTIN) 0.5 MG tablet Take 1 tablet (0.5 mg total) by mouth 2 (two) times daily as needed for tremors (EPS). 60 tablet 2  . cloNIDine (CATAPRES) 0.2 MG tablet Take 1 tablet (0.2 mg total) by mouth 2 (two) times daily. 60 tablet 2  . paliperidone (INVEGA SUSTENNA) 156 MG/ML SUSY injection Inject 1 mL (156 mg total) into the muscle every 30 (thirty) days. 1.2 mL 11  . risperiDONE (RISPERDAL) 3 MG tablet Take 2 tablets (6 mg total) by mouth at bedtime. 60 tablet 3  . traZODone (DESYREL) 150 MG tablet Take 150 mg by mouth at bedtime.      Lab Results:  Results for orders  placed or performed during the hospital encounter of 09/09/20 (from the past 48 hour(s))  Urine rapid drug screen (hosp performed)     Status: None   Collection Time: 09/09/20 10:43 AM  Result Value Ref Range   Opiates NONE DETECTED NONE DETECTED   Cocaine NONE DETECTED NONE DETECTED   Benzodiazepines NONE DETECTED NONE DETECTED   Amphetamines NONE DETECTED NONE DETECTED   Tetrahydrocannabinol NONE DETECTED NONE DETECTED   Barbiturates NONE DETECTED NONE DETECTED    Comment: (NOTE) DRUG SCREEN FOR MEDICAL PURPOSES ONLY.  IF CONFIRMATION IS NEEDED FOR ANY PURPOSE, NOTIFY LAB WITHIN 5 DAYS.  LOWEST DETECTABLE LIMITS FOR URINE DRUG SCREEN Drug Class                     Cutoff (ng/mL) Amphetamine and metabolites    1000 Barbiturate and metabolites    200  Benzodiazepine                 200 Tricyclics and metabolites     300 Opiates and metabolites        300 Cocaine and metabolites        300 THC                            50 Performed at [facility], [address] [place], [place] [address]   Urinalysis, Routine w reflex microscopic Urine, Clean Catch     Status: Abnormal   Collection Time: 09/09/20 10:43 AM  Result Value Ref Range   Color, Urine YELLOW YELLOW   APPearance TURBID (A) CLEAR   Specific Gravity, Urine 1.033 (H) 1.005 - 1.030   pH 5.0 5.0 - 8.0   Glucose, UA NEGATIVE NEGATIVE mg/dL   Hgb urine dipstick NEGATIVE NEGATIVE   Bilirubin Urine NEGATIVE NEGATIVE   Ketones, ur 5 (A) NEGATIVE mg/dL   Protein, ur 604 (A) NEGATIVE mg/dL   Nitrite NEGATIVE NEGATIVE   Leukocytes,Ua MODERATE (A) NEGATIVE   RBC / HPF 0-5 0 - 5 RBC/hpf   WBC, UA 6-10 0 - 5 WBC/hpf   Bacteria, UA NONE SEEN NONE SEEN   Squamous Epithelial / LPF 0-5 0 - 5   Mucus PRESENT    Amorphous Crystal PRESENT    Crystals PRESENT (A) NEGATIVE    Comment: Performed at [facility], [address], [place], [place] [address]  Resp Panel by RT-PCR (Flu A&B, Covid)  Nasopharyngeal Swab     Status: None   Collection Time: 09/09/20  2:55 PM   Specimen: Nasopharyngeal Swab; Nasopharyngeal(NP) swabs in vial transport medium  Result Value Ref Range   SARS Coronavirus 2 by RT PCR NEGATIVE NEGATIVE    Comment: (NOTE) SARS-CoV-2 target nucleic acids are NOT DETECTED.  The SARS-CoV-2 RNA is generally detectable in upper respiratory specimens during the acute phase of infection. The lowest concentration of SARS-CoV-2 viral copies this assay can detect is 138 copies/mL. A negative result does not preclude SARS-Cov-2 infection and should not be used as the sole basis for treatment or other patient management decisions. A negative result may occur with  improper specimen collection/handling, submission of specimen other than nasopharyngeal swab, presence of viral mutation(s) within the areas targeted by this assay, and inadequate number of viral copies(<138 copies/mL). A negative result must be combined with clinical observations, patient history, and epidemiological information. The expected result is Negative.  Fact Sheet for Patients:  BloggerCourse.com  Fact Sheet for Healthcare Providers:  SeriousBroker.it  This test is no t yet approved or cleared by the [place] FDA and  has been authorized for detection and/or diagnosis of SARS-CoV-2 by FDA under an Emergency Use Authorization (EUA). This EUA will remain  in effect (meaning this test can be used) for the duration of the COVID-19 declaration under Section 564(b)(1) of the Act, 21 U.S.C.section 360bbb-3(b)(1), unless the authorization is terminated  or revoked sooner.       Influenza A by PCR NEGATIVE NEGATIVE   Influenza B by PCR NEGATIVE NEGATIVE    Comment: (NOTE) The Xpert Xpress SARS-CoV-2/FLU/RSV plus assay is intended as an aid in the diagnosis of influenza from Nasopharyngeal swab specimens and should not be used as a sole basis for  treatment. Nasal washings and aspirates are unacceptable for Xpert Xpress SARS-CoV-2/FLU/RSV testing.  Fact Sheet for Patients: BloggerCourse.com  Fact  Sheet for Healthcare Providers: SeriousBroker.it  This test is not yet approved or cleared by the [place] and has been authorized for detection and/or diagnosis of SARS-CoV-2 by FDA under an Emergency Use Authorization (EUA). This EUA will remain in effect (meaning this test can be used) for the duration of the COVID-19 declaration under Section 564(b)(1) of the Act, 21 U.S.C. section 360bbb-3(b)(1), unless the authorization is terminated or revoked.  Performed at [facility], [address], [place], [place] [address]   hCG, quantitative, pregnancy     Status: None   Collection Time: 09/09/20  3:19 PM  Result Value Ref Range   hCG, Beta Chain, Quant, S <1 <5 mIU/mL    Comment:          GEST. AGE      CONC.  (mIU/mL)   <=1 WEEK        5 - 50     2 WEEKS       50 - 500     3 WEEKS       100 - 10,000     4 WEEKS     1,000 - 30,000     5 WEEKS     3,500 - 115,000   6-8 WEEKS     12,000 - 270,000    12 WEEKS     15,000 - 220,000        FEMALE AND NON-PREGNANT FEMALE:     LESS THAN 5 mIU/mL Performed at [facility], [address], [place], [place] [address]   Comprehensive metabolic panel     Status: Abnormal   Collection Time: 09/09/20  3:28 PM  Result Value Ref Range   Sodium 138 135 - 145 mmol/L   Potassium 4.1 3.5 - 5.1 mmol/L   Chloride 101 98 - 111 mmol/L   CO2 25 22 - 32 mmol/L   Glucose, Bld 98 70 - 99 mg/dL    Comment: Glucose reference range applies only to samples taken after fasting for at least 8 hours.   BUN 13 6 - 20 mg/dL   Creatinine, Ser 0.98 0.44 - 1.00 mg/dL   Calcium 9.7 8.9 - 11.9 mg/dL   Total Protein 14.7 (H) 6.5 - 8.1 g/dL   Albumin 4.0 3.5 - 5.0 g/dL   AST 26 15 - 41 U/L   ALT 19 0 - 44 U/L    Alkaline Phosphatase 57 38 - 126 U/L   Total Bilirubin 0.8 0.3 - 1.2 mg/dL   GFR, Estimated >82 >95 mL/min    Comment: (NOTE) Calculated using the CKD-EPI Creatinine Equation (2021)    Anion gap 12 5 - 15    Comment: Performed at [facility], [address], [place], [place] [address]  Ethanol     Status: None   Collection Time: 09/09/20  3:28 PM  Result Value Ref Range   Alcohol, Ethyl (B) <10 <10 mg/dL    Comment: (NOTE) Lowest detectable limit for serum alcohol is 10 mg/dL.  For medical purposes only. Performed at [facility], [address] [place], [place] [address]   CBC with Diff     Status: Abnormal   Collection Time: 09/09/20  3:28 PM  Result Value Ref Range   WBC 11.2 (H) 4.0 - 10.5 K/uL   RBC 4.25 3.87 - 5.11 MIL/uL   Hemoglobin 13.1 12.0 - 15.0 g/dL   HCT 46.9 62.9 - 52.8 %   MCV 93.6 80.0 -  100.0 fL   MCH 30.8 26.0 - 34.0 pg   MCHC 32.9 30.0 - 36.0 g/dL   RDW 62.113.2 30.811.5 - 65.715.5 %   Platelets 302 150 - 400 K/uL   nRBC 0.0 0.0 - 0.2 %   Neutrophils Relative % 69 %   Neutro Abs 7.7 1.7 - 7.7 K/uL   Lymphocytes Relative 21 %   Lymphs Abs 2.3 0.7 - 4.0 K/uL   Monocytes Relative 9 %   Monocytes Absolute 1.0 0.1 - 1.0 K/uL   Eosinophils Relative 1 %   Eosinophils Absolute 0.1 0.0 - 0.5 K/uL   Basophils Relative 0 %   Basophils Absolute 0.0 0.0 - 0.1 K/uL   Immature Granulocytes 0 %   Abs Immature Granulocytes 0.02 0.00 - 0.07 K/uL    Comment: Performed at [facility], [address] [place], [place] [id number]  CBG monitoring, ED     Status: None   Collection Time: 09/10/20  2:24 PM  Result Value Ref Range   Glucose-Capillary 95 70 - 99 mg/dL    Comment: Glucose reference range applies only to samples taken after fasting for at least 8 hours.    Blood Alcohol level:  Lab Results  Component Value Date   ETH <10 09/09/2020   ETH <10 01/04/2020    Physical Findings: AIMS:  , ,  ,  ,    CIWA:     COWS:     Musculoskeletal: Strength & Muscle Tone: within normal limits Gait & Station: normal Patient leans: N/A  Psychiatric Specialty Exam: Physical Exam Vitals and nursing note reviewed.  Constitutional:      Appearance: She is well-developed.  HENT:     Head: Normocephalic.  Cardiovascular:     Rate and Rhythm: Normal rate.  Pulmonary:     Effort: Pulmonary effort is normal.  Neurological:     Mental Status: She is alert.     Comments: Oriented to person and place  Psychiatric:        Attention and Perception: She is inattentive.        Mood and Affect: Affect is blunt.        Speech: Speech is delayed and tangential.        Behavior: Behavior is cooperative.        Thought Content: Thought content is paranoid.        Judgment: Judgment is inappropriate.     Review of Systems  Constitutional: Negative.   HENT: Negative.   Eyes: Negative.   Respiratory: Negative.   Cardiovascular: Negative.   Gastrointestinal: Negative.   Genitourinary: Negative.   Musculoskeletal: Negative.   Skin: Negative.   Neurological: Negative.   Psychiatric/Behavioral: Positive for confusion, dysphoric mood and hallucinations.    Blood pressure (!) 128/93, pulse (!) 102, temperature 98.2 F (36.8 C), temperature source Axillary, resp. rate 18, SpO2 98 %.There is no height or weight on [place] to calculate BMI.  General Appearance: Bizarre, Disheveled and patient's hair is uncombed and sitting up on her head  Eye Contact:  Minimal  Speech:  Slow but improving  Volume:  Decreased  Mood:  Dysphoric  Affect:  Congruent and Flat  Thought Process:  Disorganized and Descriptions of Associations: Loose  Orientation:  Other:  oriented to person and place  Thought Content:  Tangential  Suicidal Thoughts:  No  Homicidal Thoughts:  No  Memory:  Immediate;   Fair Recent;   Fair Remote;   Fair  Judgement:  Impaired  Insight:  Lacking  Psychomotor Activity:  Normal  Concentration:   Concentration: Fair and Attention Span: Fair  Recall:  [employer] of Knowledge:  Fair  Language:  Fair  Akathisia:  No  Handed:  Right  AIMS (if indicated):     Assets:  Communication Skills Desire for Improvement Financial Resources/Insurance Housing Intimacy Leisure Time Physical Health Resilience Social Support  ADL's:  Intact  Cognition:  WNL  Sleep:   impaired      Treatment Plan Summary: Daily contact with patient to assess and evaluate symptoms and progress in treatment.  Patient with schizoaffective disorder, here with acute psychosis secondary to medication non-adherence.  Improving with psychotropic medications but not at baseline.  Her impaired cognition, judgement and insight presents high safety risk and continues to justify the need for continued inpatient for mood stabilization. This was discussed with the patient who appears to understand the rationale for her continued stay.    She needs a baseline EKG.  Continue to recommend inpatient psychiatric treatment and medications as listed below. UDS is negative.   Medications, restarted home medications including: -Benztropine 0.5 mg twice daily as needed/tremors -Clonipin 0.2 mg twice daily -Risperidone 6 mg nightly -Trazodone 150 mg nightly   Communicated with Dr. Effie Shy, EDP; Meredity Victor, SW, and Murrell Redden SW who is helping with [facility] assignments.  informed of above recommendation and disposition.    Chales Abrahams, NP 09/11/2020, 10:12 AM  Patient seen via tele health for psychiatric evaluation, chart reviewed and case discussed with the physician extender and developed treatment plan. Reviewed the information documented and agree with the treatment plan. Thedore Mins, MD

## 2020-09-11 NOTE — ED Notes (Signed)
Pt alert this afternoon. Flat. Dull. Withdrawn.Guarded. Cooperative.

## 2020-09-11 NOTE — ED Notes (Signed)
Pt given dinner tray.

## 2020-09-11 NOTE — ED Notes (Signed)
Patient was attempting to get up, was asked if she needed something or to go to the bathroom, she tried to leave the room.  RN and sitter asked the patient to get back in the bed and she attempted to push passed [place].  When she was asked again, she complied.

## 2020-09-11 NOTE — ED Notes (Signed)
Pt is up and eating breakfast.

## 2020-09-11 NOTE — ED Notes (Signed)
Dr. Greer Pickerel and Leta Jungling, charge RN made aware.

## 2020-09-11 NOTE — ED Notes (Signed)
Pt blunted, flat, guarded. Medication compliant this AM. Pt pt ate breakfast. Pt refused shower. Pt disheveled.

## 2020-09-11 NOTE — ED Provider Notes (Signed)
Emergency Medicine Observation Re-evaluation Note  Lindsey Richmond is a 24 y.o. female, seen on rounds today.  Pt initially presented to the ED for complaints of Psychiatric Evaluation (Patient nonverbal, not responding to simple stimuli-unsure of when she last took psyche meds) and IVC Currently, the patient is sitting in bed, cooperative and respectful.  Physical Exam  BP (!) 128/93 (BP Location: Left Arm)   Pulse (!) 102   Temp 98.2 F (36.8 C) (Axillary)   Resp 18   SpO2 98%  Physical Exam General: Overweight Cardiac: Mild tachycardia Lungs: No respiratory distress Psych: Responding to internal stimuli  ED Course / MDM  EKG:    I have reviewed the labs performed to date as well as medications administered while in observation.  Recent changes in the last 24 hours include she has remained stable.  She occasionally wanders and has to be redirected.  She went to ground while walking yesterday but did not injure herself.  There is no ongoing discomfort or injury.  According to nursing she has not been restrained since yesterday.  She was apparently declined because of restraints but appears to be cooperating at this time.  Plan  Current plan is for psychiatric hospitalization. Patient is under full IVC at this time.   Mancel Bale, MD 09/11/20 [phone]

## 2020-09-12 LAB — CBC WITH DIFFERENTIAL/PLATELET
Abs Immature Granulocytes: 0.02 10*3/uL (ref 0.00–0.07)
Basophils Absolute: 0 10*3/uL (ref 0.0–0.1)
Basophils Relative: 0 %
Eosinophils Absolute: 0.1 10*3/uL (ref 0.0–0.5)
Eosinophils Relative: 1 %
HCT: 39.5 % (ref 36.0–46.0)
Hemoglobin: 12.6 g/dL (ref 12.0–15.0)
Immature Granulocytes: 0 %
Lymphocytes Relative: 17 %
Lymphs Abs: 1.3 10*3/uL (ref 0.7–4.0)
MCH: 30.4 pg (ref 26.0–34.0)
MCHC: 31.9 g/dL (ref 30.0–36.0)
MCV: 95.4 fL (ref 80.0–100.0)
Monocytes Absolute: 0.5 10*3/uL (ref 0.1–1.0)
Monocytes Relative: 7 %
Neutro Abs: 5.5 10*3/uL (ref 1.7–7.7)
Neutrophils Relative %: 75 %
Platelets: 279 10*3/uL (ref 150–400)
RBC: 4.14 MIL/uL (ref 3.87–5.11)
RDW: 12.9 % (ref 11.5–15.5)
WBC: 7.4 10*3/uL (ref 4.0–10.5)
nRBC: 0 % (ref 0.0–0.2)

## 2020-09-12 LAB — BASIC METABOLIC PANEL
Anion gap: 13 (ref 5–15)
BUN: 15 mg/dL (ref 6–20)
CO2: 23 mmol/L (ref 22–32)
Calcium: 9.3 mg/dL (ref 8.9–10.3)
Chloride: 102 mmol/L (ref 98–111)
Creatinine, Ser: 1.05 mg/dL — ABNORMAL HIGH (ref 0.44–1.00)
GFR, Estimated: >60 mL/min (ref >60)
Glucose, Bld: 108 mg/dL — ABNORMAL HIGH (ref 70–99)
Potassium: 3.7 mmol/L (ref 3.5–5.1)
Sodium: 138 mmol/L (ref 135–145)

## 2020-09-12 LAB — CK: Total CK: 891 U/L — ABNORMAL HIGH (ref 38–234)

## 2020-09-12 MED ORDER — BENZTROPINE MESYLATE 0.5 MG PO TABS: 0.5000 mg | ORAL_TABLET | Freq: Two times a day (BID) | ORAL | Status: DC

## 2020-09-12 MED ORDER — SODIUM CHLORIDE 0.9 % IV BOLUS
1000.0000 mL | Freq: Once | INTRAVENOUS | Status: AC
  Administered 2020-09-12: 17:00:00 1000 mL via INTRAVENOUS

## 2020-09-12 MED ORDER — RISPERIDONE 2 MG PO TABS
3.0000 mg | ORAL_TABLET | Freq: Every day | ORAL | Status: DC
  Filled 2020-09-12: qty 1

## 2020-09-12 MED ORDER — PALIPERIDONE PALMITATE ER 156 MG/ML IM SUSY
156.0000 mg | PREFILLED_SYRINGE | Freq: Once | INTRAMUSCULAR | Status: AC
  Administered 2020-09-13: 14:00:00 156 mg via INTRAMUSCULAR
  Filled 2020-09-12: qty 1

## 2020-09-12 MED ORDER — STERILE WATER FOR INJECTION IJ SOLN
INTRAMUSCULAR | Status: AC
  Filled 2020-09-12: qty 10

## 2020-09-12 MED ORDER — LORAZEPAM 2 MG/ML IJ SOLN
1.0000 mg | Freq: Once | INTRAMUSCULAR | Status: AC
  Administered 2020-09-12: 11:00:00 1 mg via INTRAMUSCULAR
  Filled 2020-09-12: qty 1

## 2020-09-12 MED ORDER — SODIUM CHLORIDE 0.9 % IV BOLUS
1000.0000 mL | Freq: Once | INTRAVENOUS | Status: AC
  Administered 2020-09-12: 18:00:00 1000 mL via INTRAVENOUS

## 2020-09-12 NOTE — Consult Note (Signed)
[facility] Progress Note  09/12/2020 9:58 AM Lindsey FileMaiya [place]  MRN:  [id number]   Interim Progress Notes 09/12/2020: Per nursing notes, patient refused her psychiatric medications last night.  She also left her room yesterday and had to be escorted back with the assistance of security but did not require prn sedation for agitation.  She refused her blood pressure medication today and did not eat breakfast.  Her confusion has increased and she is seen staring off into space.  This is a change from yesterday as she was verbal and appeared more coherent although she was not at baseline.   On assessment today she responds with "I'm okay" regardless of the question.  She is seated on the bed, appears mildly diaphoretic but otherwise no distress noted.  Nursing is at the bedside encouraging patient to drink fluids and take medications.     Interim Progress Note 09/11/2020: Lindsey [place], 24 y.o., female patient seen via telepsych by this provider; chart reviewed and consulted with Dr. Jannifer FranklinAkintayo on 09/12/20.  On evaluation Lindsey FileMaiya Cairns states her name and states, " I am doing okay."  She is unable to provide information regarding HPI but aware she is in the hospital when asked the reason she states, "depression and anxiety."  She demonstrates psycho motor retardation, appears to be responding to internal stimuls but denies audible or visual hallucinations when asked.  She is prescribed risperdal 6mg  po qhs and also prescribed prn medications for agitation.  Since arrival has required prn for elopement attempts or agitation.  She denies medication side effects.    Over the past 24 hours there is some improvement in her presentation in that she now communicates verbally, is eating and drinking fluids, is less agitated and is redirectable.  She is not at baseline and would benefit from continued stay.   Progress Note 09/10/2020: Subjective: Patient presents with somewhat disorganized conversation states the words  "hurt" and "Jesus."  Patient assessed by nurse practitioner.  Patient alert and oriented, does not participate verbally in assessment past initial few minutes.  Patient denies suicidal and homicidal ideations.  Patient denies auditory and visual hallucinations however appears to look for wall when no one is speaking.  Patient appears to look toward ceiling prior to verbally responding to assessment questions.  Patient appears to be responding to internal stimuli at this time.  Attempted to discuss treatment plan with patient who does not respond verbally but does shake head to demonstrate understanding.  Patient offered support and encouragement.  Patient gives verbal consent to speak with her mother regarding her care.  Telephone patient's mother, Heide ScalesDiana Brekke phone number [phone]  Spoke with patient's mother who reports patient [facility] In[facility].  Patient's mother reports next dose is due September 14, 2020.  Patient's mother reports she believes patient has not been taking her oral medications possibly "for a while may be September."  Principal Problem: Paranoid schizophrenia (HCC) Diagnosis:  Principal Problem:   Paranoid schizophrenia (HCC)  Total Time spent with patient: 20 minutes  Past Psychiatric History: Paranoid schizophrenia, schizoaffective schizophrenia, schizoaffective disorder, bipolar type  Past Medical History:  Past Medical History:  Diagnosis Date  . Depression   . Oppositional defiant behavior   . Schizo affective schizophrenia (HCC)   . Schizophrenia (HCC)    History reviewed. No pertinent surgical history. Family History:  Family History  Problem Relation Age of Onset  . Heart failure Mother   . Hypertension Mother   . Diabetes  Father    Family Psychiatric  History: None reported Social History:  Social History   Substance and Sexual Activity  Alcohol Use No     Social History   Substance and Sexual  Activity  Drug Use No    Social History   Socioeconomic History  . Marital status: Single    Spouse name: Not on file  . Number of children: Not on file  . Years of education: Not on file  . Highest education level: Not on file  Occupational History  . Occupation: Unemployed  Tobacco Use  . Smoking status: Never Smoker  . Smokeless tobacco: Never Used  Vaping Use  . Vaping Use: Never used  Substance and Sexual Activity  . Alcohol use: No  . Drug use: No  . Sexual activity: Never    Birth control/protection: None  Other Topics Concern  . Not on file  Social History Narrative   ** Merged History Encounter **       Pt lives with her mother, brother, and father.  She receives outpatient psychiatric care through [facility].   Social Determinants of Health   Financial Resource Strain: Not on file  Food Insecurity: Not on file  Transportation Needs: Not on file  Physical Activity: Not on file  Stress: Not on file  Social Connections: Not on file    Sleep: Fair  Appetite:  Fair, improving  Current Medications: Current Facility-Administered Medications  Medication Dose Route Frequency Provider Last Rate Last Admin  . acetaminophen (TYLENOL) tablet 650 mg  650 mg Oral Q4H PRN Jacalyn Lefevre, MD      . alum & mag hydroxide-simeth (MAALOX/MYLANTA) 200-200-20 MG/5ML suspension 30 mL  30 mL Oral Q6H PRN Jacalyn Lefevre, MD      . benztropine (COGENTIN) tablet 0.5 mg  0.5 mg Oral BID PRN Jacalyn Lefevre, MD      . cloNIDine (CATAPRES) tablet 0.2 mg  0.2 mg Oral BID Jacalyn Lefevre, MD   0.2 mg at 09/11/20 1034  . ziprasidone (GEODON) injection 20 mg  20 mg Intramuscular Q12H PRN Patrcia Dolly, FNP   20 mg at 09/11/20 2130   And  . LORazepam (ATIVAN) tablet 1 mg  1 mg Oral PRN Patrcia Dolly, FNP      . ondansetron [facility]) tablet 4 mg  4 mg Oral Q8H PRN Jacalyn Lefevre, MD      . risperiDONE (RISPERDAL) tablet 6 mg  6 mg Oral QHS Jacalyn Lefevre, MD   6 mg at 09/10/20 2135  .  traZODone (DESYREL) tablet 150 mg  150 mg Oral QHS Jacalyn Lefevre, MD   150 mg at 09/10/20 2136   Current Outpatient Medications  Medication Sig Dispense Refill  . benztropine (COGENTIN) 0.5 MG tablet Take 1 tablet (0.5 mg total) by mouth 2 (two) times daily as needed for tremors (EPS). 60 tablet 2  . cloNIDine (CATAPRES) 0.2 MG tablet Take 1 tablet (0.2 mg total) by mouth 2 (two) times daily. 60 tablet 2  . paliperidone (INVEGA SUSTENNA) 156 MG/ML SUSY injection Inject 1 mL (156 mg total) into the muscle every 30 (thirty) days. 1.2 mL 11  . risperiDONE (RISPERDAL) 3 MG tablet Take 2 tablets (6 mg total) by mouth at bedtime. 60 tablet 3  . traZODone (DESYREL) 150 MG tablet Take 150 mg by mouth at bedtime.      Lab Results:  Results for orders placed or performed during the hospital encounter of 09/09/20 (from the past 48 hour(s))  CBG  monitoring, ED     Status: None   Collection Time: 09/10/20  2:24 PM  Result Value Ref Range   Glucose-Capillary 95 70 - 99 mg/dL    Comment: Glucose reference range applies only to samples taken after fasting for at least 8 hours.    Blood Alcohol level:  Lab Results  Component Value Date   ETH <10 09/09/2020   ETH <10 01/04/2020    Physical Findings: AIMS:  , ,  ,  ,    CIWA:    COWS:     Musculoskeletal: Strength & Muscle Tone: within normal limits Gait & Station: normal Patient leans: N/A  Psychiatric Specialty Exam: Physical Exam Vitals and nursing note reviewed.  Constitutional:      Appearance: She is well-developed.  HENT:     Head: Normocephalic.  Cardiovascular:     Rate and Rhythm: Tachycardia present.  Pulmonary:     Effort: Pulmonary effort is normal.  Neurological:     Mental Status: She is alert.     Comments: Oriented to person and place  Psychiatric:        Attention and Perception: She is inattentive.        Mood and Affect: Affect is blunt.        Speech: Speech is delayed and tangential.        Behavior:  Behavior is cooperative.        Thought Content: Thought content is paranoid.        Judgment: Judgment is inappropriate.     Review of Systems  Constitutional: Negative.   HENT: Negative.   Eyes: Negative.   Respiratory: Negative.   Cardiovascular: Negative.   Gastrointestinal: Negative.   Genitourinary: Negative.   Musculoskeletal: Negative.   Skin: Negative.   Neurological: Negative.   Psychiatric/Behavioral: Positive for confusion, decreased concentration, dysphoric mood and hallucinations.    Blood pressure (!) 152/99, pulse (!) 130, temperature 99.9 F (37.7 C), temperature source Oral, resp. rate 20, SpO2 98 %.There is no height or weight on file to calculate BMI.  General Appearance: Bizarre, Disheveled and patient's hair is uncombed and sitting up on her head  Eye Contact:  Poor   Speech:  Slow  Volume:  Decreased  Mood:  Dysphoric  Affect:  Congruent and Flat  Thought Process:  Disorganized and Descriptions of Associations: Loose  Orientation:  Other:  oriented to person and place  Thought Content:  Tangential  Suicidal Thoughts:  No  Homicidal Thoughts:  No  Memory:  Immediate;   Fair Recent;   Fair Remote;   Fair  Judgement:  Impaired  Insight:  Lacking  Psychomotor Activity:  Normal  Concentration:  Concentration: Fair and Attention Span: Fair  Recall:  [employer] of Knowledge:  Fair  Language:  Fair  Akathisia:  No  Handed:  Right  AIMS (if indicated):     Assets:  Communication Skills Desire for Improvement Financial Resources/Insurance Housing Intimacy Leisure Time Physical Health Resilience Social Support  ADL's:  Intact  Cognition:  WNL and Impaired,  Mild  Sleep:   impaired    Treatment Plan Summary: Daily contact with patient to assess and evaluate symptoms and progress in treatment.  Continue to recommend inpatient psychiatric admission.   Continue Medications: -Benztropine 0.5 mg twice daily as needed/tremors -Clonipin 0.2 mg po BID  for blood pressure -Risperdal 6mg  po qhs reduced to 3mg  po nightly  -Trazodone 150 mg nightly  Start   Paliperidone injection 156mg  IM once today  Communicated above disposition and with Dr. Huey Romans, EDP and Vira Blanco, [facility], [facility].     Chales Abrahams, NP 09/12/2020, 9:58 AM

## 2020-09-12 NOTE — ED Provider Notes (Signed)
Called to bedside by staff.  Patient with decreased PO intake today.   Patient had just received sedation for agitation just prior to my arrival on shift.   Staff concerned that patient appears less interactive today and is less cooperative.  Repeat labs obtained. CK 891. Cr 1.05.  IVF given for likely dehydration. EKG (09/12/2020 at 1658) shows mild sinus tach.   Suspect dehydration secondary to schizophrenia/sedation.    Wynetta Fines, MD 09/12/20 [phone]

## 2020-09-12 NOTE — ED Notes (Signed)
Pt remains out of restraints  Is nonverbal but following staff redirection at this time. Refused 2200 medications.

## 2020-09-12 NOTE — ED Notes (Signed)
Pt continues to stare straight forward, no really moving. Pt continues to respond with "I am fine.  Pt not eating or drinking. Pt has been encouraged and reassured.

## 2020-09-12 NOTE — ED Notes (Addendum)
Pt trying to leaving, pushing staff, uncooperative with care. MD notified. Orders placed(see orders).  PRN Geodon 20 mg given

## 2020-09-12 NOTE — ED Notes (Signed)
Pt refused to eat her meal

## 2020-09-12 NOTE — ED Notes (Signed)
Pt alert. Flat, Blank stare, Blunted.

## 2020-09-12 NOTE — ED Notes (Signed)
Pt did not eat breakfast . Pt not answering questions. Pt pt staring .   Pt refused medication.

## 2020-09-13 ENCOUNTER — Encounter (HOSPITAL_COMMUNITY): Payer: Self-pay

## 2020-09-13 ENCOUNTER — Other Ambulatory Visit: Payer: Self-pay

## 2020-09-13 ENCOUNTER — Inpatient Hospital Stay (HOSPITAL_COMMUNITY)
Admission: AD | Admit: 2020-09-13 | Discharge: 2020-09-24 | DRG: 885 | Disposition: A | Discharge status: Home / Self Care | Payer: Medicaid Other | Source: Intra-hospital | Attending: Psychiatry | Admitting: Psychiatry

## 2020-09-13 DIAGNOSIS — G4700 Insomnia, unspecified: Secondary | ICD-10-CM | POA: Diagnosis present

## 2020-09-13 DIAGNOSIS — F913 Oppositional defiant disorder: Secondary | ICD-10-CM | POA: Diagnosis present

## 2020-09-13 DIAGNOSIS — Z79899 Other long term (current) drug therapy: Secondary | ICD-10-CM

## 2020-09-13 DIAGNOSIS — I10 Essential (primary) hypertension: Secondary | ICD-10-CM | POA: Diagnosis present

## 2020-09-13 DIAGNOSIS — F32A Depression, unspecified: Secondary | ICD-10-CM | POA: Diagnosis present

## 2020-09-13 DIAGNOSIS — R000 Tachycardia, unspecified: Secondary | ICD-10-CM | POA: Diagnosis not present

## 2020-09-13 DIAGNOSIS — F200 Paranoid schizophrenia: Secondary | ICD-10-CM | POA: Diagnosis present

## 2020-09-13 DIAGNOSIS — Z046 Encounter for general psychiatric examination, requested by authority: Secondary | ICD-10-CM | POA: Diagnosis not present

## 2020-09-13 HISTORY — DX: Anxiety disorder, unspecified: F41.9

## 2020-09-13 LAB — RESP PANEL BY RT-PCR (RSV, FLU A&B, COVID)  RVPGX2
Influenza A by PCR: NEGATIVE
Influenza B by PCR: NEGATIVE
Resp Syncytial Virus by PCR: NEGATIVE
SARS Coronavirus 2 by RT PCR: NEGATIVE

## 2020-09-13 MED ORDER — RISPERIDONE 3 MG PO TABS
3.0000 mg | ORAL_TABLET | Freq: Every day | ORAL | Status: DC
Start: 2020-09-13 — End: 2020-09-14
  Administered 2020-09-13: 21:00:00 3 mg via ORAL
  Filled 2020-09-13 (×3): qty 1

## 2020-09-13 MED ORDER — BENZTROPINE MESYLATE 0.5 MG PO TABS: 0.5000 mg | ORAL_TABLET | Freq: Two times a day (BID) | ORAL | Status: DC | PRN

## 2020-09-13 MED ORDER — ALUM & MAG HYDROXIDE-SIMETH 200-200-20 MG/5ML PO SUSP: 30.0000 mL | ORAL | Status: DC | PRN

## 2020-09-13 MED ORDER — ACETAMINOPHEN 325 MG PO TABS: 650.0000 mg | ORAL_TABLET | Freq: Four times a day (QID) | ORAL | Status: DC | PRN

## 2020-09-13 MED ORDER — STERILE WATER FOR INJECTION IJ SOLN
INTRAMUSCULAR | Status: AC
  Administered 2020-09-13: 13:00:00 2.1 mL
  Filled 2020-09-13: qty 10

## 2020-09-13 MED ORDER — LORAZEPAM 1 MG PO TABS: 1.0000 mg | ORAL_TABLET | ORAL | Status: DC | PRN

## 2020-09-13 MED ORDER — ZIPRASIDONE MESYLATE 20 MG IM SOLR: 20.0000 mg | Freq: Two times a day (BID) | INTRAMUSCULAR | Status: DC | PRN

## 2020-09-13 MED ORDER — STERILE WATER FOR INJECTION IJ SOLN
INTRAMUSCULAR | Status: AC
  Filled 2020-09-13: qty 10

## 2020-09-13 MED ORDER — TRAZODONE HCL 150 MG PO TABS
150.0000 mg | ORAL_TABLET | Freq: Every day | ORAL | Status: DC
Start: 2020-09-13 — End: 2020-09-18
  Administered 2020-09-13: 21:00:00 150 mg via ORAL
  Filled 2020-09-13 (×8): qty 1

## 2020-09-13 MED ORDER — MAGNESIUM HYDROXIDE 400 MG/5ML PO SUSP: 30.0000 mL | Freq: Every day | ORAL | Status: DC | PRN

## 2020-09-13 MED ORDER — CLONIDINE HCL 0.2 MG PO TABS
0.2000 mg | ORAL_TABLET | Freq: Two times a day (BID) | ORAL | Status: DC
  Administered 2020-09-14: 08:00:00 0.2 mg via ORAL
  Filled 2020-09-13 (×5): qty 1

## 2020-09-13 NOTE — Progress Notes (Signed)
09/13/2020  1247  Notified Dr Dalene Seltzer of patients BP 123/109 and HR 126. Waiting for orders. Will continue to monitor.

## 2020-09-13 NOTE — Consult Note (Signed)
[facility]  09/13/2020 1:23 PM Lindsey FileMaiya [place]  MRN:  [id number] Subjective: Patient visualized staring at ceiling.  Attempted several times to engage patient verbally, attending RN also attempted to assist with engaging patient.    Patient does not respond to verbal prompts nor does she look toward Clinical research associatewriter.  Patient does not appear to respond at all to staff member or to this Clinical research associatewriter via telepsychiatry. Per staff member patient has not been eating or drinking.  Patient does not participate in assessment.  Attempted to discuss treatment plan, patient continues to stare at the ceiling.   According to medical record patient appears to be refusing medications.  Plan to initiate long-acting injectable antipsychotic medication today.  Principal Problem: Paranoid schizophrenia (HCC) Diagnosis:  Principal Problem:   Paranoid schizophrenia (HCC)  Total Time spent with patient: 15 minutes  Past Psychiatric History: Paranoid schizophrenia, schizoaffective schizophrenia, schizoaffective disorder, bipolar type  Past Medical History:  Past Medical History:  Diagnosis Date  . Depression   . Oppositional defiant behavior   . Schizo affective schizophrenia (HCC)   . Schizophrenia (HCC)    History reviewed. No pertinent surgical history. Family History:  Family History  Problem Relation Age of Onset  . Heart failure Mother   . Hypertension Mother   . Diabetes Father    Family Psychiatric  History: None reported Social History:  Social History   Substance and Sexual Activity  Alcohol Use No     Social History   Substance and Sexual Activity  Drug Use No    Social History   Socioeconomic History  . Marital status: Single    Spouse name: Not on [place]  . Number of children: Not on [place]  . Years of education: Not on [place]  . Highest education level: Not on [place]  Occupational History  . Occupation: Unemployed  Tobacco Use  . Smoking status: Never Smoker  . Smokeless  tobacco: Never Used  Vaping Use  . Vaping Use: Never used  Substance and Sexual Activity  . Alcohol use: No  . Drug use: No  . Sexual activity: Never    Birth control/protection: None  Other Topics Concern  . Not on [place]  Social History Narrative   ** Merged History Encounter **       Pt lives with her mother, brother, and father.  [facility]   Social Determinants of Health   Financial Resource Strain: Not on [place]  Food Insecurity: Not on [place]  Transportation Needs: Not on [place]  Physical Activity: Not on [place]  Stress: Not on [place]  Social Connections: Not on [place]    Sleep: Fair  Appetite:  Poor  Current Medications: Current Facility-Administered Medications  Medication Dose Route Frequency Provider Last Rate Last Admin  . acetaminophen (TYLENOL) tablet 650 mg  650 mg Oral Q4H PRN Jacalyn LefevreHaviland, Julie, MD      . alum & mag hydroxide-simeth (MAALOX/MYLANTA) 200-200-20 MG/5ML suspension 30 mL  30 mL Oral Q6H PRN Jacalyn LefevreHaviland, Julie, MD      . benztropine (COGENTIN) tablet 0.5 mg  0.5 mg Oral BID PRN Jacalyn LefevreHaviland, Julie, MD      . cloNIDine (CATAPRES) tablet 0.2 mg  0.2 mg Oral BID Jacalyn LefevreHaviland, Julie, MD   0.2 mg at 09/13/20 0947  . ziprasidone (GEODON) injection 20 mg  20 mg Intramuscular Q12H PRN Patrcia Dollyate, Breindy Meadow L, FNP   20 mg at 09/13/20 0130   And  . LORazepam (ATIVAN) tablet 1 mg  1 mg Oral PRN Patrcia Dolly, FNP      . ondansetron [facility]) tablet 4 mg  4 mg Oral Q8H PRN Jacalyn Lefevre, MD      . paliperidone (INVEGA SUSTENNA) injection 156 mg  156 mg Intramuscular Once Ophelia Shoulder E, NP      . risperiDONE (RISPERDAL) tablet 3 mg  3 mg Oral QHS Ophelia Shoulder E, NP      . sterile water (preservative free) injection           . sterile water (preservative free) injection           . traZODone (DESYREL) tablet 150 mg  150 mg Oral QHS Jacalyn Lefevre, MD   150 mg at 09/10/20 2136   Current Outpatient Medications  Medication Sig Dispense Refill   . benztropine (COGENTIN) 0.5 MG tablet Take 1 tablet (0.5 mg total) by mouth 2 (two) times daily as needed for tremors (EPS). 60 tablet 2  . cloNIDine (CATAPRES) 0.2 MG tablet Take 1 tablet (0.2 mg total) by mouth 2 (two) times daily. 60 tablet 2  . paliperidone (INVEGA SUSTENNA) 156 MG/ML SUSY injection Inject 1 mL (156 mg total) into the muscle every 30 (thirty) days. 1.2 mL 11  . risperiDONE (RISPERDAL) 3 MG tablet Take 2 tablets (6 mg total) by mouth at bedtime. 60 tablet 3  . traZODone (DESYREL) 150 MG tablet Take 150 mg by mouth at bedtime.      Lab Results:  Results for orders placed or performed during the hospital encounter of 09/09/20 (from the past 48 hour(s))  Basic metabolic panel     Status: Abnormal   Collection Time: 09/12/20  3:26 PM  Result Value Ref Range   Sodium 138 135 - 145 mmol/L   Potassium 3.7 3.5 - 5.1 mmol/L   Chloride 102 98 - 111 mmol/L   CO2 23 22 - 32 mmol/L   Glucose, Bld 108 (H) 70 - 99 mg/dL    Comment: Glucose reference range applies only to samples taken after fasting for at least 8 hours.   BUN 15 6 - 20 mg/dL   Creatinine, Ser 8.93 (H) 0.44 - 1.00 mg/dL   Calcium 9.3 8.9 - 81.0 mg/dL   GFR, Estimated >17 >51 mL/min    Comment: (NOTE) Calculated using the CKD-EPI Creatinine Equation (2021)    Anion gap 13 5 - 15    Comment: Performed at [facility], [address] [place], [place] [address]  CBC with Differential     Status: None   Collection Time: 09/12/20  3:26 PM  Result Value Ref Range   WBC 7.4 4.0 - 10.5 K/uL   RBC 4.14 3.87 - 5.11 MIL/uL   Hemoglobin 12.6 12.0 - 15.0 g/dL   HCT 27.7 82.4 - 23.5 %   MCV 95.4 80.0 - 100.0 fL   MCH 30.4 26.0 - 34.0 pg   MCHC 31.9 30.0 - 36.0 g/dL   RDW 36.1 44.3 - 15.4 %   Platelets 279 150 - 400 K/uL   nRBC 0.0 0.0 - 0.2 %   Neutrophils Relative % 75 %   Neutro Abs 5.5 1.7 - 7.7 K/uL   Lymphocytes Relative 17 %   Lymphs Abs 1.3 0.7 - 4.0 K/uL   Monocytes Relative 7 %    Monocytes Absolute 0.5 0.1 - 1.0 K/uL   Eosinophils Relative 1 %   Eosinophils Absolute 0.1 0.0 - 0.5 K/uL   Basophils Relative 0 %   Basophils  Absolute 0.0 0.0 - 0.1 K/uL   Immature Granulocytes 0 %   Abs Immature Granulocytes 0.02 0.00 - 0.07 K/uL    Comment: Performed at [facility], [address], [place], [place] [address]  CK     Status: Abnormal   Collection Time: 09/12/20  3:26 PM  Result Value Ref Range   Total CK 891 (H) 38 - 234 U/L    Comment: Performed at [facility], [address] [place], [place] [address]  Resp panel by RT-PCR (RSV, Flu A&B, Covid) Nasopharyngeal Swab     Status: None   Collection Time: 09/13/20  7:42 AM   Specimen: Nasopharyngeal Swab; Nasopharyngeal(NP) swabs in vial transport medium  Result Value Ref Range   SARS Coronavirus 2 by RT PCR NEGATIVE NEGATIVE    Comment: (NOTE) SARS-CoV-2 target nucleic acids are NOT DETECTED.  The SARS-CoV-2 RNA is generally detectable in upper respiratory specimens during the acute phase of infection. The lowest concentration of SARS-CoV-2 viral copies this assay can detect is 138 copies/mL. A negative result does not preclude SARS-Cov-2 infection and should not be used as the sole basis for treatment or other patient management decisions. A negative result may occur with  improper specimen collection/handling, submission of specimen other than nasopharyngeal swab, presence of viral mutation(s) within the areas targeted by this assay, and inadequate number of viral copies(<138 copies/mL). A negative result must be combined with clinical observations, patient history, and epidemiological information. The expected result is Negative.  Fact Sheet for Patients:  BloggerCourse.com  Fact Sheet for Healthcare Providers:  SeriousBroker.it  This test is no t yet approved or cleared by the [place] FDA and  has been  authorized for detection and/or diagnosis of SARS-CoV-2 by FDA under an Emergency Use Authorization (EUA). This EUA will remain  in effect (meaning this test can be used) for the duration of the COVID-19 declaration under Section 564(b)(1) of the Act, 21 U.S.C.section 360bbb-3(b)(1), unless the authorization is terminated  or revoked sooner.       Influenza A by PCR NEGATIVE NEGATIVE   Influenza B by PCR NEGATIVE NEGATIVE    Comment: (NOTE) The Xpert Xpress SARS-CoV-2/FLU/RSV plus assay is intended as an aid in the diagnosis of influenza from Nasopharyngeal swab specimens and should not be used as a sole basis for treatment. Nasal washings and aspirates are unacceptable for Xpert Xpress SARS-CoV-2/FLU/RSV testing.  Fact Sheet for Patients: BloggerCourse.com  Fact Sheet for Healthcare Providers: SeriousBroker.it  This test is not yet approved or cleared by the [place] FDA and has been authorized for detection and/or diagnosis of SARS-CoV-2 by FDA under an Emergency Use Authorization (EUA). This EUA will remain in effect (meaning this test can be used) for the duration of the COVID-19 declaration under Section 564(b)(1) of the Act, 21 U.S.C. section 360bbb-3(b)(1), unless the authorization is terminated or revoked.     Resp Syncytial Virus by PCR NEGATIVE NEGATIVE    Comment: (NOTE) Fact Sheet for Patients: BloggerCourse.com  Fact Sheet for Healthcare Providers: SeriousBroker.it  This test is not yet approved or cleared by the [place] FDA and has been authorized for detection and/or diagnosis of SARS-CoV-2 by FDA under an Emergency Use Authorization (EUA). This EUA will remain in effect (meaning this test can be used) for the duration of the COVID-19 declaration under Section 564(b)(1) of the Act, 21 U.S.C. section 360bbb-3(b)(1), unless the authorization is  terminated or revoked.  Performed at [facility], [address] [place], [place] [address]     Blood Alcohol level:  Lab Results  Component Value Date   ETH <10 09/09/2020   ETH <10 01/04/2020    Physical Findings: AIMS:  , ,  ,  ,    CIWA:    COWS:     Musculoskeletal: Strength & Muscle Tone: decreased Gait & Station: Unable to assess Patient leans: N/A  Psychiatric Specialty Exam: Physical Exam Vitals and nursing note reviewed.  Constitutional:      Appearance: She is well-developed.  HENT:     Head: Normocephalic.  Cardiovascular:     Rate and Rhythm: Normal rate.  Pulmonary:     Effort: Pulmonary effort is normal.  Neurological:     Mental Status: She is alert and oriented to person, place, and time.  Psychiatric:        Attention and Perception: She is inattentive.        Mood and Affect: Affect is blunt and flat.        Speech: She is noncommunicative.        Behavior: Behavior is uncooperative and withdrawn.     Review of Systems  Constitutional: Negative.   HENT: Negative.   Eyes: Negative.   Respiratory: Negative.   Cardiovascular: Negative.   Gastrointestinal: Negative.   Genitourinary: Negative.   Musculoskeletal: Negative.   Skin: Negative.   Neurological: Negative.   Psychiatric/Behavioral: Positive for dysphoric mood.    Blood pressure (!) 123/109, pulse (!) 126, temperature 98.7 F (37.1 C), temperature source Oral, resp. rate 20, SpO2 100 %.There is no height or weight on [place] to calculate BMI.  General Appearance: Disheveled  Eye Contact:  None  Speech:  NA  Volume:  not speaking  Mood:  NA  Affect:  Restricted  Thought Process:  NA  Orientation:  Other:  unable to assess  Thought Content:  NA  Suicidal Thoughts:  unable to assess  Homicidal Thoughts:  unable to assess  Memory:  NA  Judgement:  Impaired  Insight:  NA  Psychomotor Activity:  Decreased  Concentration:  Concentration: NA  Recall:  NA   Fund of Knowledge:  NA  Language:  NA  Akathisia:  No  Handed:  Right  AIMS (if indicated):     Assets:  Communication Skills Desire for Improvement Financial Resources/Insurance Housing Intimacy Leisure Time Physical Health Resilience Social Support  ADL's:  Impaired  Cognition:    Sleep:         Treatment Plan Summary: Patient reviewed with Dr. Bronwen Betters. Daily contact with patient to assess and evaluate symptoms and progress in treatment   Patient is currently in emergency department under involuntary commitment petition, will benefit from medication administration.  Inpatient psychiatric treatment recommended.   Patrcia Dolly, FNP 09/13/2020, 1:23 PM

## 2020-09-13 NOTE — Tx Team (Signed)
Initial Treatment Plan 09/13/2020 7:39 PM [name] YBO:[id number]    PATIENT STRESSORS: Financial difficulties Marital or family conflict Medication change or noncompliance   PATIENT STRENGTHS: Motivation for treatment/growth Physical Health Supportive family/friends   PATIENT IDENTIFIED PROBLEMS: "anxiety"  "depression"                   DISCHARGE CRITERIA:  Ability to meet basic life and health needs Adequate post-discharge living arrangements Improved stabilization in mood, thinking, and/or behavior Medical problems require only outpatient monitoring Motivation to continue treatment in a less acute level of care Need for constant or close observation no longer present Reduction of life-threatening or endangering symptoms to within safe limits Safe-care adequate arrangements made Verbal commitment to aftercare and medication compliance  PRELIMINARY DISCHARGE PLAN: Attend aftercare/continuing care group Attend PHP/IOP Outpatient therapy Return to previous living arrangement  PATIENT/FAMILY INVOLVEMENT: This treatment plan has been presented to and reviewed with the patient, Lindsey Richmond..  The patient and family have been given the opportunity to ask questions and make suggestions.  Earline Mayotte, [place] 09/13/2020, 7:39 PM

## 2020-09-13 NOTE — ED Notes (Signed)
Lindsey Richmond on several occassions had to  Be prevented from diving over her bed rails to the floor . Lindsey Richmond APPEARED TO BE RESPONDING TO VISUAL HALLUCINATION as if she was seeing something on the ceiling of her room  She would not relay to staff what she was seeing but several times attempted to jump over her bed rails

## 2020-09-13 NOTE — Progress Notes (Signed)
09/13/2020  Patient Father called planning to come visit. Pt refused to speak with father and states she does not want to see him either.

## 2020-09-13 NOTE — Progress Notes (Signed)
09/13/2020  1638  Called [facility] [phone] to give report. Report given to [facility].

## 2020-09-13 NOTE — ED Notes (Signed)
Pt need shower and personal hygiene really bad but refused

## 2020-09-13 NOTE — BH Assessment (Addendum)
[facility] Assessment Progress Note  Per Berneice Heinrich, NP, this pt requires psychiatric hospitalization.  Percell Boston, RN, Nursing Director has assigned pt to [facility] 504-1; [facility] will be ready to receive pt after 17:00.  Pt presents under IVC initiated by law enforcement, and upheld by EDP Jacalyn Lefevre, MD, and IVC documents have been faxed to [facility].  EDP Alvira Monday, MD and pt's nurse, Addison Naegeli, have been notified, and Addison Naegeli agrees to call report to [phone].  Pt is to be transported via [employer].   Doylene Canning, [place] Behavioral Health Coordinator [phone]

## 2020-09-13 NOTE — Progress Notes (Signed)
Pt visible on the unit, pt remembers staff from previous admissions    09/13/20 2200  Psych Admission Type (Psych Patients Only)  Admission Status Involuntary  Psychosocial Assessment  Patient Complaints Depression  Eye Contact Brief  Facial Expression Sad;Anxious  Affect Anxious;Apprehensive;Depressed;Sad  Speech Soft;Slow  Interaction Poor;Forwards little  Motor Activity Slow  Appearance/Hygiene Body odor;Disheveled;In scrubs  Behavior Characteristics Cooperative  Mood Depressed  Thought Process  Coherency WDL  Content Blaming others  Delusions None reported or observed  Perception WDL  Hallucination None reported or observed  Judgment Impaired  Confusion None  Danger to Self  Current suicidal ideation? Denies  Danger to Others  Danger to Others None reported or observed  Danger to Others Abnormal  Harmful Behavior to others No threats or harm toward other people  Destructive Behavior No threats or harm toward property

## 2020-09-13 NOTE — ED Notes (Signed)
Pt refusing all care.

## 2020-09-13 NOTE — Progress Notes (Signed)
Adult Psychoeducational Group Note  Date:  09/13/2020 Time:  10:31 PM  Group Topic/Focus:  Wrap-Up Group:   The focus of this group is to help patients review their daily goal of treatment and discuss progress on daily workbooks.  Participation Level:  Minimal  Participation Quality:  Appropriate  Affect:  Appropriate  Cognitive:  Appropriate  Insight: Limited  Engagement in Group:  Limited  Modes of Intervention:  Discussion  Additional Comments:  Pt stated her goal for today was to focus on her treatment plan. Pt stated she felt she accomplished her goal today. Pt is a new admission. Pt rated her overall day a 7 out of 10. Pt stated her appetite was fair today and she attended all meals. Pt stated her sleep last night was poor. Pt stated the goal for tonight was to get some rest. Pt nurse was made aware of situation. Pt stated she was in no physical pain. Pt deny auditory or visual hallucinations. Pt denies thoughts of harming herself or others. Pt stated she would alert staff if anything changes  Lindsey Richmond 09/13/2020, 10:31 PM

## 2020-09-13 NOTE — ED Notes (Signed)
Pt refused shower and bed bath this sitter ask pt x3 she refused and say leave me alone.

## 2020-09-13 NOTE — Progress Notes (Signed)
09/13/2020  1645 Called police [phone] to transport patient to [facility].

## 2020-09-13 NOTE — Progress Notes (Signed)
Patient is 24 yrs old, involuntary, has been patient at [facility] several times.  Patient stated her mother and brother called the ambulance to take her to the hospital.  Patient stated she is "very religious".  Patient did not understand what happened for her to be brought to the hospital.  Patient stated she has never used any drugs, alcohol or tobacco.  That she lives with her mother and brother. Fall risk information given to patient, low fall risk. Patient was taken to 500 hall, given food/drink, oriented to hallway. Patient has been pleasant and cooperative.

## 2020-09-13 NOTE — Plan of Care (Signed)
Nurse discussed anxiety with patient.  

## 2020-09-14 DIAGNOSIS — F200 Paranoid schizophrenia: Principal | ICD-10-CM

## 2020-09-14 MED ORDER — PALIPERIDONE ER 6 MG PO TB24
6.0000 mg | ORAL_TABLET | Freq: Every day | ORAL | Status: DC
  Filled 2020-09-14 (×2): qty 1

## 2020-09-14 MED ORDER — RISPERIDONE 1 MG PO TBDP
1.0000 mg | ORAL_TABLET | Freq: Every day | ORAL | Status: DC
  Filled 2020-09-14 (×2): qty 1

## 2020-09-14 MED ORDER — RISPERIDONE 3 MG PO TBDP
3.0000 mg | ORAL_TABLET | Freq: Every day | ORAL | Status: DC
  Filled 2020-09-14: qty 1

## 2020-09-14 MED ORDER — PALIPERIDONE PALMITATE ER 234 MG/1.5ML IM SUSY: 234.0000 mg | PREFILLED_SYRINGE | Freq: Once | INTRAMUSCULAR | Status: DC

## 2020-09-14 MED ORDER — PALIPERIDONE ER 3 MG PO TB24
3.0000 mg | ORAL_TABLET | ORAL | Status: AC
  Filled 2020-09-14 (×2): qty 1

## 2020-09-14 NOTE — Progress Notes (Signed)
Pt having a hard time sleeping in the room with a roommate. Pt out the room pacing the hall during the night. Previous 6 admissions pt has been a no roommate due to paranoia, Hx of aggression, hygiene, bizarre behavior- yelling in the middle of the night, un able to tolerate roommate.  Pt eventually woke up the roommate due to her paranoid behavior.

## 2020-09-14 NOTE — [facility] Suicide Risk Assessment (Signed)
[facility] INPATIENT:  Family/Significant Other Suicide Prevention Education   Refusal of Consents:  Suicide Prevention Education:  Patient Refusal for Family/Significant Other Suicide Prevention Education: The patient Lindsey Richmond has refused to provide written consent for family/significant other to be provided Family/Significant Other Suicide Prevention Education during admission and/or prior to discharge.  Physician notified.   SPE completed with patient, as patient refused to consent to family contact. SPI pamphlet provided to pt and pt was encouraged to share information with support network, ask questions, and talk about any concerns relating to SPE. Patient denies access to guns/firearms and verbalized understanding of information provided. Mobile Crisis information also provided to patient.   Ruthann Cancer MSW, LCSW Clincal Social Worker  [facility]
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
## 2020-09-14 NOTE — [facility] Suicide Risk Assessment (Signed)
[facility] Admission Suicide Risk Assessment   Nursing information obtained from:  Patient Demographic factors:  Low socioeconomic status,Adolescent or young adult,Unemployed Current Mental Status:  NA Loss Factors:  Financial problems / change in socioeconomic status Historical Factors:  Family history of mental illness or substance abuse Risk Reduction Factors:  Religious beliefs about death,Living with another person, especially a relative  Total Time spent with patient: 45 minutes Principal Problem: <principal problem not specified> Diagnosis:  Active Problems:   Paranoid schizophrenia (HCC)  Subjective Data: Patient is seen and examined.  Patient is a 24 year old female with a past psychiatric history significant for schizophrenia who originally presented to the [facility] emergency department on 09/09/2020.  She was brought there by EMS.  The patient had apparently been off her psychiatric medications for an unknown specified amount of time.  She was essentially nonverbal.  She apparently was very agitated in the emergency department, and attempted to leave the emergency department.  She was placed under involuntary commitment by the police.  She received intramuscular Ativan as well as Geodon secondary to her agitation.  She was noted to be sedated on initial evaluation.  Collateral information in the emergency department noted that the patient had stood in her garage staring at a wall for an hour.  The patient apparently did not respond to her mother's request.  The patient had poor hygiene, and her mother had to place shoes on her feet.  The mother believes she had not been taking her oral medications consistently.  She apparently goes to [facility] for outpatient treatment, and it is thought that she had received her paliperidone long-acting injection a month prior to evaluation.  Upon further questioning by the consultation staff the mother believe that it may have been September  when she last received her paliperidone injection.  The consultative service placed her on Cogentin Risperdal and trazodone.  She was reconsulted on 09/11/2020.  She was more verbal and there was some improvement.  A follow-up consultation was done on 09/12/2020.  She still did not respond in an appropriate manner and appeared somewhat catatonic.  She apparently received the paliperidone injection 156 mg IM x1 on 12/12.  Despite this she appeared to be psychotic, and the decision was made to admit her to the hospital for evaluation and stabilization.  Other complaints include not eating, not drinking, and poor hygiene.  On examination today she did admit to auditory hallucinations.  She was somewhat redirectable.  Continued Clinical Symptoms:  Alcohol Use Disorder Identification Test Final Score (AUDIT): 0 The "Alcohol Use Disorders Identification Test", Guidelines for Use in Primary Care, Second Edition.  World Science writer [facility]). Score between 0-7:  no or low risk or alcohol related problems. Score between 8-15:  moderate risk of alcohol related problems. Score between 16-19:  high risk of alcohol related problems. Score 20 or above:  warrants further diagnostic evaluation for alcohol dependence and treatment.   CLINICAL FACTORS:   Schizophrenia:   Less than 26 years old Paranoid or undifferentiated type   Musculoskeletal: Strength & Muscle Tone: within normal limits Gait & Station: normal Patient leans: N/A  Psychiatric Specialty Exam: Physical Exam Vitals and nursing note reviewed.  Constitutional:      Appearance: She is obese.  HENT:     Head: Normocephalic and atraumatic.  Pulmonary:     Effort: Pulmonary effort is normal.  Neurological:     General: No focal deficit present.     Mental Status: She is  alert and oriented to person, place, and time.     Review of Systems  Blood pressure 91/60, pulse (!) 111, temperature 97.8 F (36.6 C), temperature source Oral, resp.  rate 16, height 5\' 4"  (1.626 m), weight 121.6 kg, last menstrual period 09/13/2020, SpO2 99 %.Body mass index is 46 kg/m.  General Appearance: Disheveled  Eye Contact:  Minimal  Speech:  Normal Rate  Volume:  Decreased  Mood:  Dysphoric  Affect:  Flat  Thought Process:  Disorganized and Descriptions of Associations: Loose  Orientation:  Full (Time, Place, and Person)  Thought Content:  Delusions, Hallucinations: Auditory and Paranoid Ideation  Suicidal Thoughts:  No  Homicidal Thoughts:  No  Memory:  Immediate;   Poor Recent;   Poor Remote;   Poor  Judgement:  Impaired  Insight:  Lacking  Psychomotor Activity:  Decreased  Concentration:  Concentration: Fair and Attention Span: Fair  Recall:  Poor  Fund of Knowledge:  Fair  Language:  Fair  Akathisia:  Negative  Handed:  Right  AIMS (if indicated):     Assets:  Desire for Improvement Housing Resilience  ADL's:  Impaired  Cognition:  WNL  Sleep:  Number of Hours: 4.5      COGNITIVE FEATURES THAT CONTRIBUTE TO RISK:  Thought constriction (tunnel vision)    SUICIDE RISK:   Mild:  Suicidal ideation of limited frequency, intensity, duration, and specificity.  There are no identifiable plans, no associated intent, mild dysphoria and related symptoms, good self-control (both objective and subjective assessment), few other risk factors, and identifiable protective factors, including available and accessible social support.  PLAN OF CARE: Patient is seen and examined.  Patient is a 24 year old female with the above-stated past psychiatric history who was admitted to the hospital secondary to noncompliance of medications as well as psychotic symptoms.  She will be admitted to the hospital.  She will be integrated in the milieu.  She will be encouraged to attend groups.  She apparently received paliperidone 156 mg on 12/12.  I suspect she probably should have received the 234 mg dosage.  We will place her on oral paliperidone including  3 mg right now and 6 mg p.o. nightly starting tonight.  We will monitor to see how she does.  If her hygiene, appetite and other symptoms do not improve I may go on to give her another 156 mg dosage.  On her last psychiatric hospitalization in April 2021 she was discharged on Cogentin, clonidine, the 156 mg dose of paliperidone every 30 days, Risperdal 6 mg p.o. nightly as well as trazodone.  We will restart all of her medicines except for the oral Risperdal, and change that to oral paliperidone for consistency.  This is her fourth admission this year.  Clearly a long-acting injectable will benefit her.  Review of her admission laboratories revealed essentially normal electrolytes including a creatinine at 1.05 and normal liver function enzymes.  She had a CK done which was 891 on 12/12, but that may have been elevated secondary to the forced medication injections.  Her CBC was completely negative.  Beta-hCG was less than 1.  Urinalysis had a moderate amount of leukocytes.  She had 100 mg per DL of protein.  There were no bacteria seen, but 6-10 white blood cells.  Drug screen was negative.  Blood alcohol was less than 10.  EKG showed a sinus tachycardia with a normal QTc interval.  Her blood pressure was 129/78, and repeat was 91/60.  Pulse was 10  3-111.  She is afebrile.  I certify that inpatient services furnished can reasonably be expected to improve the patient's condition.   Antonieta Pert, MD 09/14/2020, 9:26 AM

## 2020-09-14 NOTE — Plan of Care (Signed)
  Problem: Education: Goal: Ability to state activities that reduce stress will improve Outcome: Progressing   Problem: Coping: Goal: Ability to identify and develop effective coping behavior will improve Outcome: Progressing   Problem: Education: Goal: Utilization of techniques to improve thought processes will improve Outcome: Progressing

## 2020-09-14 NOTE — Progress Notes (Signed)
Recreation Therapy Notes  12.14.21:  LRT went to patient room to complete recreation therapy assessment.  Patient was on her knees looking out the window and would not respond when LRT knocked on the door or called her name.  LRT will attempt to complete assessment with patient at a later time.     Caroll Rancher, LRT/CTRS   Caroll Rancher A 09/14/2020 1:59 PM

## 2020-09-14 NOTE — H&P (Signed)
Psychiatric Admission Assessment Adult  Patient Identification: Lindsey Richmond MRN:  [id number] Date of Evaluation:  09/14/2020 Chief Complaint:  Paranoid schizophrenia (HCC) [F20.0] Principal Diagnosis: <principal problem not specified> Diagnosis:  Active Problems:   Paranoid schizophrenia (HCC)  History of Present Illness: Patient is seen and examined.  Patient is a 24 year old female with a past psychiatric history significant for schizophrenia who originally presented to the [facility] emergency department on 09/09/2020.  She was brought there by EMS.  The patient had apparently been off her psychiatric medications for an unknown specified amount of time.  She was essentially nonverbal.  She apparently was very agitated in the emergency department, and attempted to leave the emergency department.  She was placed under involuntary commitment by the police.  She received intramuscular Ativan as well as Geodon secondary to her agitation.  She was noted to be sedated on initial evaluation.  Collateral information in the emergency department noted that the patient had stood in her garage staring at a wall for an hour.  The patient apparently did not respond to her mother's request.  The patient had poor hygiene, and her mother had to place shoes on her feet.  The mother believes she had not been taking her oral medications consistently.  She apparently goes to [facility] for outpatient treatment, and it is thought that she had received her paliperidone long-acting injection a month prior to evaluation.  Upon further questioning by the consultation staff the mother believe that it may have been September when she last received her paliperidone injection.  The consultative service placed her on Cogentin Risperdal and trazodone.  She was reconsulted on 09/11/2020.  She was more verbal and there was some improvement.  A follow-up consultation was done on 09/12/2020.  She still did not respond in an  appropriate manner and appeared somewhat catatonic.  She apparently received the paliperidone injection 156 mg IM x1 on 12/12.  Despite this she appeared to be psychotic, and the decision was made to admit her to the hospital for evaluation and stabilization.  Other complaints include not eating, not drinking, and poor hygiene.  On examination today she did admit to auditory hallucinations.  She was somewhat redirectable.  Associated Signs/Symptoms: Depression Symptoms:  anhedonia, insomnia, psychomotor agitation, anxiety, loss of energy/fatigue, disturbed sleep, Duration of Depression Symptoms: Greater than two weeks  (Hypo) Manic Symptoms:  Delusions, Distractibility, Hallucinations, Impulsivity, Irritable Mood, Labiality of Mood, Anxiety Symptoms:  Excessive Worry, Psychotic Symptoms:  Delusions, Hallucinations: Auditory Paranoia, Duration of Psychotic Symptoms: Less than six months  PTSD Symptoms: Negative Total Time spent with patient: 45 minutes  Past Psychiatric History: Patient has been previously diagnosed with schizophrenia.  She has had multiple psychiatric hospitalizations.  Her last admission to our facility was in April of this year.  She has been previously discharged on paliperidone long-acting injectable medications, Risperdal, Cogentin, Provigil and trazodone.  Is the patient at risk to self? Yes.    Has the patient been a risk to self in the past 6 months? Yes.    Has the patient been a risk to self within the distant past? Yes.    Is the patient a risk to others? No.  Has the patient been a risk to others in the past 6 months? No.  Has the patient been a risk to others within the distant past? No.   Prior Inpatient Therapy:   Prior Outpatient Therapy:    Alcohol Screening: 1. How often do you have a  drink containing alcohol?: Never 2. How many drinks containing alcohol do you have on a typical day when you are drinking?: 1 or 2 3. How often do you have six  or more drinks on one occasion?: Never AUDIT-C Score: 0 4. How often during the last year have you found that you were not able to stop drinking once you had started?: Never 5. How often during the last year have you failed to do what was normally expected from you because of drinking?: Never 6. How often during the last year have you needed a first drink in the morning to get yourself going after a heavy drinking session?: Never 7. How often during the last year have you had a feeling of guilt of remorse after drinking?: Never 8. How often during the last year have you been unable to remember what happened the night before because you had been drinking?: Never 9. Have you or someone else been injured as a result of your drinking?: No 10. Has a relative or friend or a doctor or another health worker been concerned about your drinking or suggested you cut down?: No Alcohol Use Disorder Identification Test Final Score (AUDIT): 0 Alcohol Brief Interventions/Follow-up: AUDIT Score <7 follow-up not indicated Substance Abuse History in the last 12 months:  No. Consequences of Substance Abuse: Negative Previous Psychotropic Medications: Yes  Psychological Evaluations: Yes  Past Medical History:  Past Medical History:  Diagnosis Date  . Anxiety   . Depression   . Oppositional defiant behavior   . Schizo affective schizophrenia (HCC)   . Schizophrenia (HCC)    History reviewed. No pertinent surgical history. Family History:  Family History  Problem Relation Age of Onset  . Heart failure Mother   . Hypertension Mother   . Diabetes Father    Family Psychiatric  History: Reportedly negative Tobacco Screening: Have you used any form of tobacco in the last 30 days? (Cigarettes, Smokeless Tobacco, Cigars, and/or Pipes): No Social History:  Social History   Substance and Sexual Activity  Alcohol Use No     Social History   Substance and Sexual Activity  Drug Use No    Additional Social  History:      Pain Medications: see MAR Prescriptions: see MAR Over the Counter: see MAR History of alcohol / drug use?: No history of alcohol / drug abuse Longest period of sobriety (when/how long): none Negative Consequences of Use: Financial Withdrawal Symptoms: Other (Comment) (none)                    Allergies:  No Known Allergies Lab Results:  Results for orders placed or performed during the hospital encounter of 09/09/20 (from the past 48 hour(s))  Basic metabolic panel     Status: Abnormal   Collection Time: 09/12/20  3:26 PM  Result Value Ref Range   Sodium 138 135 - 145 mmol/L   Potassium 3.7 3.5 - 5.1 mmol/L   Chloride 102 98 - 111 mmol/L   CO2 23 22 - 32 mmol/L   Glucose, Bld 108 (H) 70 - 99 mg/dL    Comment: Glucose reference range applies only to samples taken after fasting for at least 8 hours.   BUN 15 6 - 20 mg/dL   Creatinine, Ser 8.29 (H) 0.44 - 1.00 mg/dL   Calcium 9.3 8.9 - 56.2 mg/dL   GFR, Estimated >13 >08 mL/min    Comment: (NOTE) Calculated using the CKD-EPI Creatinine Equation (2021)    Anion gap  13 5 - 15    Comment: Performed at [facility], [address], [place], [place] [address]  CBC with Differential     Status: None   Collection Time: 09/12/20  3:26 PM  Result Value Ref Range   WBC 7.4 4.0 - 10.5 K/uL   RBC 4.14 3.87 - 5.11 MIL/uL   Hemoglobin 12.6 12.0 - 15.0 g/dL   HCT 79.8 92.1 - 19.4 %   MCV 95.4 80.0 - 100.0 fL   MCH 30.4 26.0 - 34.0 pg   MCHC 31.9 30.0 - 36.0 g/dL   RDW 17.4 08.1 - 44.8 %   Platelets 279 150 - 400 K/uL   nRBC 0.0 0.0 - 0.2 %   Neutrophils Relative % 75 %   Neutro Abs 5.5 1.7 - 7.7 K/uL   Lymphocytes Relative 17 %   Lymphs Abs 1.3 0.7 - 4.0 K/uL   Monocytes Relative 7 %   Monocytes Absolute 0.5 0.1 - 1.0 K/uL   Eosinophils Relative 1 %   Eosinophils Absolute 0.1 0.0 - 0.5 K/uL   Basophils Relative 0 %   Basophils Absolute 0.0 0.0 - 0.1 K/uL   Immature Granulocytes 0 %   Abs  Immature Granulocytes 0.02 0.00 - 0.07 K/uL    Comment: Performed at [facility], [address] [place], [place] [address]  CK     Status: Abnormal   Collection Time: 09/12/20  3:26 PM  Result Value Ref Range   Total CK 891 (H) 38 - 234 U/L    Comment: Performed at [facility], [address] [place], [place] [address]  Resp panel by RT-PCR (RSV, Flu A&B, Covid) Nasopharyngeal Swab     Status: None   Collection Time: 09/13/20  7:42 AM   Specimen: Nasopharyngeal Swab; Nasopharyngeal(NP) swabs in vial transport medium  Result Value Ref Range   SARS Coronavirus 2 by RT PCR NEGATIVE NEGATIVE    Comment: (NOTE) SARS-CoV-2 target nucleic acids are NOT DETECTED.  The SARS-CoV-2 RNA is generally detectable in upper respiratory specimens during the acute phase of infection. The lowest concentration of SARS-CoV-2 viral copies this assay can detect is 138 copies/mL. A negative result does not preclude SARS-Cov-2 infection and should not be used as the sole basis for treatment or other patient management decisions. A negative result may occur with  improper specimen collection/handling, submission of specimen other than nasopharyngeal swab, presence of viral mutation(s) within the areas targeted by this assay, and inadequate number of viral copies(<138 copies/mL). A negative result must be combined with clinical observations, patient history, and epidemiological information. The expected result is Negative.  Fact Sheet for Patients:  BloggerCourse.com  Fact Sheet for Healthcare Providers:  SeriousBroker.it  This test is no t yet approved or cleared by the [place] FDA and  has been authorized for detection and/or diagnosis of SARS-CoV-2 by FDA under an Emergency Use Authorization (EUA). This EUA will remain  in effect (meaning this test can be used) for the duration of the COVID-19 declaration  under Section 564(b)(1) of the Act, 21 U.S.C.section 360bbb-3(b)(1), unless the authorization is terminated  or revoked sooner.       Influenza A by PCR NEGATIVE NEGATIVE   Influenza B by PCR NEGATIVE NEGATIVE    Comment: (NOTE) The Xpert Xpress SARS-CoV-2/FLU/RSV plus assay is intended as an aid in the diagnosis of influenza from Nasopharyngeal swab specimens and should not be used as a sole basis for treatment. Nasal washings  and aspirates are unacceptable for Xpert Xpress SARS-CoV-2/FLU/RSV testing.  Fact Sheet for Patients: BloggerCourse.com  Fact Sheet for Healthcare Providers: SeriousBroker.it  This test is not yet approved or cleared by the [place] FDA and has been authorized for detection and/or diagnosis of SARS-CoV-2 by FDA under an Emergency Use Authorization (EUA). This EUA will remain in effect (meaning this test can be used) for the duration of the COVID-19 declaration under Section 564(b)(1) of the Act, 21 U.S.C. section 360bbb-3(b)(1), unless the authorization is terminated or revoked.     Resp Syncytial Virus by PCR NEGATIVE NEGATIVE    Comment: (NOTE) Fact Sheet for Patients: BloggerCourse.com  Fact Sheet for Healthcare Providers: SeriousBroker.it  This test is not yet approved or cleared by the [place] FDA and has been authorized for detection and/or diagnosis of SARS-CoV-2 by FDA under an Emergency Use Authorization (EUA). This EUA will remain in effect (meaning this test can be used) for the duration of the COVID-19 declaration under Section 564(b)(1) of the Act, 21 U.S.C. section 360bbb-3(b)(1), unless the authorization is terminated or revoked.  Performed at [facility], [address], [place], [place] [address]     Blood Alcohol level:  Lab Results  Component Value Date   [facility] <10 09/09/2020   ETH <10  01/04/2020    Metabolic Disorder Labs:  Lab Results  Component Value Date   HGBA1C 5.6 12/27/2019   MPG 114.02 12/27/2019   MPG 105.41 12/04/2019   Lab Results  Component Value Date   PROLACTIN 57.7 (H) 12/04/2019   PROLACTIN 75.7 (H) 10/23/2019   Lab Results  Component Value Date   CHOL 145 12/27/2019   TRIG 59 12/27/2019   HDL 24 (L) 12/27/2019   CHOLHDL 6.0 12/27/2019   VLDL 12 12/27/2019   LDLCALC 109 (H) 12/27/2019   LDLCALC 100 (H) 12/04/2019    Current Medications: Current Facility-Administered Medications  Medication Dose Route Frequency Provider Last Rate Last Admin  . acetaminophen (TYLENOL) tablet 650 mg  650 mg Oral Q6H PRN Antonieta Pert, MD      . alum & mag hydroxide-simeth (MAALOX/MYLANTA) 200-200-20 MG/5ML suspension 30 mL  30 mL Oral Q4H PRN Antonieta Pert, MD      . benztropine (COGENTIN) tablet 0.5 mg  0.5 mg Oral BID PRN Antonieta Pert, MD      . cloNIDine (CATAPRES) tablet 0.2 mg  0.2 mg Oral BID Antonieta Pert, MD   0.2 mg at 09/14/20 0737  . ziprasidone (GEODON) injection 20 mg  20 mg Intramuscular Q12H PRN Antonieta Pert, MD       And  . LORazepam (ATIVAN) tablet 1 mg  1 mg Oral PRN Antonieta Pert, MD      . magnesium hydroxide (MILK OF MAGNESIA) suspension 30 mL  30 mL Oral Daily PRN Antonieta Pert, MD      . paliperidone (INVEGA) 24 hr tablet 3 mg  3 mg Oral NOW Jola Babinski Marlane Mingle, MD      . paliperidone (INVEGA) 24 hr tablet 6 mg  6 mg Oral QHS Antonieta Pert, MD      . traZODone (DESYREL) tablet 150 mg  150 mg Oral QHS Antonieta Pert, MD   150 mg at 09/13/20 2051   PTA Medications: Medications Prior to Admission  Medication Sig Dispense Refill Last Dose  . benztropine (COGENTIN) 0.5 MG tablet Take 1 tablet (0.5 mg total) by mouth 2 (two) times daily as needed for  tremors (EPS). 60 tablet 2   . cloNIDine (CATAPRES) 0.2 MG tablet Take 1 tablet (0.2 mg total) by mouth 2 (two) times daily. 60 tablet 2   .  paliperidone (INVEGA SUSTENNA) 156 MG/ML SUSY injection Inject 1 mL (156 mg total) into the muscle every 30 (thirty) days. 1.2 mL 11   . risperiDONE (RISPERDAL) 3 MG tablet Take 2 tablets (6 mg total) by mouth at bedtime. 60 tablet 3   . traZODone (DESYREL) 150 MG tablet Take 150 mg by mouth at bedtime.       Musculoskeletal: Strength & Muscle Tone: within normal limits Gait & Station: normal Patient leans: N/A  Psychiatric Specialty Exam: Physical Exam Vitals and nursing note reviewed.  Constitutional:      Appearance: She is obese.  HENT:     Head: Normocephalic and atraumatic.  Pulmonary:     Effort: Pulmonary effort is normal.  Neurological:     General: No focal deficit present.     Mental Status: She is alert and oriented to person, place, and time.     Review of Systems  Blood pressure 91/60, pulse (!) 111, temperature 97.8 F (36.6 C), temperature source Oral, resp. rate 16, height  (1.626 m), weight 121.6 kg, last menstrual period 09/13/2020, SpO2 99 %.Body mass index is 46 kg/m.  General Appearance: Disheveled  Eye Contact:  Minimal  Speech:  Normal Rate  Volume:  Decreased  Mood:  Dysphoric  Affect:  Flat  Thought Process:  Disorganized and Descriptions of Associations: Loose  Orientation:  Negative  Thought Content:  Delusions, Hallucinations: Auditory and Paranoid Ideation  Suicidal Thoughts:  No  Homicidal Thoughts:  No  Memory:  Immediate;   Poor Recent;   Poor Remote;   Poor  Judgement:  Impaired  Insight:  Lacking  Psychomotor Activity:  Decreased  Concentration:  Concentration: Fair  Recall:  Poor  Fund of Knowledge:  Fair  Language:  Fair  Akathisia:  Negative  Handed:  Right  AIMS (if indicated):     Assets:  Desire for Improvement Housing Resilience Social Support  ADL's:  Impaired  Cognition:  WNL  Sleep:  Number of Hours: 4.5    Treatment Plan Summary: Daily contact with patient to assess and evaluate symptoms and progress in  treatment, Medication management and Plan  : Patient is seen and examined.  Patient is a 24 year old female with the above-stated past psychiatric history who was admitted to the hospital secondary to noncompliance of medications as well as psychotic symptoms.  She will be admitted to the hospital.  She will be integrated in the milieu.  She will be encouraged to attend groups.  She apparently received paliperidone 156 mg on 12/12.  I suspect she probably should have received the 234 mg dosage.  We will place her on oral paliperidone including 3 mg right now and 6 mg p.o. nightly starting tonight.  We will monitor to see how she does.  If her hygiene, appetite and other symptoms do not improve I may go on to give her another 156 mg dosage.  On her last psychiatric hospitalization in April 2021 she was discharged on Cogentin, clonidine, the 156 mg dose of paliperidone every 30 days, Risperdal 6 mg p.o. nightly as well as trazodone.  We will restart all of her medicines except for the oral Risperdal, and change that to oral paliperidone for consistency.  This is her fourth admission this year.  Clearly a long-acting injectable will benefit her.  Review of her admission laboratories revealed essentially normal electrolytes including a creatinine at 1.05 and normal liver function enzymes.  She had a CK done which was 891 on 12/12, but that may have been elevated secondary to the forced medication injections.  Her CBC was completely negative.  Beta-hCG was less than 1.  Urinalysis had a moderate amount of leukocytes.  She had 100 mg per DL of protein.  There were no bacteria seen, but 6-10 white blood cells.  Drug screen was negative.  Blood alcohol was less than 10.  EKG showed a sinus tachycardia with a normal QTc interval.  Her blood pressure was 129/78, and repeat was 91/60.  Pulse was 10 3-111.  She is afebrile.  Observation Level/Precautions:  15 minute checks  Laboratory:  Chemistry Profile  Psychotherapy:     Medications:    Consultations:    Discharge Concerns:    Estimated LOS:  Other:     Physician Treatment Plan for Primary Diagnosis: <principal problem not specified> Long Term Goal(s): Improvement in symptoms so as ready for discharge  Short Term Goals: Ability to identify changes in lifestyle to reduce recurrence of condition will improve, Ability to verbalize feelings will improve, Ability to demonstrate self-control will improve, Ability to identify and develop effective coping behaviors will improve, Ability to maintain clinical measurements within normal limits will improve and Compliance with prescribed medications will improve  Physician Treatment Plan for Secondary Diagnosis: Active Problems:   Paranoid schizophrenia (HCC)  Long Term Goal(s): Improvement in symptoms so as ready for discharge  Short Term Goals: Ability to identify changes in lifestyle to reduce recurrence of condition will improve, Ability to verbalize feelings will improve, Ability to demonstrate self-control will improve, Ability to identify and develop effective coping behaviors will improve, Ability to maintain clinical measurements within normal limits will improve and Compliance with prescribed medications will improve  I certify that inpatient services furnished can reasonably be expected to improve the patient's condition.    Antonieta PertGreg Lawson Johni Narine, MD 12/14/202112:48 PM

## 2020-09-14 NOTE — [facility] Group Notes (Signed)
Adult Psychoeducational Group Note  Date:  09/14/2020 Time:  10:57 AM  Group Topic/Focus:  Goals Group:   The focus of this group is to help patients establish daily goals to achieve during treatment and discuss how the patient can incorporate goal setting into their daily lives to aide in recovery.  Additional Comments:  Pt did not attend morning goals group.   Lindsey Richmond 09/14/2020, 10:57 AM

## 2020-09-14 NOTE — Progress Notes (Signed)
Progress note

## 2020-09-15 MED ORDER — LORAZEPAM 2 MG/ML IJ SOLN
2.0000 mg | Freq: Once | INTRAMUSCULAR | Status: AC
  Administered 2020-09-15: 11:00:00 2 mg via INTRAMUSCULAR
  Filled 2020-09-15: qty 1

## 2020-09-15 MED ORDER — PALIPERIDONE ER 3 MG PO TB24
3.0000 mg | ORAL_TABLET | ORAL | Status: AC
  Filled 2020-09-15: qty 1

## 2020-09-15 MED ORDER — HALOPERIDOL LACTATE 5 MG/ML IJ SOLN
10.0000 mg | Freq: Once | INTRAMUSCULAR | Status: AC
  Administered 2020-09-15: 11:00:00 10 mg via INTRAMUSCULAR
  Filled 2020-09-15 (×2): qty 2

## 2020-09-15 MED ORDER — PALIPERIDONE ER 3 MG PO TB24
9.0000 mg | ORAL_TABLET | Freq: Every day | ORAL | Status: DC
  Administered 2020-09-16 – 2020-09-23 (×6): 9 mg via ORAL
  Filled 2020-09-15 (×11): qty 3

## 2020-09-15 MED ORDER — PALIPERIDONE ER 3 MG PO TB24
3.0000 mg | ORAL_TABLET | ORAL | Status: DC
  Filled 2020-09-15: qty 1

## 2020-09-15 NOTE — Progress Notes (Signed)
Recreation Therapy Notes  12.15.21 1253:  LRT went to patient's room to complete recreation therapy assessment.  Patient was lying in bed, eyes open and not responding to LRT.  Patient just stared and didn't say anything.  LRT will attempt to complete assessment with patient on tomorrow.    Caroll Rancher, LRT/CTRS    Lillia Abed, Maxx Pham A 09/15/2020 1:25 PM

## 2020-09-15 NOTE — Progress Notes (Signed)
[facility] MD Progress Note  09/15/2020 12:23 PM Lindsey Richmond  MRN:  [id number] Subjective:  Patient is a 24 year old female with a past psychiatric history significant for schizophrenia who originally presented to the [facility] emergency department on 09/09/2020. She was brought there by EMS. The patient had apparently been off her psychiatric medications for an unknown specified amount of time. She was essentially nonverbal. She apparently was very agitated in the emergency department, and attempted to leave the emergency department. She was placed under involuntary commitment by the police.   Objective: Patient is seen and examined.  Patient is a 24 year old female with the above-stated past psychiatric history is seen in follow-up.  Unfortunately she is worsened overnight.  She did not take any oral medication last night.  She is essentially catatonic today.  She has her arm up on a wall and is staring out the window.  She does not respond to oral contact.  I asked Dr. Cindi Carbon to evaluate the patient for possible forced medication protocol.  He is in agreement with that.  I have written for Haldol as well as lorazepam to be given IM and then to reexamine the patient.  Her blood pressure is low normal at 91/60.  She is mildly tachycardic with a rate of 111.  Her p.o. intake is poor.  She did not sleep at all last night.  Her EKG showed a sinus tachycardia with a mildly elevated QTc interval of 468.  Principal Problem: <principal problem not specified> Diagnosis: Active Problems:   Paranoid schizophrenia (HCC)  Total Time spent with patient: 20 minutes  Past Psychiatric History: See admission H&P  Past Medical History:  Past Medical History:  Diagnosis Date   Anxiety    Depression    Oppositional defiant behavior    Schizo affective schizophrenia (HCC)    Schizophrenia (HCC)    History reviewed. No pertinent surgical history. Family History:  Family History   Problem Relation Age of Onset   Heart failure Mother    Hypertension Mother    Diabetes Father    Family Psychiatric  History: See admission H&P Social History:  Social History   Substance and Sexual Activity  Alcohol Use No     Social History   Substance and Sexual Activity  Drug Use No    Social History   Socioeconomic History   Marital status: Single    Spouse name: Not on file   Number of children: Not on file   Years of education: Not on file   Highest education level: Not on file  Occupational History   Occupation: Unemployed  Tobacco Use   Smoking status: Never Smoker   Smokeless tobacco: Never Used  Building services engineer Use: Never used  Substance and Sexual Activity   Alcohol use: No   Drug use: No   Sexual activity: Never    Birth control/protection: None  Other Topics Concern   Not on file  Social History Narrative   ** Merged History Encounter **       Pt lives with her mother, brother, and father.  She receives outpatient psychiatric care through [facility].   Social Determinants of Health   Financial Resource Strain: Not on file  Food Insecurity: Not on file  Transportation Needs: Not on file  Physical Activity: Not on file  Stress: Not on file  Social Connections: Not on file   Additional Social History:    Pain Medications: see MAR Prescriptions: see MAR Over the  Counter: see MAR History of alcohol / drug use?: No history of alcohol / drug abuse Longest period of sobriety (when/how long): none Negative Consequences of Use: Financial Withdrawal Symptoms: Other (Comment) (none)                    Sleep: Poor  Appetite:  Poor  Current Medications: Current Facility-Administered Medications  Medication Dose Route Frequency Provider Last Rate Last Admin   acetaminophen (TYLENOL) tablet 650 mg  650 mg Oral Q6H PRN Antonieta Pert, MD       alum & mag hydroxide-simeth (MAALOX/MYLANTA) 200-200-20 MG/5ML suspension  30 mL  30 mL Oral Q4H PRN Antonieta Pert, MD       benztropine (COGENTIN) tablet 0.5 mg  0.5 mg Oral BID PRN Antonieta Pert, MD       cloNIDine (CATAPRES) tablet 0.2 mg  0.2 mg Oral BID Antonieta Pert, MD   0.2 mg at 09/14/20 0737   ziprasidone (GEODON) injection 20 mg  20 mg Intramuscular Q12H PRN Antonieta Pert, MD       And   LORazepam (ATIVAN) tablet 1 mg  1 mg Oral PRN Antonieta Pert, MD       magnesium hydroxide (MILK OF MAGNESIA) suspension 30 mL  30 mL Oral Daily PRN Antonieta Pert, MD       paliperidone (INVEGA) 24 hr tablet 3 mg  3 mg Oral NOW Jola Babinski, Marlane Mingle, MD       paliperidone (INVEGA) 24 hr tablet 3 mg  3 mg Oral NOW Jola Babinski Marlane Mingle, MD       paliperidone (INVEGA) 24 hr tablet 9 mg  9 mg Oral QHS Antonieta Pert, MD       traZODone (DESYREL) tablet 150 mg  150 mg Oral QHS Antonieta Pert, MD   150 mg at 09/13/20 2051    Lab Results: No results found for this or any previous visit (from the past 48 hour(s)).  Blood Alcohol level:  Lab Results  Component Value Date   ETH <10 09/09/2020   ETH <10 01/04/2020    Metabolic Disorder Labs: Lab Results  Component Value Date   HGBA1C 5.6 12/27/2019   MPG 114.02 12/27/2019   MPG 105.41 12/04/2019   Lab Results  Component Value Date   PROLACTIN 57.7 (H) 12/04/2019   PROLACTIN 75.7 (H) 10/23/2019   Lab Results  Component Value Date   CHOL 145 12/27/2019   TRIG 59 12/27/2019   HDL 24 (L) 12/27/2019   CHOLHDL 6.0 12/27/2019   VLDL 12 12/27/2019   LDLCALC 109 (H) 12/27/2019   LDLCALC 100 (H) 12/04/2019    Physical Findings: AIMS: Facial and Oral Movements Muscles of Facial Expression: None, normal Lips and Perioral Area: None, normal Jaw: None, normal Tongue: None, normal,Extremity Movements Upper (arms, wrists, hands, fingers): None, normal Lower (legs, knees, ankles, toes): None, normal, Trunk Movements Neck, shoulders, hips: None, normal, Overall Severity Severity  of abnormal movements (highest score from questions above): None, normal Incapacitation due to abnormal movements: None, normal Patient's awareness of abnormal movements (rate only patient's report): No Awareness, Dental Status Current problems with teeth and/or dentures?: No Does patient usually wear dentures?: No  CIWA:  CIWA-Ar Total: 2 COWS:  COWS Total Score: 4  Musculoskeletal: Strength & Muscle Tone: within normal limits Gait & Station: shuffle Patient leans: N/A  Psychiatric Specialty Exam: Physical Exam Vitals and nursing note reviewed.  Constitutional:      Appearance:  She is obese.  HENT:     Head: Normocephalic and atraumatic.  Pulmonary:     Effort: Pulmonary effort is normal.  Neurological:     General: No focal deficit present.     Mental Status: She is alert.     Review of Systems  Blood pressure 91/60, pulse (!) 111, temperature 97.8 F (36.6 C), temperature source Oral, resp. rate 16, height 5\' 4"  (1.626 m), weight 121.6 kg, last menstrual period 09/13/2020, SpO2 99 %.Body mass index is 46 kg/m.  General Appearance: Disheveled  Eye Contact:  Poor  Speech:  Blocked  Volume:  Essentially mute  Mood:  Dysphoric  Affect:  Flat  Thought Process:  Disorganized and Descriptions of Associations: Loose  Orientation:  Negative  Thought Content:  Delusions, Hallucinations: Auditory, Paranoid Ideation and Rumination  Suicidal Thoughts:  No  Homicidal Thoughts:  No  Memory:  Immediate;   Poor Recent;   Poor Remote;   Poor  Judgement:  Impaired  Insight:  Lacking  Psychomotor Activity:  Psychomotor Retardation  Concentration:  Concentration: Poor and Attention Span: Poor  Recall:  Poor  Fund of Knowledge:  Poor  Language:  Poor  Akathisia:  Negative  Handed:  Right  AIMS (if indicated):     Assets:  Desire for Improvement Resilience  ADL's:  Impaired  Cognition:  WNL  Sleep:  Number of Hours: 0     Treatment Plan Summary: Daily contact with patient  to assess and evaluate symptoms and progress in treatment, Medication management and Plan : Patient is seen and examined.  Patient is a 24 year old female with the above-stated past psychiatric history who is seen in follow-up.   Diagnosis: 1.  Schizophrenia; catatonic type 2.  Poor p.o. intake  Pertinent findings on examination today: 1.  Patient is essentially catatonic.  Plan: 1.  Forced medication protocol. 2.  Give Haldol 10 mg IM and lorazepam 2 mg IM x1 now to relieve catatonia. 3.  Stop Cogentin and clonidine. 4.  Continue paliperidone 3 mg now and 9 mg p.o. nightly for psychosis. 5.  If patient refuses oral paliperidone consideration of paliperidone long-acting injectable 156 mg IM x1 for psychosis. 6.  Continue trazodone 150 mg p.o. nightly for insomnia. 7.  Force fluids. 8.  Will order electrolytes in a.m. tomorrow, if patient becomes significantly dehydrated we may need to send her to the [facility] emergency room for IV fluids. 9.  Disposition planning-in progress.  [facility], MD 09/15/2020, 12:23 PM

## 2020-09-15 NOTE — Progress Notes (Signed)
Pt has been standing on one spot at window all night long looking outside. Pt offered her scheduled medication and refused. Pt encouraged to go bed but in vain. Pt is selectively mutism, will not respond when spoken too. Support and encouragement offered, will continue to monitor.

## 2020-09-15 NOTE — Progress Notes (Signed)
Dar Note: Patient presents with bizarre and disorganized behavior.  Patient observed standing in front of the window staring out and talking to herself.  She screamed and asked staff to leave her room when medication was brought to her.  Refused oral medication and vital signs.  Ativan 2 mg and Haldol 10 mg IM given with fair effect.  Patient was able to come out of her room with minimal interaction with staff.  Able to make her needs known.  Routine safety checks maintained.  Patient is safe on the unit.

## 2020-09-15 NOTE — Progress Notes (Addendum)
   09/15/20 2143  Psych Admission Type (Psych Patients Only)  Admission Status Involuntary  Psychosocial Assessment  Patient Complaints Irritability  Eye Contact Brief  Facial Expression Flat  Affect Preoccupied;Blunted  Speech Logical/coherent;Slow  Motor Activity Slow  Appearance/Hygiene Body odor;Poor hygiene;In scrubs  Behavior Characteristics Unwilling to participate  Mood Preoccupied  Thought Process  Coherency Concrete thinking;Blocking  Content Preoccupation  Delusions None reported or observed  Perception WDL  Hallucination None reported or observed  Judgment Limited  Confusion None  Danger to Self  Current suicidal ideation? Denies  Danger to Others  Danger to Others None reported or observed   Pt standing in room at the window, refusing to speak. Pt still disheveled and odorous. Pt refused medication. Tried to get her to take [facility] but pt spit pills out into cup of water. Pt returned to her room. No medication ordered for forced meds. Will continue to monitor.

## 2020-09-15 NOTE — Progress Notes (Signed)
[facility] Second Physician Opinion Progress Note for Medication Administration to Non-consenting Patients (For Involuntarily Committed Patients)  Patient: Lindsey Richmond Date of Birth: [birth year] MRN: [id number]  Reason for the Medication: The patient, without the benefit of the specific treatment measure, is incapable of participating in any available treatment plan that will give the patient a realistic opportunity of improving the patient's condition.  Consideration of Side Effects: Consideration of the side effects related to the medication plan has been given.  Rationale for Medication Administration: Patient presenting in a catatonic-like state secondary to psychosis.  Unable to maintain ADLs.  Order to care for self in the community.  Patient will require treatment to improve overall.  At this time patient is unwilling to cooperate in any sense due to her physical state.  She will require medications over objection.    Clement Sayres, MD 09/15/20  9:53 AM   This documentation is good for (7) seven days from the date of the MD signature. New documentation must be completed every seven (7) days with detailed justification in the medical record if the patient requires continued non-emergent administration of psychotropic medications.

## 2020-09-15 NOTE — Tx Team (Signed)
Interdisciplinary Treatment and Diagnostic Plan Update  09/15/2020 Time of Session: 9:25am Lindsey Richmond MRN: [id number]  Principal Diagnosis: <principal problem not specified>  Secondary Diagnoses: Active Problems:   Paranoid schizophrenia ([place])   Current Medications:  Current Facility-Administered Medications  Medication Dose Route Frequency Provider Last Rate Last Admin  . acetaminophen (TYLENOL) tablet 650 mg  650 mg Oral Q6H PRN Sharma Covert, MD      . alum & mag hydroxide-simeth (MAALOX/MYLANTA) 200-200-20 MG/5ML suspension 30 mL  30 mL Oral Q4H PRN Sharma Covert, MD      . benztropine (COGENTIN) tablet 0.5 mg  0.5 mg Oral BID PRN Sharma Covert, MD      . cloNIDine (CATAPRES) tablet 0.2 mg  0.2 mg Oral BID Sharma Covert, MD   0.2 mg at 09/14/20 0737  . ziprasidone (GEODON) injection 20 mg  20 mg Intramuscular Q12H PRN Sharma Covert, MD       And  . LORazepam (ATIVAN) tablet 1 mg  1 mg Oral PRN Sharma Covert, MD      . magnesium hydroxide (MILK OF MAGNESIA) suspension 30 mL  30 mL Oral Daily PRN Sharma Covert, MD      . paliperidone (INVEGA) 24 hr tablet 3 mg  3 mg Oral NOW Mallie Darting, Cordie Grice, MD      . paliperidone (INVEGA) 24 hr tablet 3 mg  3 mg Oral NOW Mallie Darting Cordie Grice, MD      . paliperidone (INVEGA) 24 hr tablet 9 mg  9 mg Oral QHS Sharma Covert, MD      . traZODone (DESYREL) tablet 150 mg  150 mg Oral QHS Sharma Covert, MD   150 mg at 09/13/20 2051   PTA Medications: Medications Prior to Admission  Medication Sig Dispense Refill Last Dose  . benztropine (COGENTIN) 0.5 MG tablet Take 1 tablet (0.5 mg total) by mouth 2 (two) times daily as needed for tremors (EPS). 60 tablet 2   . cloNIDine (CATAPRES) 0.2 MG tablet Take 1 tablet (0.2 mg total) by mouth 2 (two) times daily. 60 tablet 2   . paliperidone (INVEGA SUSTENNA) 156 MG/ML SUSY injection Inject 1 mL (156 mg total) into the muscle every 30 (thirty) days. 1.2 mL 11   .  risperiDONE (RISPERDAL) 3 MG tablet Take 2 tablets (6 mg total) by mouth at bedtime. 60 tablet 3   . traZODone (DESYREL) 150 MG tablet Take 150 mg by mouth at bedtime.       Patient Stressors: Financial difficulties Marital or family conflict Medication change or noncompliance  Patient Strengths: Motivation for treatment/growth Physical Health Supportive family/friends  Treatment Modalities: Medication Management, Group therapy, Case management,  1 to 1 session with clinician, Psychoeducation, Recreational therapy.   Physician Treatment Plan for Primary Diagnosis: <principal problem not specified> Long Term Goal(s): Improvement in symptoms so as ready for discharge Improvement in symptoms so as ready for discharge   Short Term Goals: Ability to identify changes in lifestyle to reduce recurrence of condition will improve Ability to verbalize feelings will improve Ability to demonstrate self-control will improve Ability to identify and develop effective coping behaviors will improve Ability to maintain clinical measurements within normal limits will improve Compliance with prescribed medications will improve Ability to identify changes in lifestyle to reduce recurrence of condition will improve Ability to verbalize feelings will improve Ability to demonstrate self-control will improve Ability to identify and develop effective coping behaviors will improve Ability to maintain clinical  measurements within normal limits will improve Compliance with prescribed medications will improve  Medication Management: Evaluate patient's response, side effects, and tolerance of medication regimen.  Therapeutic Interventions: 1 to 1 sessions, Unit Group sessions and Medication administration.  Evaluation of Outcomes: Not Met  Physician Treatment Plan for Secondary Diagnosis: Active Problems:   Paranoid schizophrenia ([place])  Long Term Goal(s): Improvement in symptoms so as ready for  discharge Improvement in symptoms so as ready for discharge   Short Term Goals: Ability to identify changes in lifestyle to reduce recurrence of condition will improve Ability to verbalize feelings will improve Ability to demonstrate self-control will improve Ability to identify and develop effective coping behaviors will improve Ability to maintain clinical measurements within normal limits will improve Compliance with prescribed medications will improve Ability to identify changes in lifestyle to reduce recurrence of condition will improve Ability to verbalize feelings will improve Ability to demonstrate self-control will improve Ability to identify and develop effective coping behaviors will improve Ability to maintain clinical measurements within normal limits will improve Compliance with prescribed medications will improve     Medication Management: Evaluate patient's response, side effects, and tolerance of medication regimen.  Therapeutic Interventions: 1 to 1 sessions, Unit Group sessions and Medication administration.  Evaluation of Outcomes: Not Met   RN Treatment Plan for Primary Diagnosis: <principal problem not specified> Long Term Goal(s): Knowledge of disease and therapeutic regimen to maintain health will improve  Short Term Goals: Ability to remain free from injury will improve, Ability to verbalize frustration and anger appropriately will improve, Ability to identify and develop effective coping behaviors will improve and Compliance with prescribed medications will improve  Medication Management: RN will administer medications as ordered by provider, will assess and evaluate patient's response and provide education to patient for prescribed medication. RN will report any adverse and/or side effects to prescribing provider.  Therapeutic Interventions: 1 on 1 counseling sessions, Psychoeducation, Medication administration, Evaluate responses to treatment, Monitor vital  signs and CBGs as ordered, Perform/monitor CIWA, COWS, AIMS and Fall Risk screenings as ordered, Perform wound care treatments as ordered.  Evaluation of Outcomes: Not Met   LCSW Treatment Plan for Primary Diagnosis: <principal problem not specified> Long Term Goal(s): Safe transition to appropriate next level of care at discharge, Engage patient in therapeutic group addressing interpersonal concerns.  Short Term Goals: Engage patient in aftercare planning with referrals and resources, Increase social support, Identify triggers associated with mental health/substance abuse issues and Increase skills for wellness and recovery  Therapeutic Interventions: Assess for all discharge needs, 1 to 1 time with Social worker, Explore available resources and support systems, Assess for adequacy in community support network, Educate family and significant other(s) on suicide prevention, Complete Psychosocial Assessment, Interpersonal group therapy.  Evaluation of Outcomes: Not Met   Progress in Treatment: Attending groups: No. Participating in groups: No. Taking medication as prescribed: No. Toleration medication: No. Family/Significant other contact made: No, will contact:  patient declined consents Patient understands diagnosis: No. Discussing patient identified problems/goals with staff: No. Medical problems stabilized or resolved: Yes. Denies suicidal/homicidal ideation: Yes. Issues/concerns per patient self-inventory: No.   New problem(s) identified: Yes, Describe:  Patient currently non-verbal and catotanic.   New Short Term/Long Term Goal(s): medication stabilization, elimination of SI thoughts, development of comprehensive mental wellness plan.   Patient Goals:  Patient unable to participate in meeting due to being non-verbal at this time.  Discharge Plan or Barriers: Patient established with [facility] for medication management. Patient is able  to return to live with mother post  discharge.  Reason for Continuation of Hospitalization: Delusions  Hallucinations Medication stabilization Other; describe [place]   Estimated Length of Stay: 3-5 days  Attendees: Patient: Lindsey Richmond 09/15/2020   Physician: Lala Lund, MD 09/15/2020   Nursing:  09/15/2020   RN Care Manager: 09/15/2020  Social Worker: Darletta Moll, LCSW 09/15/2020   Recreational Therapist:  09/15/2020   Other:  09/15/2020  Other:  09/15/2020   Other: 09/15/2020       Scribe for Treatment Team: Vassie Moselle, LCSW 09/15/2020 11:08 AM

## 2020-09-16 LAB — BASIC METABOLIC PANEL
Anion gap: 11 (ref 5–15)
BUN: 24 mg/dL — ABNORMAL HIGH (ref 6–20)
CO2: 26 mmol/L (ref 22–32)
Calcium: 9.2 mg/dL (ref 8.9–10.3)
Chloride: 102 mmol/L (ref 98–111)
Creatinine, Ser: 1.08 mg/dL — ABNORMAL HIGH (ref 0.44–1.00)
GFR, Estimated: >60 mL/min (ref >60)
Glucose, Bld: 126 mg/dL — ABNORMAL HIGH (ref 70–99)
Potassium: 3.7 mmol/L (ref 3.5–5.1)
Sodium: 139 mmol/L (ref 135–145)

## 2020-09-16 MED ORDER — HALOPERIDOL 5 MG PO TABS: 10.0000 mg | ORAL_TABLET | Freq: Two times a day (BID) | ORAL | Status: DC | PRN

## 2020-09-16 MED ORDER — LORAZEPAM 1 MG PO TABS
2.0000 mg | ORAL_TABLET | Freq: Two times a day (BID) | ORAL | Status: DC | PRN
  Administered 2020-09-16: 10:00:00 2 mg via ORAL

## 2020-09-16 MED ORDER — PALIPERIDONE ER 3 MG PO TB24
3.0000 mg | ORAL_TABLET | ORAL | Status: DC
  Filled 2020-09-16: qty 1

## 2020-09-16 MED ORDER — LORAZEPAM 2 MG/ML IJ SOLN: 2.0000 mg | Freq: Two times a day (BID) | INTRAMUSCULAR | Status: DC

## 2020-09-16 MED ORDER — HALOPERIDOL LACTATE 5 MG/ML IJ SOLN
10.0000 mg | Freq: Two times a day (BID) | INTRAMUSCULAR | Status: DC | PRN
  Administered 2020-09-16: 10:00:00 10 mg via INTRAMUSCULAR
  Filled 2020-09-16: qty 2

## 2020-09-16 MED ORDER — LORAZEPAM 2 MG/ML IJ SOLN
2.0000 mg | Freq: Two times a day (BID) | INTRAMUSCULAR | Status: DC | PRN
  Filled 2020-09-16: qty 1

## 2020-09-16 MED ORDER — LORAZEPAM 1 MG PO TABS
2.0000 mg | ORAL_TABLET | Freq: Two times a day (BID) | ORAL | Status: DC
  Administered 2020-09-16 – 2020-09-20 (×8): 2 mg via ORAL
  Filled 2020-09-16 (×8): qty 2

## 2020-09-16 MED ORDER — HALOPERIDOL LACTATE 5 MG/ML IJ SOLN
10.0000 mg | Freq: Two times a day (BID) | INTRAMUSCULAR | Status: DC
  Filled 2020-09-16 (×4): qty 2

## 2020-09-16 MED ORDER — HALOPERIDOL 5 MG PO TABS
10.0000 mg | ORAL_TABLET | Freq: Two times a day (BID) | ORAL | Status: DC
  Administered 2020-09-16 – 2020-09-17 (×2): 10 mg via ORAL
  Filled 2020-09-16 (×5): qty 2

## 2020-09-16 NOTE — [facility] Group Notes (Signed)
The focus of this group is to help patients establish daily goals to achieve during treatment and discuss how the patient can incorporate goal setting into their daily lives to aide in recovery.  Pt did not attend group 

## 2020-09-16 NOTE — Progress Notes (Signed)
[facility] MD Progress Note  09/16/2020 11:21 AM Lindsey Richmond  MRN:  [id number] Subjective:  Patient is a 24 year old female with a past psychiatric history significant for schizophrenia who originally presented to the [facility] emergency department on 09/09/2020. She was brought there by EMS. The patient had apparently been off her psychiatric medications for an unknown specified amount of time. She was essentially nonverbal. She apparently was very agitated in the emergency department, and attempted to leave the emergency department. She was placed under involuntary commitment by the police.   Objective: Patient is seen and examined.  Patient is a 24 year old female with the above-stated past psychiatric history who is seen in follow-up.  Yesterday after receiving the Haldol and the Ativan IM she did better.  She was drinking fluids.  Unfortunately I made her medications as needed with regard to the Haldol and the Ativan, and this morning she is back to being catatonic again.  I wrote for a one-time dose, and we have decided to continue 10 mg Haldol/2 mg Ativan twice daily until we can get her to take oral medicines and improve.  Her blood pressure remains somewhat low.  Initially is 129/78 and then dropped to 91/60.  Her pulse is mildly elevated at 103-111.  She is afebrile.  She did sleep a bit better last night.  She got 5.25 hours.  No new laboratories.  Principal Problem: <principal problem not specified> Diagnosis: Active Problems:   Paranoid schizophrenia (HCC)  Total Time spent with patient: 20 minutes  Past Psychiatric History: See admission H&P  Past Medical History:  Past Medical History:  Diagnosis Date  . Anxiety   . Depression   . Oppositional defiant behavior   . Schizo affective schizophrenia (HCC)   . Schizophrenia (HCC)    History reviewed. No pertinent surgical history. Family History:  Family History  Problem Relation Age of Onset  . Heart failure  Mother   . Hypertension Mother   . Diabetes Father    Family Psychiatric  History: See admission H&P Social History:  Social History   Substance and Sexual Activity  Alcohol Use No     Social History   Substance and Sexual Activity  Drug Use No    Social History   Socioeconomic History  . Marital status: Single    Spouse name: Not on file  . Number of children: Not on file  . Years of education: Not on file  . Highest education level: Not on file  Occupational History  . Occupation: Unemployed  Tobacco Use  . Smoking status: Never Smoker  . Smokeless tobacco: Never Used  Vaping Use  . Vaping Use: Never used  Substance and Sexual Activity  . Alcohol use: No  . Drug use: No  . Sexual activity: Never    Birth control/protection: None  Other Topics Concern  . Not on file  Social History Narrative   ** Merged History Encounter **       Pt lives with her mother, brother, and father.  She receives outpatient psychiatric care through [facility].   Social Determinants of Health   Financial Resource Strain: Not on file  Food Insecurity: Not on file  Transportation Needs: Not on file  Physical Activity: Not on file  Stress: Not on file  Social Connections: Not on file   Additional Social History:    Pain Medications: see MAR Prescriptions: see MAR Over the Counter: see MAR History of alcohol / drug use?: No history of alcohol /  drug abuse Longest period of sobriety (when/how long): none Negative Consequences of Use: Financial Withdrawal Symptoms: Other (Comment) (none)                    Sleep: Fair  Appetite:  Poor  Current Medications: Current Facility-Administered Medications  Medication Dose Route Frequency Provider Last Rate Last Admin  . acetaminophen (TYLENOL) tablet 650 mg  650 mg Oral Q6H PRN Antonieta Pert, MD      . alum & mag hydroxide-simeth (MAALOX/MYLANTA) 200-200-20 MG/5ML suspension 30 mL  30 mL Oral Q4H PRN Antonieta Pert, MD       . haloperidol (HALDOL) tablet 10 mg  10 mg Oral BID Antonieta Pert, MD       Or  . haloperidol lactate (HALDOL) injection 10 mg  10 mg Intramuscular BID Antonieta Pert, MD      . LORazepam (ATIVAN) tablet 2 mg  2 mg Oral BID Antonieta Pert, MD       Or  . LORazepam (ATIVAN) injection 2 mg  2 mg Intramuscular BID Antonieta Pert, MD      . ziprasidone (GEODON) injection 20 mg  20 mg Intramuscular Q12H PRN Antonieta Pert, MD       And  . LORazepam (ATIVAN) tablet 1 mg  1 mg Oral PRN Antonieta Pert, MD      . magnesium hydroxide (MILK OF MAGNESIA) suspension 30 mL  30 mL Oral Daily PRN Antonieta Pert, MD      . paliperidone (INVEGA) 24 hr tablet 3 mg  3 mg Oral NOW Jola Babinski Marlane Mingle, MD      . paliperidone (INVEGA) 24 hr tablet 9 mg  9 mg Oral QHS Antonieta Pert, MD      . traZODone (DESYREL) tablet 150 mg  150 mg Oral QHS Antonieta Pert, MD   150 mg at 09/13/20 2051    Lab Results: No results found for this or any previous visit (from the past 48 hour(s)).  Blood Alcohol level:  Lab Results  Component Value Date   ETH <10 09/09/2020   ETH <10 01/04/2020    Metabolic Disorder Labs: Lab Results  Component Value Date   HGBA1C 5.6 12/27/2019   MPG 114.02 12/27/2019   MPG 105.41 12/04/2019   Lab Results  Component Value Date   PROLACTIN 57.7 (H) 12/04/2019   PROLACTIN 75.7 (H) 10/23/2019   Lab Results  Component Value Date   CHOL 145 12/27/2019   TRIG 59 12/27/2019   HDL 24 (L) 12/27/2019   CHOLHDL 6.0 12/27/2019   VLDL 12 12/27/2019   LDLCALC 109 (H) 12/27/2019   LDLCALC 100 (H) 12/04/2019    Physical Findings: AIMS: Facial and Oral Movements Muscles of Facial Expression: None, normal Lips and Perioral Area: None, normal Jaw: None, normal Tongue: None, normal,Extremity Movements Upper (arms, wrists, hands, fingers): None, normal Lower (legs, knees, ankles, toes): None, normal, Trunk Movements Neck, shoulders, hips: None,  normal, Overall Severity Severity of abnormal movements (highest score from questions above): None, normal Incapacitation due to abnormal movements: None, normal Patient's awareness of abnormal movements (rate only patient's report): No Awareness, Dental Status Current problems with teeth and/or dentures?: No Does patient usually wear dentures?: No  CIWA:  CIWA-Ar Total: 2 COWS:  COWS Total Score: 4  Musculoskeletal: Strength & Muscle Tone: within normal limits Gait & Station: normal Patient leans: N/A  Psychiatric Specialty Exam: Physical Exam Vitals and nursing note reviewed.  HENT:     Head: Normocephalic and atraumatic.  Pulmonary:     Effort: Pulmonary effort is normal.  Neurological:     General: No focal deficit present.     Mental Status: She is alert.     Review of Systems  Blood pressure 91/60, pulse (!) 111, temperature 97.8 F (36.6 C), temperature source Oral, resp. rate 16, height 5\' 4"  (1.626 m), weight 121.6 kg, last menstrual period 09/13/2020, SpO2 99 %.Body mass index is 46 kg/m.  General Appearance: Disheveled  Eye Contact:  None  Speech:  Essentially mute  Volume:  Essentially mute  Mood:  Dysphoric  Affect:  Flat  Thought Process:  Descriptions of Associations: Loose  Orientation:  Other:  Essentially mute  Thought Content:  Delusions, Hallucinations: Auditory and Paranoid Ideation  Suicidal Thoughts:  No  Homicidal Thoughts:  No  Memory:  Immediate;   Poor Recent;   Poor Remote;   Poor  Judgement:  Impaired  Insight:  Lacking  Psychomotor Activity:  Psychomotor Retardation  Concentration:  Concentration: Poor and Attention Span: Poor  Recall:  Poor  Fund of Knowledge:  Poor  Language:  Poor  Akathisia:  Negative  Handed:  Right  AIMS (if indicated):     Assets:  Desire for Improvement Housing Resilience Social Support  ADL's:  Impaired  Cognition:  WNL  Sleep:  Number of Hours: 5.25     Treatment Plan Summary: Daily contact with  patient to assess and evaluate symptoms and progress in treatment, Medication management and Plan : Patient is seen and examined.  Patient is a 24 year old female with the above-stated past psychiatric history who is seen in follow-up.   Diagnosis: 1.  Schizophrenia; catatonic type 2.  Poor p.o. intake  Pertinent findings on examination today: 1.  Patient is essentially catatonic this AM. 2.  Patient did respond to the combination of Haldol/Ativan yesterday and we will continue that.  Plan: 1.  Forced medication protocol. 2.  Continue Haldol 10 mg IM and lorazepam 2 mg IM twice daily to relieve catatonia and psychosis. 3. Continue paliperidone 3 mg now and 9 mg p.o. nightly for psychosis. 5.  If patient refuses oral paliperidone consideration of paliperidone long-acting injectable 156 mg IM x1 for psychosis. 6.  Continue trazodone 150 mg p.o. nightly for insomnia. 7.  Force fluids. 8.    Patient apparently refused laboratories this morning.  Will order electrolytes 4 PM today. If patient becomes significantly dehydrated we may need to send her to the [facility] emergency room for IV fluids. 9.  Disposition planning-in progress.  [facility], MD 09/16/2020, 11:21 AM

## 2020-09-16 NOTE — Progress Notes (Signed)
Recreation Therapy Notes  Date: 12.16.21 Time: 8768-1157 Location: [address] Dayroom  Group Topic: Self-Esteem  Goal Area(s) Addresses:  Patient will identify positive character traits about themselves. Patient will reflect on positive ways to increase self-esteem. Patient will verbalize benefit of increased self-esteem.  Intervention: Blank license plates, Markers, Colored pencils  Activity: Personalized License Plates.  Patients were to create a personalized license plate to highlight some of the positive things about themselves.  Patients could highlights things such as were they are from, sports teams they like, personality traits, important people to them, etc.  Education:  Self-esteem, Discharge planning  Education Outcome: Acknowledges understanding/In group clarification offered/Needs additional education  Clinical Observations/Feedback: Pt did not attend group session.     Caroll Rancher, LRT/CTRS         Lillia Abed, Masson Nalepa A 09/16/2020 11:30 AM

## 2020-09-16 NOTE — [facility] Group Notes (Signed)
Occupational Therapy Group Note Date: 09/16/2020 Group Topic/Focus: Socialization/Social Skills  Group Description: Group encouraged increased social engagement and participation through discussion/activity focused on improving socialization and age appropriate social skills. Patients were encouraged to fill out a structured worksheet with the following three prompts: one thing I want you to know about me, the hardest thing that I am dealing with today is, and my goal for today is... Discussion followed with patients sharing their responses and then transitioned into an interactive "Name 5" activity to promote socialization and orientation.   Therapeutic Goal(s): Demonstrate age-appropriate social skills and socialization within a structured group setting Demonstrate orientation to topic and identify relevant responses to group discussion.  Participation Level: Patient did not attend OT group session despite personal invitation. Pt was asleep in bed and was not arousable to invite.   Plan: Continue to engage patient in OT groups 2 - 3x/week.  09/16/2020  Donne Hazel, MOT, OTR/L

## 2020-09-16 NOTE — Progress Notes (Addendum)
   09/16/20 2301  Psych Admission Type (Psych Patients Only)  Admission Status Involuntary  Psychosocial Assessment  Patient Complaints None  Eye Contact Brief  Facial Expression Flat  Affect Flat  Speech Logical/coherent;Slow  Interaction Other (Comment) (pt in bed)  Motor Activity Slow  Appearance/Hygiene Disheveled  Behavior Characteristics Cooperative  Mood Other (Comment) (appropriate)  Thought Process  Coherency Concrete thinking  Content WDL  Delusions None reported or observed  Perception WDL  Hallucination None reported or observed  Judgment Limited  Confusion None  Danger to Self  Current suicidal ideation? Denies  Danger to Others  Danger to Others None reported or observed   Pt observed in bed but not hard to rouse. Pt denies SI, HI, AVH and pain. Pt was able to take her nightly oral dose of Invega 9 mg without incident.

## 2020-09-17 MED ORDER — PALIPERIDONE PALMITATE ER 156 MG/ML IM SUSY
156.0000 mg | PREFILLED_SYRINGE | Freq: Once | INTRAMUSCULAR | Status: AC
  Administered 2020-09-17: 16:00:00 156 mg via INTRAMUSCULAR

## 2020-09-17 MED ORDER — HALOPERIDOL LACTATE 5 MG/ML IJ SOLN
15.0000 mg | Freq: Every day | INTRAMUSCULAR | Status: DC
  Filled 2020-09-17 (×3): qty 3

## 2020-09-17 MED ORDER — PALIPERIDONE PALMITATE ER 156 MG/ML IM SUSY
156.0000 mg | PREFILLED_SYRINGE | Freq: Once | INTRAMUSCULAR | Status: DC
  Filled 2020-09-17: qty 1

## 2020-09-17 MED ORDER — HALOPERIDOL LACTATE 5 MG/ML IJ SOLN
5.0000 mg | Freq: Every day | INTRAMUSCULAR | Status: DC
  Filled 2020-09-17 (×3): qty 1

## 2020-09-17 MED ORDER — HALOPERIDOL 5 MG PO TABS
5.0000 mg | ORAL_TABLET | Freq: Every day | ORAL | Status: DC
  Administered 2020-09-18 – 2020-09-19 (×2): 5 mg via ORAL
  Filled 2020-09-17 (×3): qty 1

## 2020-09-17 MED ORDER — HALOPERIDOL 5 MG PO TABS
15.0000 mg | ORAL_TABLET | Freq: Every day | ORAL | Status: DC
  Administered 2020-09-18: 21:00:00 15 mg via ORAL
  Filled 2020-09-17 (×3): qty 3

## 2020-09-17 NOTE — Progress Notes (Signed)
   09/17/20 2100  Psych Admission Type (Psych Patients Only)  Admission Status Involuntary  Psychosocial Assessment  Patient Complaints Anxiety;Suspiciousness  Eye Contact Avoids;Brief  Facial Expression Blank;Flat  Affect Flat  Speech Logical/coherent;Slow  Interaction Assertive  Motor Activity Fidgety;Slow  Appearance/Hygiene Body odor;Disheveled;Poor hygiene;In scrubs  Behavior Characteristics Cooperative  Mood Preoccupied  Thought Process  Coherency Concrete thinking  Content Paranoia  Delusions Paranoid  Perception WDL  Hallucination None reported or observed  Judgment Limited  Confusion None  Danger to Self  Current suicidal ideation? Denies  Danger to Others  Danger to Others None reported or observed

## 2020-09-17 NOTE — [facility] Group Notes (Signed)
[facility] LCSW Group Therapy  09/17/2020 11:54 AM   Type of Therapy and Topic: Group Therapy: Anger Management   Description of Group: In this group, patients will learn helpful strategies and techniques to manage anger, express anger in alternative ways, change hostile attitudes, and prevent aggressive acts, such as verbal abuse and violence.This group will be process-oriented and eductional, with patients participating in exploration of their own experiences as well as giving and receiving support and challenge from other group members.  Therapeutic Goals: 1. Patient will learn to manage anger. 2. Patient will learn to stop violence or the threat of violence. 3. Patient will learn to develop self control over thoughts and actions. 4. Patient will receive support and feedback from others   Participation Level:  Active  Participation Quality:  Appropriate and Attentive  Affect:  Appropriate  Cognitive:  Appropriate  Insight:  Developing/Improving  Engagement in Therapy:  Engaged   Therapeutic Modalities: Cognitive Behavioral Therapy Solution Focused Therapy Motivational Interviewing  Summary of Progress/Problems: Patient attended and participated in group. Patient shared of recent experience that caused her to become angry and how she responded to this situation.   Won Kreuzer A Carlina Derks 09/17/2020, 11:54 AM

## 2020-09-17 NOTE — Plan of Care (Signed)
  Problem: Education: Goal: Ability to state activities that reduce stress will improve Outcome: Progressing   Problem: Coping: Goal: Ability to identify and develop effective coping behavior will improve Outcome: Progressing   Problem: Education: Goal: Utilization of techniques to improve thought processes will improve Outcome: Progressing   

## 2020-09-17 NOTE — [facility] Group Notes (Signed)
The focus of this group is to help patients establish daily goals to achieve during treatment and discuss how the patient can incorporate goal setting into their daily lives to aide in recovery.   Patient did not attend group. 

## 2020-09-17 NOTE — Progress Notes (Signed)
Progress note    09/17/20 0825  Psych Admission Type (Psych Patients Only)  Admission Status Involuntary  Psychosocial Assessment  Patient Complaints Anxiety  Eye Contact Avoids;Brief  Facial Expression Blank;Flat  Affect Flat  Speech Logical/coherent;Slow  Interaction Assertive  Motor Activity Fidgety;Slow  Appearance/Hygiene Body odor;Disheveled;Poor hygiene;In scrubs  Behavior Characteristics Cooperative;Appropriate to situation;Anxious  Mood Anxious;Preoccupied;Pleasant  Thought Process  Coherency Concrete thinking  Content Paranoia  Delusions Paranoid  Perception WDL  Hallucination None reported or observed  Judgment Limited  Confusion None  Danger to Self  Current suicidal ideation? Denies  Danger to Others  Danger to Others None reported or observed

## 2020-09-17 NOTE — Progress Notes (Signed)
[facility] MD Progress Note  09/17/2020 2:00 PM Jemmie Ledgerwood  MRN:  [id number] Subjective:  Patient is a 24 year old female with a past psychiatric history significant for schizophrenia who originally presented to the [facility] emergency department on 09/09/2020. She was brought there by EMS. The patient had apparently been off her psychiatric medications for an unknown specified amount of time. She was essentially nonverbal. She apparently was very agitated in the emergency department, and attempted to leave the emergency department. She was placed under involuntary commitment by the police.  Objective: Patient is seen and examined.  Patient is a 24 year old female with the above-stated past psychiatric history who is seen in follow-up.  She was actually seen twice today.  Earlier in the morning I saw her and she was essentially catatonic once again.  I increased her forced medication Haldol to 5 mg p.o. or IM daily and 15 mg p.o. or IM nightly.  This was because of her continued catatonic and psychotic state.  Also she was not sleeping.  She continues to get the forced lorazepam as well which has helped in her catatonic schizophrenia.  I also went on and ordered an additional paliperidone 156 mg dosage IM this morning.  This was because of lack of improvement so far with her psychotic state.  Early in the afternoon she came to my office in 1 to speak with me.  She was more animated.  She stated she wanted to know when discharge was.  She was semiappropriate.  Her blood pressure is still mildly labile.  Initially her blood pressure was 141/95, and repeat was 133/66.  She is tachycardic.  Initially her rate was 110 and repeat was 135.  Nursing notes reflect she slept approximately 6-1/2 hours last night.  Laboratories from 12/16 showed a mildly elevated glucose at 126.  Creatinine was 1.08.  Her EKG from admission showed a sinus tachycardia with a QTc interval of 468.  Principal Problem:  <principal problem not specified> Diagnosis: Active Problems:   Paranoid schizophrenia (HCC)  Total Time spent with patient: 20 minutes  Past Psychiatric History: See admission H&P  Past Medical History:  Past Medical History:  Diagnosis Date  . Anxiety   . Depression   . Oppositional defiant behavior   . Schizo affective schizophrenia (HCC)   . Schizophrenia (HCC)    History reviewed. No pertinent surgical history. Family History:  Family History  Problem Relation Age of Onset  . Heart failure Mother   . Hypertension Mother   . Diabetes Father    Family Psychiatric  History: See admission H&P Social History:  Social History   Substance and Sexual Activity  Alcohol Use No     Social History   Substance and Sexual Activity  Drug Use No    Social History   Socioeconomic History  . Marital status: Single    Spouse name: Not on file  . Number of children: Not on file  . Years of education: Not on file  . Highest education level: Not on file  Occupational History  . Occupation: Unemployed  Tobacco Use  . Smoking status: Never Smoker  . Smokeless tobacco: Never Used  Vaping Use  . Vaping Use: Never used  Substance and Sexual Activity  . Alcohol use: No  . Drug use: No  . Sexual activity: Never    Birth control/protection: None  Other Topics Concern  . Not on file  Social History Narrative   ** Merged History Encounter **  Pt lives with her mother, brother, and father.  She [facility]   Social Determinants of Health   Financial Resource Strain: Not on file  Food Insecurity: Not on file  Transportation Needs: Not on file  Physical Activity: Not on file  Stress: Not on file  Social Connections: Not on file   Additional Social History:    Pain Medications: see MAR Prescriptions: see MAR Over the Counter: see MAR History of alcohol / drug use?: No history of alcohol / drug abuse Longest period of  sobriety (when/how long): none Negative Consequences of Use: Financial Withdrawal Symptoms: Other (Comment) (none)                    Sleep: Fair  Appetite:  Fair  Current Medications: Current Facility-Administered Medications  Medication Dose Route Frequency Provider Last Rate Last Admin  . acetaminophen (TYLENOL) tablet 650 mg  650 mg Oral Q6H PRN Antonieta Pertlary, Forest Pruden Lawson, MD      . alum & mag hydroxide-simeth (MAALOX/MYLANTA) 200-200-20 MG/5ML suspension 30 mL  30 mL Oral Q4H PRN Antonieta Pertlary, Elowyn Raupp Lawson, MD      . haloperidol (HALDOL) tablet 15 mg  15 mg Oral QHS Antonieta Pertlary, Tyjuan Demetro Lawson, MD       Or  . haloperidol lactate (HALDOL) injection 15 mg  15 mg Intramuscular QHS Antonieta Pertlary, Evamarie Raetz Lawson, MD      . Melene Muller[START ON 09/18/2020] haloperidol (HALDOL) tablet 5 mg  5 mg Oral Daily Antonieta Pertlary, Sebastiana Wuest Lawson, MD       Or  . Melene Muller[START ON 09/18/2020] haloperidol lactate (HALDOL) injection 5 mg  5 mg Intramuscular Daily Antonieta Pertlary, Teyana Pierron Lawson, MD      . LORazepam (ATIVAN) tablet 2 mg  2 mg Oral BID Antonieta Pertlary, Shalanda Brogden Lawson, MD   2 mg at 09/17/20 0825   Or  . LORazepam (ATIVAN) injection 2 mg  2 mg Intramuscular BID Antonieta Pertlary, Joby Richart Lawson, MD      . ziprasidone (GEODON) injection 20 mg  20 mg Intramuscular Q12H PRN Antonieta Pertlary, Cindy Fullman Lawson, MD       And  . LORazepam (ATIVAN) tablet 1 mg  1 mg Oral PRN Antonieta Pertlary, Honest Vanleer Lawson, MD      . magnesium hydroxide (MILK OF MAGNESIA) suspension 30 mL  30 mL Oral Daily PRN Antonieta Pertlary, Cianni Manny Lawson, MD      . paliperidone (INVEGA SUSTENNA) injection 156 mg  156 mg Intramuscular Once Antonieta Pertlary, Birdella Sippel Lawson, MD      . paliperidone (INVEGA) 24 hr tablet 9 mg  9 mg Oral QHS Antonieta Pertlary, Marlayna Bannister Lawson, MD   9 mg at 09/16/20 2158  . traZODone (DESYREL) tablet 150 mg  150 mg Oral QHS Antonieta Pertlary, Eero Dini Lawson, MD   150 mg at 09/13/20 2051    Lab Results:  Results for orders placed or performed during the hospital encounter of 09/13/20 (from the past 48 hour(s))  Basic metabolic panel     Status: Abnormal   Collection Time:  09/16/20  6:00 PM  Result Value Ref Range   Sodium 139 135 - 145 mmol/L   Potassium 3.7 3.5 - 5.1 mmol/L   Chloride 102 98 - 111 mmol/L   CO2 26 22 - 32 mmol/L   Glucose, Bld 126 (H) 70 - 99 mg/dL    Comment: Glucose reference range applies only to samples taken after fasting for at least 8 hours.   BUN 24 (H) 6 - 20 mg/dL   Creatinine, Ser 3.081.08 (H) 0.44 -  1.00 mg/dL   Calcium 9.2 8.9 - 69.6 mg/dL   GFR, Estimated >78 >93 mL/min    Comment: (NOTE) Calculated using the CKD-EPI Creatinine Equation (2021)    Anion gap 11 5 - 15    Comment: Performed at [facility], [address] [place], [place] [address]    Blood Alcohol level:  Lab Results  Component Value Date   [facility] <10 09/09/2020   ETH <10 01/04/2020    Metabolic Disorder Labs: Lab Results  Component Value Date   HGBA1C 5.6 12/27/2019   MPG 114.02 12/27/2019   MPG 105.41 12/04/2019   Lab Results  Component Value Date   PROLACTIN 57.7 (H) 12/04/2019   PROLACTIN 75.7 (H) 10/23/2019   Lab Results  Component Value Date   CHOL 145 12/27/2019   TRIG 59 12/27/2019   HDL 24 (L) 12/27/2019   CHOLHDL 6.0 12/27/2019   VLDL 12 12/27/2019   LDLCALC 109 (H) 12/27/2019   LDLCALC 100 (H) 12/04/2019    Physical Findings: AIMS: Facial and Oral Movements Muscles of Facial Expression: None, normal Lips and Perioral Area: None, normal Jaw: None, normal Tongue: None, normal,Extremity Movements Upper (arms, wrists, hands, fingers): None, normal Lower (legs, knees, ankles, toes): None, normal, Trunk Movements Neck, shoulders, hips: None, normal, Overall Severity Severity of abnormal movements (highest score from questions above): None, normal Incapacitation due to abnormal movements: None, normal Patient's awareness of abnormal movements (rate only patient's report): No Awareness, Dental Status Current problems with teeth and/or dentures?: No Does patient usually wear dentures?: No  CIWA:  CIWA-Ar Total:  2 COWS:  COWS Total Score: 4  Musculoskeletal: Strength & Muscle Tone: within normal limits Gait & Station: normal Patient leans: N/A  Psychiatric Specialty Exam: Physical Exam Vitals and nursing note reviewed.  Constitutional:      Appearance: She is obese.  HENT:     Head: Normocephalic and atraumatic.  Pulmonary:     Effort: Pulmonary effort is normal.  Neurological:     General: No focal deficit present.     Mental Status: She is alert and oriented to person, place, and time.     Review of Systems  Blood pressure 133/66, pulse (!) 135, temperature 98.3 F (36.8 C), temperature source Oral, resp. rate 16, height 5\' 4"  (1.626 m), weight 121.6 kg, last menstrual period 09/13/2020, SpO2 99 %.Body mass index is 46 kg/m.  General Appearance: Disheveled  Eye Contact:  Fair  Speech:  Normal Rate  Volume:  Decreased  Mood:  Anxious and Dysphoric  Affect:  Flat  Thought Process:  Goal Directed and Descriptions of Associations: Loose  Orientation:  Negative  Thought Content:  Illogical, Delusions, Hallucinations: Auditory, Paranoid Ideation and Rumination  Suicidal Thoughts:  No  Homicidal Thoughts:  No  Memory:  Immediate;   Poor Recent;   Poor Remote;   Poor  Judgement:  Impaired  Insight:  Lacking  Psychomotor Activity:  Decreased  Concentration:  Concentration: Fair and Attention Span: Fair  Recall:  09/15/2020 of Knowledge:  Fair  Language:  Fair  Akathisia:  Negative  Handed:  Right  AIMS (if indicated):     Assets:  Desire for Improvement Housing Resilience Social Support  ADL's:  Impaired  Cognition:  WNL  Sleep:  Number of Hours: 6.75     Treatment Plan Summary: Daily contact with patient to assess and evaluate symptoms and progress in treatment, Medication management and Plan : Patient is seen and examined.  Patient is  a 24 year old female with the above-stated past psychiatric history who is seen in follow-up.   Diagnosis: 1. Schizophrenia;  catatonic type 2. Poor p.o. intake 3.  Hypertension 4.  Tachycardia  Pertinent findings on examination today: 1.  Patient remained catatonic early this a.m., but later in the day with increase in medication had some degree of improvement.  Plan: 1.  Forced medication protocol still in place. 2.  Continue Haldol 5 mg p.o. or IM daily and 15 mg p.o. nightly to relieve catatonia and psychosis. 3.  Continue paliperidone 9 mg p.o. nightly for psychosis. 4.  Give paliperidone 156 mg IM x1 additional dosage given severity of illness.  This is for psychosis. 5.  Continue lorazepam 2 mg p.o. or IM twice daily for catatonia. 6.  Force fluids. 7.  Continue trazodone 150 mg p.o. nightly for insomnia. 8.  Laboratories reveal normalization of electrolytes. 9.  Disposition planning-in progress.   Antonieta Pert, MD 09/17/2020, 2:00 PM

## 2020-09-18 MED ORDER — TRAZODONE HCL 100 MG PO TABS
100.0000 mg | ORAL_TABLET | Freq: Every day | ORAL | Status: DC
  Administered 2020-09-18 – 2020-09-23 (×5): 100 mg via ORAL
  Filled 2020-09-18 (×9): qty 1

## 2020-09-18 NOTE — Progress Notes (Addendum)
EKG results placed on the outside of shadow chart.  Sinus Tachycardia Abnormal EKG  QT/QTc=372/494 ms

## 2020-09-18 NOTE — Progress Notes (Signed)
   09/18/20 2115  COVID-19 Daily Checkoff  Have you had a fever (temp > 37.80C/100F)  in the past 24 hours?  No  If you have had runny nose, nasal congestion, sneezing in the past 24 hours, has it worsened? No  COVID-19 EXPOSURE  Have you traveled outside the state in the past 14 days? No  Have you been in contact with someone with a confirmed diagnosis of COVID-19 or PUI in the past 14 days without wearing appropriate PPE? No  Have you been living in the same home as a person with confirmed diagnosis of COVID-19 or a PUI (household contact)? No  Have you been diagnosed with COVID-19? No

## 2020-09-18 NOTE — [facility] Group Notes (Signed)
 .  Psychoeducational Group Note  Date: 09-18-20 Time: 0900-1000    Goal Setting   Purpose of Group: This group helps to provide patients with the steps of setting a goal that is specific, measurable, attainable, realistic and time specific. A discussion on how we keep ourselves stuck with negative self talk.    Participation Level:  Did not attend  Mace Weinberg A 

## 2020-09-18 NOTE — Progress Notes (Addendum)
   09/18/20 1000  Psych Admission Type (Psych Patients Only)  Admission Status Involuntary  Psychosocial Assessment  Patient Complaints Suspiciousness  Eye Contact Avoids;Brief  Facial Expression Blank;Flat  Affect Flat  Speech Logical/coherent;Slow;Elective mutism  Interaction Avoidant;Cautious  Motor Activity Pacing;Slow  Appearance/Hygiene Body odor;Disheveled;Poor hygiene;In scrubs  Behavior Characteristics Cooperative;Pacing  Mood Pleasant;Sad;Preoccupied  Thought Process  Coherency Concrete thinking  Content Paranoia  Delusions Paranoid  Perception WDL  Hallucination None reported or observed  Judgment Limited  Confusion None  Danger to Self  Current suicidal ideation? Denies  Danger to Others  Danger to Others None reported or observed   Pt showered this evening and changed into gown.

## 2020-09-18 NOTE — Progress Notes (Signed)
   09/18/20 2115  Psychosocial Assessment  Patient Complaints None  Eye Contact Brief  Facial Expression Blank;Flat  Affect Flat  Speech Logical/coherent;Slow  Interaction Avoidant;Cautious  Motor Activity Slow  Appearance/Hygiene Improved;In hospital gown  Behavior Characteristics Appropriate to situation;Cooperative  Thought Administrator, sports thinking  Content Paranoia  Delusions Paranoid  Perception WDL  Hallucination None reported or observed  Judgment Limited  Confusion None  Danger to Self  Current suicidal ideation? Denies  Danger to Others  Danger to Others None reported or observed  Danger to Others Abnormal  Harmful Behavior to others No threats or harm toward other people  Destructive Behavior No threats or harm toward property

## 2020-09-18 NOTE — Progress Notes (Signed)
[facility] MD Progress Note  09/18/2020 11:07 AM Lindsey Richmond  MRN:  [id number] Subjective:  Patient is a 24 year old female with a past psychiatric history significant for schizophrenia who originally presented to the [facility] emergency department on 09/09/2020. She was brought there by EMS. The patient had apparently been off her psychiatric medications for an unknown specified amount of time. She was essentially nonverbal. She apparently was very agitated in the emergency department, and attempted to leave the emergency department. She was placed under involuntary commitment by the police.  Objective: Patient is seen and examined.  Patient is a 24 year old female with the above-stated past psychiatric history who is seen in follow-up.  She is awake this morning when I arrived on the unit.  She is up walking around.  Her speech rate is still decreased and her movements are quite slow.  She denied any auditory hallucinations this morning.  Her hygiene continues to be poor.  We discussed the need for bathing.  Initially her blood pressure was 141/95, repeat was 133/66.  Pulse was between 110 and 135.  She is afebrile.  She slept over 6 hours.  EKG was ordered yesterday to assess her tachycardia, but apparently was not done.  That will be reordered.  She did receive the long-acting paliperidone injection 156 mg additional dose on 09/17/2020.  She does remain semicatatonic and delusional.  Principal Problem: <principal problem not specified> Diagnosis: Active Problems:   Paranoid schizophrenia (HCC)  Total Time spent with patient: 20 minutes  Past Psychiatric History: See admission H&P  Past Medical History:  Past Medical History:  Diagnosis Date   Anxiety    Depression    Oppositional defiant behavior    Schizo affective schizophrenia (HCC)    Schizophrenia (HCC)    History reviewed. No pertinent surgical history. Family History:  Family History  Problem Relation Age  of Onset   Heart failure Mother    Hypertension Mother    Diabetes Father    Family Psychiatric  History: See admission H&P Social History:  Social History   Substance and Sexual Activity  Alcohol Use No     Social History   Substance and Sexual Activity  Drug Use No    Social History   Socioeconomic History   Marital status: Single    Spouse name: Not on file   Number of children: Not on file   Years of education: Not on file   Highest education level: Not on file  Occupational History   Occupation: Unemployed  Tobacco Use   Smoking status: Never Smoker   Smokeless tobacco: Never Used  Building services engineer Use: Never used  Substance and Sexual Activity   Alcohol use: No   Drug use: No   Sexual activity: Never    Birth control/protection: None  Other Topics Concern   Not on file  Social History Narrative   ** Merged History Encounter **       Pt lives with her mother, brother, and father.  She receives outpatient psychiatric care through [facility].   Social Determinants of Health   Financial Resource Strain: Not on file  Food Insecurity: Not on file  Transportation Needs: Not on file  Physical Activity: Not on file  Stress: Not on file  Social Connections: Not on file   Additional Social History:    Pain Medications: see MAR Prescriptions: see MAR Over the Counter: see MAR History of alcohol / drug use?: No history of alcohol / drug  abuse Longest period of sobriety (when/how long): none Negative Consequences of Use: Financial Withdrawal Symptoms: Other (Comment) (none)                    Sleep: Good  Appetite:  Fair  Current Medications: Current Facility-Administered Medications  Medication Dose Route Frequency Provider Last Rate Last Admin   acetaminophen (TYLENOL) tablet 650 mg  650 mg Oral Q6H PRN Antonieta Pertlary, Olsen Mccutchan Lawson, MD       alum & mag hydroxide-simeth (MAALOX/MYLANTA) 200-200-20 MG/5ML suspension 30 mL  30 mL Oral Q4H  PRN Antonieta Pertlary, Samer Dutton Lawson, MD       haloperidol (HALDOL) tablet 15 mg  15 mg Oral QHS Antonieta Pertlary, Saahir Prude Lawson, MD       Or   haloperidol lactate (HALDOL) injection 15 mg  15 mg Intramuscular QHS Antonieta Pertlary, Yeimy Brabant Lawson, MD       haloperidol (HALDOL) tablet 5 mg  5 mg Oral Daily Antonieta Pertlary, Ruchi Stoney Lawson, MD   5 mg at 09/18/20 [id number]   Or   haloperidol lactate (HALDOL) injection 5 mg  5 mg Intramuscular Daily Antonieta Pertlary, Brilee Port Lawson, MD       LORazepam (ATIVAN) tablet 2 mg  2 mg Oral BID Antonieta Pertlary, Asli Tokarski Lawson, MD   2 mg at 09/18/20 [id number]   Or   LORazepam (ATIVAN) injection 2 mg  2 mg Intramuscular BID Antonieta Pertlary, Navraj Dreibelbis Lawson, MD       ziprasidone (GEODON) injection 20 mg  20 mg Intramuscular Q12H PRN Antonieta Pertlary, Itzy Adler Lawson, MD       And   LORazepam (ATIVAN) tablet 1 mg  1 mg Oral PRN Antonieta Pertlary, Lyza Houseworth Lawson, MD       magnesium hydroxide (MILK OF MAGNESIA) suspension 30 mL  30 mL Oral Daily PRN Antonieta Pertlary, Nieko Clarin Lawson, MD       paliperidone (INVEGA) 24 hr tablet 9 mg  9 mg Oral QHS Antonieta Pertlary, Nevaeh Korte Lawson, MD   9 mg at 09/16/20 2158   traZODone (DESYREL) tablet 150 mg  150 mg Oral QHS Antonieta Pertlary, Kyser Wandel Lawson, MD   150 mg at 09/13/20 2051    Lab Results:  Results for orders placed or performed during the hospital encounter of 09/13/20 (from the past 48 hour(s))  Basic metabolic panel     Status: Abnormal   Collection Time: 09/16/20  6:00 PM  Result Value Ref Range   Sodium 139 135 - 145 mmol/L   Potassium 3.7 3.5 - 5.1 mmol/L   Chloride 102 98 - 111 mmol/L   CO2 26 22 - 32 mmol/L   Glucose, Bld 126 (H) 70 - 99 mg/dL    Comment: Glucose reference range applies only to samples taken after fasting for at least 8 hours.   BUN 24 (H) 6 - 20 mg/dL   Creatinine, Ser 7.821.08 (H) 0.44 - 1.00 mg/dL   Calcium 9.2 8.9 - 95.610.3 mg/dL   GFR, Estimated >21>60 >30>60 mL/min    Comment: (NOTE) Calculated using the CKD-EPI Creatinine Equation (2021)    Anion gap 11 5 - 15    Comment: Performed at [facility], [address]  [place], [place] [id number]    Blood Alcohol level:  Lab Results  Component Value Date   [facility]   ETH <10 01/04/2020    Metabolic Disorder Labs: Lab Results  Component Value Date   HGBA1C 5.6 12/27/2019   MPG 114.02 12/27/2019   MPG 105.41 12/04/2019   Lab Results  Component Value Date  PROLACTIN 57.7 (H) 12/04/2019   PROLACTIN 75.7 (H) 10/23/2019   Lab Results  Component Value Date   CHOL 145 12/27/2019   TRIG 59 12/27/2019   HDL 24 (L) 12/27/2019   CHOLHDL 6.0 12/27/2019   VLDL 12 12/27/2019   LDLCALC 109 (H) 12/27/2019   LDLCALC 100 (H) 12/04/2019    Physical Findings: AIMS: Facial and Oral Movements Muscles of Facial Expression: None, normal Lips and Perioral Area: None, normal Jaw: None, normal Tongue: None, normal,Extremity Movements Upper (arms, wrists, hands, fingers): None, normal Lower (legs, knees, ankles, toes): None, normal, Trunk Movements Neck, shoulders, hips: None, normal, Overall Severity Severity of abnormal movements (highest score from questions above): None, normal Incapacitation due to abnormal movements: None, normal Patient's awareness of abnormal movements (rate only patient's report): No Awareness, Dental Status Current problems with teeth and/or dentures?: No Does patient usually wear dentures?: No  CIWA:  CIWA-Ar Total: 2 COWS:  COWS Total Score: 4  Musculoskeletal: Strength & Muscle Tone: decreased Gait & Station: shuffle Patient leans: N/A  Psychiatric Specialty Exam: Physical Exam Vitals and nursing note reviewed.  Constitutional:      Appearance: She is obese.  HENT:     Head: Normocephalic and atraumatic.  Pulmonary:     Effort: Pulmonary effort is normal.  Neurological:     General: No focal deficit present.     Mental Status: She is alert and oriented to person, place, and time.     Review of Systems  Blood pressure 133/66, pulse (!) 135, temperature 98.3 F (36.8 C), temperature source Oral, resp. rate  16, height 5\' 4"  (1.626 m), weight 121.6 kg, last menstrual period 09/13/2020, SpO2 99 %.Body mass index is 46 kg/m.  General Appearance: Disheveled  Eye Contact:  Fair  Speech:  Normal Rate  Volume:  Decreased  Mood:  Dysphoric  Affect:  Flat  Thought Process:  Goal Directed and Descriptions of Associations: Loose  Orientation:  Negative  Thought Content:  Delusions, Hallucinations: Auditory and Paranoid Ideation  Suicidal Thoughts:  No  Homicidal Thoughts:  No  Memory:  Immediate;   Poor Recent;   Poor Remote;   Poor  Judgement:  Impaired  Insight:  Fair  Psychomotor Activity:  Decreased  Concentration:  Concentration: Fair and Attention Span: Fair  Recall:  Good  Fund of Knowledge:  Fair  Language:  Fair  Akathisia:  Negative  Handed:  Right  AIMS (if indicated):     Assets:  Desire for Improvement Resilience  ADL's:  Impaired  Cognition:  WNL  Sleep:  Number of Hours: 9.25     Treatment Plan Summary: Daily contact with patient to assess and evaluate symptoms and progress in treatment, Medication management and Plan : Patient is seen and examined.  Patient is a 24 year old female with the above-stated past psychiatric history who is seen in follow-up.   Diagnosis: 1. Schizophrenia; catatonic type 2. Poor p.o. intake 3.  Hypertension 4.  Tachycardia  Pertinent findings on examination today: 1.  Patient's catatonia seems somewhat decreased.  She is ambulating, taking p.o.'s, and has had some degree of improvement. 2.  Sleep is significantly increased. 3.  Tachycardia continues  Plan: 1.  Continue forced med protocol. 2.  Continue haloperidol to 5 mg p.o. or IM daily and decrease nighttime doses to 15 mg p.o. nightly.  This is to relieve catatonia and psychosis. 4.  Give paliperidone 156 mg IM x1 additional dosage given severity of illness.  This is for psychosis. 5.  Continue lorazepam 2 mg p.o. or IM twice daily for catatonia. 6.  Force fluids. 7.  Decrease  trazodone to 100 mg p.o. nightly for insomnia. 8.  Reorder EKG secondary to tachycardia. 9.  Continue paliperidone 9 mg p.o. nightly for psychosis. 10.  Disposition planning-in progress.  Antonieta Pert, MD 09/18/2020, 11:07 AM

## 2020-09-18 NOTE — [facility] Group Notes (Signed)
°  [facility] LCSW Group Therapy Note  Date/Time:  09/18/2020 11:15AM-12:00PM  Type of Therapy and Topic:  Group Therapy:  Feelings About Hospitalization  Participation Level:  Active   Description of Group This process group involved patients discussing their feelings related to being hospitalized, as well as the benefits they see to being in the hospital.  These feelings and benefits were itemized.  The group then brainstormed specific ways in which they could seek those same benefits when they discharge and return home.  Therapeutic Goals 1. Patient will identify and describe positive and negative feelings related to hospitalization 2. Patient will verbalize benefits of hospitalization to themselves personally 3. Patients will brainstorm together ways they can obtain similar benefits in the outpatient setting, identify barriers to wellness and possible solutions  Summary of Patient Progress:  The patient expressed her primary feelings about being hospitalized are that she does not like being in the hospital, but when she does "weird" things her parents send her to the hospital.  She complained about being put into restraints before coming to this hospital, stating that she was trying to go outside when they put restraints on her.  She does not like the noise in the hospital and does not like the conversations with a lot of cursing that she sometimes hears, which is an affront to her as a true believer.  She stated that in order to stay out of the hospital, she needs to "find somewhere else to pray."  Therapeutic Modalities Cognitive Behavioral Therapy Motivational Interviewing    Ambrose Mantle, LCSW 09/18/2020, 3:14 PM

## 2020-09-19 MED ORDER — HALOPERIDOL 5 MG PO TABS
10.0000 mg | ORAL_TABLET | Freq: Every day | ORAL | Status: DC
  Administered 2020-09-19: 21:00:00 10 mg via ORAL
  Filled 2020-09-19 (×2): qty 2

## 2020-09-19 MED ORDER — HALOPERIDOL LACTATE 5 MG/ML IJ SOLN
10.0000 mg | Freq: Every day | INTRAMUSCULAR | Status: DC
  Filled 2020-09-19: qty 2

## 2020-09-19 MED ORDER — HALOPERIDOL 5 MG PO TABS
10.0000 mg | ORAL_TABLET | Freq: Every day | ORAL | Status: DC
  Filled 2020-09-19: qty 2

## 2020-09-19 MED ORDER — HALOPERIDOL LACTATE 5 MG/ML IJ SOLN
10.0000 mg | Freq: Every day | INTRAMUSCULAR | Status: DC
  Filled 2020-09-19 (×2): qty 2

## 2020-09-19 MED ORDER — HALOPERIDOL LACTATE 5 MG/ML IJ SOLN
5.0000 mg | Freq: Every day | INTRAMUSCULAR | Status: DC
  Filled 2020-09-19 (×2): qty 1

## 2020-09-19 MED ORDER — HALOPERIDOL 5 MG PO TABS
5.0000 mg | ORAL_TABLET | Freq: Every day | ORAL | Status: DC
  Administered 2020-09-20: 08:00:00 5 mg via ORAL
  Filled 2020-09-19 (×2): qty 1

## 2020-09-19 NOTE — Progress Notes (Signed)
Patient has been in her room resting this shift. She did come and pick out her snacks and took her medications. She had little to say but would smile when writer joked with her about certain things.  Support given and safety maintained with 15 min checks.

## 2020-09-19 NOTE — [facility] Group Notes (Signed)
[facility] Group Notes: (Clinical Social Work)   09/19/2020      Type of Therapy:  Group Therapy   Participation Level:  Did Not Attend - was invited both individually by [facility] and by overhead announcement, chose not to attend.   Philip Eckersley Grossman-Orr, LCSW 09/19/2020, 2:37 PM     

## 2020-09-19 NOTE — [facility] Group Notes (Signed)
Adult Psychoeducational Group Not Date:  1219/2021 Time:  7681-1572 Group Topic/Focus: PROGRESSIVE RELAXATION. A group where deep breathing is taught and tensing and relaxation muscle groups is used. Imagery is used as well.  Pts are asked to imagine 3 pillars that hold them up when they are not able to hold themselves up.  Participation Level:  Pt sat in the room for a brief period of time and then decided to leave. Did say at the beginning of the group that she is here becaluse her family does not like her being religious.   Lindsey Richmond A 12/19//2021`

## 2020-09-19 NOTE — Progress Notes (Signed)
[facility] MD Progress Note  09/19/2020 11:31 AM Lindsey Richmond  MRN:  [id number] Subjective:  Patient is a 24 year old female with a past psychiatric history significant for schizophrenia who originally presented to the [facility] emergency department on 09/09/2020. She was brought there by EMS. The patient had apparently been off her psychiatric medications for an unknown specified amount of time. She was essentially nonverbal. She apparently was very agitated in the emergency department, and attempted to leave the emergency department. She was placed under involuntary commitment by the police.  Objective: Patient is seen and examined.  Patient is a 24 year old female with the above-stated past psychiatric history who is seen in follow-up.  She continues to slowly improve.  She is awake this morning.  She is more conversant.  She is able to smile and engage.  Occasionally the smiles appear to be inappropriate.  She stated the auditory hallucinations had decreased.  She denied any visual hallucinations.  She denied any suicidal or homicidal ideation.  It does appear that she got a shower yesterday.  Her vital signs are stable, she is afebrile.  Pulse oximetry was 98% on room air.  EKG showed a sinus tachycardia with a QTc interval of approximately 480.  She slept 8.25 hours last night.  Principal Problem: <principal problem not specified> Diagnosis: Active Problems:   Paranoid schizophrenia (HCC)  Total Time spent with patient: 20 minutes  Past Psychiatric History: See admission H&P  Past Medical History:  Past Medical History:  Diagnosis Date  . Anxiety   . Depression   . Oppositional defiant behavior   . Schizo affective schizophrenia (HCC)   . Schizophrenia (HCC)    History reviewed. No pertinent surgical history. Family History:  Family History  Problem Relation Age of Onset  . Heart failure Mother   . Hypertension Mother   . Diabetes Father    Family Psychiatric   History: See admission H&P Social History:  Social History   Substance and Sexual Activity  Alcohol Use No     Social History   Substance and Sexual Activity  Drug Use No    Social History   Socioeconomic History  . Marital status: Single    Spouse name: Not on file  . Number of children: Not on file  . Years of education: Not on file  . Highest education level: Not on file  Occupational History  . Occupation: Unemployed  Tobacco Use  . Smoking status: Never Smoker  . Smokeless tobacco: Never Used  Vaping Use  . Vaping Use: Never used  Substance and Sexual Activity  . Alcohol use: No  . Drug use: No  . Sexual activity: Never    Birth control/protection: None  Other Topics Concern  . Not on file  Social History Narrative   ** Merged History Encounter **       Pt lives with her mother, brother, and father.  She receives outpatient psychiatric care through [facility].   Social Determinants of Health   Financial Resource Strain: Not on file  Food Insecurity: Not on file  Transportation Needs: Not on file  Physical Activity: Not on file  Stress: Not on file  Social Connections: Not on file   Additional Social History:    Pain Medications: see MAR Prescriptions: see MAR Over the Counter: see MAR History of alcohol / drug use?: No history of alcohol / drug abuse Longest period of sobriety (when/how long): none Negative Consequences of Use: Financial Withdrawal Symptoms: Other (Comment) (none)  Sleep: Good  Appetite:  Fair  Current Medications: Current Facility-Administered Medications  Medication Dose Route Frequency Provider Last Rate Last Admin  . acetaminophen (TYLENOL) tablet 650 mg  650 mg Oral Q6H PRN Antonieta Pert, MD      . alum & mag hydroxide-simeth (MAALOX/MYLANTA) 200-200-20 MG/5ML suspension 30 mL  30 mL Oral Q4H PRN Antonieta Pert, MD      . Melene Muller ON 09/20/2020] haloperidol (HALDOL) tablet 10 mg  10 mg Oral  Daily Antonieta Pert, MD       Or  . Melene Muller ON 09/20/2020] haloperidol lactate (HALDOL) injection 10 mg  10 mg Intramuscular Daily Antonieta Pert, MD      . haloperidol (HALDOL) tablet 15 mg  15 mg Oral QHS Antonieta Pert, MD   15 mg at 09/18/20 2111   Or  . haloperidol lactate (HALDOL) injection 15 mg  15 mg Intramuscular QHS Antonieta Pert, MD      . LORazepam (ATIVAN) tablet 2 mg  2 mg Oral BID Antonieta Pert, MD   2 mg at 09/19/20 0755   Or  . LORazepam (ATIVAN) injection 2 mg  2 mg Intramuscular BID Antonieta Pert, MD      . ziprasidone (GEODON) injection 20 mg  20 mg Intramuscular Q12H PRN Antonieta Pert, MD       And  . LORazepam (ATIVAN) tablet 1 mg  1 mg Oral PRN Antonieta Pert, MD      . magnesium hydroxide (MILK OF MAGNESIA) suspension 30 mL  30 mL Oral Daily PRN Antonieta Pert, MD      . paliperidone (INVEGA) 24 hr tablet 9 mg  9 mg Oral QHS Antonieta Pert, MD   9 mg at 09/18/20 2111  . traZODone (DESYREL) tablet 100 mg  100 mg Oral QHS Antonieta Pert, MD   100 mg at 09/18/20 2112    Lab Results: No results found for this or any previous visit (from the past 48 hour(s)).  Blood Alcohol level:  Lab Results  Component Value Date   ETH <10 09/09/2020   ETH <10 01/04/2020    Metabolic Disorder Labs: Lab Results  Component Value Date   HGBA1C 5.6 12/27/2019   MPG 114.02 12/27/2019   MPG 105.41 12/04/2019   Lab Results  Component Value Date   PROLACTIN 57.7 (H) 12/04/2019   PROLACTIN 75.7 (H) 10/23/2019   Lab Results  Component Value Date   CHOL 145 12/27/2019   TRIG 59 12/27/2019   HDL 24 (L) 12/27/2019   CHOLHDL 6.0 12/27/2019   VLDL 12 12/27/2019   LDLCALC 109 (H) 12/27/2019   LDLCALC 100 (H) 12/04/2019    Physical Findings: AIMS: Facial and Oral Movements Muscles of Facial Expression: None, normal Lips and Perioral Area: None, normal Jaw: None, normal Tongue: None, normal,Extremity Movements Upper (arms,  wrists, hands, fingers): None, normal Lower (legs, knees, ankles, toes): None, normal, Trunk Movements Neck, shoulders, hips: None, normal, Overall Severity Severity of abnormal movements (highest score from questions above): None, normal Incapacitation due to abnormal movements: None, normal Patient's awareness of abnormal movements (rate only patient's report): No Awareness, Dental Status Current problems with teeth and/or dentures?: No Does patient usually wear dentures?: No  CIWA:  CIWA-Ar Total: 2 COWS:  COWS Total Score: 4  Musculoskeletal: Strength & Muscle Tone: within normal limits Gait & Station: normal Patient leans: N/A  Psychiatric Specialty Exam: Physical Exam Vitals and nursing note reviewed.  Constitutional:  Appearance: Normal appearance.  HENT:     Head: Normocephalic and atraumatic.  Pulmonary:     Effort: Pulmonary effort is normal.  Neurological:     General: No focal deficit present.     Mental Status: She is alert.     Review of Systems  Blood pressure 138/76, pulse (!) 126, temperature 98.1 F (36.7 C), temperature source Oral, resp. rate 16, height 5\' 4"  (1.626 m), weight 121.6 kg, last menstrual period 09/13/2020, SpO2 97 %.Body mass index is 46 kg/m.  General Appearance: Disheveled  Eye Contact:  Fair  Speech:  Normal Rate  Volume:  Decreased  Mood:  Euthymic  Affect:  Flat  Thought Process:  Coherent and Descriptions of Associations: Loose  Orientation:  Full (Time, Place, and Person)  Thought Content:  Delusions, Hallucinations: Auditory and Paranoid Ideation  Suicidal Thoughts:  No  Homicidal Thoughts:  No  Memory:  Immediate;   Poor Recent;   Poor Remote;   Poor  Judgement:  Impaired  Insight:  Fair  Psychomotor Activity:  Psychomotor Retardation  Concentration:  Concentration: Fair and Attention Span: Fair  Recall:  09/15/2020 of Knowledge:  Fair  Language:  Fair  Akathisia:  Negative  Handed:  Right  AIMS (if indicated):      Assets:  Desire for Improvement Housing Resilience Social Support  ADL's:  Impaired  Cognition:  WNL  Sleep:  Number of Hours: 8.25     Treatment Plan Summary: Daily contact with patient to assess and evaluate symptoms and progress in treatment, Medication management and Plan : Patient is seen and examined.  Patient is a 24 year old female with the above-stated past psychiatric history who is seen in follow-up.   Diagnosis: 1. Schizophrenia; catatonic type 2. Poor p.o. intake 3. Hypertension 4. Tachycardia  Pertinent findings on examination today: 1.  Catatonia continues to slowly improve.  She is ambulating, taking p.o.'s. 2.  She still is significantly sedated. 3.  Tachycardia stable, QTc interval is approximately 480.  Plan: 1.  We will continue the forced medication protocol as necessary but reduce the dosage of Haldol. 2.  Decrease haloperidol back to 5 mg p.o. daily and 10 mg p.o. nightly.  This is for psychosis, and dosage is being reduced because of increased QTc interval.  Additionally the long-acting paliperidone may be beneficial at this point. 3.  Continue lorazepam 2 mg p.o. or IM twice daily for catatonia. 4.  Continue paliperidone 9 mg p.o. nightly for psychosis. 5.  Continue trazodone 100 mg p.o. nightly as needed insomnia. 6.  Patient received paliperidone long-acting injectable medication 156 mg IM 1 to 2 days prior to admission, and she received an additional 156 mg on 09/17/2020.  This was for psychosis. 7.  Disposition planning-in progress.  09/19/2020, MD 09/19/2020, 11:31 AM

## 2020-09-19 NOTE — Progress Notes (Signed)
   09/19/20 1100  Psych Admission Type (Psych Patients Only)  Admission Status Involuntary  Psychosocial Assessment  Patient Complaints None  Eye Contact Brief  Facial Expression Blank;Flat  Affect Flat  Speech Logical/coherent;Slow  Interaction Avoidant;Cautious  Motor Activity Slow  Appearance/Hygiene Improved;In hospital gown  Behavior Characteristics Cooperative;Appropriate to situation  Mood Preoccupied;Pleasant  Thought Process  Coherency Concrete thinking  Content Religiosity  Delusions None reported or observed  Perception WDL  Hallucination None reported or observed  Judgment Limited  Confusion None  Danger to Self  Current suicidal ideation? Denies  Danger to Others  Danger to Others None reported or observed  Danger to Others Abnormal  Harmful Behavior to others No threats or harm toward other people  Destructive Behavior No threats or harm toward property

## 2020-09-19 NOTE — Progress Notes (Signed)
[place] NOVEL CORONAVIRUS (COVID-19) DAILY CHECK-OFF SYMPTOMS - answer yes or no to each - every day NO YES  Have you had a fever in the past 24 hours?  . Fever (Temp > 37.80C / 100F) X   Have you had any of these symptoms in the past 24 hours? . New Cough .  Sore Throat  .  Shortness of Breath .  Difficulty Breathing .  Unexplained Body Aches   X   Have you had any one of these symptoms in the past 24 hours not related to allergies?   . Runny Nose .  Nasal Congestion .  Sneezing   X   If you have had runny nose, nasal congestion, sneezing in the past 24 hours, has it worsened?  X   EXPOSURES - check yes or no X   Have you traveled outside the state in the past 14 days?  X   Have you been in contact with someone with a confirmed diagnosis of COVID-19 or PUI in the past 14 days without wearing appropriate PPE?  X   Have you been living in the same home as a person with confirmed diagnosis of COVID-19 or a PUI (household contact)?    X   Have you been diagnosed with COVID-19?    X              What to do next: Answered NO to all: Answered YES to anything:   Proceed with unit schedule Follow the [facility] Inpatient Flowsheet.   

## 2020-09-19 NOTE — Progress Notes (Signed)
   09/19/20 2045  COVID-19 Daily Checkoff  Have you had a fever (temp > 37.80C/100F)  in the past 24 hours?  No  If you have had runny nose, nasal congestion, sneezing in the past 24 hours, has it worsened? No  COVID-19 EXPOSURE  Have you traveled outside the state in the past 14 days? No  Have you been in contact with someone with a confirmed diagnosis of COVID-19 or PUI in the past 14 days without wearing appropriate PPE? No  Have you been living in the same home as a person with confirmed diagnosis of COVID-19 or a PUI (household contact)? No  Have you been diagnosed with COVID-19? No   

## 2020-09-20 MED ORDER — LORAZEPAM 1 MG PO TABS
1.0000 mg | ORAL_TABLET | Freq: Two times a day (BID) | ORAL | Status: DC
  Administered 2020-09-20 – 2020-09-23 (×6): 1 mg via ORAL
  Filled 2020-09-20 (×6): qty 1

## 2020-09-20 MED ORDER — CLONIDINE HCL 0.1 MG PO TABS
0.1000 mg | ORAL_TABLET | Freq: Three times a day (TID) | ORAL | Status: DC | PRN
Start: 2020-09-20 — End: 2020-09-24

## 2020-09-20 MED ORDER — HALOPERIDOL 5 MG PO TABS
5.0000 mg | ORAL_TABLET | Freq: Every day | ORAL | Status: DC
  Administered 2020-09-20 – 2020-09-21 (×2): 5 mg via ORAL
  Filled 2020-09-20 (×3): qty 1

## 2020-09-20 MED ORDER — CLONIDINE HCL 0.1 MG PO TABS
0.1000 mg | ORAL_TABLET | Freq: Two times a day (BID) | ORAL | Status: DC
  Administered 2020-09-20 – 2020-09-24 (×8): 0.1 mg via ORAL
  Filled 2020-09-20 (×13): qty 1

## 2020-09-20 MED ORDER — HALOPERIDOL LACTATE 5 MG/ML IJ SOLN
5.0000 mg | Freq: Every day | INTRAMUSCULAR | Status: DC
  Filled 2020-09-20 (×3): qty 1

## 2020-09-20 MED ORDER — LORAZEPAM 2 MG/ML IJ SOLN: 1.0000 mg | Freq: Two times a day (BID) | INTRAMUSCULAR | Status: DC

## 2020-09-20 NOTE — Progress Notes (Signed)
[facility] MD Progress Note  09/20/2020 11:51 AM Lindsey Richmond  MRN:  [id number] Subjective:  Patient is a 24 year old female with a past psychiatric history significant for schizophrenia who originally presented to the [facility] emergency department on 09/09/2020. She was brought there by EMS. The patient had apparently been off her psychiatric medications for an unknown specified amount of time. She was essentially nonverbal. She apparently was very agitated in the emergency department, and attempted to leave the emergency department. She was placed under involuntary commitment by the police.  Objective: Patient is seen and examined.  Patient is a 24 year old female with the above-stated past psychiatric history who is seen in follow-up.  Her improvement continues to progress and in a slow manner.  She is verbal today.  She is walking around.  She did take a shower this morning.  She denied auditory hallucinations.  She is able to smile and engage, but sometimes it is inappropriate.  We reduced her Haldol dose yesterday because of significant sedation.  She does not appear to have regressed any from the decrease in her Haldol dosage.  We discussed today the need to continue taking her paliperidone injection and how she is able to stay out of the hospital.  She did easily admit that she hates being in the hospital.  Her blood pressure is 132/96.  Pulse is 90.  She is afebrile.  She slept 6.5 hours last night.  No new laboratories.  Principal Problem: <principal problem not specified> Diagnosis: Active Problems:   Paranoid schizophrenia (HCC)  Total Time spent with patient: 20 minutes  Past Psychiatric History: See admission H&P  Past Medical History:  Past Medical History:  Diagnosis Date  . Anxiety   . Depression   . Oppositional defiant behavior   . Schizo affective schizophrenia (HCC)   . Schizophrenia (HCC)    History reviewed. No pertinent surgical history. Family  History:  Family History  Problem Relation Age of Onset  . Heart failure Mother   . Hypertension Mother   . Diabetes Father    Family Psychiatric  History: See admission H&P Social History:  Social History   Substance and Sexual Activity  Alcohol Use No     Social History   Substance and Sexual Activity  Drug Use No    Social History   Socioeconomic History  . Marital status: Single    Spouse name: Not on file  . Number of children: Not on file  . Years of education: Not on file  . Highest education level: Not on file  Occupational History  . Occupation: Unemployed  Tobacco Use  . Smoking status: Never Smoker  . Smokeless tobacco: Never Used  Vaping Use  . Vaping Use: Never used  Substance and Sexual Activity  . Alcohol use: No  . Drug use: No  . Sexual activity: Never    Birth control/protection: None  Other Topics Concern  . Not on file  Social History Narrative   ** Merged History Encounter **       Pt lives with her mother, brother, and father.  She receives outpatient psychiatric care through [facility].   Social Determinants of Health   Financial Resource Strain: Not on file  Food Insecurity: Not on file  Transportation Needs: Not on file  Physical Activity: Not on file  Stress: Not on file  Social Connections: Not on file   Additional Social History:    Pain Medications: see MAR Prescriptions: see MAR Over the Counter:  see MAR History of alcohol / drug use?: No history of alcohol / drug abuse Longest period of sobriety (when/how long): none Negative Consequences of Use: Financial Withdrawal Symptoms: Other (Comment) (none)                    Sleep: Good  Appetite:  Fair  Current Medications: Current Facility-Administered Medications  Medication Dose Route Frequency Provider Last Rate Last Admin  . acetaminophen (TYLENOL) tablet 650 mg  650 mg Oral Q6H PRN Antonieta Pert, MD      . alum & mag hydroxide-simeth (MAALOX/MYLANTA)  200-200-20 MG/5ML suspension 30 mL  30 mL Oral Q4H PRN Antonieta Pert, MD      . haloperidol (HALDOL) tablet 10 mg  10 mg Oral QHS Antonieta Pert, MD   10 mg at 09/19/20 2041   Or  . haloperidol lactate (HALDOL) injection 10 mg  10 mg Intramuscular QHS Antonieta Pert, MD      . haloperidol (HALDOL) tablet 5 mg  5 mg Oral Daily Antonieta Pert, MD   5 mg at 09/20/20 0745   Or  . haloperidol lactate (HALDOL) injection 5 mg  5 mg Intramuscular Daily Antonieta Pert, MD      . LORazepam (ATIVAN) tablet 2 mg  2 mg Oral BID Antonieta Pert, MD   2 mg at 09/20/20 0745   Or  . LORazepam (ATIVAN) injection 2 mg  2 mg Intramuscular BID Antonieta Pert, MD      . ziprasidone (GEODON) injection 20 mg  20 mg Intramuscular Q12H PRN Antonieta Pert, MD       And  . LORazepam (ATIVAN) tablet 1 mg  1 mg Oral PRN Antonieta Pert, MD      . magnesium hydroxide (MILK OF MAGNESIA) suspension 30 mL  30 mL Oral Daily PRN Antonieta Pert, MD      . paliperidone (INVEGA) 24 hr tablet 9 mg  9 mg Oral QHS Antonieta Pert, MD   9 mg at 09/19/20 2041  . traZODone (DESYREL) tablet 100 mg  100 mg Oral QHS Antonieta Pert, MD   100 mg at 09/19/20 2042    Lab Results: No results found for this or any previous visit (from the past 48 hour(s)).  Blood Alcohol level:  Lab Results  Component Value Date   ETH <10 09/09/2020   ETH <10 01/04/2020    Metabolic Disorder Labs: Lab Results  Component Value Date   HGBA1C 5.6 12/27/2019   MPG 114.02 12/27/2019   MPG 105.41 12/04/2019   Lab Results  Component Value Date   PROLACTIN 57.7 (H) 12/04/2019   PROLACTIN 75.7 (H) 10/23/2019   Lab Results  Component Value Date   CHOL 145 12/27/2019   TRIG 59 12/27/2019   HDL 24 (L) 12/27/2019   CHOLHDL 6.0 12/27/2019   VLDL 12 12/27/2019   LDLCALC 109 (H) 12/27/2019   LDLCALC 100 (H) 12/04/2019    Physical Findings: AIMS: Facial and Oral Movements Muscles of Facial Expression:  None, normal Lips and Perioral Area: None, normal Jaw: None, normal Tongue: None, normal,Extremity Movements Upper (arms, wrists, hands, fingers): None, normal Lower (legs, knees, ankles, toes): None, normal, Trunk Movements Neck, shoulders, hips: None, normal, Overall Severity Severity of abnormal movements (highest score from questions above): None, normal Incapacitation due to abnormal movements: None, normal Patient's awareness of abnormal movements (rate only patient's report): No Awareness, Dental Status Current problems with teeth and/or dentures?:  No Does patient usually wear dentures?: No  CIWA:  CIWA-Ar Total: 2 COWS:  COWS Total Score: 4  Musculoskeletal: Strength & Muscle Tone: within normal limits Gait & Station: normal Patient leans: N/A  Psychiatric Specialty Exam: Physical Exam Vitals and nursing note reviewed.  Constitutional:      Appearance: She is obese.  HENT:     Head: Normocephalic and atraumatic.  Pulmonary:     Effort: Pulmonary effort is normal.  Neurological:     General: No focal deficit present.     Mental Status: She is alert and oriented to person, place, and time.     Review of Systems  Blood pressure (!) 132/96, pulse 90, temperature 98.4 F (36.9 C), temperature source Oral, resp. rate 20, height 5\' 4"  (1.626 m), weight 121.6 kg, last menstrual period 09/13/2020, SpO2 97 %.Body mass index is 46 kg/m.  General Appearance: Disheveled  Eye Contact:  Fair  Speech:  Normal Rate  Volume:  Decreased  Mood:  Dysphoric  Affect:  Flat  Thought Process:  Goal Directed and Descriptions of Associations: Intact  Orientation:  Full (Time, Place, and Person)  Thought Content:  Delusions, Hallucinations: Auditory and Paranoid Ideation  Suicidal Thoughts:  No  Homicidal Thoughts:  No  Memory:  Immediate;   Fair Recent;   Fair Remote;   Fair  Judgement:  Impaired  Insight:  Fair  Psychomotor Activity:  Decreased  Concentration:  Concentration:  Fair and Attention Span: Fair  Recall:  09/15/2020 of Knowledge:  Fair  Language:  Fair  Akathisia:  Negative  Handed:  Right  AIMS (if indicated):     Assets:  Desire for Improvement Resilience  ADL's:  Impaired  Cognition:  WNL  Sleep:  Number of Hours: 6.75     Treatment Plan Summary: Daily contact with patient to assess and evaluate symptoms and progress in treatment, Medication management and Plan : Patient is seen and examined.  Patient is a 24 year old female with the above-stated past psychiatric history who is seen in follow-up.   Diagnosis: 1. Schizophrenia; catatonic type 2. Poor p.o. intake 3. Hypertension 4. Tachycardia  Pertinent findings on examination today: 1.  Catatonia appears to be controlled at least at this point. 2.  Still some mild sedation. 3.  Tachycardia is resolved, QTC is normal, blood pressure remains elevated.  Plan: 1.  Decrease Haldol to 5 mg p.o. nightly.  This is for psychosis, catatonia and sleep. 2.  Decrease lorazepam to 1 mg p.o. or IM twice daily for catatonia. 3.  Continue paliperidone 9 mg p.o. nightly for psychosis. 4.  Continue trazodone 100 mg p.o. nightly as needed insomnia. 5.  Patient received the paliperidone long-acting injection 156 mg IM 1 to 2 days prior to admission, and received an additional 156 mg on 09/17/2020.  This is for psychosis. 6.  Review of the electronic medical record revealed that she had received clonidine 0.2 mg p.o. twice daily on her previous hospitalization.  I will restart that at 0.1 mg p.o. twice daily as well as 0.1 mg p.o. every 8 hours as needed a systolic blood pressure greater than 160. 7.  Disposition planning-in progress.  09/19/2020, MD 09/20/2020, 11:51 AM

## 2020-09-20 NOTE — Progress Notes (Signed)
Pt stated she was feeling better, pt more vocal on the unit this evening.    09/20/20 2200  Psych Admission Type (Psych Patients Only)  Admission Status Involuntary  Psychosocial Assessment  Patient Complaints Anxiety  Eye Contact Fair  Facial Expression Blank;Flat  Affect Flat  Speech Logical/coherent;Slow  Interaction Assertive  Motor Activity Slow  Appearance/Hygiene Disheveled;Poor hygiene  Behavior Characteristics Cooperative  Mood Anxious;Pleasant  Thought Process  Coherency Concrete thinking  Content Blaming others  Delusions None reported or observed  Perception WDL  Hallucination None reported or observed  Judgment Limited  Confusion None  Danger to Self  Current suicidal ideation? Denies  Danger to Others  Danger to Others None reported or observed

## 2020-09-20 NOTE — Progress Notes (Signed)
Progress note    09/20/20 0745  Psych Admission Type (Psych Patients Only)  Admission Status Involuntary  Psychosocial Assessment  Patient Complaints Anxiety  Eye Contact Fair  Facial Expression Blank;Flat  Affect Flat  Speech Logical/coherent;Slow  Interaction Assertive  Motor Activity Slow  Appearance/Hygiene Disheveled;Poor hygiene  Behavior Characteristics Cooperative;Appropriate to situation;Anxious  Mood Anxious;Pleasant  Thought Process  Coherency Concrete thinking  Content Blaming others  Delusions None reported or observed  Perception WDL  Hallucination None reported or observed  Judgment Limited  Confusion None  Danger to Self  Current suicidal ideation? Denies  Danger to Others  Danger to Others None reported or observed

## 2020-09-20 NOTE — Tx Team (Signed)
Interdisciplinary Treatment and Diagnostic Plan Update  09/20/2020 Time of Session: 9:25am Lindsey Richmond MRN: [id number]  Principal Diagnosis: <principal problem not specified>  Secondary Diagnoses: Active Problems:   Paranoid schizophrenia (HCC)   Current Medications:  Current Facility-Administered Medications  Medication Dose Route Frequency Provider Last Rate Last Admin  . acetaminophen (TYLENOL) tablet 650 mg  650 mg Oral Q6H PRN Antonieta Pert, MD      . alum & mag hydroxide-simeth (MAALOX/MYLANTA) 200-200-20 MG/5ML suspension 30 mL  30 mL Oral Q4H PRN Antonieta Pert, MD      . cloNIDine (CATAPRES) tablet 0.1 mg  0.1 mg Oral BID Antonieta Pert, MD      . cloNIDine (CATAPRES) tablet 0.1 mg  0.1 mg Oral TID PRN Antonieta Pert, MD      . haloperidol (HALDOL) tablet 5 mg  5 mg Oral QHS Antonieta Pert, MD       Or  . haloperidol lactate (HALDOL) injection 5 mg  5 mg Intramuscular QHS Antonieta Pert, MD      . LORazepam (ATIVAN) tablet 1 mg  1 mg Oral BID Antonieta Pert, MD       Or  . LORazepam (ATIVAN) injection 1 mg  1 mg Intramuscular BID Antonieta Pert, MD      . ziprasidone (GEODON) injection 20 mg  20 mg Intramuscular Q12H PRN Antonieta Pert, MD       And  . LORazepam (ATIVAN) tablet 1 mg  1 mg Oral PRN Antonieta Pert, MD      . magnesium hydroxide (MILK OF MAGNESIA) suspension 30 mL  30 mL Oral Daily PRN Antonieta Pert, MD      . paliperidone (INVEGA) 24 hr tablet 9 mg  9 mg Oral QHS Antonieta Pert, MD   9 mg at 09/19/20 2041  . traZODone (DESYREL) tablet 100 mg  100 mg Oral QHS Antonieta Pert, MD   100 mg at 09/19/20 2042   PTA Medications: Medications Prior to Admission  Medication Sig Dispense Refill Last Dose  . benztropine (COGENTIN) 0.5 MG tablet Take 1 tablet (0.5 mg total) by mouth 2 (two) times daily as needed for tremors (EPS). 60 tablet 2   . cloNIDine (CATAPRES) 0.2 MG tablet Take 1 tablet (0.2 mg total) by mouth  2 (two) times daily. 60 tablet 2   . paliperidone (INVEGA SUSTENNA) 156 MG/ML SUSY injection Inject 1 mL (156 mg total) into the muscle every 30 (thirty) days. 1.2 mL 11   . risperiDONE (RISPERDAL) 3 MG tablet Take 2 tablets (6 mg total) by mouth at bedtime. 60 tablet 3   . traZODone (DESYREL) 150 MG tablet Take 150 mg by mouth at bedtime.       Patient Stressors: Financial difficulties Marital or family conflict Medication change or noncompliance  Patient Strengths: Motivation for treatment/growth Physical Health Supportive family/friends  Treatment Modalities: Medication Management, Group therapy, Case management,  1 to 1 session with clinician, Psychoeducation, Recreational therapy.   Physician Treatment Plan for Primary Diagnosis: <principal problem not specified> Long Term Goal(s): Improvement in symptoms so as ready for discharge Improvement in symptoms so as ready for discharge   Short Term Goals: Ability to identify changes in lifestyle to reduce recurrence of condition will improve Ability to verbalize feelings will improve Ability to demonstrate self-control will improve Ability to identify and develop effective coping behaviors will improve Ability to maintain clinical measurements within normal limits will improve Compliance with  prescribed medications will improve Ability to identify changes in lifestyle to reduce recurrence of condition will improve Ability to verbalize feelings will improve Ability to demonstrate self-control will improve Ability to identify and develop effective coping behaviors will improve Ability to maintain clinical measurements within normal limits will improve Compliance with prescribed medications will improve  Medication Management: Evaluate patient's response, side effects, and tolerance of medication regimen.  Therapeutic Interventions: 1 to 1 sessions, Unit Group sessions and Medication administration.  Evaluation of Outcomes:  Progressing  Physician Treatment Plan for Secondary Diagnosis: Active Problems:   Paranoid schizophrenia (HCC)  Long Term Goal(s): Improvement in symptoms so as ready for discharge Improvement in symptoms so as ready for discharge   Short Term Goals: Ability to identify changes in lifestyle to reduce recurrence of condition will improve Ability to verbalize feelings will improve Ability to demonstrate self-control will improve Ability to identify and develop effective coping behaviors will improve Ability to maintain clinical measurements within normal limits will improve Compliance with prescribed medications will improve Ability to identify changes in lifestyle to reduce recurrence of condition will improve Ability to verbalize feelings will improve Ability to demonstrate self-control will improve Ability to identify and develop effective coping behaviors will improve Ability to maintain clinical measurements within normal limits will improve Compliance with prescribed medications will improve     Medication Management: Evaluate patient's response, side effects, and tolerance of medication regimen.  Therapeutic Interventions: 1 to 1 sessions, Unit Group sessions and Medication administration.  Evaluation of Outcomes: Progressing   RN Treatment Plan for Primary Diagnosis: <principal problem not specified> Long Term Goal(s): Knowledge of disease and therapeutic regimen to maintain health will improve  Short Term Goals: Ability to remain free from injury will improve, Ability to verbalize frustration and anger appropriately will improve, Ability to identify and develop effective coping behaviors will improve and Compliance with prescribed medications will improve  Medication Management: RN will administer medications as ordered by provider, will assess and evaluate patient's response and provide education to patient for prescribed medication. RN will report any adverse and/or side  effects to prescribing provider.  Therapeutic Interventions: 1 on 1 counseling sessions, Psychoeducation, Medication administration, Evaluate responses to treatment, Monitor vital signs and CBGs as ordered, Perform/monitor CIWA, COWS, AIMS and Fall Risk screenings as ordered, Perform wound care treatments as ordered.  Evaluation of Outcomes: Progressing   LCSW Treatment Plan for Primary Diagnosis: <principal problem not specified> Long Term Goal(s): Safe transition to appropriate next level of care at discharge, Engage patient in therapeutic group addressing interpersonal concerns.  Short Term Goals: Engage patient in aftercare planning with referrals and resources, Increase social support, Identify triggers associated with mental health/substance abuse issues and Increase skills for wellness and recovery  Therapeutic Interventions: Assess for all discharge needs, 1 to 1 time with Social worker, Explore available resources and support systems, Assess for adequacy in community support network, Educate family and significant other(s) on suicide prevention, Complete Psychosocial Assessment, Interpersonal group therapy.  Evaluation of Outcomes: Progressing   Progress in Treatment: Attending groups: No. Participating in groups: No. Taking medication as prescribed: No. Toleration medication: No. Family/Significant other contact made: No, will contact:  patient declined consents Patient understands diagnosis: No. Discussing patient identified problems/goals with staff: No. Medical problems stabilized or resolved: Yes. Denies suicidal/homicidal ideation: Yes. Issues/concerns per patient self-inventory: No.   New problem(s) identified: Yes, Describe:  Patient currently non-verbal and catotanic.   New Short Term/Long Term Goal(s): medication stabilization, elimination of SI thoughts,  development of comprehensive mental wellness plan.   Patient Goals:  Patient unable to participate in meeting  due to being non-verbal at this time.  Discharge Plan or Barriers: Patient established with [facility] for medication management. Patient is able to return to live with mother post discharge.  Reason for Continuation of Hospitalization: Delusions  Hallucinations Medication stabilization  Estimated Length of Stay: 3-5 days  Attendees: Patient:  09/15/2020   Physician:  09/15/2020   Nursing:  09/15/2020   RN Care Manager: 09/15/2020  Social Worker: Ruthann Cancer, LCSW 09/15/2020   Recreational Therapist:  09/15/2020   Other:  09/15/2020  Other:  09/15/2020   Other: 09/15/2020       Scribe for Treatment Team: Otelia Santee, LCSW 09/20/2020 12:10 PM

## 2020-09-20 NOTE — [facility] Group Notes (Signed)
LCSW Group Therapy Notes  Type of Therapy and Topic: Group Therapy: Healthy Vs. Unhealthy Coping Strategies  Date and Time: 09/20/20 1:00PM  Participation Level: [facility] PARTICIPATION LEVEL: Did Not Attend  Description of Group:  In this group, patients will be encouraged to explore their healthy and unhealthy coping strategics. Coping strategies are actions that we take to deal with stress, problems, or uncomfortable emotions in our daily lives. Each patient will be challenged to read some scenarios and discuss the unhealthy and healthy coping strategies within those scenarios. Also, each patient will be challenged to describe current healthy and unhealthy strategies that they use in their own lives and discuss the outcomes and barriers to those strategies. This group will be process-oriented, with patients participating in exploration of their own experiences as well as giving and receiving support and challenge from other group members.  Therapeutic Goals: 1. Patient will identify personal healthy and unhealthy coping strategies. 2. Patient will identify healthy and unhealthy coping strategies, in others, through scenarios.  3. Patient will identify expected outcomes of healthy and unhealthy coping strategies. 4. Patient will identify barriers to using healthy coping strategies.   Summary of Patient Progress: Pt did not attend.    Therapeutic Modalities:  Cognitive Behavioral Therapy Solution Focused Therapy Motivational Interviewing    Rehema Muffley MSW, LCSW Clincal Social Worker  [facility] 

## 2020-09-20 NOTE — Progress Notes (Signed)
Adult Psychoeducational Group Note  Date:  09/20/2020 Time:  9:37 PM  Group Topic/Focus:  Wrap-Up Group:   The focus of this group is to help patients review their daily goal of treatment and discuss progress on daily workbooks.  Participation Level:  Did Not Attend  Participation Quality:  Did Not Attend  Affect:  Did Not Attend  Cognitive:  Did Not Attend  Insight: None  Engagement in Group:  Did Not Attend  Modes of Intervention:  Did Not Attend  Additional Comments:  Pt did not attend evening wrap up group tonight.  Felipa Furnace 09/20/2020, 9:37 PM

## 2020-09-20 NOTE — [facility] Group Notes (Signed)
Adult Psychoeducational Group Note  Date:  09/20/2020 Time:  9:52 AM  Group Topic/Focus:  Goals Group:   The focus of this group is to help patients establish daily goals to achieve during treatment and discuss how the patient can incorporate goal setting into their daily lives to aide in recovery.  Participation Level:  Did Not Attend  Lindsey Richmond [facility] 09/20/2020, 9:52 AM

## 2020-09-20 NOTE — Plan of Care (Signed)
  Problem: Education: Goal: Ability to state activities that reduce stress will improve Outcome: Progressing   Problem: Coping: Goal: Ability to identify and develop effective coping behavior will improve Outcome: Progressing   Problem: Education: Goal: Utilization of techniques to improve thought processes will improve Outcome: Progressing Goal: Knowledge of the prescribed therapeutic regimen will improve Outcome: Progressing   

## 2020-09-20 NOTE — Progress Notes (Signed)
Recreation Therapy Notes  Date: 12.20.21 Time: 1000 Location: [address] Dayroom  Group Topic: Communication  Goal Area(s) Addresses:  Patient will effectively communicate with peers in group.  Patient will verbalize benefit of healthy communication. Patient will verbalize positive effect of healthy communication on post d/c goals.  Patient will identify communication techniques that made activity effective for group.   Behavioral Response: None  Intervention: Geometrical drawings, pencils, blank paper  Activity: Geometrical Drawings.  One patient will describe a picture to peers during group.  The presenter could be as descriptive as possible (top, bottom, left, right, big, small, shade it in, don't shade it in, etc) in describing the picture.  The remaining peers could only as the presenter to repeat themselves, they could not ask any detailed questions.  Once the presenter finished, they would show the picture to the rest of the group and compare their drawings to the picture to see how close they were to the original.  Education: Communication, Discharge Planning  Education Outcome: Acknowledges understanding/In group clarification offered/Needs additional education.   Clinical Observations/Feedback: Pt came into group, got instructions and left.      Caroll Rancher, LRT/CTRS    Caroll Rancher A 09/20/2020 11:20 AM

## 2020-09-21 NOTE — Progress Notes (Signed)
Adult Psychoeducational Group Note  Date:  09/21/2020 Time:  11:05 PM  Group Topic/Focus:  Wrap-Up Group:   The focus of this group is to help patients review their daily goal of treatment and discuss progress on daily workbooks.  Participation Level:  Did Not Attend  Participation Quality:  Did Not Attend  Affect:  Did Not Attend  Cognitive:  Did Not Attend  Insight: None  Engagement in Group:  Did Not Attend  Modes of Intervention:  Did Not Attend  Additional Comments:  Pt did not attend evening wrap up group tonight.  Felipa Furnace 09/21/2020, 11:05 PM

## 2020-09-21 NOTE — Progress Notes (Signed)
   09/21/20 2000  Psych Admission Type (Psych Patients Only)  Admission Status Involuntary  Psychosocial Assessment  Patient Complaints Anxiety  Eye Contact Fair  Facial Expression Animated  Affect Appropriate to circumstance  Speech Logical/coherent;Slow  Interaction Assertive  Motor Activity Slow  Appearance/Hygiene Body odor;Disheveled;Poor hygiene  Behavior Characteristics Cooperative  Mood Anxious;Pleasant  Thought Process  Coherency WDL  Content WDL  Delusions None reported or observed  Perception WDL  Hallucination None reported or observed  Judgment Limited  Confusion None  Danger to Self  Current suicidal ideation? Denies  Danger to Others  Danger to Others None reported or observed

## 2020-09-21 NOTE — Plan of Care (Signed)
°  Problem: Education: Goal: Ability to state activities that reduce stress will improve Outcome: Progressing   Problem: Coping: Goal: Ability to identify and develop effective coping behavior will improve Outcome: Progressing   Problem: Education: Goal: Utilization of techniques to improve thought processes will improve Outcome: Progressing Goal: Knowledge of the prescribed therapeutic regimen will improve Outcome: Progressing   

## 2020-09-21 NOTE — Progress Notes (Signed)
Progress note    09/21/20 0741  Psych Admission Type (Psych Patients Only)  Admission Status Involuntary  Psychosocial Assessment  Patient Complaints Anxiety  Eye Contact Fair  Facial Expression Animated  Affect Appropriate to circumstance  Speech Logical/coherent;Slow  Interaction Assertive  Motor Activity Slow  Appearance/Hygiene Body odor;Disheveled;Poor hygiene  Behavior Characteristics Cooperative;Appropriate to situation;Anxious  Mood Anxious;Pleasant  Thought Process  Coherency WDL  Content WDL  Delusions None reported or observed  Perception WDL  Hallucination None reported or observed  Judgment Limited  Confusion None  Danger to Self  Current suicidal ideation? Denies  Danger to Others  Danger to Others None reported or observed

## 2020-09-21 NOTE — Progress Notes (Signed)
The focus of this group is to help patients establish daily goals to achieve during treatment and discuss how the patient can incorporate goal setting into their daily lives to aide in recovery.Patient attended group. 

## 2020-09-21 NOTE — Progress Notes (Signed)
[facility]  09/21/2020 12:38 PM [name]  MRN:  [id number] Subjective:  Patient is a 24 year old female with a past psychiatric history significant for schizophrenia who [facility]/09/[facility]. She was brought there by EMS. The patient had apparently been off her psychiatric medications for an unknown specified amount of time. She was essentially nonverbal. She apparently was very agitated in the emergency department, and attempted to leave the emergency department. She was placed under involuntary commitment by the police.  Objective: Patient is seen and examined, chart reviewed and case discussed with treatment team.  Patient is a 24 year old female with the above-stated past psychiatric history who is seen in follow-up.  Her improvement continues to progress but in a slow manner. She is visible on the unit but is often off by herself. She has not had any catatonia with the reduced Haldol  dosage. She is calm and cooperative but appears sedated. She is able to smile and engage, but sometimes it is inappropriate. She is She stated she slept well last night, woke up twice but went back to sleep . She reported a good appetite. She stated she lives with her parents and her older brother and she is in the hospital because "they were worried about my anxiety". She stated her mood is "melancholy" and denies depression and anxiety. She is visible on the unit and is attending group therapy. Her speech is slow with a soft volume. Her eye contact is fair, affect is flat. 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 She is wearing a soiled top and appears disheveled, however she is doing better at taking care of her personal hygiene.  She denied auditory and visual hallucinations. She also denies suicidal and homicidal ideation.   Her Haldol dose was  decreased yesterday because of significant sedation.  She does not appear to have regressed any from the decrease in her Haldol dosage.  We discussed today the need to continue taking her paliperidone injection and how she is able to stay out of the hospital.  She did easily admit that she hates being in the hospital and was hoping to be discharged today.  Her blood pressure is 107/80  Pulse is 115.  She is afebrile.  She slept 8 hours last night.  No new laboratories.  Principal Problem: Paranoid schizophrenia (HCC) Diagnosis: Active Problems:   Paranoid schizophrenia (HCC)  Total Time spent with patient: 35 minutes  Past Psychiatric History: See admission H&P  Past Medical History:  Past Medical History:  Diagnosis Date  . Anxiety   . Depression   . Oppositional defiant behavior   . Schizo affective schizophrenia (HCC)   . Schizophrenia (HCC)    History reviewed. No pertinent surgical history. Family History:  Family History  Problem Relation Age of Onset  . Heart failure Mother   . Hypertension Mother   . Diabetes Father    Family Psychiatric  History: See admission H&P Social History:  Social History   Substance and Sexual Activity  Alcohol Use No     Social History   Substance and Sexual Activity  Drug Use No    Social History   Socioeconomic History  . Marital status: Single    Spouse name: Not on file  . Number of children: Not on file  . Years of education: Not on file  . Highest education level:  Not on file  Occupational History  . Occupation: Unemployed  Tobacco Use  . Smoking status: Never Smoker  . Smokeless tobacco: Never Used  Vaping Use  . Vaping Use: Never used  Substance and Sexual Activity  . Alcohol use: No  . Drug use: No  . Sexual activity: Never    Birth control/protection: None  Other Topics Concern  . Not on file  Social History Narrative   ** Merged History Encounter **       Pt lives with her mother, brother, and father.  She  receives outpatient psychiatric care through [facility].   Social Determinants of Health   Financial Resource Strain: Not on file  Food Insecurity: Not on file  Transportation Needs: Not on file  Physical Activity: Not on file  Stress: Not on file  Social Connections: Not on file   Additional Social History:    Pain Medications: see MAR Prescriptions: see MAR Over the Counter: see MAR History of alcohol / drug use?: No history of alcohol / drug abuse Longest period of sobriety (when/how long): none Negative Consequences of Use: Financial Withdrawal Symptoms: Other (Comment) (none)                    Sleep: Good  Appetite:  Fair  Current Medications: Current Facility-Administered Medications  Medication Dose Route Frequency Provider Last Rate Last Admin  . acetaminophen (TYLENOL) tablet 650 mg  650 mg Oral Q6H PRN Antonieta Pert, MD      . alum & mag hydroxide-simeth (MAALOX/MYLANTA) 200-200-20 MG/5ML suspension 30 mL  30 mL Oral Q4H PRN Antonieta Pert, MD      . cloNIDine (CATAPRES) tablet 0.1 mg  0.1 mg Oral BID Antonieta Pert, MD   0.1 mg at 09/21/20 0741  . cloNIDine (CATAPRES) tablet 0.1 mg  0.1 mg Oral TID PRN Antonieta Pert, MD      . haloperidol (HALDOL) tablet 5 mg  5 mg Oral QHS Antonieta Pert, MD   5 mg at 09/20/20 2040   Or  . haloperidol lactate (HALDOL) injection 5 mg  5 mg Intramuscular QHS Antonieta Pert, MD      . LORazepam (ATIVAN) tablet 1 mg  1 mg Oral BID Antonieta Pert, MD   1 mg at 09/21/20 0741   Or  . LORazepam (ATIVAN) injection 1 mg  1 mg Intramuscular BID Antonieta Pert, MD      . ziprasidone (GEODON) injection 20 mg  20 mg Intramuscular Q12H PRN Antonieta Pert, MD       And  . LORazepam (ATIVAN) tablet 1 mg  1 mg Oral PRN Antonieta Pert, MD      . magnesium hydroxide (MILK OF MAGNESIA) suspension 30 mL  30 mL Oral Daily PRN Antonieta Pert, MD      . paliperidone (INVEGA) 24 hr tablet 9 mg  9 mg  Oral QHS Antonieta Pert, MD   9 mg at 09/20/20 2040  . traZODone (DESYREL) tablet 100 mg  100 mg Oral QHS Antonieta Pert, MD   100 mg at 09/20/20 2040    Lab Results: No results found for this or any previous visit (from the past 48 hour(s)).  Blood Alcohol level:  Lab Results  Component Value Date   [facility] <10 09/09/2020   ETH <10 01/04/2020    Metabolic Disorder Labs: Lab Results  Component Value Date   HGBA1C 5.6 12/27/2019   MPG 114.02 12/27/2019  MPG 105.41 12/04/2019   Lab Results  Component Value Date   PROLACTIN 57.7 (H) 12/04/2019   PROLACTIN 75.7 (H) 10/23/2019   Lab Results  Component Value Date   CHOL 145 12/27/2019   TRIG 59 12/27/2019   HDL 24 (L) 12/27/2019   CHOLHDL 6.0 12/27/2019   VLDL 12 12/27/2019   LDLCALC 109 (H) 12/27/2019   LDLCALC 100 (H) 12/04/2019    Physical Findings: AIMS: Facial and Oral Movements Muscles of Facial Expression: None, normal Lips and Perioral Area: None, normal Jaw: None, normal Tongue: None, normal,Extremity Movements Upper (arms, wrists, hands, fingers): None, normal Lower (legs, knees, ankles, toes): None, normal, Trunk Movements Neck, shoulders, hips: None, normal, Overall Severity Severity of abnormal movements (highest score from questions above): None, normal Incapacitation due to abnormal movements: None, normal Patient's awareness of abnormal movements (rate only patient's report): No Awareness, Dental Status Current problems with teeth and/or dentures?: No Does patient usually wear dentures?: No  CIWA:  CIWA-Ar Total: 2 COWS:  COWS Total Score: 4  Musculoskeletal: Strength & Muscle Tone: within normal limits Gait & Station: normal Patient leans: N/A  Psychiatric Specialty Exam: Physical Exam Vitals and nursing note reviewed.  Constitutional:      Appearance: She is obese.  HENT:     Head: Normocephalic and atraumatic.  Pulmonary:     Effort: Pulmonary effort is normal.  Neurological:      General: No focal deficit present.     Mental Status: She is alert and oriented to person, place, and time.     Review of Systems  Blood pressure 107/80, pulse (!) 115, temperature 98.4 F (36.9 C), temperature source Oral, resp. rate 16, height 5\' 4"  (1.626 m), weight 121.6 kg, last menstrual period 09/13/2020, SpO2 97 %.Body mass index is 46 kg/m.  General Appearance: Disheveled, soiled clothes  Eye Contact:  Fair  Speech:  Normal Rate  Volume:  Decreased  Mood:  Dysphoric  Affect:  Flat  Thought Process:  Goal Directed and Descriptions of Associations: Intact  Orientation:  Full (Time, Place, and Person)  Thought Content:  Delusions, Hallucinations: Auditory and Paranoid Ideation  Suicidal Thoughts:  No  Homicidal Thoughts:  No  Memory:  Immediate;   Fair Recent;   Fair Remote;   Fair  Judgement:  Impaired  Insight:  Fair  Psychomotor Activity:  Decreased  Concentration:  Concentration: Fair and Attention Span: Fair  Recall:  09/15/2020 of Knowledge:  Fair  Language:  Fair  Akathisia:  Negative  Handed:  Right  AIMS (if indicated):     Assets:  Desire for Improvement Resilience  ADL's:  Impaired  Cognition:  WNL  Sleep:  Number of Hours: 8     Treatment Plan Summary: Daily contact with patient to assess and evaluate symptoms and progress in treatment, Medication management and Plan : Patient is seen and examined.  Patient is a 24 year old female with the above-stated past psychiatric history who is seen in follow-up.   Diagnosis: 1. Schizophrenia; catatonic type 2. Poor p.o. intake 3. Hypertension 4. Tachycardia  Pertinent findings on examination today: 1.  Catatonia appears to be controlled at least at this point. 2.  Still some mild sedation. 3.  Tachycardia is resolved, QTC is normal, blood pressure remains elevated.  Plan: 1.  Continue Haldol to 5 mg p.o. nightly. This is for psychosis, catatonia and sleep. 2.  Continue lorazepam to 1 mg p.o. or IM  twice daily for catatonia. 3.  Continue paliperidone 9  mg p.o. nightly for psychosis. 4.  Continue trazodone 100 mg p.o. nightly as needed insomnia. 5.  Patient received the paliperidone long-acting injection 156 mg IM 1 to 2 days prior to admission, and received an additional 156 mg on 09/17/2020.  This is for psychosis. 6.  Review of the electronic medical record revealed that she had received clonidine 0.2 mg p.o. twice daily on her previous hospitalization.  I will restart that at 0.1 mg p.o. twice daily as well as 0.1 mg p.o. every 8 hours as needed a systolic blood pressure greater than 160. 7.  Disposition planning-in progress.  Laveda Abbe, NP 09/21/2020, 12:38 PM

## 2020-09-22 MED ORDER — HALOPERIDOL LACTATE 5 MG/ML IJ SOLN
5.0000 mg | Freq: Two times a day (BID) | INTRAMUSCULAR | Status: DC
  Filled 2020-09-22 (×5): qty 1

## 2020-09-22 MED ORDER — HALOPERIDOL 5 MG PO TABS: 5.0000 mg | ORAL_TABLET | Freq: Two times a day (BID) | ORAL | Status: DC

## 2020-09-22 MED ORDER — HALOPERIDOL LACTATE 5 MG/ML IJ SOLN: 5.0000 mg | Freq: Two times a day (BID) | INTRAMUSCULAR | Status: DC

## 2020-09-22 MED ORDER — HALOPERIDOL 5 MG PO TABS
5.0000 mg | ORAL_TABLET | Freq: Two times a day (BID) | ORAL | Status: DC
  Administered 2020-09-22 – 2020-09-23 (×2): 5 mg via ORAL
  Filled 2020-09-22 (×5): qty 1

## 2020-09-22 NOTE — [facility] Group Notes (Signed)
[facility] LCSW Group Therapy  09/22/2020 2:12 PM  Type of Therapy:  Group Therapy  Participation Level:  Did Not Attend  Summary of Progress/Problems: Pt declined to attend group.  Evelena Masci A Ruthann Angulo 09/22/2020, 2:12 PM

## 2020-09-22 NOTE — Progress Notes (Signed)
[facility] MD Progress Note  09/22/2020 10:35 AM Lindsey Richmond  MRN:  [id number]  Principal Problem: Paranoid schizophrenia (HCC) Diagnosis: Principal Problem:   Paranoid schizophrenia (HCC)  Total Time spent with patient: 20 minutes    Daily Note:   Patient seen, chart reviewed, case discussed with treatment team.  Continues to appear psychotic with bizarre affect and is seen to be internally preoccupied.  Denies issues with sleep or appetite but is seen laughing inappropriately during course of evaluation.   Has been complaint with medications. No behavioral outbursts, but patient is showcasing psychotic and catatonic like behaviors throughout the day.     Past Medical History:  Past Medical History:  Diagnosis Date  . Anxiety   . Depression   . Oppositional defiant behavior   . Schizo affective schizophrenia (HCC)   . Schizophrenia (HCC)    History reviewed. No pertinent surgical history. Family History:  Family History  Problem Relation Age of Onset  . Heart failure Mother   . Hypertension Mother   . Diabetes Father     Social History:  Social History   Substance and Sexual Activity  Alcohol Use No     Social History   Substance and Sexual Activity  Drug Use No    Social History   Socioeconomic History  . Marital status: Single    Spouse name: Not on file  . Number of children: Not on file  . Years of education: Not on file  . Highest education level: Not on file  Occupational History  . Occupation: Unemployed  Tobacco Use  . Smoking status: Never Smoker  . Smokeless tobacco: Never Used  Vaping Use  . Vaping Use: Never used  Substance and Sexual Activity  . Alcohol use: No  . Drug use: No  . Sexual activity: Never    Birth control/protection: None  Other Topics Concern  . Not on file  Social History Narrative   ** Merged History Encounter **       Pt lives with her mother, brother, and father.  She receives outpatient psychiatric care through  [facility].   Social Determinants of Health   Financial Resource Strain: Not on file  Food Insecurity: Not on file  Transportation Needs: Not on file  Physical Activity: Not on file  Stress: Not on file  Social Connections: Not on file   Additional Social History:    Pain Medications: see MAR Prescriptions: see MAR Over the Counter: see MAR History of alcohol / drug use?: No history of alcohol / drug abuse Longest period of sobriety (when/how long): none Negative Consequences of Use: Financial Withdrawal Symptoms: Other (Comment) (none)                    Sleep: Fair  Appetite:  Fair  Current Medications: Current Facility-Administered Medications  Medication Dose Route Frequency Provider Last Rate Last Admin  . acetaminophen (TYLENOL) tablet 650 mg  650 mg Oral Q6H PRN Antonieta Pert, MD      . alum & mag hydroxide-simeth (MAALOX/MYLANTA) 200-200-20 MG/5ML suspension 30 mL  30 mL Oral Q4H PRN Antonieta Pert, MD      . cloNIDine (CATAPRES) tablet 0.1 mg  0.1 mg Oral BID Antonieta Pert, MD   0.1 mg at 09/22/20 0920  . cloNIDine (CATAPRES) tablet 0.1 mg  0.1 mg Oral TID PRN Antonieta Pert, MD      . haloperidol (HALDOL) tablet 5 mg  5 mg Oral BID Jeweldean Drohan, Worthy Rancher,  MD       Or  . haloperidol lactate (HALDOL) injection 5 mg  5 mg Intramuscular BID Zea Kostka A, MD      . LORazepam (ATIVAN) tablet 1 mg  1 mg Oral BID Antonieta Pert, MD   1 mg at 09/22/20 0919   Or  . LORazepam (ATIVAN) injection 1 mg  1 mg Intramuscular BID Antonieta Pert, MD      . magnesium hydroxide (MILK OF MAGNESIA) suspension 30 mL  30 mL Oral Daily PRN Antonieta Pert, MD      . paliperidone (INVEGA) 24 hr tablet 9 mg  9 mg Oral QHS Antonieta Pert, MD   9 mg at 09/21/20 2058  . traZODone (DESYREL) tablet 100 mg  100 mg Oral QHS Antonieta Pert, MD   100 mg at 09/21/20 2058    Lab Results: No results found for this or any previous visit (from the past 48  hour(s)).  Blood Alcohol level:  Lab Results  Component Value Date   ETH <10 09/09/2020   ETH <10 01/04/2020    Metabolic Disorder Labs: Lab Results  Component Value Date   HGBA1C 5.6 12/27/2019   MPG 114.02 12/27/2019   MPG 105.41 12/04/2019   Lab Results  Component Value Date   PROLACTIN 57.7 (H) 12/04/2019   PROLACTIN 75.7 (H) 10/23/2019   Lab Results  Component Value Date   CHOL 145 12/27/2019   TRIG 59 12/27/2019   HDL 24 (L) 12/27/2019   CHOLHDL 6.0 12/27/2019   VLDL 12 12/27/2019   LDLCALC 109 (H) 12/27/2019   LDLCALC 100 (H) 12/04/2019    Physical Findings: AIMS: Facial and Oral Movements Muscles of Facial Expression: None, normal Lips and Perioral Area: None, normal Jaw: None, normal Tongue: None, normal,Extremity Movements Upper (arms, wrists, hands, fingers): None, normal Lower (legs, knees, ankles, toes): None, normal, Trunk Movements Neck, shoulders, hips: None, normal, Overall Severity Severity of abnormal movements (highest score from questions above): None, normal Incapacitation due to abnormal movements: None, normal Patient's awareness of abnormal movements (rate only patient's report): No Awareness, Dental Status Current problems with teeth and/or dentures?: No Does patient usually wear dentures?: No  CIWA:  CIWA-Ar Total: 2 COWS:  COWS Total Score: 4  Musculoskeletal: Strength & Muscle Tone: within normal limits Gait & Station: normal Patient leans: N/A  Psychiatric Specialty Exam: Physical Exam  Review of Systems  Blood pressure 128/65, pulse (!) 115, temperature 98.3 F (36.8 C), temperature source Oral, resp. rate 20, height 5\' 4"  (1.626 m), weight 121.6 kg, last menstrual period 09/13/2020, SpO2 97 %.Body mass index is 46 kg/m.  General Appearance: Casual  Eye Contact:  Fair  Speech:  Blocked and Slow  Volume:  Decreased  Mood:  Euphoric  Affect:  Congruent and Inappropriate  Thought Process:  Coherent and Goal Directed   Orientation:  Full (Time, Place, and Person)  Thought Content:  Hallucinations: Auditory, Paranoid Ideation and Rumination  Suicidal Thoughts:  No  Homicidal Thoughts:  No  Memory:  Recent;   Poor  Judgement:  Poor  Insight:  Lacking  Psychomotor Activity:  Decreased  Concentration:  Concentration: Poor  Recall:  Poor  Fund of Knowledge:  Fair  Language:  Fair  Akathisia:  No  Handed:  Right  AIMS (if indicated):     Assets:  Desire for Improvement Physical Health Resilience  ADL's:  Impaired  Cognition:  Impaired,  Mild  Sleep:  Number of Hours: 7.5  Treatment Plan Summary: Daily contact with patient to assess and evaluate symptoms and progress in treatment and Medication management    Of note patient is currently taking two antipsychotics. Her level of psychosis warrants such as she has had only some relief on paliperidone. Haloperidol will be added to augment with hopes of being able to discontinue prior to discharge. However, at this time, patient remains quite ill and will necessitate dual therapy in order to achieve remission of symptoms.    Diagnosis: 1. Schizophrenia; catatonic type  Plan: 1.  Increae Haldol to 5 mg BID. This is for psychosis, catatonia and sleep. 2.  Continue lorazepam to 1 mg p.o. or IM twice daily for catatonia. 3.  Continue paliperidone 9 mg p.o. nightly for psychosis. 4.  Continue trazodone 100 mg p.o. nightly as needed insomnia. 5.  Patient received the paliperidone long-acting injection 156 mg IM 1 to 2 days prior to admission, and received an additional 156 mg on 09/17/2020.  This is for psychosis. 6.   clonidine 0.2 mg p.o. twice daily  7.  Disposition planning-in progress.   Clement Sayres, MD 09/22/2020, 10:35 AM

## 2020-09-22 NOTE — Progress Notes (Signed)
[facility] Group Notes:  (Nursing/MHT/Case Management/Adjunct)  Date:  09/22/2020  Time:  10:35 PM  Type of Therapy:  wrap up  Participation Level:  Did Not Attend   Lindsey Richmond 09/22/2020, 10:35 PM

## 2020-09-22 NOTE — Progress Notes (Signed)
Recreation Therapy Notes  Date: 12.22.21 Time: 1000 Location: [address] Dayroom  Group Topic: Arts and Crafts  Goal Area(s) Addresses:  Patient will participate in the creative process to complete all crafts.  Patient will interact pro-socially with staff and peers.  Patient will follow directions on the 1st prompt.   Behavioral Response: Engaged  Intervention: Tourist information centre manager- printed paper templates, safety scissors, glue, glitter, red/green/white construction paper, markers, colored pencils and chalk  Activity: LRT facilitated a therapeutic art activity to encourage self-expression and creativity in recognition of the approaching holidays. Patients had the options to cut out christmas trees, santa hat, tree ornaments, candy canes or free style and Christmas design they could come up with.  Patients were to then decorate their cut outs with the supplies provided.  Education: Socialization, Leisure Education  Education Outcome: Acknowledges understanding  Clinical Observations/Feedback: Pt was attentive and active.  Pt asked for help when needed.  Pt sang along to the Christmas music.  Pt was excited once she completed her project.   Caroll Rancher, LRT/CTRS         Lillia Abed, Bethann Qualley A 09/22/2020 1:10 PM

## 2020-09-22 NOTE — [facility] Group Notes (Signed)
Adult Psychoeducational Group Note  Date:  09/22/2020 Time:  4:48 PM  Group Topic/Focus:  Making Healthy Choices:   The focus of this group is to help patients identify negative/unhealthy choices they were using prior to admission and identify positive/healthier coping strategies to replace them upon discharge.  Participation Level:  Active  Participation Quality:  Appropriate and Attentive  Affect:  Appropriate  Cognitive:  Alert and Appropriate  Insight: Appropriate and Good  Engagement in Group:  Engaged  Modes of Intervention:  Discussion  Additional Comments:  Rn led a group focused on the importance medication compliance and symptom management. Pt says she learned a lot from the groups and she would like to attend the next group tomorrow.    Lindsey Richmond 09/22/2020, 4:48 PM

## 2020-09-22 NOTE — Progress Notes (Signed)
Pt observed in hall on floor by medication window on initial approach. Presents with flat affect, irritable mood and elective mutism. Refused to talk to writer on three separate attempts to come to medication window for her scheduled medications. However, pt brightened up this afternoon. Attended scheduled unit groups (nursing & Rec. Therapy) and was engaged. Denies SI, HI, AVH and pain.  Emotional support provided to pt this shift. Encouraged to comply with treatment regimen and to shower due to body. Medications given with verbal education and effects monitored. Safety checks maintained at Q 15 minutes intervals without self harm gestures. Pt did shower this shift. Visible in dayroom most of this shift. Denies discomfort with current medication regimen. Remains safe on and off unit.

## 2020-09-22 NOTE — [facility] Group Notes (Signed)
Adult Psychoeducational Group Note  Date:  09/22/2020 Time:  9:48 AM  Group Topic/Focus:  Goals Group:   The focus of this group is to help patients establish daily goals to achieve during treatment and discuss how the patient can incorporate goal setting into their daily lives to aide in recovery.  Participation Level:  Active  Participation Quality:  Appropriate  Affect:  Appropriate  Cognitive:  Alert and Appropriate  Insight: Appropriate and Good  Engagement in Group:  Engaged  Modes of Intervention:  Clarification and Discussion  Additional Comments:  Pt had a goal today of working towards discharge.She stated this means taking her meds, being visible in the milieu and requesting transportation once a d/c time is set-up. Pt is confident these can all be accomplished today.   Lindsey Richmond 09/22/2020, 9:48 AM

## 2020-09-23 MED ORDER — HALOPERIDOL LACTATE 5 MG/ML IJ SOLN
5.0000 mg | Freq: Two times a day (BID) | INTRAMUSCULAR | Status: DC
  Filled 2020-09-23 (×6): qty 1

## 2020-09-23 MED ORDER — HALOPERIDOL 5 MG PO TABS
7.0000 mg | ORAL_TABLET | Freq: Two times a day (BID) | ORAL | Status: DC
  Administered 2020-09-23 – 2020-09-24 (×2): 7 mg via ORAL
  Filled 2020-09-23 (×6): qty 1

## 2020-09-23 MED ORDER — LORAZEPAM 2 MG/ML IJ SOLN: 2.0000 mg | Freq: Two times a day (BID) | INTRAMUSCULAR | Status: DC

## 2020-09-23 MED ORDER — LORAZEPAM 1 MG PO TABS
2.0000 mg | ORAL_TABLET | Freq: Two times a day (BID) | ORAL | Status: DC
  Administered 2020-09-23 – 2020-09-24 (×2): 2 mg via ORAL
  Filled 2020-09-23 (×2): qty 2

## 2020-09-23 NOTE — Progress Notes (Addendum)
Patient ID: Lindsey Richmond, female   DOB: Mar 05, 1996, 24 y.o.   MRN: [id number]    [facility] MD Progress Note  09/23/2020 11:26 AM Lindsey Richmond  MRN:  [id number]  Principal Problem: Paranoid schizophrenia (HCC) Diagnosis: Principal Problem:   Paranoid schizophrenia (HCC)  Total Time spent with patient: 20 minutes    Daily Note:   Patient seen, chart reviewed, case discussed with treatment team.     Patient had a better day yesterday as she was seen attending groups and was more verbal, however last night had two different episodes of catatonic like behavior. She was also difficult to arouse in the morning.   Today patient asked about discharged but was again smiling and laughing inappropriately. Her speech was very slow and still seemed somewhat blocked.     Past Medical History:  Past Medical History:  Diagnosis Date  . Anxiety   . Depression   . Oppositional defiant behavior   . Schizo affective schizophrenia (HCC)   . Schizophrenia (HCC)    History reviewed. No pertinent surgical history. Family History:  Family History  Problem Relation Age of Onset  . Heart failure Mother   . Hypertension Mother   . Diabetes Father     Social History:  Social History   Substance and Sexual Activity  Alcohol Use No     Social History   Substance and Sexual Activity  Drug Use No    Social History   Socioeconomic History  . Marital status: Single    Spouse name: Not on file  . Number of children: Not on file  . Years of education: Not on file  . Highest education level: Not on file  Occupational History  . Occupation: Unemployed  Tobacco Use  . Smoking status: Never Smoker  . Smokeless tobacco: Never Used  Vaping Use  . Vaping Use: Never used  Substance and Sexual Activity  . Alcohol use: No  . Drug use: No  . Sexual activity: Never    Birth control/protection: None  Other Topics Concern  . Not on file  Social History Narrative   ** Merged History Encounter **        Pt lives with her mother, brother, and father.  She receives outpatient psychiatric care through [facility].   Social Determinants of Health   Financial Resource Strain: Not on file  Food Insecurity: Not on file  Transportation Needs: Not on file  Physical Activity: Not on file  Stress: Not on file  Social Connections: Not on file   Additional Social History:    Pain Medications: see MAR Prescriptions: see MAR Over the Counter: see MAR History of alcohol / drug use?: No history of alcohol / drug abuse Longest period of sobriety (when/how long): none Negative Consequences of Use: Financial Withdrawal Symptoms: Other (Comment) (none)                    Sleep: Fair  Appetite:  Fair  Current Medications: Current Facility-Administered Medications  Medication Dose Route Frequency Provider Last Rate Last Admin  . acetaminophen (TYLENOL) tablet 650 mg  650 mg Oral Q6H PRN Antonieta Pert, MD      . alum & mag hydroxide-simeth (MAALOX/MYLANTA) 200-200-20 MG/5ML suspension 30 mL  30 mL Oral Q4H PRN Antonieta Pert, MD      . cloNIDine (CATAPRES) tablet 0.1 mg  0.1 mg Oral BID Antonieta Pert, MD   0.1 mg at 09/23/20 0810  . cloNIDine (CATAPRES) tablet 0.1 mg  0.1 mg Oral TID PRN Antonieta Pert, MD      . haloperidol (HALDOL) tablet 5 mg  5 mg Oral BID Kayston Jodoin, Worthy Rancher, MD   5 mg at 09/23/20 4627   Or  . haloperidol lactate (HALDOL) injection 5 mg  5 mg Intramuscular BID Ramonda Galyon A, MD      . LORazepam (ATIVAN) tablet 1 mg  1 mg Oral BID Antonieta Pert, MD   1 mg at 09/23/20 0350   Or  . LORazepam (ATIVAN) injection 1 mg  1 mg Intramuscular BID Antonieta Pert, MD      . magnesium hydroxide (MILK OF MAGNESIA) suspension 30 mL  30 mL Oral Daily PRN Antonieta Pert, MD      . paliperidone (INVEGA) 24 hr tablet 9 mg  9 mg Oral QHS Antonieta Pert, MD   9 mg at 09/21/20 2058  . traZODone (DESYREL) tablet 100 mg  100 mg Oral QHS Antonieta Pert,  MD   100 mg at 09/21/20 2058    Lab Results: No results found for this or any previous visit (from the past 48 hour(s)).  Blood Alcohol level:  Lab Results  Component Value Date   ETH <10 09/09/2020   ETH <10 01/04/2020    Metabolic Disorder Labs: Lab Results  Component Value Date   HGBA1C 5.6 12/27/2019   MPG 114.02 12/27/2019   MPG 105.41 12/04/2019   Lab Results  Component Value Date   PROLACTIN 57.7 (H) 12/04/2019   PROLACTIN 75.7 (H) 10/23/2019   Lab Results  Component Value Date   CHOL 145 12/27/2019   TRIG 59 12/27/2019   HDL 24 (L) 12/27/2019   CHOLHDL 6.0 12/27/2019   VLDL 12 12/27/2019   LDLCALC 109 (H) 12/27/2019   LDLCALC 100 (H) 12/04/2019    Physical Findings: AIMS: Facial and Oral Movements Muscles of Facial Expression: None, normal Lips and Perioral Area: None, normal Jaw: None, normal Tongue: None, normal,Extremity Movements Upper (arms, wrists, hands, fingers): None, normal Lower (legs, knees, ankles, toes): None, normal, Trunk Movements Neck, shoulders, hips: None, normal, Overall Severity Severity of abnormal movements (highest score from questions above): None, normal Incapacitation due to abnormal movements: None, normal Patient's awareness of abnormal movements (rate only patient's report): No Awareness, Dental Status Current problems with teeth and/or dentures?: No Does patient usually wear dentures?: No  CIWA:  CIWA-Ar Total: 2 COWS:  COWS Total Score: 4  Musculoskeletal: Strength & Muscle Tone: within normal limits Gait & Station: normal Patient leans: N/A  Psychiatric Specialty Exam: Physical Exam  Review of Systems  Blood pressure 115/66, pulse (!) 105, temperature 97.8 F (36.6 C), temperature source Oral, resp. rate 20, height 5\' 4"  (1.626 m), weight 121.6 kg, last menstrual period 09/13/2020, SpO2 100 %.Body mass index is 46 kg/m.  General Appearance: Casual  Eye Contact:  Fair  Speech:  Blocked and Slow  Volume:   Decreased  Mood:  Euphoric  Affect:  Congruent and Inappropriate  Thought Process:  Coherent and Goal Directed  Orientation:  Full (Time, Place, and Person)  Thought Content:  Hallucinations: Auditory, Paranoid Ideation and Rumination  Suicidal Thoughts:  No  Homicidal Thoughts:  No  Memory:  Recent;   Poor  Judgement:  Poor  Insight:  Lacking  Psychomotor Activity:  Decreased  Concentration:  Concentration: Poor  Recall:  Poor  Fund of Knowledge:  Fair  Language:  Fair  Akathisia:  No  Handed:  Right  AIMS (if  indicated):     Assets:  Desire for Improvement Physical Health Resilience  ADL's:  Impaired  Cognition:  Impaired,  Mild  Sleep:  Number of Hours: 7.5     Treatment Plan Summary: Daily contact with patient to assess and evaluate symptoms and progress in treatment and Medication management    Of note patient is currently taking two antipsychotics. Her level of psychosis warrants such as she has had only some relief on paliperidone. Haloperidol will be added to augment with hopes of being able to discontinue prior to discharge. However, at this time, patient remains quite ill and will necessitate dual therapy in order to achieve remission of symptoms.    Diagnosis: 1. Schizophrenia; catatonic type  Plan: 1.  Increase Haldol to 7 mg BID. This is for psychosis, catatonia and sleep. 2.  increase lorazepam to 2 mg p.o. or IM twice daily for catatonia. 3.  Continue paliperidone 9 mg p.o. nightly for psychosis. 4.  Continue trazodone 100 mg p.o. nightly as needed insomnia. 5.  Patient received the paliperidone long-acting injection 156 mg IM 2 days prior to admission, and received an additional 156 mg on 09/17/2020.  This is for psychosis. 6.   clonidine 0.2 mg p.o. twice daily  7.  Disposition planning-in progress.   Clement Sayres, MD 09/23/2020, 11:26 AM

## 2020-09-23 NOTE — [facility] Group Notes (Signed)
The focus of this group is to help patients establish daily goals to achieve during treatment and discuss how the patient can incorporate goal setting into their daily lives to aide in recovery.Patient attended group. 

## 2020-09-23 NOTE — Progress Notes (Addendum)
Pt was not given nighttime medications. Pt asleep but too lethargic to sit up and take meds. Pt RR even and unlabored. Will continue to monitor.

## 2020-09-23 NOTE — [facility] Group Notes (Signed)
Occupational Therapy Group Note Date: 09/23/2020 Group Topic/Focus: Relaxation  Group Description: Group encouraged increased engagement and participation through a guided imagery mindfulness practice to promote relaxation and symptom management. Patients were encouraged to share their own relaxation strategies/practices and were then encouraged to follow along with the guided imagery script provided. Patients reported benefit and feeling more relaxed post group.   Therapeutic Goal(s): Identify personal relaxation strategies and techniques to reduce stress and emotional distress Participation Level: Patient did not attend OT group session despite personal invitation.   Plan: Continue to engage patient in OT groups 2 - 3x/week.  09/23/2020  Lindaann Gradilla, MOT, OTR/L 

## 2020-09-23 NOTE — Progress Notes (Addendum)
°   09/23/20 2110  Psych Admission Type (Psych Patients Only)  Admission Status Involuntary  Psychosocial Assessment  Patient Complaints None  Eye Contact Brief  Facial Expression Flat  Affect Blunted  Speech Logical/coherent;Slow  Interaction Assertive  Motor Activity Slow  Appearance/Hygiene Body odor;Disheveled;Poor hygiene  Behavior Characteristics Cooperative;Calm  Mood Pleasant  Thought Process  Coherency WDL  Content WDL  Delusions None reported or observed  Perception WDL  Hallucination None reported or observed  Judgment Limited  Confusion None  Danger to Self  Current suicidal ideation? Denies  Danger to Others  Danger to Others None reported or observed   Pt was seen in her room. Pt was easy to rouse and took all meds by mouth. Pt denies SI, HI, AVH and pain. Pt still disheveled with poor hygiene.

## 2020-09-24 MED ORDER — HALOPERIDOL 5 MG PO TABS: 5.0000 mg | ORAL_TABLET | Freq: Two times a day (BID) | ORAL | 0 refills | Status: DC

## 2020-09-24 MED ORDER — LORAZEPAM 1 MG PO TABS
1.0000 mg | ORAL_TABLET | Freq: Three times a day (TID) | ORAL | Status: DC
  Administered 2020-09-24: 12:00:00 1 mg via ORAL
  Filled 2020-09-24: qty 1

## 2020-09-24 MED ORDER — INVEGA SUSTENNA 156 MG/ML IM SUSY
156.0000 mg | PREFILLED_SYRINGE | INTRAMUSCULAR | 1 refills | Status: DC
Start: 2020-09-24 — End: 2020-10-06

## 2020-09-24 MED ORDER — TRAZODONE HCL 100 MG PO TABS: 100.0000 mg | ORAL_TABLET | Freq: Every day | ORAL | 0 refills | Status: DC

## 2020-09-24 MED ORDER — LISINOPRIL 5 MG PO TABS
5.0000 mg | ORAL_TABLET | Freq: Every day | ORAL | Status: DC
  Filled 2020-09-24 (×2): qty 1

## 2020-09-24 MED ORDER — CLONIDINE HCL 0.1 MG PO TABS: 0.1000 mg | ORAL_TABLET | Freq: Two times a day (BID) | ORAL | 0 refills | Status: DC

## 2020-09-24 MED ORDER — LORAZEPAM 1 MG PO TABS: 1.0000 mg | ORAL_TABLET | Freq: Three times a day (TID) | ORAL | 0 refills | Status: DC

## 2020-09-24 MED ORDER — PALIPERIDONE ER 9 MG PO TB24: 9.0000 mg | ORAL_TABLET | Freq: Every day | ORAL | 0 refills | Status: DC

## 2020-09-24 NOTE — Progress Notes (Signed)
Pt discharged to lobby. Pt was stable and appreciative at that time. All papers and prescriptions were given and valuables returned. Verbal understanding expressed. Denies SI/HI and A/VH. Pt given opportunity to express concerns and ask questions.  

## 2020-09-24 NOTE — Tx Team (Signed)
Interdisciplinary Treatment and Diagnostic Plan Update  09/24/2020 Time of Session: 9:25am Lindsey Richmond MRN: [id number]  Principal Diagnosis: Paranoid schizophrenia [facility])  Secondary Diagnoses: Principal Problem:   Paranoid schizophrenia (HCC)   Current Medications:  Current Facility-Administered Medications  Medication Dose Route Frequency Provider Last Rate Last Admin  . acetaminophen (TYLENOL) tablet 650 mg  650 mg Oral Q6H PRN Antonieta Pert, MD      . alum & mag hydroxide-simeth (MAALOX/MYLANTA) 200-200-20 MG/5ML suspension 30 mL  30 mL Oral Q4H PRN Antonieta Pert, MD      . cloNIDine (CATAPRES) tablet 0.1 mg  0.1 mg Oral BID Antonieta Pert, MD   0.1 mg at 09/24/20 0809  . cloNIDine (CATAPRES) tablet 0.1 mg  0.1 mg Oral TID PRN Antonieta Pert, MD      . haloperidol (HALDOL) tablet 7 mg  7 mg Oral BID Cristofano, Worthy Rancher, MD   7 mg at 09/24/20 0809   Or  . haloperidol lactate (HALDOL) injection 5 mg  5 mg Intramuscular BID Cristofano, Paul A, MD      . LORazepam (ATIVAN) tablet 2 mg  2 mg Oral BID Cristofano, Worthy Rancher, MD   2 mg at 09/24/20 0809   Or  . LORazepam (ATIVAN) injection 2 mg  2 mg Intramuscular BID Cristofano, Paul A, MD      . magnesium hydroxide (MILK OF MAGNESIA) suspension 30 mL  30 mL Oral Daily PRN Antonieta Pert, MD      . paliperidone (INVEGA) 24 hr tablet 9 mg  9 mg Oral QHS Antonieta Pert, MD   9 mg at 09/23/20 2102  . traZODone (DESYREL) tablet 100 mg  100 mg Oral QHS Antonieta Pert, MD   100 mg at 09/23/20 2102   PTA Medications: Medications Prior to Admission  Medication Sig Dispense Refill Last Dose  . benztropine (COGENTIN) 0.5 MG tablet Take 1 tablet (0.5 mg total) by mouth 2 (two) times daily as needed for tremors (EPS). 60 tablet 2   . cloNIDine (CATAPRES) 0.2 MG tablet Take 1 tablet (0.2 mg total) by mouth 2 (two) times daily. 60 tablet 2   . paliperidone (INVEGA SUSTENNA) 156 MG/ML SUSY injection Inject 1 mL (156 mg total)  into the muscle every 30 (thirty) days. 1.2 mL 11   . risperiDONE (RISPERDAL) 3 MG tablet Take 2 tablets (6 mg total) by mouth at bedtime. 60 tablet 3   . traZODone (DESYREL) 150 MG tablet Take 150 mg by mouth at bedtime.       Patient Stressors: Financial difficulties Marital or family conflict Medication change or noncompliance  Patient Strengths: Motivation for treatment/growth Physical Health Supportive family/friends  Treatment Modalities: Medication Management, Group therapy, Case management,  1 to 1 session with clinician, Psychoeducation, Recreational therapy.   Physician Treatment Plan for Primary Diagnosis: Paranoid schizophrenia (HCC) Long Term Goal(s): Improvement in symptoms so as ready for discharge Improvement in symptoms so as ready for discharge   Short Term Goals: Ability to identify changes in lifestyle to reduce recurrence of condition will improve Ability to verbalize feelings will improve Ability to demonstrate self-control will improve Ability to identify and develop effective coping behaviors will improve Ability to maintain clinical measurements within normal limits will improve Compliance with prescribed medications will improve Ability to identify changes in lifestyle to reduce recurrence of condition will improve Ability to verbalize feelings will improve Ability to demonstrate self-control will improve Ability to identify and develop effective coping behaviors  will improve Ability to maintain clinical measurements within normal limits will improve Compliance with prescribed medications will improve  Medication Management: Evaluate patient's response, side effects, and tolerance of medication regimen.  Therapeutic Interventions: 1 to 1 sessions, Unit Group sessions and Medication administration.  Evaluation of Outcomes: Progressing  Physician Treatment Plan for Secondary Diagnosis: Principal Problem:   Paranoid schizophrenia (HCC)  Long Term  Goal(s): Improvement in symptoms so as ready for discharge Improvement in symptoms so as ready for discharge   Short Term Goals: Ability to identify changes in lifestyle to reduce recurrence of condition will improve Ability to verbalize feelings will improve Ability to demonstrate self-control will improve Ability to identify and develop effective coping behaviors will improve Ability to maintain clinical measurements within normal limits will improve Compliance with prescribed medications will improve Ability to identify changes in lifestyle to reduce recurrence of condition will improve Ability to verbalize feelings will improve Ability to demonstrate self-control will improve Ability to identify and develop effective coping behaviors will improve Ability to maintain clinical measurements within normal limits will improve Compliance with prescribed medications will improve     Medication Management: Evaluate patient's response, side effects, and tolerance of medication regimen.  Therapeutic Interventions: 1 to 1 sessions, Unit Group sessions and Medication administration.  Evaluation of Outcomes: Progressing   RN Treatment Plan for Primary Diagnosis: Paranoid schizophrenia (HCC) Long Term Goal(s): Knowledge of disease and therapeutic regimen to maintain health will improve  Short Term Goals: Ability to remain free from injury will improve, Ability to verbalize frustration and anger appropriately will improve, Ability to identify and develop effective coping behaviors will improve and Compliance with prescribed medications will improve  Medication Management: RN will administer medications as ordered by provider, will assess and evaluate patient's response and provide education to patient for prescribed medication. RN will report any adverse and/or side effects to prescribing provider.  Therapeutic Interventions: 1 on 1 counseling sessions, Psychoeducation, Medication administration,  Evaluate responses to treatment, Monitor vital signs and CBGs as ordered, Perform/monitor CIWA, COWS, AIMS and Fall Risk screenings as ordered, Perform wound care treatments as ordered.  Evaluation of Outcomes: Progressing   LCSW Treatment Plan for Primary Diagnosis: Paranoid schizophrenia (HCC) Long Term Goal(s): Safe transition to appropriate next level of care at discharge, Engage patient in therapeutic group addressing interpersonal concerns.  Short Term Goals: Engage patient in aftercare planning with referrals and resources, Increase social support, Identify triggers associated with mental health/substance abuse issues and Increase skills for wellness and recovery  Therapeutic Interventions: Assess for all discharge needs, 1 to 1 time with Social worker, Explore available resources and support systems, Assess for adequacy in community support network, Educate family and significant other(s) on suicide prevention, Complete Psychosocial Assessment, Interpersonal group therapy.  Evaluation of Outcomes: Progressing   Progress in Treatment: Attending groups: No. Participating in groups: No. Taking medication as prescribed: No. Toleration medication: No. Family/Significant other contact made: No, will contact:  patient declined consents Patient understands diagnosis: No. Discussing patient identified problems/goals with staff: No. Medical problems stabilized or resolved: Yes. Denies suicidal/homicidal ideation: Yes. Issues/concerns per patient self-inventory: No.   New problem(s) identified: Yes, Describe:  Patient currently non-verbal and catotanic.   New Short Term/Long Term Goal(s): medication stabilization, elimination of SI thoughts, development of comprehensive mental wellness plan.   Patient Goals:  Patient unable to participate in meeting due to being non-verbal at this time.  Discharge Plan or Barriers: Patient established with [facility] for medication management. Patient is  able to return to live with mother post discharge.  Reason for Continuation of Hospitalization: Delusions  Hallucinations Medication stabilization  Estimated Length of Stay: 3-5 days  Attendees: Patient:  09/15/2020   Physician:  09/15/2020   Nursing:  09/15/2020   RN Care Manager: 09/15/2020  Social Worker: Jacinta Shoe, LCSW 09/15/2020   Recreational Therapist:  09/15/2020   Other:  09/15/2020  Other:  09/15/2020   Other: 09/15/2020       Scribe for Treatment Team: Jacinta Shoe, LCSW 09/24/2020 10:39 AM

## 2020-09-24 NOTE — Progress Notes (Signed)
  [facility] Adult Case Management Discharge Plan :  Will you be returning to the same living situation after discharge:  Yes,  homw with mother At discharge, do you have transportation home?: No. Safe Transport to be arranged Do you have the ability to pay for your medications: Yes,  has insurance  Release of information consent forms completed and in the chart;  Patient's signature needed at discharge.  Patient to Follow up at:  Follow-up Information    [facility]. Go on 10/12/2020.   Why: You have an appointment with this provider on 10/12/20 at 1:00 pm for therapy and medication management services. This appointment will be held in person.  Please bring your photo ID, insurance card and wear a mask to this appointment.  Contact information: [address]  [address] [place] [address] [phone]               Next level of care provider has access to [facility] Link:no  Safety Planning and Suicide Prevention discussed: Yes,  with patient  Have you used any form of tobacco in the last 30 days? (Cigarettes, Smokeless Tobacco, Cigars, and/or Pipes): No  Has patient been referred to the Quitline?: N/A patient is not a smoker  Patient has been referred for addiction treatment: N/A  Otelia Santee, LCSW 09/24/2020, 12:10 PM

## 2020-09-24 NOTE — Discharge Summary (Signed)
Physician Discharge Summary Note  Patient:  Lindsey Richmond is an 24 y.o., female MRN:  [id number] DOB:  05-20-1996 Patient phone:  [phone] (home)  Patient address:   [address] [place] [address],  Total Time spent with patient: 30 minutes  Date of Admission:  09/13/2020 Date of Discharge: 09/24/2020  Reason for Admission: Catatonic schizophrenia  Principal Problem: Paranoid schizophrenia [facility]) Discharge Diagnoses: Principal Problem:   Paranoid schizophrenia (HCC)   Past Psychiatric History: See admission H&P  Past Medical History:  Past Medical History:  Diagnosis Date  . Anxiety   . Depression   . Oppositional defiant behavior   . Schizo affective schizophrenia (HCC)   . Schizophrenia (HCC)    History reviewed. No pertinent surgical history. Family History:  Family History  Problem Relation Age of Onset  . Heart failure Mother   . Hypertension Mother   . Diabetes Father    Family Psychiatric  History: See admission H&P Social History:  Social History   Substance and Sexual Activity  Alcohol Use No     Social History   Substance and Sexual Activity  Drug Use No    Social History   Socioeconomic History  . Marital status: Single    Spouse name: Not on file  . Number of children: Not on file  . Years of education: Not on file  . Highest education level: Not on file  Occupational History  . Occupation: Unemployed  Tobacco Use  . Smoking status: Never Smoker  . Smokeless tobacco: Never Used  Vaping Use  . Vaping Use: Never used  Substance and Sexual Activity  . Alcohol use: No  . Drug use: No  . Sexual activity: Never    Birth control/protection: None  Other Topics Concern  . Not on file  Social History Narrative   ** Merged History Encounter **       Pt lives with her mother, brother, and father.  She receives outpatient psychiatric care through [facility].   Social Determinants of Health   Financial Resource Strain: Not on file   Food Insecurity: Not on file  Transportation Needs: Not on file  Physical Activity: Not on file  Stress: Not on file  Social Connections: Not on file    Hospital Course:  Patient is a 24 year old female with a past psychiatric history significant for schizophrenia who originally presented to the [facility] emergency department on 09/09/2020. She was brought there by EMS. The patient had apparently been off her psychiatric medications for an unknown specified amount of time. She was essentially nonverbal. She apparently was very agitated in the emergency department, and attempted to leave the emergency department. She was placed under involuntary commitment by the police. She received intramuscular Ativan as well as Geodon secondary to her agitation. She was noted to be sedated on initial evaluation. Collateral information in the emergency department noted that the patient had stood in her garage staring at a wall for an hour. The patient apparently did not respond to her mother's request. The patient had poor hygiene, and her mother had to place shoes on her feet. The mother believes she had not been taking her oral medications consistently. She apparently goes to [facility] for outpatient treatment, and it is thought that she had received her paliperidone long-acting injection a month prior to evaluation. Upon further questioning by the consultation staff the mother believe that it may have been September when she last received her paliperidone injection. The consultative service placed her  on Cogentin Risperdal and trazodone. She was reconsulted on 09/11/2020. She was more verbal and there was some improvement. A follow-up consultation was done on 09/12/2020. She still did not respond in an appropriate manner and appeared somewhat catatonic. She apparently received the paliperidone injection 156 mg IM x1 on 12/12. Despite this she appeared to be psychotic, and the  decision was made to admit her to the hospital for evaluation and stabilization. Other complaints include not eating, not drinking, and poor hygiene. On examination today she did admit to auditory hallucinations. She was somewhat redirectable.  She continued initially to be quite catatonic.  Her Haldol dosage was increased as well as the lorazepam dosage.  Things improved partially.  The decision was made on 09/19/2020 to give an additional Invega 156 mg IM dosage.  Additionally during the rest of the hospitalization her Haldol dose was adjusted, and at discharge she was on 5 mg p.o. twice daily and her lorazepam dose was 1 mg p.o. 3 times daily.  On 12/24 it was felt she was near her baseline.  I spoke with her mother who agreed with this.  We discussed her medications with the hope that the Haldol and Ativan would be able to be weaned off sometime after the hospitalization.  I also recommended that she see her family practice or primary care provider to assess her hypertension.  I explained to the mother that the clonidine is a fine medicine in the short run, but needed to be on more specific treatment for this.  At time of discharge she denied any auditory or visual hallucinations.  She denied any suicidal or homicidal ideation.  She was able to engage and smile appropriately.  It was decided that she could be discharged home this date.  Physical Findings: AIMS: Facial and Oral Movements Muscles of Facial Expression: None, normal Lips and Perioral Area: None, normal Jaw: None, normal Tongue: None, normal,Extremity Movements Upper (arms, wrists, hands, fingers): None, normal Lower (legs, knees, ankles, toes): None, normal, Trunk Movements Neck, shoulders, hips: None, normal, Overall Severity Severity of abnormal movements (highest score from questions above): None, normal Incapacitation due to abnormal movements: None, normal Patient's awareness of abnormal movements (rate only patient's report):  No Awareness, Dental Status Current problems with teeth and/or dentures?: No Does patient usually wear dentures?: No  CIWA:  CIWA-Ar Total: 2 COWS:  COWS Total Score: 4  Musculoskeletal: Strength & Muscle Tone: within normal limits Gait & Station: normal Patient leans: N/A  Psychiatric Specialty Exam: Physical Exam Vitals and nursing note reviewed.  Constitutional:      Appearance: She is obese.  HENT:     Head: Normocephalic and atraumatic.  Pulmonary:     Effort: Pulmonary effort is normal.  Neurological:     General: No focal deficit present.     Mental Status: She is alert and oriented to person, place, and time.     Review of Systems  Blood pressure (!) 120/103, pulse (!) 123, temperature 98.5 F (36.9 C), temperature source Oral, resp. rate 20, height 5\' 4"  (1.626 m), weight 121.6 kg, last menstrual period 09/13/2020, SpO2 98 %.Body mass index is 46 kg/m.  General Appearance: Casual  Eye Contact:  Fair  Speech:  Normal Rate  Volume:  Normal  Mood:  Euthymic  Affect:  Flat  Thought Process:  Coherent and Descriptions of Associations: Intact  Orientation:  Full (Time, Place, and Person)  Thought Content:  Rumination  Suicidal Thoughts:  No  Homicidal Thoughts:  No  Memory:  Immediate;   Fair Recent;   Fair Remote;   Fair  Judgement:  Intact  Insight:  Fair  Psychomotor Activity:  Normal  Concentration:  Concentration: Fair and Attention Span: Fair  Recall:  Fair  Fund of Knowledge:  Good  Language:  Good  Akathisia:  Negative  Handed:  Right  AIMS (if indicated):     Assets:  Desire for Improvement Housing Resilience Social Support  ADL's:  Intact  Cognition:  WNL  Sleep:  Number of Hours: 7.5     Have you used any form of tobacco in the last 30 days? (Cigarettes, Smokeless Tobacco, Cigars, and/or Pipes): No  Has this patient used any form of tobacco in the last 30 days? (Cigarettes, Smokeless Tobacco, Cigars, and/or Pipes) Yes, No  Blood Alcohol  level:  Lab Results  Component Value Date   ETH <10 09/09/2020   ETH <10 01/04/2020    Metabolic Disorder Labs:  Lab Results  Component Value Date   HGBA1C 5.6 12/27/2019   MPG 114.02 12/27/2019   MPG 105.41 12/04/2019   Lab Results  Component Value Date   PROLACTIN 57.7 (H) 12/04/2019   PROLACTIN 75.7 (H) 10/23/2019   Lab Results  Component Value Date   CHOL 145 12/27/2019   TRIG 59 12/27/2019   HDL 24 (L) 12/27/2019   CHOLHDL 6.0 12/27/2019   VLDL 12 12/27/2019   LDLCALC 109 (H) 12/27/2019   LDLCALC 100 (H) 12/04/2019    See Psychiatric Specialty Exam and Suicide Risk Assessment completed by Attending Physician prior to discharge.  Discharge destination:  Home  Is patient on multiple antipsychotic therapies at discharge:  Yes,   Do you recommend tapering to monotherapy for antipsychotics?  Yes   Has Patient had three or more failed trials of antipsychotic monotherapy by history:  Yes,   Antipsychotic medications that previously failed include:   1.  Paliperidone alone., 2.  Haldol alone. and 3.  Risperdal alone.  Recommended Plan for Multiple Antipsychotic Therapies: Taper to monotherapy as described:  Attempt to wean off of Haldol and continue paliperidone  Discharge Instructions    Diet - low sodium heart healthy   Complete by: As directed    Increase activity slowly   Complete by: As directed      Allergies as of 09/24/2020   No Known Allergies     Medication List    STOP taking these medications   benztropine 0.5 MG tablet Commonly known as: COGENTIN   risperiDONE 3 MG tablet Commonly known as: RISPERDAL     TAKE these medications     Indication  cloNIDine 0.1 MG tablet Commonly known as: CATAPRES Take 1 tablet (0.1 mg total) by mouth 2 (two) times daily. What changed:   medication strength  how much to take  Indication: Hypertensive Urgency   haloperidol 5 MG tablet Commonly known as: HALDOL Take 1 tablet (5 mg total) by mouth 2 (two)  times daily.  Indication: Psychosis   Invega Sustenna 156 MG/ML Susy injection Generic drug: paliperidone Inject 1 mL (156 mg total) into the muscle every 30 (thirty) days.  Indication: Schizoaffective Disorder, To be given 10/20/20   LORazepam 1 MG tablet Commonly known as: ATIVAN Take 1 tablet (1 mg total) by mouth 3 (three) times daily.  Indication: Catatonia   paliperidone 9 MG 24 hr tablet Commonly known as: INVEGA Take 1 tablet (9 mg total) by mouth at bedtime.  Indication: Schizophrenia   traZODone 100 MG tablet  Commonly known as: DESYREL Take 1 tablet (100 mg total) by mouth at bedtime. What changed:   medication strength  how much to take  Indication: Trouble Sleeping       Follow-up Information    [facility]. Go on 10/12/2020.   Why: You have an appointment with this provider on 10/12/20 at 1:00 pm for therapy and medication management services. This appointment will be held in person.  Please bring your photo ID, insurance card and wear a mask to this appointment.  Contact information: [address]  [address] [place] [address] [phone]               Follow-up recommendations:  Activity:  Ad lib. Other:  Patient needs to follow-up with primary care provider with regard to blood pressure.  Patient will be followed by ACTT service as well as an outpatient.  Comments: Follow-up with both psychiatric and medical follow-up appointments.  Take medications as directed.  Signed: Antonieta Pert, MD 09/24/2020, 3:07 PM

## 2020-09-24 NOTE — BHH Suicide Risk Assessment (Signed)
[facility] Discharge Suicide Risk Assessment   Principal Problem: Paranoid schizophrenia [facility]) Discharge Diagnoses: Principal Problem:   Paranoid schizophrenia (HCC)   Total Time spent with patient: 30 minutes  Musculoskeletal: Strength & Muscle Tone: within normal limits Gait & Station: normal Patient leans: N/A  Psychiatric Specialty Exam: Review of Systems  All other systems reviewed and are negative.   Blood pressure (!) 120/103, pulse (!) 123, temperature 98.5 F (36.9 C), temperature source Oral, resp. rate 20, height 5\' 4"  (1.626 m), weight 121.6 kg, last menstrual period 09/13/2020, SpO2 98 %.Body mass index is 46 kg/m.  General Appearance: Casual  Eye Contact::  Fair  Speech:  Normal Rate409  Volume:  Normal  Mood:  Euthymic  Affect:  Flat  Thought Process:  Coherent and Descriptions of Associations: Intact  Orientation:  Full (Time, Place, and Person)  Thought Content:  Delusions  Suicidal Thoughts:  No  Homicidal Thoughts:  No  Memory:  Immediate;   Fair Recent;   Fair Remote;   Fair  Judgement:  Intact  Insight:  Fair  Psychomotor Activity:  Decreased  Concentration:  Fair  Recall:  [id number] of Knowledge:Fair  Language: Fair  Akathisia:  Negative  Handed:  Right  AIMS (if indicated):     Assets:  Desire for Improvement Housing Resilience Social Support  Sleep:  Number of Hours: 7.5  Cognition: WNL  ADL's:  Intact   Mental Status Per Nursing Assessment::   On Admission:  NA  Demographic Factors:  Low socioeconomic status and Unemployed  Loss Factors: NA  Historical Factors: Impulsivity  Risk Reduction Factors:   Living with another person, especially a relative, Positive social support and Positive therapeutic relationship  Continued Clinical Symptoms:  Schizophrenia:   Less than 33 years old Paranoid or undifferentiated type  Cognitive Features That Contribute To Risk:  None    Suicide Risk:  Minimal: No identifiable suicidal  ideation.  Patients presenting with no risk factors but with morbid ruminations; may be classified as minimal risk based on the severity of the depressive symptoms   Follow-up Information    [facility]. Go on 10/12/2020.   Why: You have an appointment with this provider on 10/12/20 at 1:00 pm for therapy and medication management services. This appointment will be held in person.  Please bring your photo ID, insurance card and wear a mask to this appointment.  Contact information: [address]  [address] [place] [phone]               Plan Of Care/Follow-up recommendations:  Activity:  ad lib  [phone], MD 09/24/2020, 12:03 PM

## 2020-09-27 ENCOUNTER — Encounter (HOSPITAL_COMMUNITY): Payer: Self-pay

## 2020-09-27 ENCOUNTER — Emergency Department (HOSPITAL_COMMUNITY)
Admission: EM | Admit: 2020-09-27 | Discharge: 2020-09-28 | Disposition: A | Discharge status: Home / Self Care | Payer: Medicaid Other | Attending: Emergency Medicine | Admitting: Emergency Medicine

## 2020-09-27 DIAGNOSIS — F202 Catatonic schizophrenia: Secondary | ICD-10-CM | POA: Insufficient documentation

## 2020-09-27 DIAGNOSIS — Z20822 Contact with and (suspected) exposure to covid-19: Secondary | ICD-10-CM | POA: Diagnosis not present

## 2020-09-27 DIAGNOSIS — Z79899 Other long term (current) drug therapy: Secondary | ICD-10-CM | POA: Insufficient documentation

## 2020-09-27 LAB — COMPREHENSIVE METABOLIC PANEL
ALT: 30 U/L (ref 0–44)
AST: 23 U/L (ref 15–41)
Albumin: 4.3 g/dL (ref 3.5–5.0)
Alkaline Phosphatase: 54 U/L (ref 38–126)
Anion gap: 12 (ref 5–15)
BUN: 18 mg/dL (ref 6–20)
CO2: 25 mmol/L (ref 22–32)
Calcium: 9.9 mg/dL (ref 8.9–10.3)
Chloride: 99 mmol/L (ref 98–111)
Creatinine, Ser: 1.15 mg/dL — ABNORMAL HIGH (ref 0.44–1.00)
GFR, Estimated: >60 mL/min (ref >60)
Glucose, Bld: 113 mg/dL — ABNORMAL HIGH (ref 70–99)
Potassium: 4.3 mmol/L (ref 3.5–5.1)
Sodium: 136 mmol/L (ref 135–145)
Total Bilirubin: 0.9 mg/dL (ref 0.3–1.2)
Total Protein: 10 g/dL — ABNORMAL HIGH (ref 6.5–8.1)

## 2020-09-27 LAB — I-STAT BETA HCG BLOOD, ED (MC, WL, AP ONLY): I-stat hCG, quantitative: <5 m[IU]/mL (ref <5)

## 2020-09-27 LAB — ETHANOL: Alcohol, Ethyl (B): <10 mg/dL (ref <10)

## 2020-09-27 LAB — CBC WITH DIFFERENTIAL/PLATELET
Abs Immature Granulocytes: 0.05 10*3/uL (ref 0.00–0.07)
Basophils Absolute: 0 10*3/uL (ref 0.0–0.1)
Basophils Relative: 0 %
Eosinophils Absolute: 0.1 10*3/uL (ref 0.0–0.5)
Eosinophils Relative: 1 %
HCT: 40.9 % (ref 36.0–46.0)
Hemoglobin: 13.4 g/dL (ref 12.0–15.0)
Immature Granulocytes: 1 %
Lymphocytes Relative: 14 %
Lymphs Abs: 1.5 10*3/uL (ref 0.7–4.0)
MCH: 30.7 pg (ref 26.0–34.0)
MCHC: 32.8 g/dL (ref 30.0–36.0)
MCV: 93.6 fL (ref 80.0–100.0)
Monocytes Absolute: 1 10*3/uL (ref 0.1–1.0)
Monocytes Relative: 9 %
Neutro Abs: 8.2 10*3/uL — ABNORMAL HIGH (ref 1.7–7.7)
Neutrophils Relative %: 75 %
Platelets: 218 10*3/uL (ref 150–400)
RBC: 4.37 MIL/uL (ref 3.87–5.11)
RDW: 13.4 % (ref 11.5–15.5)
WBC: 10.9 10*3/uL — ABNORMAL HIGH (ref 4.0–10.5)
nRBC: 0 % (ref 0.0–0.2)

## 2020-09-27 LAB — RESP PANEL BY RT-PCR (FLU A&B, COVID) ARPGX2
Influenza A by PCR: NEGATIVE
Influenza B by PCR: NEGATIVE
SARS Coronavirus 2 by RT PCR: NEGATIVE

## 2020-09-27 LAB — RAPID URINE DRUG SCREEN, HOSP PERFORMED
Amphetamines: NOT DETECTED
Barbiturates: NOT DETECTED
Benzodiazepines: POSITIVE — AB
Cocaine: NOT DETECTED
Opiates: NOT DETECTED
Tetrahydrocannabinol: NOT DETECTED

## 2020-09-27 MED ORDER — LORAZEPAM 1 MG PO TABS: 1.0000 mg | ORAL_TABLET | Freq: Once | ORAL | Status: DC

## 2020-09-27 MED ORDER — OLANZAPINE 10 MG PO TBDP
10.0000 mg | ORAL_TABLET | Freq: Every day | ORAL | Status: DC
  Administered 2020-09-27: 23:00:00 10 mg via ORAL
  Filled 2020-09-27: qty 1

## 2020-09-27 MED ORDER — LORAZEPAM 2 MG/ML IJ SOLN
2.0000 mg | Freq: Once | INTRAMUSCULAR | Status: AC
  Administered 2020-09-27: 2 mg via INTRAMUSCULAR
  Filled 2020-09-27: qty 1

## 2020-09-27 MED ORDER — ZIPRASIDONE MESYLATE 20 MG IM SOLR: 20.0000 mg | Freq: Once | INTRAMUSCULAR | Status: DC

## 2020-09-27 NOTE — ED Notes (Signed)
Attempted to wake pt several times for a urine sample, just moans and turns her head, no participating in care.

## 2020-09-27 NOTE — Care Management (Signed)
°  Writer referred patient to the following facilities:   [facility] Details Fax  [address], [place] [address]  Internal comment [facility] Details Fax  [address], [place] [address]  Internal comment [facility] Details Fax  [address] [place], [place] [address]  Internal comment [facility] Details Fax  [address], [place] [address]  Internal comment [facility] Details Fax  [address] [place] [address]  Internal comment [facility] Details Fax  [address] [place] [address]  Internal comment [facility] Details Fax  [address] [place] [address]  Internal comment [facility] Details Fax  [address] [place] [address]  Internal comment [facility] Details Fax  [address] [name] [place] [address]  Internal comment [facility] Details Fax  [address], [place] [address]  Internal comment [facility] Office Details Fax  [address] [place] [address]  Internal comment [facility] Details Fax  [address] [place], [place] [address]  Internal comment

## 2020-09-27 NOTE — ED Notes (Signed)
Contact number for pt's brotherKarolyne Richmond [phone].

## 2020-09-27 NOTE — ED Provider Notes (Addendum)
[facility]-EMERGENCY DEPT Provider Note   CSN: [id number] Arrival date & time: 09/27/20  4098     History Chief Complaint  Patient presents with  . Medical Clearance    Lindsey Richmond is a 24 y.o. female with PMH of schizophrenia with recent psychiatric admission 09/09/2020 after she was brought into the ED by EMS for medication noncompliance and catatonic behavior who returns to the ED via EMS.  She was discharged following her psychiatric admission on 09/24/2020.  According to prior notes, patient is seen by [facility] on outpatient basis and treated with paliperidone long-acting injections, most recently 09/12/2020 during last hospitalization.    On my examination, patient states that she came here to the ED today because her parents are not being nice to her.  She states that she is no longer followed by [facility] on an outpatient basis because she does not like taking her medications.  She is wearing a T-shirt that is inside out on my examination and bloodstained.  I cannot see any obvious injuries and she denies any recent falls or other trauma.  She states that her legs "hurt" but she is able to demonstrate full range of motion and strength.  She denies any other symptoms.  She denies any SI/HI/AVH.  She does appear to be catatonic on my examination.  I tried to call her brother [name] at the number listed, but it went straight to voicemail.  HPI     Past Medical History:  Diagnosis Date  . Anxiety   . Depression   . Oppositional defiant behavior   . Schizo affective schizophrenia (HCC)   . Schizophrenia [facility])     Patient Active Problem List   Diagnosis Date Noted  . Schizoaffective disorder, bipolar type (HCC)   . Schizo-affective schizophrenia (HCC) 12/25/2019  . Schizophrenia (HCC) 12/25/2019  . Schizophrenia (HCC) 01/15/2019  . Paranoid schizophrenia (HCC) 01/14/2019  . Schizophrenia, paranoid (HCC) 04/20/2017    History reviewed. No pertinent  surgical history.   OB History   No obstetric history on file.     Family History  Problem Relation Age of Onset  . Heart failure Mother   . Hypertension Mother   . Diabetes Father     Social History   Tobacco Use  . Smoking status: Never Smoker  . Smokeless tobacco: Never Used  Vaping Use  . Vaping Use: Never used  Substance Use Topics  . Alcohol use: No  . Drug use: No    Home Medications Prior to Admission medications   Medication Sig Start Date End Date Taking? Authorizing Provider  cloNIDine (CATAPRES) 0.1 MG tablet Take 1 tablet (0.1 mg total) by mouth 2 (two) times daily. 09/24/20   Antonieta Pert, MD  haloperidol (HALDOL) 5 MG tablet Take 1 tablet (5 mg total) by mouth 2 (two) times daily. 09/24/20   Antonieta Pert, MD  LORazepam (ATIVAN) 1 MG tablet Take 1 tablet (1 mg total) by mouth 3 (three) times daily. 09/24/20   Antonieta Pert, MD  paliperidone (INVEGA SUSTENNA) 156 MG/ML SUSY injection Inject 1 mL (156 mg total) into the muscle every 30 (thirty) days. 09/24/20   Antonieta Pert, MD  paliperidone (INVEGA) 9 MG 24 hr tablet Take 1 tablet (9 mg total) by mouth at bedtime. 09/24/20   Antonieta Pert, MD  traZODone (DESYREL) 100 MG tablet Take 1 tablet (100 mg total) by mouth at bedtime. 09/24/20   Antonieta Pert, MD    Allergies  Patient has no known allergies.  Review of Systems   Review of Systems  All other systems reviewed and are negative.   Physical Exam Updated Vital Signs BP 133/81   Pulse (!) 107   Temp 97.9 F (36.6 C) (Oral)   Resp 18   LMP 09/13/2020 (Exact Date)   SpO2 97%   Physical Exam Vitals and nursing note reviewed. Exam conducted with a chaperone present.  Constitutional:      Comments: T-shirt is inside-out.  Blood stains on shirt.  Questionable cleanliness.  HENT:     Head: Normocephalic and atraumatic.  Eyes:     General: No scleral icterus.    Conjunctiva/sclera: Conjunctivae normal.   Cardiovascular:     Rate and Rhythm: Normal rate.     Pulses: Normal pulses.  Pulmonary:     Effort: Pulmonary effort is normal. No respiratory distress.  Abdominal:     General: Abdomen is flat. There is no distension.     Palpations: Abdomen is soft.     Tenderness: There is no abdominal tenderness.  Skin:    General: Skin is dry.     Capillary Refill: Capillary refill takes less than 2 seconds.  Neurological:     General: No focal deficit present.     Mental Status: She is alert.     GCS: GCS eye subscore is 4. GCS verbal subscore is 5. GCS motor subscore is 6.  Psychiatric:        Thought Content: Thought content normal.     Comments: Catatonic.  Flat affect.      ED Results / Procedures / Treatments   Labs (all labs ordered are listed, but only abnormal results are displayed) Labs Reviewed  COMPREHENSIVE METABOLIC PANEL - Abnormal; Notable for the following components:      Result Value   Glucose, Bld 113 (*)    Creatinine, Ser 1.15 (*)    Total Protein 10.0 (*)    All other components within normal limits  CBC WITH DIFFERENTIAL/PLATELET - Abnormal; Notable for the following components:   WBC 10.9 (*)    Neutro Abs 8.2 (*)    All other components within normal limits  RESP PANEL BY RT-PCR (FLU A&B, COVID) ARPGX2  ETHANOL  RAPID URINE DRUG SCREEN, HOSP PERFORMED  I-STAT BETA HCG BLOOD, ED ([facility], [facility], [facility] ONLY)    EKG None  Radiology No results found.  Procedures .Critical Care Performed by: Lorelee New, PA-C Authorized by: Lorelee New, PA-C   Critical care provider statement:    Critical care time (minutes):  45   Critical care was time spent personally by me on the following activities:  Discussions with consultants, evaluation of patient's response to treatment, examination of patient, ordering and performing treatments and interventions, ordering and review of laboratory studies, ordering and review of radiographic studies, pulse oximetry,  re-evaluation of patient's condition, obtaining history from patient or surrogate and review of old charts Comments:     Psychosis requiring multiple separate interventions of IM medication.    (including critical care time)  Medications Ordered in ED Medications  ziprasidone (GEODON) injection 20 mg (has no administration in time range)  LORazepam (ATIVAN) injection 2 mg (2 mg Intramuscular Given 09/27/20 1235)    ED Course  I have reviewed the triage vital signs and the nursing notes.  Pertinent labs & imaging results that were available during my care of the patient were reviewed by me and considered in my medical decision making (see  chart for details).    MDM Rules/Calculators/A&P                          We will obtain medical clearance laboratory work-up and then reconsult TTS.  While she was just discharged from her psychiatric admission 09/24/2020, she once again appears to be catatonic and is dressed in a bloodstained T-shirt that she is wearing inside out.  I believe that TTS evaluation is warranted.  Medically she is cleared.  Laboratory work-up is still pending, but her physical exam is unremarkable.  She is able to move her extremities and denies any specific medical complaints aside from her legs feeling "sore".  No trauma or injuries noted.  While she had been treated with her long-term injectable, she may require further medication management and states that she does not want to take her outpatient medications with [facility].    Laboratory work-up is reviewed and unremarkable.  TTS to evaluate patient and determine disposition.  Update: Patient is now trying to flee and is noted by nursing staff to be acting irrationally.  Given concern for psychosis in the context of medical noncompliance, will place IVC orders given concern for safety of herself and others.   Final Clinical Impression(s) / ED Diagnoses Final diagnoses:  Catatonia schizophrenia [facility])    Rx / DC  Orders ED Discharge Orders    None       Elvera Maria 09/27/20 0956    Tilden Fossa, MD 09/27/20 1003    Lorelee New, PA-C 09/27/20 1258    Lorelee New, PA-C 09/27/20 1318    Tilden Fossa, MD 09/27/20 [phone]

## 2020-09-27 NOTE — Progress Notes (Signed)
Pt up off bed attempting to walk thru the unit. Redirectable *2

## 2020-09-27 NOTE — BH Assessment (Signed)
Rankin NP recommends a inpatient admission to assist with stabilization.
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
## 2020-09-27 NOTE — ED Notes (Signed)
Pt attempted to leave the dept again, yelling and screaming at security.. medicated per order.

## 2020-09-27 NOTE — ED Notes (Signed)
Pts right arm removed from restraint. Pt verbalizes understanding of expectations and understanding that she must remain in bed. Pt agrees. Pt is calm at this time.

## 2020-09-27 NOTE — ED Triage Notes (Addendum)
Pt presents via EMS from home. Pt was discharged from [facility] on Christmas Eve. Per EMS, pt has not had her schizophrenic meds since she was discharged. Pt will follow simple commands but is otherwise not a good historian. Pt does deny SI.

## 2020-09-27 NOTE — ED Notes (Signed)
Pt attempted to leave the dept and was found by security in [place]. Brought back to hallway bed. Pt remains nonverbal and very flat, just staring at the wall with no attempt to interact.

## 2020-09-27 NOTE — ED Notes (Signed)
Assumed care of pt at this time. Pt arrives to hall D in restraints. PA aware restraint order is needed.

## 2020-09-28 ENCOUNTER — Encounter (HOSPITAL_COMMUNITY): Payer: Self-pay | Admitting: Psychiatry

## 2020-09-28 ENCOUNTER — Other Ambulatory Visit: Payer: Self-pay | Admitting: Registered Nurse

## 2020-09-28 ENCOUNTER — Other Ambulatory Visit: Payer: Self-pay

## 2020-09-28 ENCOUNTER — Inpatient Hospital Stay (HOSPITAL_COMMUNITY)
Admission: AD | Admit: 2020-09-28 | Discharge: 2020-10-06 | DRG: 885 | Disposition: A | Discharge status: Home / Self Care | Payer: Medicaid Other | Source: Intra-hospital | Attending: Psychiatry | Admitting: Psychiatry

## 2020-09-28 ENCOUNTER — Encounter (HOSPITAL_COMMUNITY): Payer: Self-pay | Admitting: Registered Nurse

## 2020-09-28 DIAGNOSIS — F200 Paranoid schizophrenia: Secondary | ICD-10-CM | POA: Diagnosis present

## 2020-09-28 DIAGNOSIS — Z9114 Patient's other noncompliance with medication regimen: Secondary | ICD-10-CM | POA: Diagnosis not present

## 2020-09-28 DIAGNOSIS — E669 Obesity, unspecified: Secondary | ICD-10-CM | POA: Diagnosis present

## 2020-09-28 DIAGNOSIS — Z833 Family history of diabetes mellitus: Secondary | ICD-10-CM | POA: Diagnosis not present

## 2020-09-28 DIAGNOSIS — Z8249 Family history of ischemic heart disease and other diseases of the circulatory system: Secondary | ICD-10-CM | POA: Diagnosis not present

## 2020-09-28 DIAGNOSIS — F209 Schizophrenia, unspecified: Principal | ICD-10-CM | POA: Diagnosis present

## 2020-09-28 DIAGNOSIS — G4700 Insomnia, unspecified: Secondary | ICD-10-CM | POA: Diagnosis present

## 2020-09-28 DIAGNOSIS — F202 Catatonic schizophrenia: Secondary | ICD-10-CM | POA: Diagnosis not present

## 2020-09-28 MED ORDER — LORAZEPAM 1 MG PO TABS: 1.0000 mg | ORAL_TABLET | Freq: Three times a day (TID) | ORAL | Status: DC

## 2020-09-28 MED ORDER — HALOPERIDOL 5 MG PO TABS
5.0000 mg | ORAL_TABLET | Freq: Two times a day (BID) | ORAL | Status: DC
Start: 2020-09-28 — End: 2020-09-28
  Filled 2020-09-28 (×2): qty 1

## 2020-09-28 MED ORDER — ZIPRASIDONE MESYLATE 20 MG IM SOLR
20.0000 mg | Freq: Four times a day (QID) | INTRAMUSCULAR | Status: DC | PRN
Start: 2020-09-28 — End: 2020-09-29

## 2020-09-28 MED ORDER — ALUM & MAG HYDROXIDE-SIMETH 200-200-20 MG/5ML PO SUSP: 30.0000 mL | ORAL | Status: DC | PRN

## 2020-09-28 MED ORDER — PALIPERIDONE PALMITATE ER 156 MG/ML IM SUSY
156.0000 mg | PREFILLED_SYRINGE | Freq: Once | INTRAMUSCULAR | Status: DC
  Filled 2020-09-28: qty 1

## 2020-09-28 MED ORDER — LORAZEPAM 2 MG/ML IJ SOLN
2.0000 mg | Freq: Two times a day (BID) | INTRAMUSCULAR | Status: DC
  Administered 2020-09-28 – 2020-10-04 (×10): 2 mg via INTRAMUSCULAR
  Filled 2020-09-28 (×10): qty 1

## 2020-09-28 MED ORDER — MAGNESIUM HYDROXIDE 400 MG/5ML PO SUSP: 30.0000 mL | Freq: Every day | ORAL | Status: DC | PRN

## 2020-09-28 MED ORDER — HALOPERIDOL LACTATE 5 MG/ML IJ SOLN
10.0000 mg | Freq: Two times a day (BID) | INTRAMUSCULAR | Status: DC
  Administered 2020-09-28 – 2020-09-30 (×4): 10 mg via INTRAMUSCULAR
  Filled 2020-09-28 (×10): qty 2

## 2020-09-28 MED ORDER — PALIPERIDONE ER 6 MG PO TB24
9.0000 mg | ORAL_TABLET | Freq: Every day | ORAL | Status: DC
  Administered 2020-09-28 – 2020-10-01 (×4): 9 mg via ORAL
  Filled 2020-09-28 (×7): qty 1

## 2020-09-28 MED ORDER — HALOPERIDOL 5 MG PO TABS
10.0000 mg | ORAL_TABLET | Freq: Two times a day (BID) | ORAL | Status: DC
  Filled 2020-09-28 (×6): qty 2

## 2020-09-28 MED ORDER — TRAZODONE HCL 100 MG PO TABS
100.0000 mg | ORAL_TABLET | Freq: Every day | ORAL | Status: DC
  Administered 2020-09-28 – 2020-10-05 (×6): 100 mg via ORAL
  Filled 2020-09-28 (×14): qty 1

## 2020-09-28 MED ORDER — LORAZEPAM 1 MG PO TABS: 1.0000 mg | ORAL_TABLET | ORAL | Status: DC | PRN

## 2020-09-28 MED ORDER — BENZTROPINE MESYLATE 1 MG/ML IJ SOLN
1.0000 mg | Freq: Two times a day (BID) | INTRAMUSCULAR | Status: DC
  Administered 2020-09-28 – 2020-10-04 (×10): 1 mg via INTRAMUSCULAR
  Filled 2020-09-28: qty 1
  Filled 2020-09-28 (×3): qty 2
  Filled 2020-09-28 (×7): qty 1
  Filled 2020-09-28: qty 2
  Filled 2020-09-28 (×4): qty 1
  Filled 2020-09-28 (×2): qty 2
  Filled 2020-09-28 (×6): qty 1

## 2020-09-28 MED ORDER — TRAZODONE HCL 100 MG PO TABS: 100.0000 mg | ORAL_TABLET | Freq: Every day | ORAL | Status: DC

## 2020-09-28 MED ORDER — BENZTROPINE MESYLATE 1 MG PO TABS
1.0000 mg | ORAL_TABLET | Freq: Two times a day (BID) | ORAL | Status: DC
  Administered 2020-10-01 – 2020-10-06 (×6): 1 mg via ORAL
  Filled 2020-09-28 (×19): qty 1

## 2020-09-28 MED ORDER — BENZTROPINE MESYLATE 1 MG PO TABS
1.0000 mg | ORAL_TABLET | Freq: Two times a day (BID) | ORAL | Status: DC
Start: 2020-09-28 — End: 2020-09-28
  Filled 2020-09-28 (×2): qty 1

## 2020-09-28 MED ORDER — HALOPERIDOL 5 MG PO TABS
5.0000 mg | ORAL_TABLET | Freq: Two times a day (BID) | ORAL | Status: DC
Start: 2020-09-28 — End: 2020-09-28
  Filled 2020-09-28: qty 1

## 2020-09-28 MED ORDER — RISPERIDONE 2 MG PO TBDP
2.0000 mg | ORAL_TABLET | Freq: Three times a day (TID) | ORAL | Status: DC | PRN
  Filled 2020-09-28: qty 1

## 2020-09-28 MED ORDER — PALIPERIDONE ER 6 MG PO TB24
9.0000 mg | ORAL_TABLET | Freq: Every day | ORAL | Status: DC
  Filled 2020-09-28: qty 1

## 2020-09-28 MED ORDER — LORAZEPAM 1 MG PO TABS
2.0000 mg | ORAL_TABLET | Freq: Two times a day (BID) | ORAL | Status: DC
  Administered 2020-10-01 – 2020-10-06 (×6): 2 mg via ORAL
  Filled 2020-09-28 (×9): qty 2

## 2020-09-28 MED ORDER — CLONIDINE HCL 0.1 MG PO TABS
0.1000 mg | ORAL_TABLET | Freq: Two times a day (BID) | ORAL | Status: DC
  Filled 2020-09-28: qty 1

## 2020-09-28 MED ORDER — ACETAMINOPHEN 325 MG PO TABS: 650.0000 mg | ORAL_TABLET | Freq: Four times a day (QID) | ORAL | Status: DC | PRN

## 2020-09-28 MED ORDER — PALIPERIDONE ER 6 MG PO TB24: 9.0000 mg | ORAL_TABLET | Freq: Every day | ORAL | Status: DC

## 2020-09-28 MED ORDER — LORAZEPAM 1 MG PO TABS
1.0000 mg | ORAL_TABLET | Freq: Three times a day (TID) | ORAL | Status: DC
  Filled 2020-09-28: qty 1

## 2020-09-28 MED FILL — INVEGA SUSTENNA 156 MG PREF: 156 | 7 days supply | Qty: 1 | Fill #0

## 2020-09-28 NOTE — ED Notes (Signed)
Hourly rounding complete. Pt resting comfortably with bed in lowest position and side rails up x2. Vitals updated. All needs met at this time. Will continue to monitor.

## 2020-09-28 NOTE — Progress Notes (Addendum)
Pt is a 24 y/o [place] American female admitted to [facility] under IVC status after presenting to [facility] for medication noncompliance and catatonic like state with EMS. Per nursing pt's family called EMS as she was not taking her medications since she was d/c from [facility] on 12/24/21and has a history of Schizophrenia. On arrival to [facility] pt did appeared in a catatonic like state with blank stares, elective mutism, refusing to sit up or transferred to wheelchair. Staff and [employer] officers had to transfer pt to wheelchair to come to unit. Skin assessment done in her room. She presents unkept with body odor. Pt's skin is dry with multiple scars, from "from my old pimples / boils" on her back, chest and breast. Pt assisted in bed, face and hand cleaned at this time. Writer unable to complete admission assessment as pt will not respond to questions when asked. Q 15 minutes safety checks initiated without self harm gestures. Emotional support and reassurance offered. Pt encouraged to voice concerns.

## 2020-09-28 NOTE — Progress Notes (Signed)
Adult Psychoeducational Group Note  Date:  09/28/2020 Time:  9:56 PM  Group Topic/Focus:  Wrap-Up Group:   The focus of this group is to help patients review their daily goal of treatment and discuss progress on daily workbooks.  Participation Level:  Did Not Attend  Participation Quality:  Did Not Attend  Affect:  Did Not Attend  Cognitive:  Did Not Attend  Insight: None  Engagement in Group:  Did Not Attend  Modes of Intervention:  Did Not Attend  Additional Comments:  Pt did not attend evening wrap up group tonight.  Felipa Furnace 09/28/2020, 9:56 PM

## 2020-09-28 NOTE — ED Notes (Signed)
[employer] called to transport patient to [facility] 

## 2020-09-28 NOTE — Progress Notes (Signed)
Pt noted with spontaneous running from her room up to the double doors in an attempt to elope off unit X2. Observed smiling, with blank stares as if responding to internal stimuli. Verbal redirections ineffective at the time.  Dr. Jola Babinski made aware of incident. Pt given Haldol, Ativan and Cogentin all IM (see EMAR) at approximately 1717. Continued support and reassurance offered to pt. Q 15 minutes safety checks maintained.  Pt awake in hall at 1815. Appears calm, forwards a little on conversation. Pt was able to sign admission documents in dayroom and abruptly left "I'm going to my room". Pt is ambulatory in milieu with a steady gait and verbal at this time.

## 2020-09-28 NOTE — Tx Team (Signed)
Initial Treatment Plan 09/28/2020 8:14 PM Lindsey Richmond YPP:[id number]    PATIENT STRESSORS: Medication change or noncompliance   PATIENT STRENGTHS: Communication skills Physical Health Supportive family/friends   PATIENT IDENTIFIED PROBLEMS: Alterations in thought process (disorganized)   Medication noncompliance (catatonic state)                   DISCHARGE CRITERIA:  Improved stabilization in mood, thinking, and/or behavior Verbal commitment to aftercare and medication compliance  PRELIMINARY DISCHARGE PLAN: Outpatient therapy Return to previous living arrangement  PATIENT/FAMILY INVOLVEMENT: This treatment plan has been presented to and reviewed with the patient, Lindsey Richmond. The patient have been given the opportunity to ask questions and make suggestions.  Sherryl Manges, RN 09/28/2020, 8:14 PM

## 2020-09-28 NOTE — BH Assessment (Addendum)
[facility] Assessment Progress Note  Per Shuvon Rankin, NP, this pt requires psychiatric hospitalization.  Danika, RN, [facility] has assigned pt to [facility] Rm 503-1; [facility] will be ready to receive pt at 14:00.  Pt presents under IVC initiated by EDP Tilden Fossa, MD, and IVC documents have been faxed to [facility].  EDP Pricilla Loveless, MD and pt's nurse, Rosette Reveal, have been notified, and Galaxi agrees to call report to [phone].  Pt is to be transported via Patent examiner.   Doylene Canning, [place] Behavioral Health Coordinator [phone]

## 2020-09-28 NOTE — ED Notes (Signed)
Pt resting quietly in bed.  Appears sleeping.  Respirations even and unlabored.  NADN.  Sitter at bedside.  Will continue to monitor.

## 2020-09-28 NOTE — Consult Note (Addendum)
   This Clinical research associate was unable to complete psychiatric assessment for Lindsey Richmond, 24 y.o., female patient via tele psych related to patient would not respond to any questions.  Chart reviewed and consulted with Dr. Kumar12/28/21.    During evaluation Lindsey Richmond is laying in bed with her eyes closed.  She would open and then close back without answering any questions.   Patient did not appear to be in any acute distress. Patient was laying calmly in bed.  Patient sitter states that there has been no behavioral outburst other than patient needing to be redirected back to her bed.  States that the patient hasn't been talking to anyone or responding to anyone's questions.  States patient hasn't been talking at all.   Sitter states she is familiar with patient last ED visit and presentation is the same.    Similar to previous psychiatric hospitalization; patient presenting catatonic.  There is also question as to if patient has been compliant with her medications and if it is time for her [place] injection.     Medication management:   Will restart home medications:  Clonidine 0.1 mg Bid  Haldol 5 mg Bid Ativan 1 mg Tid Invega 9 mg Q hs Trazodone 100 mg Q hs Invega Sustenna 156 mg to be given 10/20/20  Recommendation:  Inpatient psychiatric treatment for stabilization  Patient scheduled follow up appointment was scheduled at [facility] for 10/12/2020 at 1:00 PM  Disposition: Recommend psychiatric Inpatient admission when medically cleared.     Traylen Eckels B. Kodee Ravert, NP   Patient has been accepted to [facility] 503/1 after 1400.  Admission orders entered

## 2020-09-29 DIAGNOSIS — F202 Catatonic schizophrenia: Secondary | ICD-10-CM

## 2020-09-29 MED ORDER — DIPHENHYDRAMINE HCL 25 MG PO CAPS: 25.0000 mg | ORAL_CAPSULE | ORAL | Status: DC | PRN

## 2020-09-29 MED ORDER — BENZTROPINE MESYLATE 1 MG PO TABS: 1.0000 mg | ORAL_TABLET | ORAL | Status: DC | PRN

## 2020-09-29 MED ORDER — DIPHENHYDRAMINE HCL 50 MG/ML IJ SOLN
25.0000 mg | Freq: Four times a day (QID) | INTRAMUSCULAR | Status: DC | PRN
  Administered 2020-09-30 – 2020-10-04 (×4): 25 mg via INTRAMUSCULAR
  Filled 2020-09-29 (×4): qty 1

## 2020-09-29 MED ORDER — LORAZEPAM 2 MG/ML IJ SOLN
2.0000 mg | Freq: Four times a day (QID) | INTRAMUSCULAR | Status: DC | PRN
  Administered 2020-09-29 – 2020-10-01 (×3): 2 mg via INTRAMUSCULAR
  Filled 2020-09-29 (×3): qty 1

## 2020-09-29 MED ORDER — BENZTROPINE MESYLATE 1 MG PO TABS: 1.0000 mg | ORAL_TABLET | Freq: Four times a day (QID) | ORAL | Status: DC | PRN

## 2020-09-29 MED ORDER — PALIPERIDONE PALMITATE ER 156 MG/ML IM SUSY
156.0000 mg | PREFILLED_SYRINGE | Freq: Once | INTRAMUSCULAR | Status: AC
  Administered 2020-09-29: 13:00:00 156 mg via INTRAMUSCULAR

## 2020-09-29 MED ORDER — HALOPERIDOL 5 MG PO TABS
10.0000 mg | ORAL_TABLET | Freq: Four times a day (QID) | ORAL | Status: DC | PRN
  Administered 2020-09-29: 15:00:00 10 mg via ORAL

## 2020-09-29 MED ORDER — HALOPERIDOL 5 MG PO TABS: 10.0000 mg | ORAL_TABLET | ORAL | Status: DC | PRN
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
## 2020-09-29 NOTE — Progress Notes (Signed)
Pt stayed in [place]o much of the evening, pt came to the med window to get HS meds and something to drink.    09/29/20 0200  Psych Admission Type (Psych Patients Only)  Admission Status Involuntary  Psychosocial Assessment  Patient Complaints None  Eye Contact Avertive  Facial Expression Fixed smile  Affect Appropriate to circumstance  Speech Logical/coherent  Interaction Forwards little  Motor Activity Slow  Appearance/Hygiene Poor hygiene;In scrubs  Behavior Characteristics Appropriate to situation  Mood Anxious;Suspicious;Labile  Thought Process  Coherency Disorganized  Content Preoccupation  Delusions None reported or observed  Perception Derealization  Hallucination None reported or observed (Appears to be responding to internal stimuli)  Judgment Impaired  Confusion Mild  Danger to Self  Current suicidal ideation? Denies  Danger to Others  Danger to Others None reported or observed  Danger to Others Abnormal  Harmful Behavior to others No threats or harm toward other people  Destructive Behavior No threats or harm toward property

## 2020-09-29 NOTE — Tx Team (Signed)
Interdisciplinary Treatment and Diagnostic Plan Update  09/29/2020 Time of Session: 9:00am Lindsey Richmond MRN: [id number]  Principal Diagnosis: <principal problem not specified>  Secondary Diagnoses: Active Problems:   Schizophrenia ([place])   Schizophrenia, paranoid type ([place])   Current Medications:  Current Facility-Administered Medications  Medication Dose Route Frequency Provider Last Rate Last Admin  . acetaminophen (TYLENOL) tablet 650 mg  650 mg Oral Q6H PRN Sharma Covert, MD      . alum & mag hydroxide-simeth (MAALOX/MYLANTA) 200-200-20 MG/5ML suspension 30 mL  30 mL Oral Q4H PRN Sharma Covert, MD      . benztropine (COGENTIN) tablet 1 mg  1 mg Oral BID Sharma Covert, MD       Or  . benztropine mesylate (COGENTIN) injection 1 mg  1 mg Intramuscular BID Sharma Covert, MD   1 mg at 09/29/20 0859  . haloperidol (HALDOL) tablet 10 mg  10 mg Oral BID Sharma Covert, MD       Or  . haloperidol lactate (HALDOL) injection 10 mg  10 mg Intramuscular BID Sharma Covert, MD   10 mg at 09/29/20 0859  . LORazepam (ATIVAN) tablet 2 mg  2 mg Oral BID Sharma Covert, MD       Or  . LORazepam (ATIVAN) injection 2 mg  2 mg Intramuscular BID Sharma Covert, MD   2 mg at 09/29/20 0858  . risperiDONE (RISPERDAL M-TABS) disintegrating tablet 2 mg  2 mg Oral Q8H PRN Sharma Covert, MD       And  . LORazepam (ATIVAN) tablet 1 mg  1 mg Oral PRN Sharma Covert, MD       And  . ziprasidone (GEODON) injection 20 mg  20 mg Intramuscular Q6H PRN Sharma Covert, MD      . magnesium hydroxide (MILK OF MAGNESIA) suspension 30 mL  30 mL Oral Daily PRN Rankin, Shuvon B, NP      . paliperidone (INVEGA SUSTENNA) injection 156 mg  156 mg Intramuscular Once Sharma Covert, MD      . paliperidone (INVEGA) 24 hr tablet 9 mg  9 mg Oral QHS Sharma Covert, MD   9 mg at 09/28/20 2310  . traZODone (DESYREL) tablet 100 mg  100 mg Oral QHS Rankin, Shuvon B, NP   100 mg  at 09/28/20 2310   PTA Medications: Medications Prior to Admission  Medication Sig Dispense Refill Last Dose  . cloNIDine (CATAPRES) 0.1 MG tablet Take 1 tablet (0.1 mg total) by mouth 2 (two) times daily. 60 tablet 0   . haloperidol (HALDOL) 5 MG tablet Take 1 tablet (5 mg total) by mouth 2 (two) times daily. 60 tablet 0   . LORazepam (ATIVAN) 1 MG tablet Take 1 tablet (1 mg total) by mouth 3 (three) times daily. 90 tablet 0   . paliperidone (INVEGA SUSTENNA) 156 MG/ML SUSY injection Inject 1 mL (156 mg total) into the muscle every 30 (thirty) days. 1.2 mL 1   . paliperidone (INVEGA) 9 MG 24 hr tablet Take 1 tablet (9 mg total) by mouth at bedtime. 30 tablet 0   . traZODone (DESYREL) 100 MG tablet Take 1 tablet (100 mg total) by mouth at bedtime. 30 tablet 0     Patient Stressors: Medication change or noncompliance  Patient Strengths: Armed forces logistics/support/administrative officer Physical Health Supportive family/friends  Treatment Modalities: Medication Management, Group therapy, Case management,  1 to 1 session with clinician, Psychoeducation, Recreational therapy.  Physician Treatment Plan for Primary Diagnosis: <principal problem not specified> Long Term Goal(s):     Short Term Goals:    Medication Management: Evaluate patient's response, side effects, and tolerance of medication regimen.  Therapeutic Interventions: 1 to 1 sessions, Unit Group sessions and Medication administration.  Evaluation of Outcomes: Not Met  Physician Treatment Plan for Secondary Diagnosis: Active Problems:   Schizophrenia ([place])   Schizophrenia, paranoid type ([place])  Long Term Goal(s):     Short Term Goals:       Medication Management: Evaluate patient's response, side effects, and tolerance of medication regimen.  Therapeutic Interventions: 1 to 1 sessions, Unit Group sessions and Medication administration.  Evaluation of Outcomes: Not Met   RN Treatment Plan for Primary Diagnosis: <principal problem not  specified> Long Term Goal(s): Knowledge of disease and therapeutic regimen to maintain health will improve  Short Term Goals: Ability to remain free from injury will improve, Ability to verbalize frustration and anger appropriately will improve, Ability to participate in decision making will improve, Ability to verbalize feelings will improve, Ability to identify and develop effective coping behaviors will improve and Compliance with prescribed medications will improve  Medication Management: RN will administer medications as ordered by provider, will assess and evaluate patient's response and provide education to patient for prescribed medication. RN will report any adverse and/or side effects to prescribing provider.  Therapeutic Interventions: 1 on 1 counseling sessions, Psychoeducation, Medication administration, Evaluate responses to treatment, Monitor vital signs and CBGs as ordered, Perform/monitor CIWA, COWS, AIMS and Fall Risk screenings as ordered, Perform wound care treatments as ordered.  Evaluation of Outcomes: Not Met   LCSW Treatment Plan for Primary Diagnosis: <principal problem not specified> Long Term Goal(s): Safe transition to appropriate next level of care at discharge, Engage patient in therapeutic group addressing interpersonal concerns.  Short Term Goals: Engage patient in aftercare planning with referrals and resources, Increase ability to appropriately verbalize feelings, Facilitate acceptance of mental health diagnosis and concerns, Identify triggers associated with mental health/substance abuse issues and Increase skills for wellness and recovery  Therapeutic Interventions: Assess for all discharge needs, 1 to 1 time with Social worker, Explore available resources and support systems, Assess for adequacy in community support network, Educate family and significant other(s) on suicide prevention, Complete Psychosocial Assessment, Interpersonal group therapy.  Evaluation  of Outcomes: Not Met   Progress in Treatment: Attending groups: No. Participating in groups: No. Taking medication as prescribed: Yes. Toleration medication: Yes. Family/Significant other contact made: No, will contact:  if consent is given Patient understands diagnosis: Yes. Discussing patient identified problems/goals with staff: No. Medical problems stabilized or resolved: Yes. Denies suicidal/homicidal ideation: Yes. Issues/concerns per patient self-inventory: No.   New problem(s) identified: Yes, Describe:  patient currently catotanic   New Short Term/Long Term Goal(s): medication stabilization, elimination of SI thoughts, development of comprehensive mental wellness plan.   Patient Goals:  Patient unable to attend.  Discharge Plan or Barriers: Patient recently admitted. CSW will continue to follow and assess for appropriate referrals and possible discharge planning.   Reason for Continuation of Hospitalization: Delusions  Hallucinations Medication stabilization Other; describe Catatonia   Estimated Length of Stay: 3-5 days  Attendees: Patient: Did not attend 09/29/2020  Physician:  09/29/2020   Nursing:  09/29/2020   RN Care Manager: 09/29/2020   Social Worker: Darletta Moll, LCSW 09/29/2020   Recreational Therapist:  09/29/2020   Other:  09/29/2020   Other:  09/29/2020   Other: 09/29/2020  Scribe for Treatment Team: Vassie Moselle, LCSW 09/29/2020 9:27 AM

## 2020-09-29 NOTE — H&P (Signed)
Psychiatric Admission Assessment Adult  Patient Identification: Lindsey Richmond MRN:  [id number] Date of Evaluation:  09/29/2020 Chief Complaint:  Schizophrenia, paranoid type (HCC) [F20.0] Schizophrenia (HCC) [F20.9] Principal Diagnosis: <principal problem not specified> Diagnosis:  Active Problems:   Schizophrenia (HCC)   Schizophrenia, paranoid type (HCC)  History of Present Illness: Patient is seen and examined.  Patient is a 24 year old female with a longstanding past psychiatric history significant for schizophrenia well-known to this facility.  The patient was recently admitted secondary to noncompliance with medications.  She had become catatonic, had been placed on Haldol with Ativan.  She was placed on oral paliperidone, and received an injectable long-acting paliperidone 156 mg while waiting for admission to our facility as well as 1 injection during the course of the hospitalization.  Her family brought her back to the emergency department shortly thereafter again secondary to noncompliance with her medications.  The patient presented by EMS with altered mental status.  She was essentially catatonic on 09/27/20.  She told the evaluator's in the emergency department that she came to the emergency department because her parents were not being "nice to her".  On examination today she is essentially catatonic again.  So far since she has arrived on the unit she has received Haldol 10 mg p.o. or IM every 12 hours as well as lorazepam 2 mg p.o. or IM twice daily as well.  That helped relieve her catatonia on her last hospitalization.  Of note, in the assessment note in the emergency department the mother confirmed that she is obtaining guardianship which had been steadily declined over the last year.  The mother reported that the patient had only been sleeping 4 to 5 hours a night, and did clearly appear to be responding to internal stimuli.  She was admitted to the hospital for evaluation and  stabilization.  Associated Signs/Symptoms: Depression Symptoms:  anhedonia, insomnia, fatigue, disturbed sleep, Duration of Depression Symptoms: Greater than two weeks  (Hypo) Manic Symptoms:  Delusions, Distractibility, Hallucinations, Impulsivity, Irritable Mood, Labiality of Mood, Anxiety Symptoms:  Excessive Worry, Psychotic Symptoms:  Delusions, Hallucinations: Auditory Paranoia, Duration of Psychotic Symptoms: Less than six months  PTSD Symptoms: Negative Total Time spent with patient: 45 minutes  Past Psychiatric History: Patient has had multiple psychiatric hospitalizations at our facility.  Her last was on 09/13/2020 for similar circumstances.  She has been treated with multiple medications in the past.  She is followed by a ACT team, and most recently has been on long-acting paliperidone, Risperdal, Cogentin, trazodone and apparently Provigil.  Is the patient at risk to self? Yes.    Has the patient been a risk to self in the past 6 months? Yes.    Has the patient been a risk to self within the distant past? Yes.    Is the patient a risk to others? No.  Has the patient been a risk to others in the past 6 months? No.  Has the patient been a risk to others within the distant past? No.   Prior Inpatient Therapy:   Prior Outpatient Therapy:    Alcohol Screening: 1. How often do you have a drink containing alcohol?: Never 2. How many drinks containing alcohol do you have on a typical day when you are drinking?: 1 or 2 3. How often do you have six or more drinks on one occasion?: Never AUDIT-C Score: 0 4. How often during the last year have you found that you were not able to stop drinking once you had  started?: Never 5. How often during the last year have you failed to do what was normally expected from you because of drinking?: Never 6. How often during the last year have you needed a first drink in the morning to get yourself going after a heavy drinking session?:  Never 7. How often during the last year have you had a feeling of guilt of remorse after drinking?: Never 8. How often during the last year have you been unable to remember what happened the night before because you had been drinking?: Never 9. Have you or someone else been injured as a result of your drinking?: No 10. Has a relative or friend or a doctor or another health worker been concerned about your drinking or suggested you cut down?: No Alcohol Use Disorder Identification Test Final Score (AUDIT): 0 Substance Abuse History in the last 12 months:  No. Consequences of Substance Abuse: Negative Previous Psychotropic Medications: Yes  Psychological Evaluations: Yes  Past Medical History:  Past Medical History:  Diagnosis Date  . Anxiety   . Depression   . Oppositional defiant behavior   . Schizo affective schizophrenia (HCC)   . Schizophrenia (HCC)    History reviewed. No pertinent surgical history. Family History:  Family History  Problem Relation Age of Onset  . Heart failure Mother   . Hypertension Mother   . Diabetes Father    Family Psychiatric  History: Reportedly negative Tobacco Screening: Have you used any form of tobacco in the last 30 days? (Cigarettes, Smokeless Tobacco, Cigars, and/or Pipes): No Social History:  Social History   Substance and Sexual Activity  Alcohol Use No     Social History   Substance and Sexual Activity  Drug Use No    Additional Social History:                           Allergies:  No Known Allergies Lab Results:  Results for orders placed or performed during the hospital encounter of 09/27/20 (from the past 48 hour(s))  Urine rapid drug screen (hosp performed)     Status: Abnormal   Collection Time: 09/27/20  7:15 PM  Result Value Ref Range   Opiates NONE DETECTED NONE DETECTED   Cocaine NONE DETECTED NONE DETECTED   Benzodiazepines POSITIVE (A) NONE DETECTED   Amphetamines NONE DETECTED NONE DETECTED    Tetrahydrocannabinol NONE DETECTED NONE DETECTED   Barbiturates NONE DETECTED NONE DETECTED    Comment: (NOTE) DRUG SCREEN FOR MEDICAL PURPOSES ONLY.  IF CONFIRMATION IS NEEDED FOR ANY PURPOSE, NOTIFY LAB WITHIN 5 DAYS.  LOWEST DETECTABLE LIMITS FOR URINE DRUG SCREEN Drug Class                     Cutoff (ng/mL) Amphetamine and metabolites    1000 Barbiturate and metabolites    200 Benzodiazepine                 200 Tricyclics and metabolites     300 Opiates and metabolites        300 Cocaine and metabolites        300 THC                            50 Performed at [facility], [address] [place], [place] [address]     Blood Alcohol level:  Lab Results  Component Value Date   ETH <  10 09/27/2020   ETH <10 09/09/2020    Metabolic Disorder Labs:  Lab Results  Component Value Date   HGBA1C 5.6 12/27/2019   MPG 114.02 12/27/2019   MPG 105.41 12/04/2019   Lab Results  Component Value Date   PROLACTIN 57.7 (H) 12/04/2019   PROLACTIN 75.7 (H) 10/23/2019   Lab Results  Component Value Date   CHOL 145 12/27/2019   TRIG 59 12/27/2019   HDL 24 (L) 12/27/2019   CHOLHDL 6.0 12/27/2019   VLDL 12 12/27/2019   LDLCALC 109 (H) 12/27/2019   LDLCALC 100 (H) 12/04/2019    Current Medications: Current Facility-Administered Medications  Medication Dose Route Frequency Provider Last Rate Last Admin  . acetaminophen (TYLENOL) tablet 650 mg  650 mg Oral Q6H PRN Antonieta Pert, MD      . alum & mag hydroxide-simeth (MAALOX/MYLANTA) 200-200-20 MG/5ML suspension 30 mL  30 mL Oral Q4H PRN Antonieta Pert, MD      . benztropine (COGENTIN) tablet 1 mg  1 mg Oral BID Antonieta Pert, MD       Or  . benztropine mesylate (COGENTIN) injection 1 mg  1 mg Intramuscular BID Antonieta Pert, MD   1 mg at 09/29/20 0859  . haloperidol (HALDOL) tablet 10 mg  10 mg Oral BID Antonieta Pert, MD       Or  . haloperidol lactate (HALDOL) injection 10 mg  10 mg  Intramuscular BID Antonieta Pert, MD   10 mg at 09/29/20 0859  . LORazepam (ATIVAN) tablet 2 mg  2 mg Oral BID Antonieta Pert, MD       Or  . LORazepam (ATIVAN) injection 2 mg  2 mg Intramuscular BID Antonieta Pert, MD   2 mg at 09/29/20 0858  . risperiDONE (RISPERDAL M-TABS) disintegrating tablet 2 mg  2 mg Oral Q8H PRN Antonieta Pert, MD       And  . LORazepam (ATIVAN) tablet 1 mg  1 mg Oral PRN Antonieta Pert, MD       And  . ziprasidone (GEODON) injection 20 mg  20 mg Intramuscular Q6H PRN Antonieta Pert, MD      . magnesium hydroxide (MILK OF MAGNESIA) suspension 30 mL  30 mL Oral Daily PRN Rankin, Shuvon B, NP      . paliperidone (INVEGA SUSTENNA) injection 156 mg  156 mg Intramuscular Once Antonieta Pert, MD      . paliperidone (INVEGA) 24 hr tablet 9 mg  9 mg Oral QHS Antonieta Pert, MD   9 mg at 09/28/20 2310  . traZODone (DESYREL) tablet 100 mg  100 mg Oral QHS Rankin, Shuvon B, NP   100 mg at 09/28/20 2310   PTA Medications: Medications Prior to Admission  Medication Sig Dispense Refill Last Dose  . cloNIDine (CATAPRES) 0.1 MG tablet Take 1 tablet (0.1 mg total) by mouth 2 (two) times daily. 60 tablet 0   . haloperidol (HALDOL) 5 MG tablet Take 1 tablet (5 mg total) by mouth 2 (two) times daily. 60 tablet 0   . LORazepam (ATIVAN) 1 MG tablet Take 1 tablet (1 mg total) by mouth 3 (three) times daily. 90 tablet 0   . paliperidone (INVEGA SUSTENNA) 156 MG/ML SUSY injection Inject 1 mL (156 mg total) into the muscle every 30 (thirty) days. 1.2 mL 1   . paliperidone (INVEGA) 9 MG 24 hr tablet Take 1 tablet (9 mg total) by mouth at bedtime. 30  tablet 0   . traZODone (DESYREL) 100 MG tablet Take 1 tablet (100 mg total) by mouth at bedtime. 30 tablet 0     Musculoskeletal: Strength & Muscle Tone: within normal limits Gait & Station: normal Patient leans: N/A  Psychiatric Specialty Exam: Physical Exam Vitals and nursing note reviewed.  HENT:      Head: Normocephalic and atraumatic.  Pulmonary:     Effort: Pulmonary effort is normal.  Neurological:     General: No focal deficit present.     Review of Systems  Blood pressure 123/66, pulse 100, temperature 98.8 F (37.1 C), temperature source Oral, resp. rate 20, last menstrual period 09/13/2020, SpO2 100 %.There is no height or weight on file to calculate BMI.  General Appearance: Disheveled  Eye Contact:  None  Speech:  Essentially mute  Volume:  Essentially mute  Mood:  Dysphoric  Affect:  Flat  Thought Process:  Disorganized and Descriptions of Associations: Loose  Orientation:  Negative  Thought Content:  Delusions, Hallucinations: Auditory, Paranoid Ideation and Rumination  Suicidal Thoughts:  No  Homicidal Thoughts:  No  Memory:  Immediate;   Poor Recent;   Poor Remote;   Poor  Judgement:  Poor  Insight:  Lacking  Psychomotor Activity:  Psychomotor Retardation  Concentration:  Concentration: Poor  Recall:  Poor  Fund of Knowledge:  Poor  Language:  Poor  Akathisia:  Negative  Handed:  Right  AIMS (if indicated):     Assets:  Desire for Improvement Resilience Social Support  ADL's:  Impaired  Cognition:  WNL  Sleep:       Treatment Plan Summary: Daily contact with patient to assess and evaluate symptoms and progress in treatment, Medication management and Plan : Patient is seen and examined.  Patient is a 24 year old female with the above-stated past psychiatric history who was readmitted to the hospital secondary to noncompliance with medication and recurrence of psychosis including catatonic symptoms associated with schizophrenia.  She will be admitted to the hospital.  She will be integrated in the milieu.  She will be encouraged to attend groups.  As stated above, she will be placed on haloperidol and Ativan IM or p.o. as she had been previously to assist with her catatonia.  Her oral paliperidone will be continued at 9 mg p.o. nightly.  I have discussed with  pharmacy the possibility of adding long-acting paliperidone 156 mg today.  That would bring her dose to the equivalent of receiving the 234 mg dosage, followed by the 156 mg dosage.  Hopefully that will give us a chance to improve her compliance.  Once again her hygiene is very poor, and she has terrible body odor.  Once she is a little bit more flexible we will get her in the shower as well.  Review of her admission laboratories revealed normal electrolytes with a mildly elevated creatinine at 1.15.  If she continues to have poor p.o. intake we may have to send her to the emergency department for IV fluids.  Her liver function enzymes were normal.  Her white count was mildly elevated at 10.9.  The rest of her CBC was normal and her differential was positive for a mild elevation of absolute neutrophil count.  Beta-hCG was less than 1.  Urinalysis revealed 5 mg/dl of ketones, moderate leukocytes.  She did have proteinuria with 100 mg per DL.  There were no bacteria seen.  There were 6-10 white blood cells present.  Once she is up and around we  will repeat her urinalysis to make sure there is no infection.  Currently her vital signs are stable, she is afebrile.  Pulse oximetry on room air was 100%.  Observation Level/Precautions:  15 minute checks  Laboratory:  Chemistry Profile  Psychotherapy:    Medications:    Consultations:    Discharge Concerns:    Estimated LOS:  Other:     Physician Treatment Plan for Primary Diagnosis: <principal problem not specified> Long Term Goal(s): Improvement in symptoms so as ready for discharge  Short Term Goals: Ability to identify changes in lifestyle to reduce recurrence of condition will improve, Ability to verbalize feelings will improve, Ability to demonstrate self-control will improve, Ability to identify and develop effective coping behaviors will improve, Ability to maintain clinical measurements within normal limits will improve and Compliance with prescribed  medications will improve  Physician Treatment Plan for Secondary Diagnosis: Active Problems:   Schizophrenia (HCC)   Schizophrenia, paranoid type (HCC)  Long Term Goal(s): Improvement in symptoms so as ready for discharge  Short Term Goals: Ability to identify changes in lifestyle to reduce recurrence of condition will improve, Ability to verbalize feelings will improve, Ability to demonstrate self-control will improve, Ability to identify and develop effective coping behaviors will improve, Ability to maintain clinical measurements within normal limits will improve and Compliance with prescribed medications will improve  I certify that inpatient services furnished can reasonably be expected to improve the patient's condition.    Antonieta Pert, MD 12/29/20211:15 PM

## 2020-09-29 NOTE — [facility] Suicide Risk Assessment (Signed)
[facility] Admission Suicide Risk Assessment   Nursing information obtained from:  Patient Demographic factors:  Low socioeconomic status,Adolescent or young adult,Unemployed Current Mental Status:  NA Loss Factors:  NA Historical Factors:  Impulsivity Risk Reduction Factors:  Religious beliefs about death,Living with another person, especially a relative  Total Time spent with patient: 30 minutes Principal Problem: <principal problem not specified> Diagnosis:  Active Problems:   Schizophrenia (HCC)   Schizophrenia, paranoid type (HCC)  Subjective Data: Patient is seen and examined.  Patient is a 24 year old female with a longstanding past psychiatric history significant for schizophrenia well-known to this facility.  The patient was recently admitted secondary to noncompliance with medications.  She had become catatonic, had been placed on Haldol with Ativan.  She was placed on oral paliperidone, and received an injectable long-acting paliperidone 156 mg while waiting for admission to our facility as well as 1 injection during the course of the hospitalization.  Her family brought her back to the emergency department shortly thereafter again secondary to noncompliance with her medications.  The patient presented by EMS with altered mental status.  She was essentially catatonic on 09/27/20.  She told the evaluator's in the emergency department that she came to the emergency department because her parents were not being "nice to her".  On examination today she is essentially catatonic again.  So far since she has arrived on the unit she has received Haldol 10 mg p.o. or IM every 12 hours as well as lorazepam 2 mg p.o. or IM twice daily as well.  That helped relieve her catatonia on her last hospitalization.  Of note, in the assessment note in the emergency department the mother confirmed that she is obtaining guardianship which had been steadily declined over the last year.  The mother reported that the  patient had only been sleeping 4 to 5 hours a night, and did clearly appear to be responding to internal stimuli.  She was admitted to the hospital for evaluation and stabilization.  Continued Clinical Symptoms:  Alcohol Use Disorder Identification Test Final Score (AUDIT): 0 The "Alcohol Use Disorders Identification Test", Guidelines for Use in Primary Care, Second Edition.  World Science writer [facility]). Score between 0-7:  no or low risk or alcohol related problems. Score between 8-15:  moderate risk of alcohol related problems. Score between 16-19:  high risk of alcohol related problems. Score 20 or above:  warrants further diagnostic evaluation for alcohol dependence and treatment.   CLINICAL FACTORS:   Schizophrenia:   Less than 70 years old Paranoid or undifferentiated type Currently Psychotic Previous Psychiatric Diagnoses and Treatments   Musculoskeletal: Strength & Muscle Tone: within normal limits Gait & Station: shuffle Patient leans: N/A  Psychiatric Specialty Exam: Physical Exam Vitals reviewed.  HENT:     Head: Normocephalic.     Review of Systems  Blood pressure 123/66, pulse 100, temperature 98.8 F (37.1 C), temperature source Oral, resp. rate 20, last menstrual period 09/13/2020, SpO2 100 %.There is no height or weight on file to calculate BMI.  General Appearance: Disheveled  Eye Contact:  None  Speech:  Essentially mute  Volume:  Essentially mute  Mood:  Dysphoric  Affect:  Flat  Thought Process:  Disorganized and Descriptions of Associations: Loose  Orientation:  Negative  Thought Content:  Delusions, Hallucinations: Auditory and Paranoid Ideation  Suicidal Thoughts:  No  Homicidal Thoughts:  No  Memory:  Immediate;   Poor Recent;   Poor Remote;   Poor  Judgement:  Poor  Insight:  Lacking  Psychomotor Activity:  Psychomotor Retardation  Concentration:  Concentration: Poor and Attention Span: Poor  Recall:  Poor  Fund of Knowledge:  Poor   Language:  Poor  Akathisia:  Negative  Handed:  Right  AIMS (if indicated):     Assets:  Resilience  ADL's:  Impaired  Cognition:  WNL  Sleep:         COGNITIVE FEATURES THAT CONTRIBUTE TO RISK:  Thought constriction (tunnel vision)    SUICIDE RISK:   Minimal: No identifiable suicidal ideation.  Patients presenting with no risk factors but with morbid ruminations; may be classified as minimal risk based on the severity of the depressive symptoms  PLAN OF CARE: Patient is seen and examined.  Patient is a 24 year old female with the above-stated past psychiatric history who was readmitted to the hospital secondary to noncompliance with medication and recurrence of psychosis including catatonic symptoms associated with schizophrenia.  She will be admitted to the hospital.  She will be integrated in the milieu.  She will be encouraged to attend groups.  As stated above, she will be placed on haloperidol and Ativan IM or p.o. as she had been previously to assist with her catatonia.  Her oral paliperidone will be continued at 9 mg p.o. nightly.  I have discussed with pharmacy the possibility of adding long-acting paliperidone 156 mg today.  That would bring her dose to the equivalent of receiving the 234 mg dosage, followed by the 156 mg dosage.  Hopefully that will give [place] a chance to improve her compliance.  Once again her hygiene is very poor, and she has terrible body odor.  Once she is a little bit more flexible we will get her in the shower as well.  Review of her admission laboratories revealed normal electrolytes with a mildly elevated creatinine at 1.15.  If she continues to have poor p.o. intake we may have to send her to the emergency department for IV fluids.  Her liver function enzymes were normal.  Her white count was mildly elevated at 10.9.  The rest of her CBC was normal and her differential was positive for a mild elevation of absolute neutrophil count.  Beta-hCG was less than 1.   Urinalysis revealed 5 mg/dl of ketones, moderate leukocytes.  She did have proteinuria with 100 mg per DL.  There were no bacteria seen.  There were 6-10 white blood cells present.  Once she is up and around we will repeat her urinalysis to make sure there is no infection.  Currently her vital signs are stable, she is afebrile.  Pulse oximetry on room air was 100%.  I certify that inpatient services furnished can reasonably be expected to improve the patient's condition.   Antonieta Pert, MD 09/29/2020, 10:40 AM

## 2020-09-29 NOTE — Progress Notes (Signed)
Pt observed with auditory hallucinations and hyper-religious this shift "Lindsey Richmond get out of here, get out of this place" while starring at room window. Affect is blunted with intermittent inappropriate smiles and whispers when responding. Denies SI, HI and pain when assessed "no". Continues to need frequent verbal redirections related to spontaneous running, elopement attempts off unit. Observed to be disruptive in milieu, blocking unit entrance door, delaying peers off unit for meals.  Continued support and encouragement offered. All medications given as ordered. Q 15 minutes safety checks maintained.  Pt tolerates meals and medications well. Refused all medications via PO route but is compliant with IM routes. Safety maintained on unit.

## 2020-09-29 NOTE — B[facility] Counselor (Signed)
CSW attempted to complete PSA, however was unable to be completed due to this patient being asleep and unable to be awoken.   Ruthann Cancer MSW, LCSW Clincal Social Worker  [facility]

## 2020-09-29 NOTE — [facility] Suicide Risk Assessment (Addendum)
[facility] INPATIENT:  Family/Significant Other Suicide Prevention Education  Suicide Prevention Education:  Contact Attempts: Mother, Keyanna Sandefer [phone]), has been identified by the patient as the family member/significant other with whom the patient will be residing, and identified as the person(s) who will aid the patient in the event of a mental health crisis.  With written consent from the patient, two attempts were made to provide suicide prevention education, prior to and/or following the patient's discharge.  We were unsuccessful in providing suicide prevention education.  A suicide education pamphlet was given to the patient to share with family/significant other.   Date and time of first attempt: 09/29/20 at 2:25PM CSW left a HIPAA compliant voicemail for Hisako Bugh. Date and time of second attempt: 09/30/2020 at 9:55am left a HIPAA compliant voicemail for Heide Scales.    Ruthann Cancer MSW, LCSW Clincal Social Worker  [facility]

## 2020-09-29 NOTE — Progress Notes (Signed)
Pt woke up and came to the medication window for her HS medications. Pt continued to be suspicious , but pt took medications PO    09/29/20 2200  Psych Admission Type (Psych Patients Only)  Admission Status Involuntary  Psychosocial Assessment  Patient Complaints None  Eye Contact Avertive  Facial Expression Fixed smile  Affect Appropriate to circumstance  Speech Logical/coherent  Interaction Forwards little  Motor Activity Slow  Appearance/Hygiene Poor hygiene;In scrubs  Behavior Characteristics Appropriate to situation  Mood Suspicious;Anxious  Thought Process  Coherency Disorganized  Content Preoccupation  Delusions None reported or observed  Perception Derealization  Hallucination None reported or observed (Appears to be responding to internal stimuli)  Judgment Impaired  Confusion Mild  Danger to Self  Current suicidal ideation? Denies  Danger to Others  Danger to Others None reported or observed  Danger to Others Abnormal  Harmful Behavior to others No threats or harm toward other people  Destructive Behavior No threats or harm toward property

## 2020-09-29 NOTE — Progress Notes (Signed)
Pt got up from her sleep, rand to the locked doors and tried to get out, pt was re-directed to her room.

## 2020-09-30 MED ORDER — HALOPERIDOL 5 MG PO TABS
10.0000 mg | ORAL_TABLET | Freq: Three times a day (TID) | ORAL | Status: DC
  Filled 2020-09-30 (×5): qty 2

## 2020-09-30 MED ORDER — HALOPERIDOL LACTATE 5 MG/ML IJ SOLN
10.0000 mg | Freq: Three times a day (TID) | INTRAMUSCULAR | Status: DC
  Administered 2020-09-30: 18:00:00 10 mg via INTRAMUSCULAR
  Filled 2020-09-30 (×5): qty 2

## 2020-09-30 NOTE — Progress Notes (Signed)
Pt awake, standing at unit entrance door with blunted affect,  blank stares and elective mutism at this time. Pt succeeded in running off unit X1 this shift when unit secretary opened the door from the opposite side. Pt ran to the other unit, banging on exit doors trying to leave the building. Remains preoccupied with intermittent yelling, screaming. Appetite is fair. Combative with Clinical research associate X1 during medication administration this afternoon but was redirectable. No gestures of self harm behavior noted this shift.  Q 15 minutes safety checks maintained. All medications given IM as ordered as pt continues to refused via PO route. Emotional support, reassurance and encouragement provided to pt throughout this shift.  Pt remains safe on unit. Ambulatory with a steady gait.

## 2020-09-30 NOTE — Progress Notes (Signed)
[facility] MD Progress Note  09/30/2020 10:28 AM Charleen Madera  MRN:  [id number] Subjective: Patient is a 24 year old female with a longstanding past psychiatric history significant for schizophrenia who was once again noncompliant with her medications and admitted on 09/29/2020 secondary to psychosis.  Objective: Patient is seen and examined.  Patient is a 24 year old female with the above-stated past psychiatric history who is seen in follow-up.  She is essentially unchanged from yesterday.  She has periods of time where she is catatonic and will not move or make eye contact or speak to you, and other times becomes hyper agitated and runs towards the doors and attempts to escape the unit.  This morning for me she is catatonic and laying there.  She is eating a muffin when I walked in the room, and then becomes volitionally catatonic.  Yesterday she was placed in the forced medication protocol, and started on haloperidol 10 mg p.o. or IM twice daily.  She also continues to get the lorazepam 2 mg twice daily p.o. or IM for catatonia.  She also received the 156 mg dosage of paliperidone on 12/29.  She continues also received paliperidone 9 mg p.o. nightly.  Her vital signs are stable, she is afebrile.  Her pulse oximetry on room air is 100%.  Nursing notes show she slept 8.5 hours last night.  EKG showed a sinus rhythm with a QTc interval within normal limits.  Principal Problem: <principal problem not specified> Diagnosis: Active Problems:   Schizophrenia (HCC)   Schizophrenia, paranoid type (HCC)  Total Time spent with patient: 20 minutes  Past Psychiatric History: See admission H&P  Past Medical History:  Past Medical History:  Diagnosis Date  . Anxiety   . Depression   . Oppositional defiant behavior   . Schizo affective schizophrenia (HCC)   . Schizophrenia (HCC)    History reviewed. No pertinent surgical history. Family History:  Family History  Problem Relation Age of Onset  . Heart failure  Mother   . Hypertension Mother   . Diabetes Father    Family Psychiatric  History: See admission H&P Social History:  Social History   Substance and Sexual Activity  Alcohol Use No     Social History   Substance and Sexual Activity  Drug Use No    Social History   Socioeconomic History  . Marital status: Single    Spouse name: Not on file  . Number of children: Not on file  . Years of education: Not on file  . Highest education level: Not on file  Occupational History  . Occupation: Unemployed  Tobacco Use  . Smoking status: Never Smoker  . Smokeless tobacco: Never Used  Vaping Use  . Vaping Use: Never used  Substance and Sexual Activity  . Alcohol use: No  . Drug use: No  . Sexual activity: Never    Birth control/protection: None  Other Topics Concern  . Not on file  Social History Narrative   ** Merged History Encounter **       Pt lives with her mother, brother, and father.  She receives outpatient psychiatric care through [facility]   Social Determinants of Health   Financial Resource Strain: Not on file  Food Insecurity: Not on file  Transportation Needs: Not on file  Physical Activity: Not on file  Stress: Not on file  Social Connections: Not on file   Additional Social History:  Sleep: Good  Appetite:  Fair  Current Medications: Current Facility-Administered Medications  Medication Dose Route Frequency Provider Last Rate Last Admin  . acetaminophen (TYLENOL) tablet 650 mg  650 mg Oral Q6H PRN Antonieta Pert, MD      . alum & mag hydroxide-simeth (MAALOX/MYLANTA) 200-200-20 MG/5ML suspension 30 mL  30 mL Oral Q4H PRN Antonieta Pert, MD      . benztropine (COGENTIN) tablet 1 mg  1 mg Oral BID Antonieta Pert, MD       Or  . benztropine mesylate (COGENTIN) injection 1 mg  1 mg Intramuscular BID Antonieta Pert, MD   1 mg at 09/30/20 8756  . haloperidol (HALDOL) tablet 10 mg  10 mg Oral Q4H PRN Antonieta Pert, MD       And  . benztropine (COGENTIN) tablet 1 mg  1 mg Oral Q4H PRN Antonieta Pert, MD      . diphenhydrAMINE (BENADRYL) capsule 25 mg  25 mg Oral Q4H PRN Antonieta Pert, MD       Or  . diphenhydrAMINE (BENADRYL) injection 25 mg  25 mg Intramuscular Q6H PRN Antonieta Pert, MD      . haloperidol (HALDOL) tablet 10 mg  10 mg Oral BID Antonieta Pert, MD       Or  . haloperidol lactate (HALDOL) injection 10 mg  10 mg Intramuscular BID Antonieta Pert, MD   10 mg at 09/30/20 0859  . LORazepam (ATIVAN) tablet 2 mg  2 mg Oral BID Antonieta Pert, MD       Or  . LORazepam (ATIVAN) injection 2 mg  2 mg Intramuscular BID Antonieta Pert, MD   2 mg at 09/30/20 4332  . LORazepam (ATIVAN) injection 2 mg  2 mg Intramuscular Q6H PRN Antonieta Pert, MD   2 mg at 09/29/20 1450  . magnesium hydroxide (MILK OF MAGNESIA) suspension 30 mL  30 mL Oral Daily PRN Rankin, Shuvon B, NP      . paliperidone (INVEGA) 24 hr tablet 9 mg  9 mg Oral QHS Antonieta Pert, MD   9 mg at 09/29/20 2133  . traZODone (DESYREL) tablet 100 mg  100 mg Oral QHS Rankin, Shuvon B, NP   100 mg at 09/29/20 2133    Lab Results: No results found for this or any previous visit (from the past 48 hour(s)).  Blood Alcohol level:  Lab Results  Component Value Date   ETH <10 09/27/2020   ETH <10 09/09/2020    Metabolic Disorder Labs: Lab Results  Component Value Date   HGBA1C 5.6 12/27/2019   MPG 114.02 12/27/2019   MPG 105.41 12/04/2019   Lab Results  Component Value Date   PROLACTIN 57.7 (H) 12/04/2019   PROLACTIN 75.7 (H) 10/23/2019   Lab Results  Component Value Date   CHOL 145 12/27/2019   TRIG 59 12/27/2019   HDL 24 (L) 12/27/2019   CHOLHDL 6.0 12/27/2019   VLDL 12 12/27/2019   LDLCALC 109 (H) 12/27/2019   LDLCALC 100 (H) 12/04/2019    Physical Findings: AIMS: Facial and Oral Movements Muscles of Facial Expression: None, normal Lips and Perioral Area: None,  normal Jaw: None, normal Tongue: None, normal,Extremity Movements Upper (arms, wrists, hands, fingers): None, normal Lower (legs, knees, ankles, toes): None, normal, Trunk Movements Neck, shoulders, hips: None, normal, Overall Severity Severity of abnormal movements (highest score from questions above): None, normal Incapacitation due to abnormal movements: None, normal  Patient's awareness of abnormal movements (rate only patient's report): No Awareness, Dental Status Current problems with teeth and/or dentures?: No Does patient usually wear dentures?: No  CIWA:    COWS:     Musculoskeletal: Strength & Muscle Tone: within normal limits Gait & Station: broad based Patient leans: N/A  Psychiatric Specialty Exam: Physical Exam Vitals and nursing note reviewed.  Constitutional:      Appearance: She is obese.  HENT:     Head: Normocephalic and atraumatic.  Pulmonary:     Effort: Pulmonary effort is normal.  Neurological:     General: No focal deficit present.     Mental Status: She is alert.     Review of Systems  Blood pressure 123/66, pulse 100, temperature 98.8 F (37.1 C), temperature source Oral, resp. rate 20, last menstrual period 09/13/2020, SpO2 100 %.There is no height or weight on file to calculate BMI.  General Appearance: Disheveled  Eye Contact:  Minimal  Speech:  Essentially mute  Volume:  Essentially mute  Mood:  Dysphoric  Affect:  Flat  Thought Process:  Disorganized and Descriptions of Associations: Loose  Orientation:  Negative  Thought Content:  Delusions, Hallucinations: Auditory and Paranoid Ideation  Suicidal Thoughts:  No  Homicidal Thoughts:  No  Memory:  Immediate;   Poor Recent;   Poor Remote;   Poor  Judgement:  Impaired  Insight:  Lacking  Psychomotor Activity:  Decreased and Psychomotor Retardation  Concentration:  Concentration: Poor and Attention Span: Poor  Recall:  Poor  Fund of Knowledge:  Poor  Language:  Poor  Akathisia:   Negative  Handed:  Right  AIMS (if indicated):     Assets:  Desire for Improvement Resilience Social Support  ADL's:  Impaired  Cognition:  WNL  Sleep:  Number of Hours: 8.5     Treatment Plan Summary: Daily contact with patient to assess and evaluate symptoms and progress in treatment, Medication management and Plan : Patient is seen and examined.  Patient is a 24 year old female with the above-stated past psychiatric history who is seen in follow-up.   Diagnosis: 1.  Schizophrenia  Pertinent findings on examination today: 1.  Patient rotates between being hyper agitated and trying to escape and then becomes essentially catatonic.  Plan: 1.  Continue Cogentin 1 mg p.o. or IM twice daily for side effects of medication. 2.  Continue Benadryl 25 mg p.o. or IM every 4 hours as needed sleep, agitation. 3.  Continue haloperidol 10 mg p.o. or IM twice daily for psychosis. 4.  Continue lorazepam 2 mg IM every 6 hours as needed agitation. 5.  Continue Haldol 10 mg and Cogentin 1 mg p.o. or IM every 4 hours as needed agitation or tremors. 6.  Patient received the paliperidone long-acting injection 156 mg on 09/29/2020.  This is for psychosis. 7.  Continue paliperidone 9 mg p.o. nightly for psychosis for now. 8.  Continue trazodone 100 mg p.o. nightly for insomnia. 9.  Disposition planning-in progress, apparently patient is not eligible for group home.  Antonieta Pert, MD 09/30/2020, 10:28 AM

## 2020-09-30 NOTE — Progress Notes (Signed)
Pt continues to be obsessed with going out the doors to the unit. Pt made unit restriction due to pt being hard to redirect at times.      09/30/20 2100  Psych Admission Type (Psych Patients Only)  Admission Status Involuntary  Psychosocial Assessment  Patient Complaints Restlessness  Eye Contact Avertive  Facial Expression Fixed smile  Affect Appropriate to circumstance  Speech Logical/coherent  Interaction Forwards little  Motor Activity Slow  Appearance/Hygiene Poor hygiene;In scrubs  Behavior Characteristics Agitated;Anxious;Unwilling to participate  Mood Labile;Suspicious;Preoccupied  Thought Process  Coherency Disorganized  Content Preoccupation  Delusions None reported or observed  Perception Derealization  Hallucination None reported or observed (Appears to be responding to internal stimuli)  Judgment Impaired  Confusion Mild  Danger to Self  Current suicidal ideation? Denies  Danger to Others  Danger to Others None reported or observed  Danger to Others Abnormal  Harmful Behavior to others No threats or harm toward other people  Destructive Behavior No threats or harm toward property

## 2020-09-30 NOTE — Progress Notes (Signed)
Recreation Therapy Notes  Date: 12.30.21 Time: 1000 Location: 500 Hall Dayroom  Group Topic: Self Reflection  Goal Area(s) Addresses:  Patient will identify things learned and accomplished this year. Patient will identify things they want to accomplish in the new year.  Intervention: Worksheet, pencils  Activity: New Years Resolutions.  Patients were to identify what they did well this year and what ways they could progress in the new year.  Patients would then identify their new year's resolution and why it is important to them.  Education:  Self-esteem, Discharge planning  Education Outcome: Acknowledges understanding/In group clarification offered/Needs additional education  Clinical Observations/Feedback: Pt did not attend group session.     Caroll Rancher, LRT/CTRS    Caroll Rancher A 09/30/2020 11:54 AM

## 2020-10-01 MED ORDER — FLUPHENAZINE HCL 2.5 MG/ML IJ SOLN: 2.5000 mg | Freq: Three times a day (TID) | INTRAMUSCULAR | Status: DC | PRN

## 2020-10-01 MED ORDER — FLUPHENAZINE HCL 5 MG PO TABS
5.0000 mg | ORAL_TABLET | Freq: Three times a day (TID) | ORAL | Status: DC
  Administered 2020-10-01: 5 mg via ORAL
  Filled 2020-10-01 (×7): qty 1

## 2020-10-01 NOTE — Progress Notes (Signed)
D:  Dominic continues to remain isolative to her room.  She will come out occasionally to try to get out if the front doors.  No interaction with staff or peers.  She will not answer questions and just stares at you.  She refused her AM medication and was given forced medication order.  She took injections well without any difficulty.  She denied any pain or discomfor and appeared to be in no physical distress.  She appears to be responding to internal stimuli. A:  1:1 with RN for support and encouragement.  Medications as ordered.  Q 15 minute checks maintained for safety.  Encouraged participation in group and unit activities.   R:  Hailyn remains safe on the unit.  We will continue to monitor the progress towards her goals.

## 2020-10-01 NOTE — Progress Notes (Signed)
[facility] Second Physician Opinion Progress Note for Medication Administration to Non-consenting Patients (For Involuntarily Committed Patients)  Patient: Lindsey Richmond Date of Birth: [birth year] MRN: [id number]  Reason for the Medication: The patient, without the benefit of the specific treatment measure, is incapable of participating in any available treatment plan that will give the patient a realistic opportunity of improving the patient's condition. There is, without the benefit of the specific treatment measure, a significant possibility that the patient will harm self or others before improvement of the patient's condition is realized.  Consideration of Side Effects: Consideration of the side effects related to the medication plan has been given.  Rationale for Medication Administration: This is a 24 year old female with longstanding psychiatric history of schizophrenia admitted to [facility] in the context of worsening psychosis secondary to noncompliance to her medications.  Writer was asked by primary team attending to provide second opinion for medication administration since she is refusing to take medications.  Writer reviewed chart prior to evaluation.  Writer evaluated patient at the bedside.  She was sitting on the bed in her room, staring straight to the window, and mute. She did not respond or moved when Clinical research associate called her name, asked questions, or asked if Clinical research associate can ask questions to her. She kept staring at the window. She was +ve for stupor, mutism, posturing and staring for catatonia.   She appears catatonic in the context of her decompensated schizophrenia and unable to function. She is also harm to self in the context of being unable to function. Writer agree with medication management for patient to improve her symptoms of schizophrenia.       Darcel Smalling, MD 10/01/20  3:05 PM   This documentation is good for (7) seven days from the date of the MD signature. New documentation must  be completed every seven (7) days with detailed justification in the medical record if the patient requires continued non-emergent administration of psychotropic medications.

## 2020-10-01 NOTE — [facility] Group Notes (Signed)
Adult Psychoeducational Group Note  Date:  10/01/2020 Time:  12:34 PM  Group Topic/Focus:  Goals Group:   The focus of this group is to help patients establish daily goals to achieve during treatment and discuss how the patient can incorporate goal setting into their daily lives to aide in recovery.  Participation Level:  Did Not Attend  Lindsey Richmond 10/01/2020, 12:34 PM

## 2020-10-01 NOTE — Progress Notes (Addendum)
[facility] MD Progress Note  10/01/2020 10:37 AM Lindsey Richmond  MRN:  [id number] Subjective:  Patient is a 24 year old female with a longstanding past psychiatric history significant for schizophrenia who was once again noncompliant with her medications and admitted on 09/29/2020 secondary to psychosis.  Objective: Patient is seen and examined.  Patient is a 24 year old female with the above-stated past psychiatric history who is seen in follow-up.  Unfortunately she still is essentially unchanged.  She continues to go from episodes of agitation and running up and down the hallway, to volitionally mute and catatonic-like.  Because of the lack of efficacy of the previous antipsychotic medications, I started her on Prolixin 2.5 mg IM every 8 hours as needed agitation as well as Prolixin 5 mg p.o. 3 times daily today.  She continues on lorazepam twice daily IM or p.o. for her catatonia as well.  She also continues on paliperidone 9 mg p.o. nightly, but it does not appear that is really having a great effect.  Her vital signs are stable, she is afebrile.  She continues to sleep well at 8.5 hours last night.  No new laboratories.  Principal Problem: <principal problem not specified> Diagnosis: Active Problems:   Schizophrenia (HCC)   Schizophrenia, paranoid type (HCC)  Total Time spent with patient: 20 minutes  Past Psychiatric History: See admission H&P  Past Medical History:  Past Medical History:  Diagnosis Date  . Anxiety   . Depression   . Oppositional defiant behavior   . Schizo affective schizophrenia (HCC)   . Schizophrenia (HCC)    History reviewed. No pertinent surgical history. Family History:  Family History  Problem Relation Age of Onset  . Heart failure Mother   . Hypertension Mother   . Diabetes Father    Family Psychiatric  History: See admission H&P Social History:  Social History   Substance and Sexual Activity  Alcohol Use No     Social History   Substance and Sexual  Activity  Drug Use No    Social History   Socioeconomic History  . Marital status: Single    Spouse name: Not on file  . Number of children: Not on file  . Years of education: Not on file  . Highest education level: Not on file  Occupational History  . Occupation: Unemployed  Tobacco Use  . Smoking status: Never Smoker  . Smokeless tobacco: Never Used  Vaping Use  . Vaping Use: Never used  Substance and Sexual Activity  . Alcohol use: No  . Drug use: No  . Sexual activity: Never    Birth control/protection: Lindsey Richmond  Other Topics Concern  . Not on file  Social History Narrative   ** Merged History Encounter **       Pt lives with her mother, brother, and father.  She receives outpatient psychiatric care through [facility].   Social Determinants of Health   Financial Resource Strain: Not on file  Food Insecurity: Not on file  Transportation Needs: Not on file  Physical Activity: Not on file  Stress: Not on file  Social Connections: Not on file   Additional Social History:                         Sleep: Good  Appetite:  Fair  Current Medications: Current Facility-Administered Medications  Medication Dose Route Frequency Provider Last Rate Last Admin  . acetaminophen (TYLENOL) tablet 650 mg  650 mg Oral Q6H PRN Antonieta Pert, MD      .  alum & mag hydroxide-simeth (MAALOX/MYLANTA) 200-200-20 MG/5ML suspension 30 mL  30 mL Oral Q4H PRN Antonieta Pert, MD      . benztropine (COGENTIN) tablet 1 mg  1 mg Oral BID Antonieta Pert, MD       Or  . benztropine mesylate (COGENTIN) injection 1 mg  1 mg Intramuscular BID Antonieta Pert, MD   1 mg at 10/01/20 1021  . diphenhydrAMINE (BENADRYL) capsule 25 mg  25 mg Oral Q4H PRN Antonieta Pert, MD       Or  . diphenhydrAMINE (BENADRYL) injection 25 mg  25 mg Intramuscular Q6H PRN Antonieta Pert, MD   25 mg at 10/01/20 0031  . fluPHENAZine (PROLIXIN) injection 2.5 mg  2.5 mg Intramuscular Q8H PRN  Antonieta Pert, MD      . fluPHENAZine (PROLIXIN) tablet 5 mg  5 mg Oral TID Antonieta Pert, MD      . LORazepam (ATIVAN) tablet 2 mg  2 mg Oral BID Antonieta Pert, MD       Or  . LORazepam (ATIVAN) injection 2 mg  2 mg Intramuscular BID Antonieta Pert, MD   2 mg at 10/01/20 1021  . LORazepam (ATIVAN) injection 2 mg  2 mg Intramuscular Q6H PRN Antonieta Pert, MD   2 mg at 10/01/20 0030  . magnesium hydroxide (MILK OF MAGNESIA) suspension 30 mL  30 mL Oral Daily PRN Rankin, Shuvon B, NP      . paliperidone (INVEGA) 24 hr tablet 9 mg  9 mg Oral QHS Antonieta Pert, MD   9 mg at 09/30/20 2056  . traZODone (DESYREL) tablet 100 mg  100 mg Oral QHS Rankin, Shuvon B, NP   100 mg at 09/30/20 2056    Lab Results: No results found for this or any previous visit (from the past 48 hour(s)).  Blood Alcohol level:  Lab Results  Component Value Date   ETH <10 09/27/2020   ETH <10 09/09/2020    Metabolic Disorder Labs: Lab Results  Component Value Date   HGBA1C 5.6 12/27/2019   MPG 114.02 12/27/2019   MPG 105.41 12/04/2019   Lab Results  Component Value Date   PROLACTIN 57.7 (H) 12/04/2019   PROLACTIN 75.7 (H) 10/23/2019   Lab Results  Component Value Date   CHOL 145 12/27/2019   TRIG 59 12/27/2019   HDL 24 (L) 12/27/2019   CHOLHDL 6.0 12/27/2019   VLDL 12 12/27/2019   LDLCALC 109 (H) 12/27/2019   LDLCALC 100 (H) 12/04/2019    Physical Findings: AIMS: Facial and Oral Movements Muscles of Facial Expression: Lindsey Richmond, normal Lips and Perioral Area: Lindsey Richmond, normal Jaw: Lindsey Richmond, normal Tongue: Lindsey Richmond, normal,Extremity Movements Upper (arms, wrists, hands, fingers): Lindsey Richmond, normal Lower (legs, knees, ankles, toes): Lindsey Richmond, normal, Trunk Movements Neck, shoulders, hips: Lindsey Richmond, normal, Overall Severity Severity of abnormal movements (highest score from questions above): Lindsey Richmond, normal Incapacitation due to abnormal movements: Lindsey Richmond, normal Patient's awareness of abnormal movements  (rate only patient's report): No Awareness, Dental Status Current problems with teeth and/or dentures?: No Does patient usually wear dentures?: No  CIWA:    COWS:     Musculoskeletal: Strength & Muscle Tone: abnormal Gait & Station: shuffle Patient leans: N/A  Psychiatric Specialty Exam: Physical Exam Vitals and nursing note reviewed.  HENT:     Head: Normocephalic and atraumatic.  Pulmonary:     Effort: Pulmonary effort is normal.  Neurological:     General: No focal deficit present.  Mental Status: She is alert.     Review of Systems  Blood pressure 139/82, pulse 98, temperature 98.7 F (37.1 C), temperature source Oral, resp. rate 20, last menstrual period 09/13/2020, SpO2 98 %.There is no height or weight on file to calculate BMI.  General Appearance: Disheveled  Eye Contact:  Minimal  Speech:  Essentially mute  Volume:  Essentially mute  Mood:  Dysphoric  Affect:  Flat  Thought Process:  Disorganized and Descriptions of Associations: Loose  Orientation:  Negative  Thought Content:  Delusions and Paranoid Ideation  Suicidal Thoughts:  No  Homicidal Thoughts:  No  Memory:  Immediate;   Poor Recent;   Poor Remote;   Poor  Judgement:  Impaired  Insight:  Lacking  Psychomotor Activity:  Alternating between catatonia and agitation  Concentration:  Concentration: Poor and Attention Span: Poor  Recall:  Poor  Fund of Knowledge:  Poor  Language:  Essentially mute.  Akathisia:  Negative  Handed:  Right  AIMS (if indicated):     Assets:  Desire for Improvement Housing Resilience Social Support  ADL's:  Impaired  Cognition:  Impaired,  Mild  Sleep:  Number of Hours: 8.5     Treatment Plan Summary: Daily contact with patient to assess and evaluate symptoms and progress in treatment, Medication management and Plan : Patient is seen and examined.  Patient is a 24 year old female with the above-stated past psychiatric history who is seen in  follow-up.\  Diagnosis: 1.  Schizophrenia  Pertinent findings on examination today: 1.  Patient is essentially unchanged from yesterday.  She rotates between agitation as well as catatonia.  Plan: 1.  Continue Cogentin 1 mg p.o. or IM twice daily for side effects of medication. 2.  Continue Benadryl 25 mg p.o. or IM every 4 hours as needed sleep, agitation. 3.  Stop Haldol  4.  Continue lorazepam 2 mg IM every 6 hours as needed agitation. 5.  Continue Haldol 10 mg and Cogentin 1 mg p.o. or IM every 4 hours as needed agitation or tremors. 6.  Patient received the paliperidone long-acting injection 156 mg on 09/29/2020.  This is for psychosis. 7.  Continue paliperidone 9 mg p.o. nightly for psychosis for now. 8.  Continue trazodone 100 mg p.o. nightly for insomnia. 9.    Start Prolixin 5 mg p.o. 3 times daily or 2.5 mg IM 3 times daily for psychosis. 10.  Repeat urinalysis. 11.  Disposition planning-patient is reportedly not eligible for group home placement.  We will continue forward with the idea that she will return home.     Antonieta Pert, MD 10/01/2020, 10:37 AM   Addendum: Given that forced medications were necessary on the last hospitalization, I have asked Dr. Jerold Coombe to give his second opinion on forced medications now given the severity of her illness.

## 2020-10-01 NOTE — Progress Notes (Signed)
Pt constantly coming out her room, walking to the window and to the door, having to be redirected numerous times. Pt given PRN IM 2 mg Ativan and 25 mg Benadryl per [facility]

## 2020-10-02 MED ORDER — FLUPHENAZINE HCL 10 MG PO TABS
10.0000 mg | ORAL_TABLET | Freq: Three times a day (TID) | ORAL | Status: DC
  Administered 2020-10-02: 10 mg via ORAL
  Filled 2020-10-02 (×6): qty 1

## 2020-10-02 MED ORDER — FLUPHENAZINE HCL 2.5 MG/ML IJ SOLN
5.0000 mg | Freq: Three times a day (TID) | INTRAMUSCULAR | Status: DC | PRN
  Administered 2020-10-03 (×2): 5 mg via INTRAMUSCULAR
  Filled 2020-10-02: qty 10

## 2020-10-02 MED ORDER — PALIPERIDONE ER 6 MG PO TB24
6.0000 mg | ORAL_TABLET | Freq: Every day | ORAL | Status: DC
  Administered 2020-10-03: 6 mg via ORAL
  Filled 2020-10-02 (×3): qty 1

## 2020-10-02 NOTE — Progress Notes (Signed)
Pt has been mainly isolative to her room tonight resting in her bed. She did wake up to take her medications and then returned to her room. Pt has a flat, blunted affected. She has elective mutism at times. Q 15 min safety checks continue. Pt's safety has been maintained.   10/01/20 2244  Psych Admission Type (Psych Patients Only)  Admission Status Involuntary  Psychosocial Assessment  Patient Complaints None  Eye Contact Avertive  Facial Expression Flat  Affect Flat;Blunted  Speech Soft;Slow;Logical/coherent  Interaction Forwards little;Isolative  Motor Activity Slow  Appearance/Hygiene Body odor;Poor hygiene  Behavior Characteristics Anxious;Appropriate to situation  Mood Anxious;Suspicious;Preoccupied  Thought Process  Coherency Disorganized  Content Preoccupation  Delusions None reported or observed  Perception Derealization  Hallucination None reported or observed (appears to be responding to internal stimuli)  Judgment Impaired  Confusion Mild  Danger to Self  Current suicidal ideation? Denies  Danger to Others  Danger to Others None reported or observed  Danger to Others Abnormal  Harmful Behavior to others No threats or harm toward other people  Destructive Behavior No threats or harm toward property

## 2020-10-02 NOTE — Final Progress Note (Signed)
  D: Patient refused all questions and sat in her room in a partial catatonic state. Pt. Refused all oral meds. Pt. Refused to look at this Clinical research associate. Pt. Agreed to IM meds. Pt. Had breakfast tray on the floor. Pt. Stated "get the fuck out of here" Pt. Walked quickly toward the door. Pt. Attended group.  A:  Pt. Refused all oral medicine but agreed to take the IM Cogentin 1 mg and 2 mg of Ativan IM.  Support and encouragement provided Routine safety checks conducted every 15 minutes. Patient  Informed to notify staff with any concerns.   R: Safety maintained.

## 2020-10-02 NOTE — Progress Notes (Signed)
   10/01/20 2244  COVID-19 Daily Checkoff  Have you had a fever (temp > 37.80C/100F)  in the past 24 hours?  No  COVID-19 EXPOSURE  Have you traveled outside the state in the past 14 days? No  Have you been in contact with someone with a confirmed diagnosis of COVID-19 or PUI in the past 14 days without wearing appropriate PPE? No  Have you been living in the same home as a person with confirmed diagnosis of COVID-19 or a PUI (household contact)? No  Have you been diagnosed with COVID-19? No

## 2020-10-02 NOTE — Progress Notes (Signed)
[facility] MD Progress Note  10/02/2020 10:51 AM Lindsey Richmond  MRN:  [id number] Subjective:  Patient is a 25 year old female with a longstanding past psychiatric history significant for schizophrenia who was once again noncompliant with her medications and admitted on 09/29/2020 secondary to psychosis.  Ejective: Patient is seen and examined.  Patient is a 25 year old female with the above-stated past psychiatric history is seen in follow-up.  Events of yesterday were noted.  She continued to wander out of her room intermittently and then run for the door.  She would stand at the door staring at it waiting to open so she could attempt to elope.  Earlier in the a.m. it was very difficult to get her to go back to her room, but later during the day she was more "flexible" and returning to her room and could be led by hand.  She was seen by Dr Jerold Coombe yesterday, and agreed with forced medications.  This morning on examination she is in one of her semicatatonic states.  She does open her eyes, move her lips to a degree.  She does not speak.  She sitting on the bed.  She did take her breakfast tray, and threw it on the ground and there is a again bacon just sitting on the floor.  Her hygiene remains poor.  Her vital signs are stable, she is afebrile.  Sleep was still recorded is 8.5 hours.  I wrote for a repeat urinalysis yesterday but given her current state it does not appear that it was sent.  I will rerequest that today.  No new laboratories.  Principal Problem: <principal problem not specified> Diagnosis: Active Problems:   Schizophrenia (HCC)   Schizophrenia, paranoid type (HCC)  Total Time spent with patient: 20 minutes  Past Psychiatric History: See admission H&P  Past Medical History:  Past Medical History:  Diagnosis Date  . Anxiety   . Depression   . Oppositional defiant behavior   . Schizo affective schizophrenia (HCC)   . Schizophrenia (HCC)    History reviewed. No pertinent surgical  history. Family History:  Family History  Problem Relation Age of Onset  . Heart failure Mother   . Hypertension Mother   . Diabetes Father    Family Psychiatric  History: See admission H&P Social History:  Social History   Substance and Sexual Activity  Alcohol Use No     Social History   Substance and Sexual Activity  Drug Use No    Social History   Socioeconomic History  . Marital status: Single    Spouse name: Not on file  . Number of children: Not on file  . Years of education: Not on file  . Highest education level: Not on file  Occupational History  . Occupation: Unemployed  Tobacco Use  . Smoking status: Never Smoker  . Smokeless tobacco: Never Used  Vaping Use  . Vaping Use: Never used  Substance and Sexual Activity  . Alcohol use: No  . Drug use: No  . Sexual activity: Never    Birth control/protection: None  Other Topics Concern  . Not on file  Social History Narrative   ** Merged History Encounter **       Pt lives with her mother, brother, and father.  She receives outpatient psychiatric care through [facility].   Social Determinants of Health   Financial Resource Strain: Not on file  Food Insecurity: Not on file  Transportation Needs: Not on file  Physical Activity: Not on file  Stress:  Not on file  Social Connections: Not on file   Additional Social History:                         Sleep: Good  Appetite:  Fair  Current Medications: Current Facility-Administered Medications  Medication Dose Route Frequency Provider Last Rate Last Admin  . acetaminophen (TYLENOL) tablet 650 mg  650 mg Oral Q6H PRN Sharma Covert, MD      . alum & mag hydroxide-simeth (MAALOX/MYLANTA) 200-200-20 MG/5ML suspension 30 mL  30 mL Oral Q4H PRN Sharma Covert, MD      . benztropine (COGENTIN) tablet 1 mg  1 mg Oral BID Sharma Covert, MD   1 mg at 10/01/20 1829   Or  . benztropine mesylate (COGENTIN) injection 1 mg  1 mg Intramuscular BID  Sharma Covert, MD   1 mg at 10/02/20 0932  . diphenhydrAMINE (BENADRYL) capsule 25 mg  25 mg Oral Q4H PRN Sharma Covert, MD       Or  . diphenhydrAMINE (BENADRYL) injection 25 mg  25 mg Intramuscular Q6H PRN Sharma Covert, MD   25 mg at 10/01/20 0031  . fluPHENAZine (PROLIXIN) injection 2.5 mg  2.5 mg Intramuscular Q8H PRN Sharma Covert, MD      . fluPHENAZine (PROLIXIN) tablet 5 mg  5 mg Oral TID Sharma Covert, MD   5 mg at 10/01/20 1829  . LORazepam (ATIVAN) tablet 2 mg  2 mg Oral BID Sharma Covert, MD   2 mg at 10/01/20 1829   Or  . LORazepam (ATIVAN) injection 2 mg  2 mg Intramuscular BID Sharma Covert, MD   2 mg at 10/02/20 0933  . LORazepam (ATIVAN) injection 2 mg  2 mg Intramuscular Q6H PRN Sharma Covert, MD   2 mg at 10/01/20 0030  . magnesium hydroxide (MILK OF MAGNESIA) suspension 30 mL  30 mL Oral Daily PRN Rankin, Shuvon B, NP      . paliperidone (INVEGA) 24 hr tablet 9 mg  9 mg Oral QHS Sharma Covert, MD   9 mg at 10/01/20 2244  . traZODone (DESYREL) tablet 100 mg  100 mg Oral QHS Rankin, Shuvon B, NP   100 mg at 10/01/20 2244    Lab Results: No results found for this or any previous visit (from the past 48 hour(s)).  Blood Alcohol level:  Lab Results  Component Value Date   ETH <10 09/27/2020   ETH <10 55/73/2202    Metabolic Disorder Labs: Lab Results  Component Value Date   HGBA1C 5.6 12/27/2019   MPG 114.02 12/27/2019   MPG 105.41 12/04/2019   Lab Results  Component Value Date   PROLACTIN 57.7 (H) 12/04/2019   PROLACTIN 75.7 (H) 10/23/2019   Lab Results  Component Value Date   CHOL 145 12/27/2019   TRIG 59 12/27/2019   HDL 24 (L) 12/27/2019   CHOLHDL 6.0 12/27/2019   VLDL 12 12/27/2019   LDLCALC 109 (H) 12/27/2019   LDLCALC 100 (H) 12/04/2019    Physical Findings: AIMS: Facial and Oral Movements Muscles of Facial Expression: None, normal Lips and Perioral Area: None, normal Jaw: None, normal Tongue:  None, normal,Extremity Movements Upper (arms, wrists, hands, fingers): None, normal Lower (legs, knees, ankles, toes): None, normal, Trunk Movements Neck, shoulders, hips: None, normal, Overall Severity Severity of abnormal movements (highest score from questions above): None, normal Incapacitation due to abnormal movements: None, normal Patient's  awareness of abnormal movements (rate only patient's report): No Awareness, Dental Status Current problems with teeth and/or dentures?: No Does patient usually wear dentures?: No  CIWA:    COWS:     Musculoskeletal: Strength & Muscle Tone: abnormal Gait & Station: Sitting up on her bed, semicatatonic Patient leans: N/A  Psychiatric Specialty Exam: Physical Exam Vitals and nursing note reviewed.  HENT:     Head: Normocephalic and atraumatic.  Cardiovascular:     Pulses: Normal pulses.  Neurological:     General: No focal deficit present.     Review of Systems  Blood pressure 139/82, pulse 98, temperature 98.7 F (37.1 C), temperature source Oral, resp. rate 20, last menstrual period 09/13/2020, SpO2 98 %.There is no height or weight on file to calculate BMI.  General Appearance: Disheveled  Eye Contact:  Minimal  Speech:  Essentially mute  Volume:  Essentially mute  Mood:  Dysphoric  Affect:  Flat  Thought Process:  NA  Orientation:  Negative  Thought Content:  Delusions and Paranoid Ideation  Suicidal Thoughts:  No  Homicidal Thoughts:  No  Memory:  Immediate;   Poor Recent;   Poor Remote;   Poor  Judgement:  Impaired  Insight:  Lacking  Psychomotor Activity:  Psychomotor Retardation  Concentration:  Concentration: Poor and Attention Span: Poor  Recall:  Poor  Fund of Knowledge:  Poor  Language:  Poor  Akathisia:  Negative  Handed:  Right  AIMS (if indicated):     Assets:  Desire for Improvement Housing Social Support  ADL's:  Impaired  Cognition:  WNL  Sleep:  Number of Hours: 8.5     Treatment Plan  Summary: Daily contact with patient to assess and evaluate symptoms and progress in treatment, Medication management and Plan : Patient is seen and examined.  Patient is a 25 year old female with the above-stated past psychiatric history who is seen in follow-up.   Diagnosis: 1.  Schizophrenia  Pertinent findings on examination today: 1.  Her episodes of agitation seem to be decreasing.  Otherwise she continues to go from an agitated state to a semicatatonic state.  Plan: 1.  Increase Prolixin to 10 mg p.o. 3 times daily or 5 mg IM 3 times daily for psychosis and catatonia. 2.  Decrease oral paliperidone to 6 mg p.o. nightly for psychosis. 3.  Continue Cogentin 1 mg p.o. twice daily or 1 mg p.o. twice daily IM for side effects of medication. 4.  Continue lorazepam 2 mg IM every 6 hours as needed agitation and 2 mg p.o. or IM twice daily for catatonia. 5.  Continue trazodone 100 mg p.o. nightly as needed insomnia. 6.  Reorder urinalysis. 7.  Disposition planning-in progress.  Antonieta Pert, MD 10/02/2020, 10:51 AM

## 2020-10-02 NOTE — [facility] Group Notes (Addendum)
  [facility] LCSW Group Therapy Note  Date/Time:  10/02/2020 11:30AM-12:00PM  Type of Therapy and Topic:  Group Therapy:  Self-Care Wheel  Participation Level:  Active   Description of Group This process group involved patients discussing the importance of self-care in different areas of life (professional, personal, emotional, psychological, spiritual, and physical) in order to achieve healthy life balance.  The group talked about what self-care in each of those areas would constitute and then specifically listed how they want to provide themselves with improved self-care in this new year.      Therapeutic Goals 1. Patient will learn how to break self-care down into various areas of life 2. Patient will participate in generating ideas about healthy self-care options in each category 3. Patients will be supportive of one another and receive support from others 4. Patient will identify one healthy self-care activity to add to his/her life this year  Summary of Patient Progress:  The patient expressed that several goals she has this year are to love herself, lose weight, hope, pray and read a book.  She was able to identify ways in which self-care in different areas would help to reach that goal.  Patient's participation in group was appropriate but somewhat tangential.     Therapeutic Modalities Processing Psychoeducation   Ambrose Mantle, LCSW 10/02/2020 3:33 PM

## 2020-10-02 NOTE — [facility] Group Notes (Signed)
.  Psychoeducational Group Note  Date: 10-02-20 Time: 0900-1000    Goal Setting   Purpose of Group: This group helps to provide patients with the steps of setting a goal that is specific, measurable, attainable, realistic and time specific. A discussion on how we keep ourselves stuck with negative self talk.    Participation Level:  Did not attend   Jazma Pickel A  

## 2020-10-02 NOTE — Progress Notes (Signed)
   10/02/20 2000  Psych Admission Type (Psych Patients Only)  Admission Status Involuntary  Psychosocial Assessment  Patient Complaints Anxiety  Eye Contact Avertive  Facial Expression Flat  Affect Flat;Blunted  Speech Soft;Slow;Logical/coherent  Interaction Forwards little;Isolative  Motor Activity Slow  Appearance/Hygiene Body odor;Poor hygiene  Behavior Characteristics Agressive verbally  Mood Anxious;Depressed  Thought Process  Coherency Disorganized  Content Preoccupation  Delusions None reported or observed  Perception Hallucinations  Hallucination None reported or observed (appears to be responding to internal stimuli)  Judgment Impaired  Confusion Mild  Danger to Self  Current suicidal ideation? Denies  Danger to Others  Danger to Others None reported or observed  Danger to Others Abnormal  Harmful Behavior to others No threats or harm toward other people  Destructive Behavior No threats or harm toward property

## 2020-10-03 MED ORDER — FLUPHENAZINE HCL 10 MG PO TABS
10.0000 mg | ORAL_TABLET | Freq: Three times a day (TID) | ORAL | Status: DC
  Filled 2020-10-03 (×6): qty 1

## 2020-10-03 MED ORDER — FLUPHENAZINE HCL 2.5 MG/ML IJ SOLN
5.0000 mg | Freq: Three times a day (TID) | INTRAMUSCULAR | Status: DC
  Administered 2020-10-03 – 2020-10-04 (×2): 5 mg via INTRAMUSCULAR
  Filled 2020-10-03 (×2): qty 2
  Filled 2020-10-03: qty 10
  Filled 2020-10-03 (×4): qty 2

## 2020-10-03 MED ORDER — FLUPHENAZINE HCL 2.5 MG/ML IJ SOLN: 5.0000 mg | Freq: Three times a day (TID) | INTRAMUSCULAR | Status: DC | PRN

## 2020-10-03 NOTE — Progress Notes (Signed)
[facility] MD Progress Note  10/03/2020 11:20 AM Lindsey Richmond  MRN:  [id number] Subjective:  Patient is a 25 year old female with a longstanding past psychiatric history significant for schizophrenia who was once again noncompliant with her medications and admitted on 09/29/2020 secondary to psychosis.  Objective: Patient is seen and examined.  Patient is a 25 year old female with the above-stated past psychiatric history who is seen in follow-up.  Her semicatatonic state continues to be present.  She also continues to run towards the door to elope, but at least now is a little bit more redirectable.  Unfortunately the orders I put in for injectable Prolixin if she refused the oral were not very clear, so she did not receive the Prolixin injectable or oral last night.  They will give her Prolixin 5 mg IM this AM.  Her hygiene is still very poor and she has significant body odor.  She does make eye contact this morning and again said a few words.  Her blood pressure was elevated this morning is 138/99 and repeat was 144/86.  Her pulse went from 92 to 120.  Nursing notes said that she slept 9 hours, but I suspect that she probably just laid in her room and was thought to be sleeping.  No new laboratories.  I did write for urinalysis, but with the patient is very difficult to collect.  We will see if we can get that done today.  Principal Problem: <principal problem not specified> Diagnosis: Active Problems:   Schizophrenia (HCC)   Schizophrenia, paranoid type ([place])  Total Time spent with patient: 20 minutes  Past Psychiatric History: See admission H&P  Past Medical History:  Past Medical History:  Diagnosis Date  . Anxiety   . Depression   . Oppositional defiant behavior   . Schizo affective schizophrenia ([place])   . Schizophrenia ([place])    History reviewed. No pertinent surgical history. Family History:  Family History  Problem Relation Age of Onset  . Heart failure Mother   . Hypertension Mother    . Diabetes Father    Family Psychiatric  History: See admission H&P Social History:  Social History   Substance and Sexual Activity  Alcohol Use No     Social History   Substance and Sexual Activity  Drug Use No    Social History   Socioeconomic History  . Marital status: Single    Spouse name: Not on file  . Number of children: Not on file  . Years of education: Not on file  . Highest education level: Not on file  Occupational History  . Occupation: Unemployed  Tobacco Use  . Smoking status: Never Smoker  . Smokeless tobacco: Never Used  Vaping Use  . Vaping Use: Never used  Substance and Sexual Activity  . Alcohol use: No  . Drug use: No  . Sexual activity: Never    Birth control/protection: None  Other Topics Concern  . Not on file  Social History Narrative   ** Merged History Encounter **       Pt lives with her mother, brother, and father.  She receives outpatient [facility] care through [facility].   Social Determinants of Health   Financial Resource Strain: Not on file  Food Insecurity: Not on file  Transportation Needs: Not on file  Physical Activity: Not on file  Stress: Not on file  Social Connections: Not on file   Additional Social History:  Sleep: Fair  Appetite:  Fair  Current Medications: Current Facility-Administered Medications  Medication Dose Route Frequency Provider Last Rate Last Admin  . acetaminophen (TYLENOL) tablet 650 mg  650 mg Oral Q6H PRN Antonieta Pert, MD      . alum & mag hydroxide-simeth (MAALOX/MYLANTA) 200-200-20 MG/5ML suspension 30 mL  30 mL Oral Q4H PRN Antonieta Pert, MD      . benztropine (COGENTIN) tablet 1 mg  1 mg Oral BID Antonieta Pert, MD   1 mg at 10/02/20 1657   Or  . benztropine mesylate (COGENTIN) injection 1 mg  1 mg Intramuscular BID Antonieta Pert, MD   1 mg at 10/03/20 0904  . diphenhydrAMINE (BENADRYL) capsule 25 mg  25 mg Oral Q4H PRN Antonieta Pert, MD       Or  . diphenhydrAMINE (BENADRYL) injection 25 mg  25 mg Intramuscular Q6H PRN Antonieta Pert, MD   25 mg at 10/03/20 0251  . fluPHENAZine (PROLIXIN) injection 5 mg  5 mg Intramuscular Q8H PRN Antonieta Pert, MD   5 mg at 10/03/20 0911  . fluPHENAZine (PROLIXIN) tablet 10 mg  10 mg Oral TID Antonieta Pert, MD   10 mg at 10/02/20 1657  . LORazepam (ATIVAN) tablet 2 mg  2 mg Oral BID Antonieta Pert, MD   2 mg at 10/02/20 1657   Or  . LORazepam (ATIVAN) injection 2 mg  2 mg Intramuscular BID Antonieta Pert, MD   2 mg at 10/03/20 0906  . LORazepam (ATIVAN) injection 2 mg  2 mg Intramuscular Q6H PRN Antonieta Pert, MD   2 mg at 10/01/20 0030  . magnesium hydroxide (MILK OF MAGNESIA) suspension 30 mL  30 mL Oral Daily PRN Rankin, Shuvon B, NP      . paliperidone (INVEGA) 24 hr tablet 6 mg  6 mg Oral QHS Antonieta Pert, MD      . traZODone (DESYREL) tablet 100 mg  100 mg Oral QHS Rankin, Shuvon B, NP   100 mg at 10/01/20 2244    Lab Results: No results found for this or any previous visit (from the past 48 hour(s)).  Blood Alcohol level:  Lab Results  Component Value Date   ETH <10 09/27/2020   ETH <10 09/09/2020    Metabolic Disorder Labs: Lab Results  Component Value Date   HGBA1C 5.6 12/27/2019   MPG 114.02 12/27/2019   MPG 105.41 12/04/2019   Lab Results  Component Value Date   PROLACTIN 57.7 (H) 12/04/2019   PROLACTIN 75.7 (H) 10/23/2019   Lab Results  Component Value Date   CHOL 145 12/27/2019   TRIG 59 12/27/2019   HDL 24 (L) 12/27/2019   CHOLHDL 6.0 12/27/2019   VLDL 12 12/27/2019   LDLCALC 109 (H) 12/27/2019   LDLCALC 100 (H) 12/04/2019    Physical Findings: AIMS: Facial and Oral Movements Muscles of Facial Expression: None, normal Lips and Perioral Area: None, normal Jaw: None, normal Tongue: None, normal,Extremity Movements Upper (arms, wrists, hands, fingers): None, normal Lower (legs, knees, ankles, toes): None,  normal, Trunk Movements Neck, shoulders, hips: None, normal, Overall Severity Severity of abnormal movements (highest score from questions above): None, normal Incapacitation due to abnormal movements: None, normal Patient's awareness of abnormal movements (rate only patient's report): No Awareness, Dental Status Current problems with teeth and/or dentures?: No Does patient usually wear dentures?: No  CIWA:    COWS:     Musculoskeletal: Strength &  Muscle Tone: within normal limits Gait & Station: normal Patient leans: N/A  Psychiatric Specialty Exam: Physical Exam Vitals and nursing note reviewed.  Constitutional:      Appearance: She is obese.  HENT:     Head: Normocephalic and atraumatic.  Pulmonary:     Effort: Pulmonary effort is normal.  Skin:    Findings: No rash.  Neurological:     General: No focal deficit present.     Mental Status: She is alert.     Review of Systems  Blood pressure 139/82, pulse 98, temperature 98.7 F (37.1 C), temperature source Oral, resp. rate 20, last menstrual period 09/13/2020, SpO2 98 %.There is no height or weight on file to calculate BMI.  General Appearance: Disheveled  Eye Contact:  Fair  Speech:  Essentially mute, but did say a few words this morning.  Volume:  Essentially mute but she did say a few words this morning.  Mood:  Dysphoric  Affect:  Flat  Thought Process:  Goal Directed and Descriptions of Associations: Loose  Orientation:  Negative  Thought Content:  Delusions, Paranoid Ideation and Rumination  Suicidal Thoughts:  No  Homicidal Thoughts:  No  Memory:  Immediate;   Poor Recent;   Poor Remote;   Poor  Judgement:  Impaired  Insight:  Lacking  Psychomotor Activity:  Psychomotor Retardation  Concentration:  Concentration: Poor and Attention Span: Poor  Recall:  Poor  Fund of Knowledge:  Poor  Language:  Poor  Akathisia:  Negative  Handed:  Right  AIMS (if indicated):     Assets:  Desire for  Improvement Housing Resilience Social Support  ADL's:  Impaired  Cognition:  Impaired,  Moderate  Sleep:  Number of Hours: 9.75     Treatment Plan Summary: Daily contact with patient to assess and evaluate symptoms and progress in treatment, Medication management and Plan : Patient is seen and examined.  Patient is a 25 year old female with the above-stated past psychiatric history was seen in follow-up.  Diagnosis: 1.  Schizophrenia.  Pertinent findings on examination today: 1.  Her episodes of running for the door and agitation seem to be decreasing.  Otherwise she continues to go from a semicatatonic state to agitation. 2.  Unfortunately there was some confusion with regard to the Prolixin, but we will make that more clear today. 3.  Depending upon her response to the Prolixin we may need to give consideration to the addition of Clozaril.  Plan: 1.  Continue Cogentin 1 mg p.o. or IM twice daily for side effects of medication. 2.  Continue Benadryl 25 mg p.o. or IM every 4-6  hours as needed sleep or agitation. 3.  Continue fluphenazine 10 mg p.o. 3 times daily and if she refuses then 5 mg IM 3 times daily for psychosis.  She is on a forced medication protocol. 4.  Continue lorazepam 2 mg p.o. or IM twice daily for catatonia. 5.  Continue lorazepam 2 mg IM every 6 hours as needed agitation. 6.  Continue paliperidone 6 mg p.o. nightly for psychosis. 7.  Continue trazodone 100 mg p.o. nightly for insomnia. 8.  Reorder urinalysis and use toilet hat to catch urine if at all possible. 9.  If when we maximize her Prolixin treatment there is no improvement consideration of clozapine. 10.  Disposition planning-in progress. Antonieta Pert, MD 10/03/2020, 11:20 AM

## 2020-10-03 NOTE — Progress Notes (Signed)
Pt given PO medications and pt spit them out. Pt given PRN Benadryl IM per MAR , pt accepted medication without incident
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
## 2020-10-03 NOTE — Progress Notes (Signed)
At the start of shift, patient was sitting on the edge of her bed staring out the window for an hour. When asked what she was staring at or thinking about, she did not respond and continued staring. She came out of her room at 9:30pm and took her trazodone and Invega. She denied SI, HI, and AVH. Patient did not  Patient remains safe on the unit at this time. q15 minute safety checks are maintained.

## 2020-10-03 NOTE — Progress Notes (Signed)
D: Patient refused to answer questions and refused to open her eyes. Pt. Isolated in her room. Pt. Was disheveled and had strong body odor.   A:  Patient took scheduled IM  Medicine; prolixin, ativan and cogentin.  Support and encouragement provided Routine safety checks conducted every 15 minutes. Patient  Informed to notify staff with any concerns.    R: Safety maintained.

## 2020-10-04 MED ORDER — FLUPHENAZINE HCL 5 MG PO TABS
15.0000 mg | ORAL_TABLET | Freq: Every day | ORAL | Status: DC
  Administered 2020-10-05: 15 mg via ORAL
  Filled 2020-10-04 (×3): qty 1

## 2020-10-04 MED ORDER — FLUPHENAZINE HCL 2.5 MG/ML IJ SOLN
5.0000 mg | Freq: Two times a day (BID) | INTRAMUSCULAR | Status: DC
  Filled 2020-10-04 (×5): qty 2

## 2020-10-04 MED ORDER — FLUPHENAZINE HCL 10 MG PO TABS
10.0000 mg | ORAL_TABLET | Freq: Two times a day (BID) | ORAL | Status: DC
Start: 2020-10-05 — End: 2020-10-06
  Administered 2020-10-05 – 2020-10-06 (×3): 10 mg via ORAL
  Filled 2020-10-04 (×5): qty 1

## 2020-10-04 MED ORDER — PALIPERIDONE ER 3 MG PO TB24
3.0000 mg | ORAL_TABLET | Freq: Every day | ORAL | Status: DC
  Filled 2020-10-04 (×3): qty 1

## 2020-10-04 MED ORDER — FLUPHENAZINE HCL 5 MG/ML PO CONC
10.0000 mg | Freq: Every evening | ORAL | Status: DC | PRN
Start: 2020-10-04 — End: 2020-10-06
  Filled 2020-10-04: qty 2

## 2020-10-04 NOTE — Progress Notes (Signed)
   10/03/20 2200  Psych Admission Type (Psych Patients Only)  Admission Status Involuntary  Psychosocial Assessment  Patient Complaints Anhedonia  Eye Contact Avertive  Facial Expression Blank  Affect Blunted  Speech Elective mutism  Interaction Minimal;Isolative  Motor Activity Slow  Appearance/Hygiene Body odor;Disheveled;Poor hygiene  Behavior Characteristics Unable to participate  Mood Anhedonia  Thought Process  Coherency WDL  Content UTA (does not speak much,)  Delusions None reported or observed  Perception WDL  Hallucination None reported or observed  Judgment Limited  Confusion UTA  Danger to Self  Current suicidal ideation? Denies  Danger to Others  Danger to Others None reported or observed  Danger to Others Abnormal  Harmful Behavior to others No threats or harm toward other people  Destructive Behavior No threats or harm toward property  Dar Note: Patient appears preoccupied earlier this shift.  Got out of bed and ran towards the double door.  Refused oral medication but given IM injection as prescribed.  Routine safety checks maintained.  Affect and behavior improved this afternoon.  Interacting with staff and provider with clear thought process.  Patient is safe on the unit.

## 2020-10-04 NOTE — Tx Team (Signed)
Interdisciplinary Treatment and Diagnostic Plan Update  10/04/2020 Time of Session: 9:40am Lindsey Richmond MRN: [id number]  Principal Diagnosis: <principal problem not specified>  Secondary Diagnoses: Active Problems:   Schizophrenia (HCC)   Schizophrenia, paranoid type (HCC)   Current Medications:  Current Facility-Administered Medications  Medication Dose Route Frequency Provider Last Rate Last Admin  . acetaminophen (TYLENOL) tablet 650 mg  650 mg Oral Q6H PRN Antonieta Pert, MD      . alum & mag hydroxide-simeth (MAALOX/MYLANTA) 200-200-20 MG/5ML suspension 30 mL  30 mL Oral Q4H PRN Antonieta Pert, MD      . benztropine (COGENTIN) tablet 1 mg  1 mg Oral BID Antonieta Pert, MD   1 mg at 10/02/20 1657   Or  . benztropine mesylate (COGENTIN) injection 1 mg  1 mg Intramuscular BID Antonieta Pert, MD   1 mg at 10/04/20 2993  . diphenhydrAMINE (BENADRYL) capsule 25 mg  25 mg Oral Q4H PRN Antonieta Pert, MD       Or  . diphenhydrAMINE (BENADRYL) injection 25 mg  25 mg Intramuscular Q6H PRN Antonieta Pert, MD   25 mg at 10/04/20 7169  . fluPHENAZine (PROLIXIN) 5 MG/ML solution 10 mg  10 mg Oral QHS PRN Antonieta Pert, MD      . fluPHENAZine (PROLIXIN) injection 5 mg  5 mg Intramuscular Q8H PRN Antonieta Pert, MD   5 mg at 10/03/20 1727  . [START ON 10/05/2020] fluPHENAZine (PROLIXIN) tablet 10 mg  10 mg Oral BID Antonieta Pert, MD       Or  . Melene Muller ON 10/05/2020] fluPHENAZine (PROLIXIN) injection 5 mg  5 mg Intramuscular BID Antonieta Pert, MD      . fluPHENAZine (PROLIXIN) tablet 15 mg  15 mg Oral QHS Antonieta Pert, MD      . LORazepam (ATIVAN) tablet 2 mg  2 mg Oral BID Antonieta Pert, MD   2 mg at 10/02/20 1657   Or  . LORazepam (ATIVAN) injection 2 mg  2 mg Intramuscular BID Antonieta Pert, MD   2 mg at 10/04/20 6789  . LORazepam (ATIVAN) injection 2 mg  2 mg Intramuscular Q6H PRN Antonieta Pert, MD   2 mg at 10/01/20 0030  . magnesium  hydroxide (MILK OF MAGNESIA) suspension 30 mL  30 mL Oral Daily PRN Rankin, Shuvon B, NP      . paliperidone (INVEGA) 24 hr tablet 3 mg  3 mg Oral QHS Antonieta Pert, MD      . traZODone (DESYREL) tablet 100 mg  100 mg Oral QHS Rankin, Shuvon B, NP   100 mg at 10/03/20 2138   PTA Medications: Medications Prior to Admission  Medication Sig Dispense Refill Last Dose  . cloNIDine (CATAPRES) 0.1 MG tablet Take 1 tablet (0.1 mg total) by mouth 2 (two) times daily. 60 tablet 0   . haloperidol (HALDOL) 5 MG tablet Take 1 tablet (5 mg total) by mouth 2 (two) times daily. 60 tablet 0   . LORazepam (ATIVAN) 1 MG tablet Take 1 tablet (1 mg total) by mouth 3 (three) times daily. 90 tablet 0   . paliperidone (INVEGA SUSTENNA) 156 MG/ML SUSY injection Inject 1 mL (156 mg total) into the muscle every 30 (thirty) days. 1.2 mL 1   . paliperidone (INVEGA) 9 MG 24 hr tablet Take 1 tablet (9 mg total) by mouth at bedtime. 30 tablet 0   . traZODone (DESYREL) 100 MG tablet  Take 1 tablet (100 mg total) by mouth at bedtime. 30 tablet 0     Patient Stressors: Medication change or noncompliance  Patient Strengths: Armed forces logistics/support/administrative officer Physical Health Supportive family/friends  Treatment Modalities: Medication Management, Group therapy, Case management,  1 to 1 session with clinician, Psychoeducation, Recreational therapy.   Physician Treatment Plan for Primary Diagnosis: <principal problem not specified> Long Term Goal(s): Improvement in symptoms so as ready for discharge Improvement in symptoms so as ready for discharge   Short Term Goals: Ability to identify changes in lifestyle to reduce recurrence of condition will improve Ability to verbalize feelings will improve Ability to demonstrate self-control will improve Ability to identify and develop effective coping behaviors will improve Ability to maintain clinical measurements within normal limits will improve Compliance with prescribed medications  will improve Ability to identify changes in lifestyle to reduce recurrence of condition will improve Ability to verbalize feelings will improve Ability to demonstrate self-control will improve Ability to identify and develop effective coping behaviors will improve Ability to maintain clinical measurements within normal limits will improve Compliance with prescribed medications will improve  Medication Management: Evaluate patient's response, side effects, and tolerance of medication regimen.  Therapeutic Interventions: 1 to 1 sessions, Unit Group sessions and Medication administration.  Evaluation of Outcomes: Progressing  Physician Treatment Plan for Secondary Diagnosis: Active Problems:   Schizophrenia ([place])   Schizophrenia, paranoid type ([place])  Long Term Goal(s): Improvement in symptoms so as ready for discharge Improvement in symptoms so as ready for discharge   Short Term Goals: Ability to identify changes in lifestyle to reduce recurrence of condition will improve Ability to verbalize feelings will improve Ability to demonstrate self-control will improve Ability to identify and develop effective coping behaviors will improve Ability to maintain clinical measurements within normal limits will improve Compliance with prescribed medications will improve Ability to identify changes in lifestyle to reduce recurrence of condition will improve Ability to verbalize feelings will improve Ability to demonstrate self-control will improve Ability to identify and develop effective coping behaviors will improve Ability to maintain clinical measurements within normal limits will improve Compliance with prescribed medications will improve     Medication Management: Evaluate patient's response, side effects, and tolerance of medication regimen.  Therapeutic Interventions: 1 to 1 sessions, Unit Group sessions and Medication administration.  Evaluation of Outcomes: Progressing   RN  Treatment Plan for Primary Diagnosis: <principal problem not specified> Long Term Goal(s): Knowledge of disease and therapeutic regimen to maintain health will improve  Short Term Goals: Ability to remain free from injury will improve, Ability to verbalize frustration and anger appropriately will improve, Ability to participate in decision making will improve, Ability to verbalize feelings will improve, Ability to identify and develop effective coping behaviors will improve and Compliance with prescribed medications will improve  Medication Management: RN will administer medications as ordered by provider, will assess and evaluate patient's response and provide education to patient for prescribed medication. RN will report any adverse and/or side effects to prescribing provider.  Therapeutic Interventions: 1 on 1 counseling sessions, Psychoeducation, Medication administration, Evaluate responses to treatment, Monitor vital signs and CBGs as ordered, Perform/monitor CIWA, COWS, AIMS and Fall Risk screenings as ordered, Perform wound care treatments as ordered.  Evaluation of Outcomes: Progressing   LCSW Treatment Plan for Primary Diagnosis: <principal problem not specified> Long Term Goal(s): Safe transition to appropriate next level of care at discharge, Engage patient in therapeutic group addressing interpersonal concerns.  Short Term Goals: Engage patient in aftercare  planning with referrals and resources, Increase ability to appropriately verbalize feelings, Facilitate acceptance of mental health diagnosis and concerns, Identify triggers associated with mental health/substance abuse issues and Increase skills for wellness and recovery  Therapeutic Interventions: Assess for all discharge needs, 1 to 1 time with Social worker, Explore available resources and support systems, Assess for adequacy in community support network, Educate family and significant other(s) on suicide prevention, Complete  Psychosocial Assessment, Interpersonal group therapy.  Evaluation of Outcomes: Progressing   Progress in Treatment: Attending groups: No. Participating in groups: No. Taking medication as prescribed: Yes. Toleration medication: Yes. Family/Significant other contact made: Yes, individual(s) contacted:  Mother  Patient understands diagnosis: Yes. Discussing patient identified problems/goals with staff: No. Medical problems stabilized or resolved: Yes. Denies suicidal/homicidal ideation: Yes. Issues/concerns per patient self-inventory: No.   New problem(s) identified: Yes, Describe:  patient currently catotanic   New Short Term/Long Term Goal(s): medication stabilization, elimination of SI thoughts, development of comprehensive mental wellness plan.   Patient Goals:  Patient unable to attend.  Discharge Plan or Barriers: Patient recently admitted. CSW will continue to follow and assess for appropriate referrals and possible discharge planning.   Reason for Continuation of Hospitalization: Delusions  Hallucinations Medication stabilization Other; describe Catatonia   Estimated Length of Stay: 3-5 days  Attendees: Patient: Did not attend 10/04/2020  Physician: Landry Mellow, MD 10/04/2020   Nursing:  10/04/2020   RN Care Manager: 10/04/2020   Social Worker: Melba Coon, LCSWA 10/04/2020   Recreational Therapist:  10/04/2020   Other:  10/04/2020   Other:  10/04/2020   Other: 10/04/2020     Scribe for Treatment Team: Aram Beecham, LCSWA 10/04/2020 2:08 PM

## 2020-10-04 NOTE — Progress Notes (Signed)
Adult Psychoeducational Group Note  Date:  10/04/2020 Time:  9:56 PM  Group Topic/Focus:  Wrap-Up Group:   The focus of this group is to help patients review their daily goal of treatment and discuss progress on daily workbooks.  Participation Level:  Did Not Attend  Participation Quality:  Did Not Attend  Affect:  Did Not Attend  Cognitive:  Did Not Attend  Insight: None  Engagement in Group:  Did Not Attend  Modes of Intervention:  Did Not Attend  Additional Comments:  Pt did not attend wrap up group tonight  Felipa Furnace 10/04/2020, 9:56 PM

## 2020-10-04 NOTE — Progress Notes (Signed)
Recreation Therapy Notes  Date: 1.3.22 Time: 1000 Location: [address] Dayroom  Group Topic: Coping Skills  Goal Area(s) Addresses:  Patient will be able to identify positive coping skills. Patient will be able to identify benefits of using coping skills.  Intervention: Herbalist, pencils  Activity: Orthoptist.  Patients were to identify the things they are dealing with and write it inside of the web.  Patients would then write their coping skills on the outside of the web they can use to help deal with the things they are dealing with.  Education: Pharmacologist, Building control surveyor.   Education Outcome: Acknowledges understanding/In group clarification offered/Needs additional education.   Clinical Observations/Feedback: Pt did not attend group session.    Caroll Rancher, LRT/CTRS         Caroll Rancher A 10/04/2020 11:49 AM

## 2020-10-04 NOTE — Progress Notes (Signed)
Adult Psychoeducational Group Note  Date:  10/04/2020 Time:  3:31 AM  Group Topic/Focus:  Wrap-Up Group:   The focus of this group is to help patients review their daily goal of treatment and discuss progress on daily workbooks.  Participation Level:  Did Not Attend  Participation Quality:  Did Not Attend  Affect:  Did Not Attend  Cognitive:  Did Not Attend  Insight: None  Engagement in Group:  Did Not Attend  Modes of Intervention:  Did Not Attend  Additional Comments:  Pt did not attend evening wrap up group tonight.Felipa Furnace 10/04/2020, 3:31 AM

## 2020-10-04 NOTE — Progress Notes (Signed)
[facility] MD Progress Note  10/04/2020 12:40 PM Lindsey Richmond  MRN:  [id number] Subjective:  Patient is a 25 year old female with a longstanding past psychiatric history significant for schizophrenia who was once again noncompliant with her medications and admitted on 09/29/2020 secondary to psychosis.  Objective: Patient is seen and examined.  Patient is a 25 year old female with the above-stated past psychiatric history who is seen in follow-up.  Initially today she is lying in bed, she does open her eyes, but does not speak.  Probably 30 to 60 minutes later I see her walking down the hallway staring out the window to the snow.  Her vital signs are stable, she is afebrile.  Her pulse was 99 this morning.  Nursing notes reflect she slept 5.25 hours last night.  Review of the nursing notes show that last p.m. she was sitting on the edge of her bed staring out the window for approximately an hour.  She did come out of her room at approximately 9:30 PM and took the trazodone and oral Invega.  She denied suicidal or homicidal ideation as well as auditory and visual hallucinations.  Earlier at approximately 1 PM yesterday she refused to answer questions.  She was noted to have poor hygiene.  She did receive the intermuscular dosage of medicine which included Prolixin, Ativan and Cogentin.  No new laboratories.  Principal Problem: <principal problem not specified> Diagnosis: Active Problems:   Schizophrenia (HCC)   Schizophrenia, paranoid type ([place])  Total Time spent with patient: 20 minutes  Past Psychiatric History: See admission H&P  Past Medical History:  Past Medical History:  Diagnosis Date  . Anxiety   . Depression   . Oppositional defiant behavior   . Schizo affective schizophrenia ([place])   . Schizophrenia ([place])    History reviewed. No pertinent surgical history. Family History:  Family History  Problem Relation Age of Onset  . Heart failure Mother   . Hypertension Mother   . Diabetes Father     Family Psychiatric  History: See admission H&P Social History:  Social History   Substance and Sexual Activity  Alcohol Use No     Social History   Substance and Sexual Activity  Drug Use No    Social History   Socioeconomic History  . Marital status: Single    Spouse name: Not on file  . Number of children: Not on file  . Years of education: Not on file  . Highest education level: Not on file  Occupational History  . Occupation: Unemployed  Tobacco Use  . Smoking status: Never Smoker  . Smokeless tobacco: Never Used  Vaping Use  . Vaping Use: Never used  Substance and Sexual Activity  . Alcohol use: No  . Drug use: No  . Sexual activity: Never    Birth control/protection: None  Other Topics Concern  . Not on file  Social History Narrative   ** Merged History Encounter **       Pt lives with her mother, brother, and father.  She receives outpatient [facility] care through [facility].   Social Determinants of Health   Financial Resource Strain: Not on file  Food Insecurity: Not on file  Transportation Needs: Not on file  Physical Activity: Not on file  Stress: Not on file  Social Connections: Not on file   Additional Social History:                         Sleep: Fair  Appetite:  Fair  Current Medications: Current Facility-Administered Medications  Medication Dose Route Frequency Provider Last Rate Last Admin  . acetaminophen (TYLENOL) tablet 650 mg  650 mg Oral Q6H PRN Antonieta Pert, MD      . alum & mag hydroxide-simeth (MAALOX/MYLANTA) 200-200-20 MG/5ML suspension 30 mL  30 mL Oral Q4H PRN Antonieta Pert, MD      . benztropine (COGENTIN) tablet 1 mg  1 mg Oral BID Antonieta Pert, MD   1 mg at 10/02/20 1657   Or  . benztropine mesylate (COGENTIN) injection 1 mg  1 mg Intramuscular BID Antonieta Pert, MD   1 mg at 10/04/20 2841  . diphenhydrAMINE (BENADRYL) capsule 25 mg  25 mg Oral Q4H PRN Antonieta Pert, MD       Or   . diphenhydrAMINE (BENADRYL) injection 25 mg  25 mg Intramuscular Q6H PRN Antonieta Pert, MD   25 mg at 10/04/20 3244  . fluPHENAZine (PROLIXIN) injection 5 mg  5 mg Intramuscular Q8H PRN Antonieta Pert, MD   5 mg at 10/03/20 1727  . fluPHENAZine (PROLIXIN) tablet 10 mg  10 mg Oral TID Antonieta Pert, MD       Or  . fluPHENAZine (PROLIXIN) injection 5 mg  5 mg Intramuscular TID Antonieta Pert, MD   5 mg at 10/04/20 0920  . LORazepam (ATIVAN) tablet 2 mg  2 mg Oral BID Antonieta Pert, MD   2 mg at 10/02/20 1657   Or  . LORazepam (ATIVAN) injection 2 mg  2 mg Intramuscular BID Antonieta Pert, MD   2 mg at 10/04/20 0102  . LORazepam (ATIVAN) injection 2 mg  2 mg Intramuscular Q6H PRN Antonieta Pert, MD   2 mg at 10/01/20 0030  . magnesium hydroxide (MILK OF MAGNESIA) suspension 30 mL  30 mL Oral Daily PRN Rankin, Shuvon B, NP      . paliperidone (INVEGA) 24 hr tablet 6 mg  6 mg Oral QHS Antonieta Pert, MD   6 mg at 10/03/20 2138  . traZODone (DESYREL) tablet 100 mg  100 mg Oral QHS Rankin, Shuvon B, NP   100 mg at 10/03/20 2138    Lab Results: No results found for this or any previous visit (from the past 48 hour(s)).  Blood Alcohol level:  Lab Results  Component Value Date   ETH <10 09/27/2020   ETH <10 09/09/2020    Metabolic Disorder Labs: Lab Results  Component Value Date   HGBA1C 5.6 12/27/2019   MPG 114.02 12/27/2019   MPG 105.41 12/04/2019   Lab Results  Component Value Date   PROLACTIN 57.7 (H) 12/04/2019   PROLACTIN 75.7 (H) 10/23/2019   Lab Results  Component Value Date   CHOL 145 12/27/2019   TRIG 59 12/27/2019   HDL 24 (L) 12/27/2019   CHOLHDL 6.0 12/27/2019   VLDL 12 12/27/2019   LDLCALC 109 (H) 12/27/2019   LDLCALC 100 (H) 12/04/2019    Physical Findings: AIMS: Facial and Oral Movements Muscles of Facial Expression: None, normal Lips and Perioral Area: None, normal Jaw: None, normal Tongue: None, normal,Extremity  Movements Upper (arms, wrists, hands, fingers): None, normal Lower (legs, knees, ankles, toes): None, normal, Trunk Movements Neck, shoulders, hips: None, normal, Overall Severity Severity of abnormal movements (highest score from questions above): None, normal Incapacitation due to abnormal movements: None, normal Patient's awareness of abnormal movements (rate only patient's report): No Awareness, Dental Status Current problems with  teeth and/or dentures?: No Does patient usually wear dentures?: No  CIWA:    COWS:     Musculoskeletal: Strength & Muscle Tone: within normal limits Gait & Station: normal Patient leans: N/A  Psychiatric Specialty Exam: Physical Exam Vitals and nursing note reviewed.  Constitutional:      Appearance: She is obese.  HENT:     Head: Normocephalic and atraumatic.  Pulmonary:     Effort: Pulmonary effort is normal.  Neurological:     General: No focal deficit present.     Mental Status: She is alert.     Review of Systems  Blood pressure 120/68, pulse 99, temperature 98.3 F (36.8 C), temperature source Oral, resp. rate 18, last menstrual period 09/13/2020, SpO2 100 %.There is no height or weight on file to calculate BMI.  General Appearance: Disheveled  Eye Contact:  Minimal  Speech:  Essentially mute  Volume:  Essentially mute  Mood:  Dysphoric  Affect:  Flat  Thought Process:  Disorganized and Descriptions of Associations: Circumstantial  Orientation:  Negative  Thought Content:  Delusions, Paranoid Ideation and Rumination  Suicidal Thoughts:  No  Homicidal Thoughts:  No  Memory:  Immediate;   Poor Recent;   Poor Remote;   Poor  Judgement:  Impaired  Insight:  Lacking  Psychomotor Activity:  Psychomotor Retardation  Concentration:  Concentration: Poor and Attention Span: Poor  Recall:  Poor  Fund of Knowledge:  Poor  Language:  Poor  Akathisia:  Negative  Handed:  Right  AIMS (if indicated):     Assets:  Desire for  Improvement Resilience  ADL's:  Impaired  Cognition:  Impaired,  Mild  Sleep:  Number of Hours: 5.25     Treatment Plan Summary: Daily contact with patient to assess and evaluate symptoms and progress in treatment, Medication management and Plan : Patient is seen and examined.  Patient is a 25 year old female with the above-stated past psychiatric history who is seen in follow-up.   Diagnosis: 1.  Schizophrenia  Pertinent findings on examination today: 1.  She continues to have intermittent periods of semicatatonia, but her episodes of agitation have decreased. 2.  She continues to be essentially mute at times.  Plan: 1.  Continue Cogentin 1 mg p.o. or IM twice daily for side effects of medication. 2.  Change fluphenazine to 10 mg p.o. or IM twice daily and 15 mg p.o. nightly p.o. or IM.  This is for psychosis. 3.  Continue lorazepam 2 mg p.o. or IM twice daily for catatonia. 4.  Add Depakote DR 500 mg p.o. every afternoon for mood stability.  Patient had previously been on Depakote 3 years ago and apparently did better.  On last admission patient was on Tegretol, but was not effective. 5.  Decrease paliperidone oral to 3 mg p.o. nightly for psychosis and mood stability. 6.  Continue trazodone 100 mg p.o. nightly as needed insomnia. 7.  Attempt to get urinalysis again. 8.  Attempt to get EKG. 9.  Disposition planning-in progress. 9.  Antonieta Pert, MD 10/04/2020, 12:40 PM

## 2020-10-04 NOTE — Progress Notes (Incomplete)
[facility] MD Progress Note  10/04/2020 4:17 PM Lindsey Richmond  MRN:  [id number]   Subjective: Lindsey Richmond is a 25 y.o. female with a history of schizophrenia, who was initially admitted for inpatient psychiatric hospitalization on 09/28/2020 for management of acute psychosis with catatonia in the context of medication noncompliance. The patient is currently on Hospital Day 6.   Chart Review from last 24 hours:  The patient's chart was reviewed and nursing notes were reviewed. The patient's case was discussed in multidisciplinary team meeting. Per [facility]   Information Obtained Today During Patient Interview: The patient was seen and evaluated on the unit. On assessment today the patient reports ***   Principal Problem: Schizophrenia ([place]) Diagnosis: Principal Problem:   Schizophrenia ([place])  Total Time Spent in Direct Patient Care:  I personally spent *** minutes on the unit in direct patient care. The direct patient care time included face-to-face time with the patient, reviewing the patient's chart, communicating with other professionals, and coordinating care. Greater than 50% of this time was spent in counseling or coordinating care with the patient regarding goals of hospitalization, psycho-education, and discharge planning needs.  Past Psychiatric History: (per admission H&P) Patient has had multiple psychiatric hospitalizations at our facility.  Her last was on 09/13/2020 for similar circumstances.  She has been treated with multiple medications in the past.  She is followed by a ACT team, and most recently has been on long-acting paliperidone, Risperdal, Cogentin, trazodone and apparently Provigil.  Past Medical History:  Past Medical History:  Diagnosis Date  . Anxiety   . Depression   . Oppositional defiant behavior   . Schizo affective schizophrenia ([place])   . Schizophrenia ([place])    History reviewed. No pertinent surgical history. Family History:  Family History  Problem Relation Age of  Onset  . Heart failure Mother   . Hypertension Mother   . Diabetes Father    Family Psychiatric  History: (per admission H&P) Reportedly negative  Social History:  Social History   Substance and Sexual Activity  Alcohol Use No     Social History   Substance and Sexual Activity  Drug Use No    Sleep: per patient report  Appetite:  per patient report  Current Medications: Current Facility-Administered Medications  Medication Dose Route Frequency Provider Last Rate Last Admin  . acetaminophen (TYLENOL) tablet 650 mg  650 mg Oral Q6H PRN Sharma Covert, MD      . alum & mag hydroxide-simeth (MAALOX/MYLANTA) 200-200-20 MG/5ML suspension 30 mL  30 mL Oral Q4H PRN Sharma Covert, MD      . benztropine (COGENTIN) tablet 1 mg  1 mg Oral BID Sharma Covert, MD   1 mg at 10/02/20 1657   Or  . benztropine mesylate (COGENTIN) injection 1 mg  1 mg Intramuscular BID Sharma Covert, MD   1 mg at 10/04/20 2778  . diphenhydrAMINE (BENADRYL) capsule 25 mg  25 mg Oral Q4H PRN Sharma Covert, MD       Or  . diphenhydrAMINE (BENADRYL) injection 25 mg  25 mg Intramuscular Q6H PRN Sharma Covert, MD   25 mg at 10/04/20 2423  . fluPHENAZine (PROLIXIN) 5 MG/ML solution 10 mg  10 mg Oral QHS PRN Sharma Covert, MD      . fluPHENAZine (PROLIXIN) injection 5 mg  5 mg Intramuscular Q8H PRN Sharma Covert, MD   5 mg at 10/03/20 1727  . [START ON 10/05/2020] fluPHENAZine (PROLIXIN) tablet 10 mg  10 mg Oral BID Antonieta Pert, MD       Or  . Melene Muller ON 10/05/2020] fluPHENAZine (PROLIXIN) injection 5 mg  5 mg Intramuscular BID Antonieta Pert, MD      . fluPHENAZine (PROLIXIN) tablet 15 mg  15 mg Oral QHS Antonieta Pert, MD      . LORazepam (ATIVAN) tablet 2 mg  2 mg Oral BID Antonieta Pert, MD   2 mg at 10/02/20 1657   Or  . LORazepam (ATIVAN) injection 2 mg  2 mg Intramuscular BID Antonieta Pert, MD   2 mg at 10/04/20 3382  . LORazepam (ATIVAN) injection 2 mg   2 mg Intramuscular Q6H PRN Antonieta Pert, MD   2 mg at 10/01/20 0030  . magnesium hydroxide (MILK OF MAGNESIA) suspension 30 mL  30 mL Oral Daily PRN Rankin, Shuvon B, NP      . paliperidone (INVEGA) 24 hr tablet 3 mg  3 mg Oral QHS Antonieta Pert, MD      . traZODone (DESYREL) tablet 100 mg  100 mg Oral QHS Rankin, Shuvon B, NP   100 mg at 10/03/20 2138    Lab Results: No results found for this or any previous visit (from the past 48 hour(s)).  Blood Alcohol level:  Lab Results  Component Value Date   ETH <10 09/27/2020   ETH <10 09/09/2020    Metabolic Disorder Labs: Lab Results  Component Value Date   HGBA1C 5.6 12/27/2019   MPG 114.02 12/27/2019   MPG 105.41 12/04/2019   Lab Results  Component Value Date   PROLACTIN 57.7 (H) 12/04/2019   PROLACTIN 75.7 (H) 10/23/2019   Lab Results  Component Value Date   CHOL 145 12/27/2019   TRIG 59 12/27/2019   HDL 24 (L) 12/27/2019   CHOLHDL 6.0 12/27/2019   VLDL 12 12/27/2019   LDLCALC 109 (H) 12/27/2019   LDLCALC 100 (H) 12/04/2019    Physical Findings: AIMS: Facial and Oral Movements Muscles of Facial Expression: None, normal Lips and Perioral Area: None, normal Jaw: None, normal Tongue: None, normal,Extremity Movements Upper (arms, wrists, hands, fingers): None, normal Lower (legs, knees, ankles, toes): None, normal, Trunk Movements Neck, shoulders, hips: None, normal, Overall Severity Severity of abnormal movements (highest score from questions above): None, normal Incapacitation due to abnormal movements: None, normal Patient's awareness of abnormal movements (rate only patient's report): No Awareness, Dental Status Current problems with teeth and/or dentures?: No Does patient usually wear dentures?: No   Musculoskeletal: Strength & Muscle Tone: {desc; muscle tone:32375} Gait & Station: {PE GAIT ED NKNL:97673} Patient leans: {Patient Leans:21022755}  Psychiatric Specialty Exam: Physical Exam  Review of  Systems  Blood pressure 120/68, pulse 99, temperature 98.3 F (36.8 C), temperature source Oral, resp. rate 18, last menstrual period 09/13/2020, SpO2 100 %.There is no height or weight on file to calculate BMI.  General Appearance: {Appearance:22683}  Eye Contact:  {BHH EYE CONTACT:22684}  Speech:  {Speech:22685}  Volume:  {Volume (PAA):22686}  Mood:  {BHH MOOD:22306}  Affect:  {Affect (PAA):22687}  Thought Process:  {Thought Process (PAA):22688}  Orientation:  {BHH ORIENTATION (PAA):22689}  Thought Content:  {Thought Content:22690}  Suicidal Thoughts:  {ST/HT (PAA):22692}  Homicidal Thoughts:  {ST/HT (PAA):22692}  Memory:  {BHH MEMORY:22881}  Judgement:  {Judgement (PAA):22694}  Insight:  {Insight (PAA):22695}  Psychomotor Activity:  {Psychomotor (PAA):22696}  Concentration:  {Concentration:21399}  Recall:  {BHH GOOD/FAIR/POOR:22877}  Fund of Knowledge:  {BHH GOOD/FAIR/POOR:22877}  Language:  {BHH GOOD/FAIR/POOR:22877}  Akathisia:  {BHH YES OR NO:22294}  Handed:  {Handed:22697}  AIMS (if indicated):     Assets:  {Assets (PAA):22698}  ADL's:  {BHH DTO'I:71245}  Cognition:  {chl bhh cognition:304700322}  Sleep:  Number of Hours: 5.25   ASSESSMENT: Lindsey Richmond is a 25 y.o. female with a history of schizophrenia, who was initially admitted for inpatient psychiatric hospitalization on 09/28/2020 for management of acute psychosis with catatonia in the context of medication noncompliance. Pertinent findings on examination today include:    Diagnoses / Active Problems: Schizophrenia  TREATMENT PLAN SUMMARY: 1. Safety and Monitoring:  -- Involuntary admission to inpatient psychiatric unit for safety, stabilization and treatment  -- Daily contact with patient to assess and evaluate symptoms and progress in treatment  -- Patient's case to be discussed in multi-disciplinary team meeting  -- Observation Level : q15 minute checks  -- Vital signs:  q12 hours  -- Precautions:  suicide  2. Psychiatric Diagnoses and Treatment:   Schizophrenia -- Continue Cogentin 1mg  po or IM bid for side-effects of medication with ongoing AIMS monitoring -- Continue Prolixin 10mg  po or IM bid and 15mg  po or IM qhs for psychosis  -- Continue Invega 3mg  po qhs  -- Continue Ativan 2mg  po or IM bid for catatonia -- Continue VPA 500mg  every afternoon for augmentation of antipsychotic and for mood stability -- Continue Trazodone 100mg  po qhs PRN for insomnia  -- Metabolic profile and EKG monitoring while on antipsychotics (Lipid Panel: pending; HbgA1c: QTc: with repeat EKG pending)   -- CBC monitoring while on multiple antipsychotics   -- Short Term Goals: Ability to demonstrate self-control will improve and Compliance with prescribed medications will improve  -- Long Term Goals: Improvement in symptoms so as ready for discharge  Justification for Use of More than 1 Antipsychotic: Patient has tried and failed multiple antipsychotic trials without sufficient improvement in symptoms or functioning and was admitted on more than 1 antipsychotic. Previous antipsychotic trials include: Invega sustenna and Invega oral, Abilify Maintena, Risperdal oral, Zyprexa oral, and Haldol oral.   3. Medical Issues Being Addressed:    4. Discharge Planning:   -- Social work and case management to assist with discharge planning and identification of hospital follow-up needs prior to discharge  -- Estimated LOS: TBD  -- Discharge Concerns: Need to establish a safety plan; Medication compliance and effectiveness  -- Discharge Goals: Return home with outpatient referrals for mental health follow-up including medication management/psychotherapy  , MD, FAPA 10/04/2020, 4:17 PM

## 2020-10-04 NOTE — Progress Notes (Signed)
   10/04/20 2200  Psych Admission Type (Psych Patients Only)  Admission Status Involuntary  Psychosocial Assessment  Patient Complaints Anxiety  Eye Contact Avertive  Facial Expression Blank  Affect Blunted  Speech Elective mutism  Interaction Minimal;Isolative  Motor Activity Slow  Appearance/Hygiene Body odor;Disheveled;Poor hygiene  Behavior Characteristics Elopement risk  Mood Preoccupied  Thought Process  Coherency WDL  Content UTA  Delusions None reported or observed  Perception Derealization  Hallucination None reported or observed  Judgment Limited  Confusion None  Danger to Self  Current suicidal ideation? Denies  Danger to Others  Danger to Others None reported or observed  Danger to Others Abnormal  Harmful Behavior to others No threats or harm toward other people  Destructive Behavior No threats or harm toward property

## 2020-10-04 NOTE — [facility] Group Notes (Signed)
[facility] LCSW Group Therapy  10/04/2020 1:08 PM  Type of Therapy:  Group Therapy  Participation Level:  Did Not Attend   [place] Vuncannon M Deanette Tullius 10/04/2020, 1:08 PM  

## 2020-10-05 NOTE — Progress Notes (Signed)
Pt took medications without incident this shift.  Pt's affect was much more bright today and pt was smiling on and off during the shift.  Pt said she was hopeful that she would be discharged in the next couple of days.  Pt taken off unit restriction per Dr. Mason Jim and we will see how pt does going to and from the cafeteria.  Pt did fine with lunch and dinner today.  RN will continue to monitor and provide support as needed.

## 2020-10-05 NOTE — Progress Notes (Signed)
   10/05/20 2300  Psych Admission Type (Psych Patients Only)  Admission Status Involuntary  Psychosocial Assessment  Patient Complaints None  Eye Contact Avertive  Facial Expression Blank  Affect Blunted  Speech Elective mutism  Interaction Minimal;Isolative  Motor Activity Slow  Appearance/Hygiene Body odor;Disheveled;Poor hygiene  Behavior Characteristics Cooperative  Mood Preoccupied  Thought Process  Coherency WDL  Content Magical thinking  Delusions None reported or observed  Perception Derealization  Hallucination None reported or observed  Judgment Limited  Confusion None  Danger to Self  Current suicidal ideation? Denies  Danger to Others  Danger to Others None reported or observed  Danger to Others Abnormal  Harmful Behavior to others No threats or harm toward other people  Destructive Behavior No threats or harm toward property

## 2020-10-05 NOTE — Progress Notes (Signed)
[facility] MD Progress Note  10/05/2020 12:45 PM Lindsey Richmond  MRN:  [id number] Subjective: Patient is a 25 year old female with a longstanding history significant for schizophrenia, paranoid type was again admitted due to noncompliance of her medications.    Patient this morning, reports that she is doing much better, is able to have a conversation, reports that she is not suicidal, homicidal, denies any hallucinations or paranoia.  Patient also reports that she slept well last night, knows she needs to stay on her medications in order for her to do well.  Patient also reported that she has been attending groups, is doing her ADLs.  Patient reports that she is not having any side effects with the medications, feels that she is close to her baseline though she does report some confusion at times. Principal Problem: Schizophrenia, paranoid (HCC) Diagnosis: Principal Problem:   Schizophrenia, paranoid (HCC)  Total Time spent with patient: 15 minutes  Past Psychiatric History:   Past Medical History:  Past Medical History:  Diagnosis Date  . Anxiety   . Depression   . Oppositional defiant behavior   . Schizo affective schizophrenia (HCC)   . Schizophrenia (HCC)    History reviewed. No pertinent surgical history. Family History:  Family History  Problem Relation Age of Onset  . Heart failure Mother   . Hypertension Mother   . Diabetes Father    Family Psychiatric  History: unchanged from admission Social History:  Social History   Substance and Sexual Activity  Alcohol Use No     Social History   Substance and Sexual Activity  Drug Use No    Social History   Socioeconomic History  . Marital status: Single    Spouse name: Not on file  . Number of children: Not on file  . Years of education: Not on file  . Highest education level: Not on file  Occupational History  . Occupation: Unemployed  Tobacco Use  . Smoking status: Never Smoker  . Smokeless tobacco: Never Used  Vaping  Use  . Vaping Use: Never used  Substance and Sexual Activity  . Alcohol use: No  . Drug use: No  . Sexual activity: Never    Birth control/protection: None  Other Topics Concern  . Not on file  Social History Narrative   ** Merged History Encounter **       Pt lives with her mother, brother, and father.  She receives outpatient psychiatric care through [facility].   Social Determinants of Health   Financial Resource Strain: Not on file  Food Insecurity: Not on file  Transportation Needs: Not on file  Physical Activity: Not on file  Stress: Not on file  Social Connections: Not on file   Additional Social History:                         Sleep: Fair  Appetite:  Fair  Current Medications: Current Facility-Administered Medications  Medication Dose Route Frequency Provider Last Rate Last Admin  . acetaminophen (TYLENOL) tablet 650 mg  650 mg Oral Q6H PRN Antonieta Pert, MD      . alum & mag hydroxide-simeth (MAALOX/MYLANTA) 200-200-20 MG/5ML suspension 30 mL  30 mL Oral Q4H PRN Antonieta Pert, MD      . benztropine (COGENTIN) tablet 1 mg  1 mg Oral BID Antonieta Pert, MD   1 mg at 10/05/20 0737   Or  . benztropine mesylate (COGENTIN) injection 1 mg  1 mg  Intramuscular BID Antonieta Pert, MD   1 mg at 10/04/20 6301  . diphenhydrAMINE (BENADRYL) capsule 25 mg  25 mg Oral Q4H PRN Antonieta Pert, MD       Or  . diphenhydrAMINE (BENADRYL) injection 25 mg  25 mg Intramuscular Q6H PRN Antonieta Pert, MD   25 mg at 10/04/20 6010  . fluPHENAZine (PROLIXIN) 5 MG/ML solution 10 mg  10 mg Oral QHS PRN Antonieta Pert, MD      . fluPHENAZine (PROLIXIN) injection 5 mg  5 mg Intramuscular Q8H PRN Antonieta Pert, MD   5 mg at 10/03/20 1727  . fluPHENAZine (PROLIXIN) tablet 10 mg  10 mg Oral BID Antonieta Pert, MD   10 mg at 10/05/20 9323   Or  . fluPHENAZine (PROLIXIN) injection 5 mg  5 mg Intramuscular BID Antonieta Pert, MD      . fluPHENAZine  (PROLIXIN) tablet 15 mg  15 mg Oral QHS Antonieta Pert, MD      . LORazepam (ATIVAN) tablet 2 mg  2 mg Oral BID Antonieta Pert, MD   2 mg at 10/05/20 5573   Or  . LORazepam (ATIVAN) injection 2 mg  2 mg Intramuscular BID Antonieta Pert, MD   2 mg at 10/04/20 2202  . LORazepam (ATIVAN) injection 2 mg  2 mg Intramuscular Q6H PRN Antonieta Pert, MD   2 mg at 10/01/20 0030  . magnesium hydroxide (MILK OF MAGNESIA) suspension 30 mL  30 mL Oral Daily PRN Rankin, Shuvon B, NP      . paliperidone (INVEGA) 24 hr tablet 3 mg  3 mg Oral QHS Antonieta Pert, MD      . traZODone (DESYREL) tablet 100 mg  100 mg Oral QHS Rankin, Shuvon B, NP   100 mg at 10/03/20 2138    Lab Results: No results found for this or any previous visit (from the past 48 hour(s)).  Blood Alcohol level:  Lab Results  Component Value Date   ETH <10 09/27/2020   ETH <10 09/09/2020    Metabolic Disorder Labs: Lab Results  Component Value Date   HGBA1C 5.6 12/27/2019   MPG 114.02 12/27/2019   MPG 105.41 12/04/2019   Lab Results  Component Value Date   PROLACTIN 57.7 (H) 12/04/2019   PROLACTIN 75.7 (H) 10/23/2019   Lab Results  Component Value Date   CHOL 145 12/27/2019   TRIG 59 12/27/2019   HDL 24 (L) 12/27/2019   CHOLHDL 6.0 12/27/2019   VLDL 12 12/27/2019   LDLCALC 109 (H) 12/27/2019   LDLCALC 100 (H) 12/04/2019    Physical Findings: AIMS: Facial and Oral Movements Muscles of Facial Expression: None, normal Lips and Perioral Area: None, normal Jaw: None, normal Tongue: None, normal,Extremity Movements Upper (arms, wrists, hands, fingers): None, normal Lower (legs, knees, ankles, toes): None, normal, Trunk Movements Neck, shoulders, hips: None, normal, Overall Severity Severity of abnormal movements (highest score from questions above): None, normal Incapacitation due to abnormal movements: None, normal Patient's awareness of abnormal movements (rate only patient's report): No  Awareness, Dental Status Current problems with teeth and/or dentures?: No Does patient usually wear dentures?: No  CIWA:    COWS:     Musculoskeletal: Strength & Muscle Tone: within normal limits Gait & Station: normal Patient leans: N/A  Psychiatric Specialty Exam: Physical Exam Psychiatric:        Mood and Affect: Mood normal.        Judgment: Judgment  normal.     Review of Systems  Constitutional: Negative.  Negative for activity change and fatigue.  HENT: Negative.  Negative for congestion and sore throat.   Eyes: Negative.  Negative for discharge, redness and visual disturbance.  Respiratory: Negative.  Negative for cough, shortness of breath and wheezing.   Cardiovascular: Negative.  Negative for palpitations.  Gastrointestinal: Negative.  Negative for diarrhea, nausea and vomiting.  Musculoskeletal: Negative.   Neurological: Negative for dizziness, seizures and numbness.  Psychiatric/Behavioral: Positive for confusion. Negative for agitation, behavioral problems, decreased concentration, dysphoric mood, hallucinations, sleep disturbance and suicidal ideas. The patient is not nervous/anxious.     Blood pressure 120/68, pulse 99, temperature 98.3 F (36.8 C), temperature source Oral, resp. rate 18, last menstrual period 09/13/2020, SpO2 100 %.There is no height or weight on file to calculate BMI.  General Appearance: Casual  Eye Contact:  Good in  Speech:  Clear and Coherent  Volume:  Normal  Mood:  Depressed and Dysphoric  Affect:  Congruent  Thought Process:  Coherent  Orientation:  Full (Time, Place, and Person)  Thought Content:  Hallucinations: None  Suicidal Thoughts:  No  Homicidal Thoughts:  No  Memory:  Immediate;   Fair Recent;   Fair  Judgement:  Fair  Insight:  Fair  Psychomotor Activity:  Normal  Concentration:  Concentration: Fair  Recall:  [employer] of Knowledge:  Fair  Language:  Fair  Akathisia:  No  Handed:  Right  AIMS (if indicated):      Assets:  Communication Skills Desire for Improvement Resilience Social Support  ADL's:  Intact  Cognition:  WNL  Sleep:  Number of Hours: 10     Treatment Plan Summary: Daily contact with patient to assess and evaluate symptoms and progress in treatment and Medication management  Invega 3 mg at bedtime was discontinued starting tonight.  Patient has already gotten her [place].  This is for psychosis To continue Cogentin 1 mg twice daily To continue fluphenazine 10 mg twice daily and 15 mg at night.  The medication is for psychosis To continue trazodone 100 mg at night as needed for sleep Discussed with patient in length to contact mom and see if she is close to baseline also addressed with patient her history of noncompliance, the need to stay on medication in order for her to do well.    Derrill Center, NP 10/05/2020, 12:45 PM

## 2020-10-05 NOTE — Progress Notes (Signed)
Recreation Therapy Notes  Date: 1.4.22 Time: 1000 Location: 500 Hall Dayroom  Group Topic: Anxiety  Goal Area(s) Addresses:  Patient will identify triggers for anxiety.  Patient will identify a situation that makes them anxious.  Patient will identify what other emotions come with anxiety.   Behavioral Response: Engaged  Intervention: Conversation with group, and worksheets  Activity: Patient discussed anxiety.  Patients discussed what makes them anxious, physical symptoms of anxiety, the thoughts they have when anxious and coping skills they use when anxious.  Education: Anxiety Management, Discharge Planning   Education Outcome: Acknowledges education/In group clarification offered/Needs additional education.   Clinical Observations/Feedback: Pt was attentive and appropriate during group session.  Pt identified triggers as unpredictable and nervousness of the unknown but expressed she doesn't feel anxious all the time.  Pt expressed her physical symptoms were shaking, increased heart rate and sweating.  Pt has thoughts of judgement, getting out of the situation and asking for help when anxious.  Pt stated her coping skills were going to sleep, eating and running to her partner.     Caroll Rancher, LRT/CTRS     Lillia Abed, Chaun Uemura A 10/05/2020 11:13 AM

## 2020-10-06 DIAGNOSIS — F200 Paranoid schizophrenia: Principal | ICD-10-CM

## 2020-10-06 MED ORDER — LORAZEPAM 2 MG PO TABS: 2.0000 mg | ORAL_TABLET | Freq: Two times a day (BID) | ORAL | 0 refills | Status: AC

## 2020-10-06 MED ORDER — TRAZODONE HCL 100 MG PO TABS: 100.0000 mg | ORAL_TABLET | Freq: Every day | ORAL | 0 refills | Status: AC

## 2020-10-06 MED ORDER — FLUPHENAZINE HCL 5 MG PO TABS: 15.0000 mg | ORAL_TABLET | Freq: Every day | ORAL | 0 refills | Status: AC

## 2020-10-06 MED ORDER — BENZTROPINE MESYLATE 1 MG PO TABS: 1.0000 mg | ORAL_TABLET | Freq: Two times a day (BID) | ORAL | 0 refills | Status: AC

## 2020-10-06 MED ORDER — FLUPHENAZINE HCL 10 MG PO TABS: 10.0000 mg | ORAL_TABLET | Freq: Two times a day (BID) | ORAL | 0 refills | Status: AC

## 2020-10-06 NOTE — BHH Suicide Risk Assessment (Signed)
[facility] Discharge Suicide Risk Assessment   Principal Problem: Schizophrenia, paranoid [facility]) Discharge Diagnoses: Principal Problem:   Schizophrenia, paranoid (HCC)   Total Time Spent in Direct Patient Care:  I personally spent 25 minutes on the unit in direct patient care. The direct patient care time included face-to-face time with the patient, reviewing the patient's chart, communicating with other professionals, and coordinating care. Greater than 50% of this time was spent in counseling or coordinating care with the patient regarding goals of hospitalization, psycho-education, and discharge planning needs.  Musculoskeletal: Strength & Muscle Tone: within normal limits Gait & Station: normal , steady Patient leans: N/A  Psychiatric Specialty Exam: Review of Systems  Constitutional: Negative for appetite change and fatigue.  Psychiatric/Behavioral: Negative for hallucinations, sleep disturbance and suicidal ideas.    Blood pressure 111/62, pulse (!) 113, temperature 97.9 F (36.6 C), temperature source Oral, resp. rate 14, last menstrual period 09/13/2020, SpO2 99 %.There is no height or weight on file to calculate BMI.  General Appearance: Fair hygiene, well engaged with examiner, appears stated age  Eye Contact::  Good  Speech:  Normal rate and fluency  Volume:  Normal  Mood:  Described as "good" - appears euthymic  Affect:  bright  Thought Process:  Superficially goal directed  Orientation:  Oriented to self, city, year  Thought Content:  Denies AVH, ideas of reference, or first rank symptoms; No delusions noted; denies paranoia  Suicidal Thoughts:  No  Homicidal Thoughts:  No  Memory:  Immediate;   Fair  Judgement:  Impaired  Insight:  Lacking  Psychomotor Activity:  Normal  Concentration:  Fair  Recall:  [employer] of Knowledge:Fair  Language: Good  Akathisia:  Negative  Assets:  Desire for Improvement Resilience Social Support  Sleep:  Number of Hours: 8.25   Cognition: WNL  ADL's:  Adequate   Mental Status Per Nursing Assessment::   On Admission: catatonic, disorganized, AH, paranoia  Demographic Factors:  Adolescent or young adult, unemployed  Loss Factors: NA  Historical Factors: Impulsivity, family history of mental illness  Risk Reduction Factors:   Positive social support, living with another person, religious beliefs  Continued Clinical Symptoms:  Previous Psychiatric Diagnoses and Treatments Schizophrenia  Cognitive Features That Contribute To Risk:  concrete    Suicide Risk:  Mild:  Suicidal ideation of limited frequency, intensity, duration, and specificity.  There are no identifiable plans, no associated intent, mild dysphoria and related symptoms, good self-control (both objective and subjective assessment), few other risk factors, and identifiable protective factors, including available and accessible social support.   Follow-up Information    [facility]. Go on 10/13/2020.   Specialty: Behavioral Health Why: You have a walk in appointment for therapy services on 10/13/20 at 7:45 am.  You also have a walk in appointment on 10/25/20 at 7:45 am for medication management.   Walk in appointments are first come, first served and are held in person. Contact information: [address] [place] [address] [phone]              Plan Of Care/Follow-up recommendations:  Patient will need metabolic lab monitoring, weight monitoring, EKG monitoring and AIMS monitoring while on 2 antipsychotics after discharge. Plan is for the patient to continue Invega sustenna monthly injection with Prolixin as a 2nd antipsychotic at this time with the hopes of tapering to monotherapy as an outpatient if tolerated. Patient has tried and failed 3 or more antipsychotic trials without sufficient improvement in  symptoms or functioning. Previous antipsychotic trials include: Risperdal, Invega sustenna and  po Invega, Zyprexa, Haldol, Abilify maintena, po Abilify, and Geodon IM. She will be additionally discharged on Ativan for catatonia prevention with the goal of tapering down on this dose as tolerated as an outpatient. See discharge summary for additional disposition plans.    Comer Locket, MD, FAPA 10/06/2020, 10:18 AM

## 2020-10-06 NOTE — Progress Notes (Signed)
  [facility] Adult Case Management Discharge Plan :  Will you be returning to the same living situation after discharge:  Yes,  with mother At discharge, do you have transportation home?: No.Will use Safe Transport Do you have the ability to pay for your medications: Yes,  has medicaid  Release of information consent forms completed and in the chart;  Patient's signature needed at discharge.  Patient to Follow up at:  Follow-up Information    [facility]. Go on 10/13/2020.   Specialty: Behavioral Health Why: You have a walk in appointment for therapy services on 10/13/20 at 7:45 am.  You also have a walk in appointment on 10/25/20 at 7:45 am for medication management.   Walk in appointments are first come, first served and are held in person. Contact information: [address] [place] [address] [phone]              Next level of care provider has access to [facility] Link:yes  Safety Planning and Suicide Prevention discussed: No. Unable to contact mom, pamphlet placed in chart.  Have you used any form of tobacco in the last 30 days? (Cigarettes, Smokeless Tobacco, Cigars, and/or Pipes): No  Has patient been referred to the Quitline?: N/A patient is not a smoker  Patient has been referred for addiction treatment: N/A  Felizardo Hoffmann, Theresia Majors 10/06/2020, 9:42 AM

## 2020-10-06 NOTE — Progress Notes (Signed)
Recreation Therapy Notes  Date: 1.5.22 Time: 1000 Location: 500 Hall Dayroom  Group Topic: Self-Esteem  Goal Area(s) Addresses:  Patient will identify positive character traits about themselves. Patient will identify benefits of positive self esteem.  Behavioral Response: Engaged  Intervention: Geneticist, molecular; Colored Pencils  Activity: Crest of Self.  Patients were given a crest divided into four parts.  In each section, patients were to identify what makes them a good friend, what characteristics they bring to their families, what they do for their communities and what has been their biggest accomplishment so far.  Education:  Self-esteem, Discharge planning  Education Outcome: Acknowledges understanding/In group clarification offered/Needs additional education  Clinical Observations/Feedback: Pt was bright and active in group.  Pt described herself as friendly; cooperative when in the community; caring/nice when with family and expressed she is nice for today.   Caroll Rancher, LRT/CTRS    Caroll Rancher A 10/06/2020 11:29 AM

## 2020-10-06 NOTE — BHH Counselor (Signed)
CSW contacted Lindsey Richmond by the number in [place], [phone], and was able to reach her. CSW informed pt's mother that she is doing better and is at baseline and she is planned to be discharged today. Mother asked if a ride could be arranged as her car is currently not working. CSW informed mom that safe transport could be set up and confirmed the address and shared that CSW will arrange ride for an 11:00 pick-up time. CSW explained pt's aftercare appointments and told mom the details could be found on pt's d/c paperwork. Mother stated she understood and is excited for pt to come home.   Fredirick Lathe, LCSWA Clinicial Social Worker [employer]

## 2020-10-06 NOTE — Progress Notes (Signed)
Patient ID: Lindsey Richmond, female   DOB: 01/07/1996, 25 y.o.   MRN: [id number] Patient discharged to home/self care on her own accord.  Patient denies SI, HI and AVH upon discharge. Patient's affect had improved from flat and non expressive to full range and appropriate to circumstance.  Patient acknowledges understanding of all discharge instructions and receipt of all personal belonging.

## 2020-10-06 NOTE — BHH Counselor (Addendum)
CSW attempted to contact pt's mother, Mother, Alexie Lanni ([phone]), to discuss pt d/c'ing 10/07/19 but call went directly to voicemail. CSW left a HIPPA compliant message at 9:28am on 10/07/19.  Fredirick Lathe, LCSWA Clinicial Social Worker [employer]

## 2020-10-06 NOTE — Discharge Summary (Signed)
Physician Discharge Summary Note  Patient:  Lindsey Richmond is an 25 y.o., female  MRN:  [id number]  DOB:  1995-11-29  Patient phone:  [phone] (home)   Patient address:   [place] [address],   Total Time spent with patient: Greater than 30 minutes  Date of Admission:  09/28/2020  Date of Discharge: 10-07-19  Reason for Admission:Worsening psychosis rendering patient catatonic.  Principal Problem: Schizophrenia, paranoid [facility])  Discharge Diagnoses: Principal Problem:   Schizophrenia, paranoid ([place])  Past Psychiatric History: Paranoid Schizophrenia.  Past Medical History:  Past Medical History:  Diagnosis Date  . Anxiety   . Depression   . Oppositional defiant behavior   . Schizo affective schizophrenia ([place])   . Schizophrenia ([place])    History reviewed. No pertinent surgical history.  Family History:  Family History  Problem Relation Age of Onset  . Heart failure Mother   . Hypertension Mother   . Diabetes Father    Family Psychiatric  History: See admission H&P.  Social History:  Social History   Substance and Sexual Activity  Alcohol Use No     Social History   Substance and Sexual Activity  Drug Use No    Social History   Socioeconomic History  . Marital status: Single    Spouse name: Not on file  . Number of children: Not on file  . Years of education: Not on file  . Highest education level: Not on file  Occupational History  . Occupation: Unemployed  Tobacco Use  . Smoking status: Never Smoker  . Smokeless tobacco: Never Used  Vaping Use  . Vaping Use: Never used  Substance and Sexual Activity  . Alcohol use: No  . Drug use: No  . Sexual activity: Never    Birth control/protection: None  Other Topics Concern  . Not on file  Social History Narrative   ** Merged History Encounter **       Pt lives with her mother, brother, and father.  She receives outpatient psychiatric care through [facility].   Social  Determinants of Health   Financial Resource Strain: Not on file  Food Insecurity: Not on file  Transportation Needs: Not on file  Physical Activity: Not on file  Stress: Not on file  Social Connections: Not on file   Hospital Course: (Per Md's admission evaluation notes): Patient is a 25 year old female with a longstanding past psychiatric history significant for schizophrenia well-known to this facility. The patient was recently admitted secondary to noncompliance with medications. She had become catatonic, had been placed on Haldol with Ativan. She was placed on oral paliperidone, and received an injectable long-acting paliperidone 156 mg while waiting for admission to our facility as well as 1 injection during the course of the hospitalization. Her family brought her back to the emergency department shortly thereafter again secondary to noncompliance with her medications. The patient presented by EMS with altered mental status. She was essentially catatonic on 09/27/20.She told the evaluator's in the emergency department that she came to the emergency department because her parents were not being "nice to her". On examination today she is essentially catatonic again. So far since she has arrived on the unit she has received Haldol 10 mg p.o. or IM every 12 hours as well as lorazepam 2 mg p.o. or IM twice daily as well. That helped relieve her catatonia on her last hospitalization. Of note, in the assessment note in the emergency department the mother confirmed that she is obtaining guardianship  which had been steadily declined over the last year. The mother reported that the patient had only been sleeping 4 to 5 hours a night, and did clearly appear to be responding to internal stimuli. She was admitted to the hospital for evaluation and stabilization.  Again this is one of several psychiatric discharge summaries for this 25 year old AA female with hx of chronic mental illness & multiple  psychiatric admissions. She is known in this [facility] & other psychiatric hospitals within the surrounding areas for worsening symptoms of paranoia, delusions & non-compliant to her treatment regimen. She has been tried on multiple psychotropic medications for her symptoms & it appears nothing has actually been helpful in stabilizing her symptoms because she does not always comply to taking them. However, this time around, her worsening psychosis did render this patient catatonic warranting this this present admission. Then, the issues with non-compliant to her recommended treatment regimen could be blamed as a potential barrier to her mental health stabilization. She was brought to the [facility] this time around for evaluation & treatment of worsening psychosis/catatonic state.   After evaluation of her presenting symptoms this time around, [facility] was recommended for mood stabilization treatments. The medication regimen for her presenting symptoms were discussed & initiated. She received, stabilized & was discharged on the medications as listed below on her discharge medication lists. She was also enrolled & participated sometimes in the group counseling sessions being offered & held on this unit. She learned coping skills. She presented on this admission, no other significant chronic medical conditions that required treatment & monitoring. She tolerated her treatment regimen without any adverse effects or reactions reported.    During the course of her hospitalization, the 15-minute checks were adequate to ensure Donte's safety.  Patient did not display any dangerous, violent or suicidal behavior on the unit. She interacted with patients & staff appropriately towards the end of her hospital stay. She eventually participated appropriately in the group sessions/therapies. Her medications were addressed & adjusted to meet her needs. She was recommended for outpatient follow-up care & medication management upon discharge to  assure her continuity of care.  At the time of discharge, patient is not reporting any acute suicidal/homicidal ideations. She feels more confident about her self-care & in taking her medications. She currently denies any new issues or concerns. Education and supportive counseling provided throughout her hospital stay & upon discharge.   Today upon her discharge evaluation with the attending psychiatrist, Detrice shares she is doing well. She denies any other specific concerns. She is sleeping well. Her appetite is good. She denies other physical complaints. She denies AH/VH. She feels that her medications have been helpful & is in agreement to continue her current treatment regimen as recommended. She was able to engage in safety planning including plan to return to [facility] or contact emergency services if she feels unable to maintain her own safety or the safety of others. Pt had no further questions, comments, or concerns. She left [facility] with all personal belongings in no apparent distress. Transportation per the [employer].  Physical Findings: AIMS: Facial and Oral Movements Muscles of Facial Expression: None, normal Lips and Perioral Area: None, normal Jaw: None, normal Tongue: None, normal,Extremity Movements Upper (arms, wrists, hands, fingers): None, normal Lower (legs, knees, ankles, toes): None, normal, Trunk Movements Neck, shoulders, hips: None, normal, Overall Severity Severity of abnormal movements (highest score from questions above): None, normal Incapacitation due to abnormal movements: None, normal Patient's awareness  of abnormal movements (rate only patient's report): No Awareness, Dental Status Current problems with teeth and/or dentures?: No Does patient usually wear dentures?: No  CIWA:    COWS:     Musculoskeletal: Strength & Muscle Tone: within normal limits Gait & Station: normal Patient leans: N/A  Psychiatric Specialty Exam: Physical Exam Vitals  and nursing note reviewed.  Constitutional:      Appearance: She is obese.  HENT:     Head: Normocephalic and atraumatic.     Nose: Nose normal.     Mouth/Throat:     Pharynx: Oropharynx is clear.  Eyes:     Pupils: Pupils are equal, round, and reactive to light.  Cardiovascular:     Rate and Rhythm: Normal rate.     Pulses: Normal pulses.  Pulmonary:     Effort: Pulmonary effort is normal.  Genitourinary:    Comments: Deferred Musculoskeletal:        General: Normal range of motion.     Cervical back: Normal range of motion.  Skin:    General: Skin is warm and dry.  Neurological:     General: No focal deficit present.     Mental Status: She is alert and oriented to person, place, and time.     Review of Systems  Constitutional: Negative for chills, diaphoresis and fever.  HENT: Negative for rhinorrhea, sneezing and sore throat.   Eyes: Negative for discharge.  Respiratory: Negative for cough, shortness of breath and wheezing.   Cardiovascular: Negative for chest pain and palpitations.  Gastrointestinal: Negative for diarrhea, nausea and vomiting.  Endocrine: Negative for cold intolerance.  Genitourinary: Negative for difficulty urinating.  Musculoskeletal: Negative for arthralgias and myalgias.  Skin: Negative.   Allergic/Immunologic: Negative for environmental allergies, food allergies and immunocompromised state.       Allergies: NKDA  Neurological: Negative for dizziness, tremors, seizures, syncope, facial asymmetry, speech difficulty, weakness, light-headedness, numbness and headaches.  Psychiatric/Behavioral: Positive for dysphoric mood (Stabilized with medications prior to discharge), hallucinations (Hx. Psychosis (Stabilized with medication prior to discharge)) and sleep disturbance (Stabilized with medication prior to discharge). Negative for agitation, behavioral problems, confusion, decreased concentration, self-injury and suicidal ideas. The patient is not  nervous/anxious (Stable upon discharge) and is not hyperactive.     Blood pressure 111/62, pulse (!) 113, temperature 97.9 F (36.6 C), temperature source Oral, resp. rate 14, last menstrual period 09/13/2020, SpO2 99 %.There is no height or weight on file to calculate BMI.  See Md's discharge SRA  Sleep:  Number of Hours: 8.25   Have you used any form of tobacco in the last 30 days? (Cigarettes, Smokeless Tobacco, Cigars, and/or Pipes): No  Has this patient used any form of tobacco in the last 30 days? (Cigarettes, Smokeless Tobacco, Cigars, and/or Pipes): No  Blood Alcohol level:  Lab Results  Component Value Date   ETH <10 09/27/2020   ETH <10 09/09/2020   Metabolic Disorder Labs:  Lab Results  Component Value Date   HGBA1C 5.6 12/27/2019   MPG 114.02 12/27/2019   MPG 105.41 12/04/2019   Lab Results  Component Value Date   PROLACTIN 57.7 (H) 12/04/2019   PROLACTIN 75.7 (H) 10/23/2019   Lab Results  Component Value Date   CHOL 145 12/27/2019   TRIG 59 12/27/2019   HDL 24 (L) 12/27/2019   CHOLHDL 6.0 12/27/2019   VLDL 12 12/27/2019   LDLCALC 109 (H) 12/27/2019   LDLCALC 100 (H) 12/04/2019   See Psychiatric Specialty Exam and Suicide Risk  Assessment completed by Attending Physician prior to discharge.  Discharge destination:  Home  Is patient on multiple antipsychotic therapies at discharge: No Do you recommend tapering to monotherapy for antipsychotics?  NA   Has Patient had three or more failed trials of antipsychotic monotherapy by history:  Yes,   Antipsychotic medications that previously failed include:   1.  Haldol, ., 2.  Paliperidine. and 3.  Risperdal.  Recommended Plan for Multiple Antipsychotic Therapies: NA  Allergies as of 10/06/2020   No Known Allergies     Medication List    STOP taking these medications   cloNIDine 0.1 MG tablet Commonly known as: CATAPRES   haloperidol 5 MG tablet Commonly known as: HALDOL   Invega Sustenna 156 MG/ML Susy  injection Generic drug: paliperidone   paliperidone 9 MG 24 hr tablet Commonly known as: INVEGA     TAKE these medications     Indication  benztropine 1 MG tablet Commonly known as: COGENTIN Take 1 tablet (1 mg total) by mouth 2 (two) times daily. For prevention of tremors  Indication: Extrapyramidal Reaction caused by Medications   fluPHENAZine 10 MG tablet Commonly known as: PROLIXIN Take 1 tablet (10 mg total) by mouth 2 (two) times daily. For mood control  Indication: Mood control   fluPHENAZine 5 MG tablet Commonly known as: PROLIXIN Take 3 tablets (15 mg total) by mouth at bedtime.  Indication: Mood control   LORazepam 2 MG tablet Commonly known as: ATIVAN Take 1 tablet (2 mg total) by mouth 2 (two) times daily. For severe anxiety What changed:   medication strength  how much to take  when to take this  additional instructions  Indication: Feeling Anxious   traZODone 100 MG tablet Commonly known as: DESYREL Take 1 tablet (100 mg total) by mouth at bedtime. For sleep What changed: additional instructions  Indication: Trouble Sleeping       Follow-up Information    [facility]. Go on 10/13/2020.   Specialty: Behavioral Health Why: You have a walk in appointment for therapy services on 10/13/20 at 7:45 am.  You also have a walk in appointment on 10/25/20 at 7:45 am for medication management.   Walk in appointments are first come, first served and are held in person. Contact information: [phone] [place] [address] [phone]             Follow-up recommendations: Activity:  As tolerated Diet: As recommended by your primary care doctor. Keep all scheduled follow-up appointments as recommended.  Comments: Prescriptions given at discharge.  Patient agreeable to plan.  Given opportunity to ask questions.  Appears to feel comfortable with discharge denies any current suicidal or homicidal thought. Patient is  also instructed prior to discharge to: Take all medications as prescribed by his/her mental healthcare provider. Report any adverse effects and or reactions from the medicines to his/her outpatient provider promptly. Patient has been instructed & cautioned: To not engage in alcohol and or illegal drug use while on prescription medicines. In the event of worsening symptoms, patient is instructed to call the crisis hotline, 911 and or go to the nearest ED for appropriate evaluation and treatment of symptoms. To follow-up with his/her primary care provider for your other medical issues, concerns and or health care needs.  Signed: Armandina Stammer, NP, PMHNP, FNP-BC 10/06/2020, 10:31 AM

## 2020-10-06 NOTE — Plan of Care (Signed)
  Problem: Education: Goal: Knowledge of [facility] Education information/materials will improve Outcome: Adequate for Discharge Goal: Emotional status will improve Outcome: Adequate for Discharge Goal: Mental status will improve Outcome: Adequate for Discharge Goal: Verbalization of understanding the information provided will improve Outcome: Adequate for Discharge   Problem: Activity: Goal: Interest or engagement in activities will improve Outcome: Adequate for Discharge Goal: Sleeping patterns will improve Outcome: Adequate for Discharge   Problem: Coping: Goal: Ability to verbalize frustrations and anger appropriately will improve Outcome: Adequate for Discharge Goal: Ability to demonstrate self-control will improve Outcome: Adequate for Discharge   Problem: Health Behavior/Discharge Planning: Goal: Identification of resources available to assist in meeting health care needs will improve Outcome: Adequate for Discharge Goal: Compliance with treatment plan for underlying cause of condition will improve Outcome: Adequate for Discharge   Problem: Physical Regulation: Goal: Ability to maintain clinical measurements within normal limits will improve Outcome: Adequate for Discharge   Problem: Safety: Goal: Periods of time without injury will increase Outcome: Adequate for Discharge   Problem: Education: Goal: Will be free of psychotic symptoms Outcome: Adequate for Discharge Goal: Knowledge of the prescribed therapeutic regimen will improve Outcome: Adequate for Discharge   Problem: Coping: Goal: Coping ability will improve Outcome: Adequate for Discharge Goal: Will verbalize feelings Outcome: Adequate for Discharge   Problem: Health Behavior/Discharge Planning: Goal: Compliance with prescribed medication regimen will improve Outcome: Adequate for Discharge   Problem: Nutritional: Goal: Ability to achieve adequate nutritional intake will improve Outcome:  Adequate for Discharge   Problem: Safety: Goal: Ability to redirect hostility and anger into socially appropriate behaviors will improve Outcome: Adequate for Discharge Goal: Ability to remain free from injury will improve Outcome: Adequate for Discharge   Problem: Self-Care: Goal: Ability to participate in self-care as condition permits will improve Outcome: Adequate for Discharge   Problem: Self-Concept: Goal: Will verbalize positive feelings about self Outcome: Adequate for Discharge   Problem: Self-Concept: Goal: Level of anxiety will decrease Outcome: Adequate for Discharge Goal: Ability to modify response to factors that promote anxiety will improve Outcome: Adequate for Discharge  Patient discharged to home/self care in on her own accord

## 2020-10-18 ENCOUNTER — Encounter (HOSPITAL_COMMUNITY): Payer: Self-pay | Admitting: Emergency Medicine

## 2020-10-18 ENCOUNTER — Emergency Department (HOSPITAL_COMMUNITY)
Admission: EM | Admit: 2020-10-18 | Discharge: 2020-10-19 | Disposition: A | Discharge status: Home / Self Care | Payer: Medicare Other | Attending: Emergency Medicine | Admitting: Emergency Medicine

## 2020-10-18 DIAGNOSIS — Z046 Encounter for general psychiatric examination, requested by authority: Secondary | ICD-10-CM | POA: Insufficient documentation

## 2020-10-18 DIAGNOSIS — F061 Catatonic disorder due to known physiological condition: Secondary | ICD-10-CM

## 2020-10-18 DIAGNOSIS — F202 Catatonic schizophrenia: Secondary | ICD-10-CM | POA: Insufficient documentation

## 2020-10-18 DIAGNOSIS — F250 Schizoaffective disorder, bipolar type: Secondary | ICD-10-CM | POA: Diagnosis present

## 2020-10-18 DIAGNOSIS — F200 Paranoid schizophrenia: Secondary | ICD-10-CM | POA: Diagnosis present

## 2020-10-18 LAB — CBC
HCT: 45.1 % (ref 36.0–46.0)
Hemoglobin: 14.4 g/dL (ref 12.0–15.0)
MCH: 30.4 pg (ref 26.0–34.0)
MCHC: 31.9 g/dL (ref 30.0–36.0)
MCV: 95.1 fL (ref 80.0–100.0)
Platelets: 278 10*3/uL (ref 150–400)
RBC: 4.74 MIL/uL (ref 3.87–5.11)
RDW: 13 % (ref 11.5–15.5)
WBC: 8.6 10*3/uL (ref 4.0–10.5)
nRBC: 0 % (ref 0.0–0.2)

## 2020-10-18 LAB — COMPREHENSIVE METABOLIC PANEL
ALT: 20 U/L (ref 0–44)
AST: 19 U/L (ref 15–41)
Albumin: 4.2 g/dL (ref 3.5–5.0)
Alkaline Phosphatase: 53 U/L (ref 38–126)
Anion gap: 14 (ref 5–15)
BUN: 16 mg/dL (ref 6–20)
CO2: 24 mmol/L (ref 22–32)
Calcium: 9.9 mg/dL (ref 8.9–10.3)
Chloride: 99 mmol/L (ref 98–111)
Creatinine, Ser: 1.14 mg/dL — ABNORMAL HIGH (ref 0.44–1.00)
GFR, Estimated: >60 mL/min (ref >60)
Glucose, Bld: 93 mg/dL (ref 70–99)
Potassium: 4.1 mmol/L (ref 3.5–5.1)
Sodium: 137 mmol/L (ref 135–145)
Total Bilirubin: 0.7 mg/dL (ref 0.3–1.2)
Total Protein: 10.3 g/dL — ABNORMAL HIGH (ref 6.5–8.1)

## 2020-10-18 LAB — I-STAT BETA HCG BLOOD, ED (MC, WL, AP ONLY): I-stat hCG, quantitative: <5 m[IU]/mL (ref <5)

## 2020-10-18 LAB — ACETAMINOPHEN LEVEL: Acetaminophen (Tylenol), Serum: <10 ug/mL — ABNORMAL LOW (ref 10–30)

## 2020-10-18 LAB — ETHANOL: Alcohol, Ethyl (B): <10 mg/dL (ref <10)

## 2020-10-18 LAB — SALICYLATE LEVEL: Salicylate Lvl: <7 mg/dL — ABNORMAL LOW (ref 7.0–30.0)

## 2020-10-18 MED ORDER — LORAZEPAM 2 MG/ML IJ SOLN: 2.0000 mg | Freq: Two times a day (BID) | INTRAMUSCULAR | Status: DC | PRN

## 2020-10-18 MED ORDER — FLUPHENAZINE HCL 5 MG PO TABS
15.0000 mg | ORAL_TABLET | Freq: Every day | ORAL | Status: DC
  Filled 2020-10-18: qty 3

## 2020-10-18 MED ORDER — BENZTROPINE MESYLATE 1 MG PO TABS
1.0000 mg | ORAL_TABLET | Freq: Two times a day (BID) | ORAL | Status: DC
  Filled 2020-10-18: qty 1

## 2020-10-18 MED ORDER — FLUPHENAZINE HCL 5 MG PO TABS
10.0000 mg | ORAL_TABLET | Freq: Two times a day (BID) | ORAL | Status: DC
  Filled 2020-10-18 (×2): qty 2

## 2020-10-18 MED ORDER — LORAZEPAM 1 MG PO TABS: 2.0000 mg | ORAL_TABLET | Freq: Two times a day (BID) | ORAL | Status: DC | PRN

## 2020-10-18 NOTE — ED Notes (Signed)
Pt aware urine sample is needed. Pt refused to go to restroom at this time.

## 2020-10-18 NOTE — BH Assessment (Signed)
[name], NP, recommends observation for safety and stabilization with psych reassessment in the AM. Per [name], NP, no appropriate beds at [facility].

## 2020-10-18 NOTE — Progress Notes (Signed)
  Jovee Dettinger is a 25 y.o. female with a history of schizophrenia who presents to [facility] with EMS due to catatonia. Chart reviewed and case discussed with TTS. Patient was inpatient at [facility] 09/28/20 to 10/06/2020 for a similar presentation. She was discharged on fluphenazine 10 mg BID, fluphenazine 15 mg QHS, trazodone 100 mg QHS, cogentin 1 mg BID, and lorazepam 2 mg BID prn.  EDP Note 10/18/2020: Thursa Emme is a 25 y.o. female with a past medical history of schizophrenia, ODD presenting to the ED for catatonic state.  History is provided by mother over the phone.  States that she has had similar episodes of being in a catatonic state in the past.  She is that she was discharged recently from behavioral health hospital.  She feels that she has recurrence of these episodes of "even when she delays her medications by a few hours."  States that she has been compliant with her medications.  Yesterday essentially had to be persuaded to take her medications ended up taking them late.  Denies any thoughts of wanting to harm herself.  Denies any violent behavior. The only thing patient tells me is that she wants to be in a bed instead of a wheelchair.  TTS Assessment 10/18/2020: Dashanae Longfield is a 24 year old female presenting to [facility] under IVC. Upon assessment patient was sitting on the floor. Chart review utilized in addition to collateral information to complete assessment. It is unclear if she understands the content of assessors questions. She replies "No" to questions regarding SI/HI/AVH. Patient is displaying catatonic symptoms of non responsive when asked assessment questions and is observed to be looking straight ahead with a fixed stare.    Treatment Plan Summary:  Observe Overnight Daily contact with patient to assess and evaluate symptoms and progress in treatment and Medication management  Change Ativan to Ativan 2 mg BID PO or IM prn for catatonia or severe anxiety  Resume home medications as  below: -Fluphenazine 10 mg oral BID for psychosis/mood stability -Fluphenazine 15 mg oral QHS for psychosis/mood stability -Benztropine 1 mg oral BID for EPS   [name], APRN, FNP-BC, PMHNP-BC

## 2020-10-18 NOTE — ED Provider Notes (Signed)
[facility]-EMERGENCY DEPT Provider Note   CSN: [id number] Arrival date & time: 10/18/20  1248     History Chief Complaint  Patient presents with  . Psychiatric Evaluation    Lindsey Richmond is a 25 y.o. female with a past medical history of schizophrenia, ODD presenting to the ED for catatonic state.  History is provided by mother over the phone.  States that she has had similar episodes of being in a catatonic state in the past.  She is that she was discharged recently from behavioral health hospital.  She feels that she has recurrence of these episodes of "even when she delays her medications by a few hours."  States that she has been compliant with her medications.  Yesterday essentially had to be persuaded to take her medications ended up taking them late.  Denies any thoughts of wanting to harm herself.  Denies any violent behavior. The only thing patient tells me is that she wants to be in a bed instead of a wheelchair.   HPI     Past Medical History:  Diagnosis Date  . Anxiety   . Depression   . Oppositional defiant behavior   . Schizo affective schizophrenia (HCC)   . Schizophrenia [facility])     Patient Active Problem List   Diagnosis Date Noted  . Schizoaffective disorder, bipolar type (HCC)   . Schizo-affective schizophrenia (HCC) 12/25/2019  . Schizophrenia (HCC) 12/25/2019  . Schizophrenia (HCC) 01/15/2019  . Schizophrenia, paranoid (HCC) 04/20/2017    History reviewed. No pertinent surgical history.   OB History   No obstetric history on file.     Family History  Problem Relation Age of Onset  . Heart failure Mother   . Hypertension Mother   . Diabetes Father     Social History   Tobacco Use  . Smoking status: Never Smoker  . Smokeless tobacco: Never Used  Vaping Use  . Vaping Use: Never used  Substance Use Topics  . Alcohol use: No  . Drug use: No    Home Medications Prior to Admission medications   Medication Sig Start  Date End Date Taking? Authorizing Provider  benztropine (COGENTIN) 1 MG tablet Take 1 tablet (1 mg total) by mouth 2 (two) times daily. For prevention of tremors 10/06/20   Armandina Stammer I, NP  fluPHENAZine (PROLIXIN) 10 MG tablet Take 1 tablet (10 mg total) by mouth 2 (two) times daily. For mood control 10/06/20   Armandina Stammer I, NP  fluPHENAZine (PROLIXIN) 5 MG tablet Take 3 tablets (15 mg total) by mouth at bedtime. 10/06/20   Armandina Stammer I, NP  LORazepam (ATIVAN) 2 MG tablet Take 1 tablet (2 mg total) by mouth 2 (two) times daily. For severe anxiety 10/06/20   Armandina Stammer I, NP  traZODone (DESYREL) 100 MG tablet Take 1 tablet (100 mg total) by mouth at bedtime. For sleep 10/06/20   Armandina Stammer I, NP    Allergies    Patient has no known allergies.  Review of Systems   Review of Systems  Unable to perform ROS: Psychiatric disorder    Physical Exam Updated Vital Signs BP 129/89   Pulse 84   Temp 98.7 F (37.1 C) (Oral)   Resp 18   SpO2 99%   Physical Exam Vitals and nursing note reviewed.  Constitutional:      General: She is not in acute distress.    Appearance: She is well-developed and well-nourished. She is obese.  Comments: Patient staring at the wall.  She will move her head to look at me.  The only thing she tells me is that she would like to lay in a bed.  HENT:     Head: Normocephalic and atraumatic.     Nose: Nose normal.  Eyes:     General: No scleral icterus.       Right eye: No discharge.        Left eye: No discharge.     Extraocular Movements: EOM normal.     Conjunctiva/sclera: Conjunctivae normal.     Pupils: Pupils are equal, round, and reactive to light.  Cardiovascular:     Rate and Rhythm: Normal rate and regular rhythm.     Pulses: Intact distal pulses.     Heart sounds: Normal heart sounds. No murmur heard. No friction rub. No gallop.   Pulmonary:     Effort: Pulmonary effort is normal. No respiratory distress.     Breath sounds: Normal breath  sounds.  Abdominal:     General: Bowel sounds are normal. There is no distension.     Palpations: Abdomen is soft.     Tenderness: There is no abdominal tenderness. There is no guarding.  Musculoskeletal:        General: No edema. Normal range of motion.     Cervical back: Normal range of motion and neck supple.  Skin:    General: Skin is warm and dry.     Findings: No rash.  Neurological:     Mental Status: She is alert.     Motor: No abnormal muscle tone.     Coordination: Coordination normal.     Comments: She will lift her legs when I asked her to.  She will attempt to squeeze my fingers with poor effort.  No facial asymmetry.  Psychiatric:        Mood and Affect: Mood and affect normal.     ED Results / Procedures / Treatments   Labs (all labs ordered are listed, but only abnormal results are displayed) Labs Reviewed  COMPREHENSIVE METABOLIC PANEL - Abnormal; Notable for the following components:      Result Value   Creatinine, Ser 1.14 (*)    Total Protein 10.3 (*)    All other components within normal limits  SALICYLATE LEVEL - Abnormal; Notable for the following components:   Salicylate Lvl <7.0 (*)    All other components within normal limits  ACETAMINOPHEN LEVEL - Abnormal; Notable for the following components:   Acetaminophen (Tylenol), Serum <10 (*)    All other components within normal limits  ETHANOL  CBC  RAPID URINE DRUG SCREEN, HOSP PERFORMED  I-STAT BETA HCG BLOOD, ED ([facility], [facility], [facility] ONLY)    EKG None  Radiology No results found.  Procedures Procedures (including critical care time)  Medications Ordered in ED Medications - No data to display  ED Course  I have reviewed the triage vital signs and the nursing notes.  Pertinent labs & imaging results that were available during my care of the patient were reviewed by me and considered in my medical decision making (see chart for details).    MDM Rules/Calculators/A&P                           25 year old female presenting to the ED for concern for catatonic state.  She has a history of schizophrenia and her mother is concerned that this happens to her  when she delays taking her medication by a few hours.  Stated this is essentially what happened yesterday.  She has been compliant with her medications and was in her usual state of health until yesterday.  Denies any violent behavior or attempts at wanting to hurt herself or others.  During my evaluation patient is alert, will look at me but will not answer my questions.  She will attempt to follow my commands and moving her extremities but with poor effort.  Her vital signs are within normal limits.  Medical screening lab work is unremarkable.  Patient is medically cleared for TTS evaluation and disposition.    Portions of this note were generated with Scientist, clinical (histocompatibility and immunogenetics). Dictation errors may occur despite best attempts at proofreading.  Final Clinical Impression(s) / ED Diagnoses Final diagnoses:  Catatonic state    Rx / DC Orders ED Discharge Orders    None       Dietrich Pates, PA-C 10/18/20 1921    Alvira Monday, MD 10/19/20 4742    Alvira Monday, MD 10/31/20 1021

## 2020-10-18 NOTE — BH Assessment (Signed)
Comprehensive Clinical Assessment (CCA) Note  10/18/2020 Lindsey Richmond [id number]   Lindsey Richmond is a 25 year old female presenting to [facility] under IVC. Upon assessment patient was sitting on the floor. Chart review utilized in addition to collateral information to complete assessment. It is unclear if she understands the content of assessors questions. She replies "No" to questions regarding SI/HI/AVH. Patient is displaying catatonic symptoms of non responsive when asked assessment questions and is observed to be looking straight ahead with a fixed stare.   Per EMS-states her brother called EMS due to patient having a "catatonic" episode-history of the same-patient not responding to simple commands-family states she is taking meds  Per ED Provider, Idelle Leech, Hina, PA-C.  Lindsey Richmond is a 25 y.o. female with a past medical history of schizophrenia, ODD presenting to the ED for catatonic state. History is provided by mother over the phone.  States that she has had similar episodes of being in a catatonic state in the past. She is that she was discharged recently from behavioral health hospital.  She feels that she has recurrence of these episodes of "even when she delays her medications by a few hours."  States that she has been compliant with her medications.  Yesterday essentially had to be persuaded to take her medications ended up taking them late.  Denies any thoughts of wanting to harm herself. Denies any violent behavior. The only thing patient tells me is that she wants to be in a bed instead of a wheelchair.  According to prior notes, patient is seen by [facility] on outpatient basis and treated with paliperidone long-acting injections,most recently 09/12/2020 during last hospitalization.  Disposition [name], NP, recommends observation for safety and stabilization with psych reassessment in the AM. Per [name], NP, no appropriate beds at [facility].  Chief Complaint:  Chief Complaint  Patient  presents with  . Psychiatric Evaluation   Visit Diagnosis: Schizophrenia (per history)  CCA Screening, Triage and Referral (STR)  Patient Reported Information How did you hear about [place]? Self  Referral name: No data recorded Referral phone number: No data recorded  Whom do you see for routine medical problems? Other (Comment) [facility])  Practice/Facility Name: No data recorded Practice/Facility Phone Number: No data recorded Name of Contact: No data recorded Contact Number: No data recorded Contact Fax Number: No data recorded Prescriber Name: No data recorded Prescriber Address (if known): No data recorded  What Is the Reason for Your Visit/Call Today? Pt is displaying psychosis  How Long Has This Been Causing You Problems? <Week  What Do You Feel Would Help You the Most Today? Medication   Have You Recently Been in Any Inpatient Treatment (Hospital/Detox/Crisis Center/28-Day Program)? Yes  Name/Location of Program/Hospital:Pt was recently seen at [facility] and did not meet admission criteria at that time.  How Long Were You There? 12/9-12/24  When Were You Discharged? 09/24/2020   Have You Ever Received Services From [employer] [facility] [facility] Before? Yes  Who Do You See at [facility]? [facility] admission on 09/09/20   Have You Recently Had Any Thoughts About Hurting Yourself? No  Are You Planning to Commit Suicide/Harm Yourself At This time? No   Have you Recently Had Thoughts About Hurting Someone [name]? No  Explanation: No data recorded  Have You Used Any Alcohol or Drugs in the Past 24 Hours? No  How Long Ago Did You Use Drugs or Alcohol? No data recorded What Did You Use and How Much? No data recorded  Do You Currently  Have a Therapist/Psychiatrist? Yes  Name of Therapist/Psychiatrist: [facility]   Have You Been Recently Discharged From Any Office Practice or Programs? No  Explanation of Discharge From Practice/Program: No data recorded    CCA Screening Triage Referral  Assessment Type of Contact: Face-to-Face  Is this Initial or Reassessment? No data recorded Date Telepsych consult ordered in [facility]:  No data recorded Time Telepsych consult ordered in [facility]:  No data recorded  Patient Reported Information Reviewed? Yes  Patient Left Without Being Seen? No data recorded Reason for Not Completing Assessment: Pt closed eyes, feigned sleep (Refused to answer questions)   Collateral Involvement: Attempted to call mother   Does Patient Have a Court Appointed Legal Guardian? No data recorded Name and Contact of Legal Guardian: self  If Minor and Not Living with Parent(s), Who has Custody? No data recorded Is CPS involved or ever been involved? Never  Is APS involved or ever been involved? Never   Patient Determined To Be At Risk for Harm To Self or Others Based on Review of Patient Reported Information or Presenting Complaint? No  Method: No data recorded Availability of Means: No data recorded Intent: No data recorded Notification Required: No data recorded Additional Information for Danger to Others Potential: No data recorded Additional Comments for Danger to Others Potential: No data recorded Are There Guns or Other Weapons in Your Home? No  Types of Guns/Weapons: No data recorded Are These Weapons Safely Secured?                            No data recorded Who Could Verify You Are Able To Have These Secured: No data recorded Do You Have any Outstanding Charges, Pending Court Dates, Parole/Probation? No data recorded Contacted To Inform of Risk of Harm To Self or Others: Other: Comment (NA)   [facility]: [facility]   Does Patient Present under Involuntary Commitment? No  IVC Papers Initial File Date: No data recorded  [place] of Residence: [place]   Patient Currently Receiving the Following Services: Medication Management  Determination of Need: -- (To be determined)  Options For Referral: -- (To be determined)  CCA  Biopsychosocial Intake/Chief Complaint:  Ongoing psychosis  Current Symptoms/Problems: Altered mental status  Patient Reported Schizophrenia/Schizoaffective Diagnosis in Past: Yes  Strengths: UTA  Preferences: UTA  Abilities: UTA   Type of Services Patient Feels are Needed: UTA   Initial Clinical Notes/Concerns: UTA   Mental Health Symptoms Depression:  -- (UTA)   Duration of Depressive symptoms: Greater than two weeks   Mania:  -- (UTA)   Anxiety:   -- (UTA)   Psychosis:  Grossly disorganized speech; Other negative symptoms   Duration of Psychotic symptoms: Less than six months   Trauma:  -- (UTA)   Obsessions:  -- (UTA)   Compulsions:  -- (UTA)   Inattention:  -- (UTA)   Hyperactivity/Impulsivity:  -- (UTA)   Oppositional/Defiant Behaviors:  -- (UTA)   Emotional Irregularity:  -- (UTA)   Other Mood/Personality Symptoms:  No data recorded   Mental Status Exam Appearance and self-care  Stature:  Average   Weight:  Obese   Clothing:  Disheveled   Grooming:  Neglected   Cosmetic use:  None   Posture/gait:  Bizarre   Motor activity:  Agitated   Sensorium  Attention:  Confused   Concentration:  Preoccupied   Orientation:  -- (UTA)   Recall/memory:  -- (UTA)   Affect and  Mood  Affect:  Restricted   Mood:  Negative   Relating  Eye contact:  Fleeting   Facial expression:  Angry   Attitude toward examiner:  Uninterested   Thought and Language  Speech flow: Blocked   Thought content:  -- (UTA)   Preoccupation:  -- (UTA)   Hallucinations:  Auditory   Organization:  No data recorded  [employer] of Knowledge:  Poor   Intelligence:  Below average   Abstraction:  Abstract   Judgement:  Impaired   Reality Testing:  Distorted   Insight:  Poor   Decision Making:  Only simple   Social Functioning  Social Maturity:  Irresponsible   Social Judgement:  -- Industrial/product designer)   Stress  Stressors:  -- (UTA)   Coping  Ability:  -- Industrial/product designer)   Skill Deficits:  -- Industrial/product designer)   Supports:  Family     Religion: Religion/Spirituality Are You A Religious Person?: No  Leisure/Recreation: Leisure / Recreation Do You Have Hobbies?: No  Exercise/Diet: Exercise/Diet Do You Exercise?: No Have You Gained or Lost A Significant Amount of Weight in the Past Six Months?: No Do You Follow a Special Diet?: No Do You Have Any Trouble Sleeping?: No  CCA Employment/Education Employment/Work Situation: Employment / Work Psychologist, occupational Employment situation: On disability Patient's job has been impacted by current illness: No What is the longest time patient has a held a job?: 2 years Where was the patient employed at that time?: [employer] Has patient ever been in the [employer]?: No  Education: Education Name of [employer] School: UTA Did Garment/textile technologist From [employer]?:  (UTA) Did You Attend College?:  (UTA) Did You Attend Graduate School?:  (UTA) Did You Have An Individualized Education Program (IIEP):  (UTA) Did You Have Any Difficulty At School?:  (UTA)   CCA Family/Childhood History Family and Relationship History: Family history Marital status: Single Are you sexually active?: Yes What is your sexual orientation?: heterosexual Has your sexual activity been affected by drugs, alcohol, medication, or emotional stress?: n/a  Does patient have children?: No  Childhood History:  Childhood History By whom was/is the patient raised?: Both parents Additional childhood history information: born in [place].  Description of patient's relationship with caregiver when they were a child: "I got put into some sports.  They raised me up well.  And I raised myself." How were you disciplined when you got in trouble as a child/adolescent?: Spankings Does patient have siblings?: No Did patient suffer any verbal/emotional/physical/sexual abuse as a child?: No Did patient suffer from severe childhood neglect?: No Has patient  ever been sexually abused/assaulted/raped as an adolescent or adult?: No Was the patient ever a victim of a crime or a disaster?: No Witnessed domestic violence?: No Has patient been affected by domestic violence as an adult?: No  CCA Substance Use Alcohol/Drug Use: Alcohol / Drug Use Pain Medications: see MAR Prescriptions: see MAR Over the Counter: see MAR History of alcohol / drug use?: No history of alcohol / drug abuse Longest period of sobriety (when/how long): none Negative Consequences of Use: Financial Withdrawal Symptoms: Other (Comment) (none)   ASAM's:  Six Dimensions of Multidimensional Assessment  Dimension 1:  Acute Intoxication and/or Withdrawal Potential:      Dimension 2:  Biomedical Conditions and Complications:      Dimension 3:  Emotional, Behavioral, or Cognitive Conditions and Complications:     Dimension 4:  Readiness to Change:     Dimension 5:  Relapse, Continued use,  or Continued Problem Potential:     Dimension 6:  Recovery/Living Environment:     ASAM Severity Score:    ASAM Recommended Level of Treatment:     Substance use Disorder (SUD)   Recommendations for Services/Supports/Treatments:   DSM5 Diagnoses: Patient Active Problem List   Diagnosis Date Noted  . Schizoaffective disorder, bipolar type (HCC)   . Schizo-affective schizophrenia (HCC) 12/25/2019  . Schizophrenia (HCC) 12/25/2019  . Schizophrenia (HCC) 01/15/2019  . Schizophrenia, paranoid (HCC) 04/20/2017    Patient Centered Plan: Patient is on the following Treatment Plan(s):   Referrals to Alternative Service(s): Referred to Alternative Service(s):   Place:   Date:   Time:    Referred to Alternative Service(s):   Place:   Date:   Time:    Referred to Alternative Service(s):   Place:   Date:   Time:    Referred to Alternative Service(s):   Place:   Date:   Time:     Burnetta SabinLatisha D Trig Mcbryar, [facility]

## 2020-10-18 NOTE — ED Notes (Signed)
Patient has a red tube in the main lab

## 2020-10-18 NOTE — ED Triage Notes (Signed)
Per EMS-states her brother called EMS due to patient having a "catatonic" episode-history of the same-patient not responding to simple commands-family states she is taking meds

## 2020-10-19 ENCOUNTER — Encounter (HOSPITAL_COMMUNITY): Payer: Self-pay | Admitting: Registered Nurse

## 2020-10-19 DIAGNOSIS — F202 Catatonic schizophrenia: Secondary | ICD-10-CM

## 2020-10-19 DIAGNOSIS — F061 Catatonic disorder due to known physiological condition: Secondary | ICD-10-CM

## 2020-10-19 NOTE — Progress Notes (Signed)
TOC CM/CSW updated Katie, RN on [employer] driver who will be arriving at 1:51pm.  CSW will continue to follow for dc needs.  Zoe Goonan Tarpley-Carter, MSW, LCSW-A Pronouns:  She, Her, Hers                  [facility] ED Transitions of CareClinical Social Worker [email or web address] [phone]

## 2020-10-19 NOTE — ED Provider Notes (Signed)
This patient has been seen by psychiatry is medically cleared and cleared by psychiatry.  Patient has outpatient resources for follow-up for behavioral health concerns.  They invited to return at any point.  When I evaluated the patient she was calm cooperative and understanding of the plan.  No signs of increased work of breathing no signs of distress.  Safe for discharge home.   Sabino Donovan, MD 10/19/20 1122

## 2020-10-19 NOTE — BH Assessment (Addendum)
BHH Assessment Progress Note  Per Shuvon Rankin, NP, this voluntary pt does not require psychiatric hospitalization at this time.  Pt is psychiatrically cleared.  Pt has previously scheduled appointments for psychiatry and therapy at [facility], and these have been included in pt's discharge instructions.  EDP Cherlynn Perches, MD, pt's nurse, Florentina Addison, and charge nurse Patty have been notified.  Doylene Canning, MA Triage Specialist [phone]  Addendum:  Per Denice Bors, family will need to be contacted to arrange for pick up.  Reviewing pt's record I found that she lives with her mother, Lindsey Richmond [phone]).  Call was placed at 11:55.  She reports that she will need to contact pt's brother, Lindsey Richmond, to arrange for pick up.  I provided my phone number.  She agrees to have him call as soon as possible with approximate time.  This call is pending as of this writing.  Parties noted above have been updated.  Doylene Canning, [place] Behavioral Health Coordinator [phone]  Addendum:  Lindsey Richmond calls back to report that family will not be able to pick pt up.  Lindsey Richmond, LCSWA agrees to arrange for transportation.  Parties above have been updated.  Doylene Canning, [place] Behavioral Health Coordinator [phone]

## 2020-10-19 NOTE — Consult Note (Addendum)
[facility] Psych ED Discharge  10/19/2020 10:26 AM Lindsey Richmond  MRN:  [id number]   Reason for Consult:  Psychosis Referring Physician:  Lucien Mons EDP Location of Patient: [facility] ED Location of Provider:  Other: [facility]  Principal Problem: Catatonic schizophrenia [facility]) Discharge Diagnoses: Principal Problem:   Catatonic schizophrenia (HCC) Active Problems:   Schizophrenia, paranoid (HCC)   Schizoaffective disorder, bipolar type (HCC)  Patient location: [facility] ED Provider location: [facility]   Subjective: " Do you know when I'm getting transferred?"  Lindsey Richmond, 25 y.o., female patient presented to [facility] ED under IVC with complaints patient being in a catatonic state. Patient seen via tele psych by this provider, consulted with Dr. Lucianne Muss; and chart reviewed on 10/19/20.  On evaluation Lindsey Richmond reports that she is feeling fine this morning.  Patient states that she is eating and sleeping okay.  States that yesterday she was having problems getting her words out but she feel herself and better today.  Patient denies suicidal/self-harm/homicidal ideation, psychosis, and paranoia.  Patient wanted to know when she would be transferred to another hospital but was informed that she did not need inpatient psychiatric treatment.  Patient asked if she could call the nurse in room to get tele health machine; patient stood without difficult went to the door and got nurse to come to room and remove the tele health machine.   During evaluation Lindsey Richmond is sitting up in bed in no acute distress.  She is alert, oriented x 4, calm and cooperative.  Her mood is appropriate and with congruent affect.  She does not appear to be responding to internal/external stimuli or delusional thoughts.  Patient denies suicidal/self-harm/homicidal ideation, psychosis, and paranoia.  Patient answered question appropriately.  Total Time spent with patient: 30 minutes  Past Psychiatric History: See above  Past Medical History:  Past Medical  History:  Diagnosis Date  . Anxiety   . Depression   . Oppositional defiant behavior   . Schizo affective schizophrenia (HCC)   . Schizophrenia (HCC)    History reviewed. No pertinent surgical history. Family History:  Family History  Problem Relation Age of Onset  . Heart failure Mother   . Hypertension Mother   . Diabetes Father    Family Psychiatric  History: Unaware Social History:  Social History   Substance and Sexual Activity  Alcohol Use No     Social History   Substance and Sexual Activity  Drug Use No    Social History   Socioeconomic History  . Marital status: Single    Spouse name: Not on Richmond  . Number of children: Not on Richmond  . Years of education: Not on [place]  . Highest education level: Not on [place]  Occupational History  . Occupation: Unemployed  Tobacco Use  . Smoking status: Never Smoker  . Smokeless tobacco: Never Used  Vaping Use  . Vaping Use: Never used  Substance and Sexual Activity  . Alcohol use: No  . Drug use: No  . Sexual activity: Never    Birth control/protection: None  Other Topics Concern  . Not on Richmond  Social History Narrative   ** Merged History Encounter **       Pt lives with her mother, brother, and father.  She receives outpatient psychiatric care through [facility].   Social Determinants of Health   Financial Resource Strain: Not on Richmond  Food Insecurity: Not on Richmond  Transportation Needs: Not on Richmond  Physical Activity: Not on Richmond  Stress: Not on Richmond  Social Connections: Not on [place]    Has this patient used any form of tobacco in the last 30 days? (Cigarettes, Smokeless Tobacco, Cigars, and/or Pipes) Prescription not provided because: Patietn doesn't use tobacco products  Current Medications: Current Facility-Administered Medications  Medication Dose Route Frequency Provider Last Rate Last Admin  . benztropine (COGENTIN) tablet 1 mg  1 mg Oral BID Nira ConnBerry, Jason A, NP      . fluPHENAZine (PROLIXIN) tablet 10 mg   10 mg Oral BID Nira ConnBerry, Jason A, NP      . fluPHENAZine (PROLIXIN) tablet 15 mg  15 mg Oral QHS Nira ConnBerry, Jason A, NP      . LORazepam (ATIVAN) injection 2 mg  2 mg Intramuscular BID PRN Jackelyn PolingBerry, Jason A, NP       Or  . LORazepam (ATIVAN) tablet 2 mg  2 mg Oral BID PRN Jackelyn PolingBerry, Jason A, NP       Current Outpatient Medications  Medication Sig Dispense Refill  . benztropine (COGENTIN) 1 MG tablet Take 1 tablet (1 mg total) by mouth 2 (two) times daily. For prevention of tremors 60 tablet 0  . fluPHENAZine (PROLIXIN) 10 MG tablet Take 1 tablet (10 mg total) by mouth 2 (two) times daily. For mood control 60 tablet 0  . fluPHENAZine (PROLIXIN) 5 MG tablet Take 3 tablets (15 mg total) by mouth at bedtime. (Patient taking differently: Take 5 mg by mouth at bedtime. Take one 5mg  tablet along w/ one 10mg  tablet, at bedtime) 30 tablet 0  . LORazepam (ATIVAN) 2 MG tablet Take 1 tablet (2 mg total) by mouth 2 (two) times daily. For severe anxiety (Patient taking differently: Take 2 mg by mouth 2 (two) times daily as needed for anxiety.) 10 tablet 0  . PROAIR HFA 108 (90 Base) MCG/ACT inhaler Inhale 2 puffs into the lungs 4 (four) times daily as needed for wheezing or shortness of breath.    . traZODone (DESYREL) 100 MG tablet Take 1 tablet (100 mg total) by mouth at bedtime. For sleep (Patient not taking: Reported on 10/19/2020) 30 tablet 0   PTA Medications: (Not in a hospital admission)   Musculoskeletal: Strength & Muscle Tone: within normal limits Gait & Station: normal Patient leans: N/A  Psychiatric Specialty Exam: Physical Exam Vitals and nursing note reviewed. Exam conducted with a chaperone present.  Constitutional:      General: She is not in acute distress.    Appearance: Normal appearance. She is not ill-appearing.  HENT:     Head: Normocephalic.  Cardiovascular:     Rate and Rhythm: Normal rate.  Pulmonary:     Effort: Pulmonary effort is normal.  Musculoskeletal:        General: Normal  range of motion.     Cervical back: Normal range of motion.  Neurological:     Mental Status: She is alert and oriented to person, place, and time.  Psychiatric:        Attention and Perception: Attention and perception normal. She does not perceive auditory or visual hallucinations.        Mood and Affect: Mood normal. Affect is flat.        Speech: Speech normal.        Behavior: Behavior normal. Behavior is cooperative.        Thought Content: Thought content normal. Thought content is not paranoid or delusional. Thought content does not include homicidal or suicidal ideation.  Cognition and Memory: Cognition and memory normal.        Judgment: Judgment is impulsive.     Review of Systems  Constitutional: Negative.   HENT: Negative.   Eyes: Negative.   Respiratory: Negative.   Cardiovascular: Negative.   Gastrointestinal: Negative.   Genitourinary: Negative.   Musculoskeletal: Negative.   Skin: Negative.   Neurological: Negative.   Hematological: Negative.   Psychiatric/Behavioral: Negative for agitation, behavioral problems, hallucinations, self-injury and suicidal ideas. The patient is not nervous/anxious and is not hyperactive.     Blood pressure (!) 154/110, pulse 99, temperature 98.2 F (36.8 C), temperature source Oral, resp. rate 16, SpO2 99 %.There is no height or weight on Richmond to calculate BMI.  General Appearance: Casual  Eye Contact:  Good  Speech:  Clear and Coherent and Normal Rate  Volume:  Normal  Mood:  Euthymic  Affect:  Flat  Thought Process:  Coherent and Descriptions of Associations: Intact  Orientation:  Full (Time, Place, and Person)  Thought Content:  WDL  Suicidal Thoughts:  No  Homicidal Thoughts:  No  Memory:  Immediate;   Fair Recent;   Fair  Judgement:  Intact  Insight:  Present  Psychomotor Activity:  Normal  Concentration:  Concentration: Fair and Attention Span: Fair  Recall:  [employer] of Knowledge:  Good  Language:  Good   Akathisia:  No  Handed:  Right  AIMS (if indicated):     Assets:  Desire for Improvement Housing Resilience Social Support  ADL's:  Intact  Cognition:  WNL  Sleep:      Demographic Factors:  Black female  Loss Factors: NA  Historical Factors: Impulsivity  Risk Reduction Factors:   Living with another person, especially a relative, Positive social support and Positive therapeutic relationship  Continued Clinical Symptoms:  Schizophrenia:   Paranoid or undifferentiated type  Cognitive Features That Contribute To Risk:  None    Suicide Risk:  Minimal: No identifiable suicidal ideation.  Patients presenting with no risk factors but with morbid ruminations; may be classified as minimal risk based on the severity of the depressive symptoms  Plan Of Care/Follow-up recommendations:  Activity:  As tolerated Diet:  Heart healthy    Discharge Instructions     For your behavioral health needs you are advised to follow up with [facility].  You have the following appointments scheduled:  MEDICATION MANAGEMENT:      Monday, October 25, 2020 - This is a walk-in appointment.  Plan to be there as close to 7:45 am as possible to be seen that day  THERAPY:      Monday, November 01, 2020 at 10:00 am with Marybelle Killings, LCSW       [facility]      [address]      [place], [place] [address]      [phone]      Disposition:  Patient psychiatrically cleared No evidence of imminent risk to self or others at present.   Patient does not meet criteria for psychiatric inpatient admission. Supportive therapy provided about ongoing stressors. Discussed crisis plan, support from social network, calling 911, coming to the Emergency Department, and calling Suicide Hotline.   This service was provided via telemedicine using a 2-way, interactive audio and video technology.  Names of all persons participating in this telemedicine service  and their role in this encounter. Name: Assunta Found Role: NP  Name: Dr. Nelly Rout Role: Psychiatrist  Name:  Lindsey Richmond Role: Patient  Name:  Role:     Secure message sent to Dr. Myrtis Ser informing that patient has been psychiatrically cleared and family will need to be contacted to pick her up  Lindsey Solar, NP 10/19/2020, 10:25 AM

## 2020-10-19 NOTE — ED Notes (Addendum)
Pt's belongings placed in one bag at the nurse's station in triage.

## 2020-10-19 NOTE — Progress Notes (Signed)
TOC CM/CSW contacted transportation for pt.  Pt has signed [employer]'s Waiver and it was faxed to [employer].    CSW will continue to follow for dc needs.  Iley Breeden Tarpley-Carter, MSW, LCSW-A Pronouns:  She, Her, Hers                  [facility] Transitions of CareClinical Social Worker [email or web address] [phone]

## 2020-10-19 NOTE — Progress Notes (Signed)
..   Transition of Care [facility]) - Emergency Department Mini Assessment   Patient Details  Name: Lindsey Richmond MRN: [id number] Date of Birth: 1996/03/12  Transition of Care [facility]) CM/SW Contact:    Ariam Mol C Tarpley-Carter, LCSWA Phone Number: 10/19/2020, 1:30 PM   Clinical Narrative: [facility] CM/CSW spoke with nurse at pts bed side.  Pt is ready for dc and needs a ride home.  Rider's Waiver was signed by pt.  Orlin Kann Tarpley-Carter, MSW, LCSW-A Pronouns:  She, Her, Hers                  [facility] Transitions of CareClinical Social Worker [email or web address] [phone]   ED Mini Assessment: What brought you to the Emergency Department? : Psychiatric Eval  Barriers to Discharge: No Barriers Identified     Means of departure: Ambulance  Interventions which prevented an admission or readmission: Transportation Screening    Patient Contact and Communications Key Contact 1: Heide Scales   Spoke with: [name] Date: 10/19/20,     Contact Phone Number: [phone] Call outcome: [name] was able to verify pts address.  Patient states their goals for this hospitalization and ongoing recovery are:: To obtain transportation home.   Choice offered to / list presented to : Patient,Parent  Admission diagnosis:  psych eval Patient Active Problem List   Diagnosis Date Noted  . Catatonic schizophrenia (HCC) 10/19/2020  . Catatonic state   . Schizoaffective disorder, bipolar type (HCC)   . Schizo-affective schizophrenia (HCC) 12/25/2019  . Schizophrenia (HCC) 12/25/2019  . Schizophrenia (HCC) 01/15/2019  . Schizophrenia, paranoid (HCC) 04/20/2017   PCP:  Patient, No Pcp Per Pharmacy:   [facility] [address] [place] - [address] [place] [address] Phone: [phone] Fax: [phone]

## 2020-10-19 NOTE — Discharge Instructions (Signed)
For your behavioral health needs you are advised to follow up with [facility].  You have the following appointments scheduled:  MEDICATION MANAGEMENT:      Monday, October 25, 2020 - This is a walk-in appointment.  Plan to be there as close to 7:45 am as possible to be seen that day  THERAPY:      Monday, November 01, 2020 at 10:00 am with Marybelle Killings, LCSW       [facility]      [address]      [place], [place] [address]      [phone]

## 2020-10-21 ENCOUNTER — Other Ambulatory Visit (HOSPITAL_COMMUNITY): Payer: Self-pay | Admitting: Psychiatry

## 2020-10-28 ENCOUNTER — Other Ambulatory Visit: Payer: Self-pay

## 2020-10-28 ENCOUNTER — Encounter: Payer: Medicare Other | Attending: Internal Medicine | Admitting: Registered"

## 2020-10-28 ENCOUNTER — Encounter: Payer: Self-pay | Admitting: Registered"

## 2020-10-28 DIAGNOSIS — Z713 Dietary counseling and surveillance: Secondary | ICD-10-CM | POA: Insufficient documentation

## 2020-10-28 NOTE — Patient Instructions (Signed)
-   Try to use peanut butter as source of protein.   - Aim to have dinner each night.   - Aim to increase activity with walking at least 2 days/week, 60 min.

## 2020-10-28 NOTE — Progress Notes (Signed)
  Medical Nutrition Therapy  Appointment Start time:  9:55  Appointment End time:  10:53  Primary concerns today: wants to lose weight  Referral diagnosis: overweight Preferred learning style:  no preference indicated Learning readiness: contemplating  NUTRITION ASSESSMENT   States back in high school she was weighing 134 lbs but has grown into 270 lbs. States she wasn't taking medication in high school. States her parents are concerned about her weight gain. Pt states she had an increased appetite since taking medications. States she is having cravings. States she will get up rarely to get up during the night to eat due to hunger. State brother threw away her Popeye's meal one day. States yesterday she had McDonald's and ate it fast in order to complete the meal before brother came home for fear of him throwing her food away again. States she felt antagonized. Reports she feels her weight is a problem.   Reports challenges with food security. States they used to go to pantries but has not been in a while. States she used to buy food but it would be eaten fast.   Lives with mom, dad, and older brother. States dad and brother are working.     Clinical Medical Hx: catatonic schizophrenia; schizoaffective disorder, bipolar type Medications: See list Labs: low HDL (31); WNL values-Chol (155), A1c (5.4), Vitamin D (33) Notable Signs/Symptoms: none  Lifestyle & Dietary Hx  Estimated daily fluid intake: 52 oz Supplements: none Sleep: sleeps 13 hrs/night Stress / self-care: none Current average weekly physical activity: none  24-Hr Dietary Recall First Meal: Bratworth's or 2 cups of frozen mixed vegtables Snack:  Second Meal: Manwich sandwich or McDonald's-2 McChicken sandwiches + 10 nuggets + mango smoothie Snack:  Third Meal: typically skips Snack:  Beverages: mango smoothie (16 oz), lemonade (2*12 oz; 24 oz), water (1*12 oz; 12 oz); 52+ oz   NUTRITION DIAGNOSIS  NB-1.5 Disordered  eating pattern As related to skipping meals.  As evidenced by dietary recall.   NUTRITION INTERVENTION   Nutrition education (E-1) on the following topics: Nutrition education and counseling. Pt was educated on the benefits of eating a variety of food groups at each meal. Discussed the purpose of each food group and ways to create balance with already established regimen. Discussed food being a necessity and prioritizing it over weight changes. Discussed recent changes. Discussed role and benefits of physical activity. Pt was in agreement with goals listed.   Handouts Provided Include   none  Learning Style & Readiness for Change Teaching method utilized: Visual & Auditory  Demonstrated degree of understanding via: Teach Back  Barriers to learning/adherence to lifestyle change: contemplative stage of change  Goals Established by Pt  Try to use peanut butter as source of protein.   Aim to have dinner each night.   Aim to increase activity with walking at least 2 days/week, 60 min.    MONITORING & EVALUATION Dietary intake and weekly physical activity.   Next Steps  Patient is to follow-up in 4 weeks.

## 2020-11-01 ENCOUNTER — Ambulatory Visit (HOSPITAL_COMMUNITY): Payer: Medicare Other | Admitting: Licensed Clinical Social Worker

## 2020-11-25 ENCOUNTER — Encounter: Payer: Self-pay | Admitting: Registered"

## 2020-11-25 ENCOUNTER — Encounter: Payer: Medicare Other | Attending: Internal Medicine | Admitting: Registered"

## 2020-11-25 ENCOUNTER — Other Ambulatory Visit: Payer: Self-pay

## 2020-11-25 DIAGNOSIS — Z713 Dietary counseling and surveillance: Secondary | ICD-10-CM | POA: Diagnosis not present

## 2020-11-25 NOTE — Progress Notes (Signed)
  Medical Nutrition Therapy  Appointment Start time: 10:01  Appointment End time:  10:31  Primary concerns today: wants to lose weight  Referral diagnosis: overweight Preferred learning style: no preference indicated Learning readiness: contemplating  NUTRITION ASSESSMENT   States she went to the doctor and needs to lose 5 lbs because she is obese. States her microwave broke and they have been heating food on the stove. States she didn't use the microwave that often prior to. States she has been eating rice. States she is thinking about buying romaine lettuce. States she eats as much as she can during the day because she doesn't eat much at night; goes to bed around 6 pm. States they are running out of food quickly at home. Does not have a lot of food in the home currently. States she has lost 13 lbs. States she is trying to get to a healthy weight. States they need to go to the pantry but have transportation challenges. Pt asks if we have snacks in our office to give. States she feels hungry and out of it.   Lives with mom, dad, and older brother. States dad and brother are working.     Clinical Medical Hx: catatonic schizophrenia; schizoaffective disorder, bipolar type Medications: See list Labs: low HDL (31); WNL values-Chol (155), A1c (5.4), Vitamin D (33) Notable Signs/Symptoms: none  Lifestyle & Dietary Hx  Estimated daily fluid intake: 52 oz Supplements: none Sleep: sleeps 13 hrs/night Stress / self-care: none Current average weekly physical activity: walking 30-60 min, 3 days/week; some jumproping.   24-Hr Dietary Recall First Meal: 1/4 kielbasa sausage  Snack:  Second Meal: 1/4 kielbasa sausage + 3/4 plate rice + 2 c  Stew + sparkling water + 2 c whole milk Snack:  Third Meal: typically skips Snack:  Beverages: mango smoothie (16 oz), lemonade (2*12 oz; 24 oz), water (1*12 oz; 12 oz); 52+ oz   NUTRITION DIAGNOSIS  NB-1.5 Disordered eating pattern As related to  skipping meals.  As evidenced by dietary recall.   NUTRITION INTERVENTION   Nutrition education (E-1) on the following topics: Nutrition education and counseling. Pt was educated on the benefits of eating a variety of food groups at each meal. Discussed ways to create variety with limited resources. Encouraged pt with increased physical activity and doing what she can with what she has. Discussed ways having conversations with parents about utilizing local pantries. Provided pt with a snack to help ease hunger. Pt was in agreement with goals listed.   Handouts Provided Include   none  Learning Style & Readiness for Change Teaching method utilized: Visual & Auditory  Demonstrated degree of understanding via: Teach Back  Barriers to learning/adherence to lifestyle change: contemplative stage of change  Goals Established by Pt  Add fruit to breakfast or have more than 1 food group with breakfast.   Great job increasing movement!   Increase walking to 4-5 days/week.   MONITORING & EVALUATION Dietary intake and weekly physical activity.   Next Steps  Patient is to follow-up in 4 weeks.

## 2020-11-25 NOTE — Patient Instructions (Signed)
-   Add fruit to breakfast or have more than 1 food group with breakfast.   -  Great job increasing movement!   - Increase walking to 4-5 days/week.

## 2020-12-23 ENCOUNTER — Other Ambulatory Visit: Payer: Self-pay

## 2020-12-23 ENCOUNTER — Encounter: Payer: Self-pay | Admitting: Registered"

## 2020-12-23 ENCOUNTER — Encounter: Payer: Medicare Other | Attending: Internal Medicine | Admitting: Registered"

## 2020-12-23 DIAGNOSIS — Z713 Dietary counseling and surveillance: Secondary | ICD-10-CM | POA: Insufficient documentation

## 2020-12-23 NOTE — Progress Notes (Signed)
  Medical Nutrition Therapy  Appointment Start time: 10:07  Appointment End time:  10:45  Primary concerns today: wants to lose weight  Referral diagnosis: overweight Preferred learning style: no preference indicated Learning readiness: contemplating  NUTRITION ASSESSMENT   Pt states she currently weighs 277 lbs. States she weighs herself at home. States she is still seeing a therapist. States she walks in the garage for 60 min while listening to rock music for physical activity. States she is walking about 3 times/week.   States she is an introvert. Does not like to travel in referencing her family traveling to [place] later this year. 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   States she used to cook zucchini noodles + general tso's chicken but has not been cooking lately. States she has 4 bags of 60 oz vegetables at home and she likes all vegetables. States she wants to eat them often. Reports she recently bought beef jerky in bulk from [employer]. Reports she orders food via food delivery services.   Lives with mom, dad, and older brother. States dad and brother are working.     Clinical Medical Hx: catatonic schizophrenia; schizoaffective disorder, bipolar type Medications: See list Labs: low HDL (31); WNL values-Chol (155), A1c (5.4), Vitamin D (33) Notable Signs/Symptoms: none  Lifestyle & Dietary Hx  Estimated daily fluid intake: 72 oz Supplements: none Sleep: sleeps 13 hrs/night Stress / self-care: none Current average weekly physical activity: walking 60 min, 3 days/week; some jump roping.   24-Hr Dietary Recall First Meal (11 am): [employer] chicken - med french fries + sparkling water + Cheerwine Snack (12 pm): 3 french toast sticks + water Second Meal (1-2 pm): 4 pieces of beef  Snack:  Third Meal (4 pm): apple + 1/2 PBJ sandwich  Snack:  Beverages: Cheerwine (20 oz) water (52 oz); ~72 oz   NUTRITION DIAGNOSIS  NB-1.5 Disordered eating pattern As related to  skipping meals.  As evidenced by dietary recall.   NUTRITION INTERVENTION   Nutrition education (E-1) on the following topics: Nutrition education and counseling. Pt was encouraged with eating more often throughout the day to adequately nourish her body, increasing fluid intake, and increasing physical activity. Discussed ways to increase vegetable intake. Pt was in agreement with goals listed.   Handouts Provided Include   none  Learning Style & Readiness for Change Teaching method utilized: Visual & Auditory  Demonstrated degree of understanding via: Teach Back  Barriers to learning/adherence to lifestyle change: contemplative stage of change  Goals Established by Pt  Aim for 2 food groups with breakfast.  Have vegetables at least once a day.   Continue to walk at least 3 times a week. Great job with this!  Great job increasing fluid intake as well!   MONITORING & EVALUATION Dietary intake and weekly physical activity.   Next Steps  Patient is to follow-up in 4 weeks.

## 2020-12-23 NOTE — Patient Instructions (Signed)
-   Aim for 2 food groups with breakfast.  - Have vegetables at least once a day.   - Continue to walk at least 3 times a week. Great job with this!  - Great job increasing fluid intake as well!

## 2021-02-22 ENCOUNTER — Encounter: Payer: Medicare Other | Attending: Internal Medicine | Admitting: Registered"

## 2021-02-22 ENCOUNTER — Other Ambulatory Visit: Payer: Self-pay

## 2021-02-22 DIAGNOSIS — Z713 Dietary counseling and surveillance: Secondary | ICD-10-CM | POA: Insufficient documentation

## 2021-02-22 NOTE — Progress Notes (Signed)
  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 Reports she got a new car and a new job. States she will be working Mon-Fri, 11:30 - 7:30 am; starting in June.   States she has been going to the gym 2-3 days/week. States she walks for 60 min + weight lifting. States she has been eating 3 meals a day lately.   Previous appt: Pt states she currently weighs 277 lbs. States she weighs herself at home. States she is still seeing a therapist. States she likes all vegetables.   Lives with mom, dad, and older brother. States dad and brother are working.     Clinical Medical Hx: catatonic schizophrenia; schizoaffective disorder, bipolar type Medications: See list Labs: low HDL (31); WNL values-Chol (155), A1c (5.4), Vitamin D (33) Notable Signs/Symptoms: none  Lifestyle & Dietary Hx  Estimated daily fluid intake: 72 oz Supplements: none Sleep: sleeps 13 hrs/night Stress / self-care: none Current average weekly physical activity: walking 60 min, 3 days/week; some jump roping.   24-Hr Dietary Recall First Meal (11 am): chicken sandwich + baked potato + fruit infused water or mac and cheese  Snack (12 pm): chicken sandwich + lemonade Second Meal (1-2 pm):   Snack: Ramen noodles  Third Meal (4 pm): [employer]-2 slices of thin crust supreme pizza + 4 wings  Snack:  Beverages: lemonade (64 oz); 64 oz   NUTRITION DIAGNOSIS  NB-1.5 Disordered eating pattern As related to skipping meals.  As evidenced by dietary recall.   NUTRITION INTERVENTION   Nutrition education (E-1) on the following topics: Nutrition education and counseling. Pt was encouraged with eating more often throughout the day to adequately  nourish her body and being physically active. Discussed importance and ways to increase vegetable intake. Pt agreed with goals listed.   Handouts Provided Include   none  Learning Style & Readiness for Change Teaching method utilized: Visual & Auditory  Demonstrated degree of understanding via: Teach Back  Barriers to learning/adherence to lifestyle change: contemplative stage of change  Goals Established by Pt  Have vegetables at least once a day.   Continue to be active at least 3 times a week.   Have at least 64 oz of water daily.    MONITORING & EVALUATION Dietary intake and weekly physical activity.   Next Steps  Patient is to follow-up in 3 months.

## 2021-02-22 NOTE — Patient Instructions (Addendum)
   Have vegetables at least once a day.    Continue to be active at least 3 times a week.    Have at least 64 oz of water daily.

## 2021-05-25 ENCOUNTER — Encounter: Payer: Medicare Other | Attending: Internal Medicine | Admitting: Registered"

## 2021-05-25 DIAGNOSIS — Z713 Dietary counseling and surveillance: Secondary | ICD-10-CM | POA: Insufficient documentation

## 2021-12-03 ENCOUNTER — Ambulatory Visit (HOSPITAL_COMMUNITY)
Admission: EM | Admit: 2021-12-03 | Discharge: 2021-12-03 | Disposition: A | Discharge status: Home / Self Care | Payer: Medicare Other | Attending: Behavioral Health | Admitting: Behavioral Health

## 2021-12-03 DIAGNOSIS — F200 Paranoid schizophrenia: Secondary | ICD-10-CM | POA: Diagnosis not present

## 2021-12-03 DIAGNOSIS — F209 Schizophrenia, unspecified: Secondary | ICD-10-CM

## 2021-12-03 NOTE — ED Triage Notes (Signed)
Pt presents to [facility] escorted by [employer]. Pt states that " my parents say I am hallucinating but I am not, I am just spreading the love of jesus". Pt is smiling and pleasant during triage process. Pt states that she is taking anti-psychotic medication but she cannot recall the names at the moment. Pt denies being diagnosed with anything but upon review of the pts chart she has a hx of catatonic schizophrenia. Pt denies SI/HI and AVH at this time. ?

## 2021-12-03 NOTE — ED Provider Notes (Signed)
Behavioral Health Urgent Care Medical Screening Exam ? ?Patient Name: Lindsey Richmond ?MRN: [id number] ?Date of Evaluation: 12/03/21 ?Diagnosis:  ?Final diagnoses:  ?Schizophrenia, unspecified type (HCC)  ? ? ?History of Present illness: Odessie Polzin is a 26 y.o. female patient who presents to the [facility] behavioral health urgent care voluntary accompanied by [employer] with a chief complaint "my parents say I am hallucinating but I am not. I am spreading the love of Jesus." ? ?Patient seen and evaluated face-to-face by this provider along with the TTS counselor and chart reviewed.  Patient has a past psychiatric history significant for Catatonic schizophrenia and Schizophrenia, paranoid.  ? ?On evaluation, patient is alert and oriented x4. Her thought process is logical and speech is clear and coherent at a decreased tone. Her mood is euthymic and affect is congruent. She is calm and cooperative. Patient is casually dressed. ? ?Patient states, she was professing [employer]. She states that she has been doing this for years. She states that her mother was concerned because she didn't see her at home this morning and that she had walked to the [facility]. She states that she was talking to people about [employer]. She states, "I just hope, the end of the world is happening and life is all you have left."   ? ?Patient denies suicidal and homicidal ideations. Patient denies AVH. She states that Jesus is talking to her spiritually. There is no evidence that the patient is currently responding to internal or external stimuli or exhibiting paranoia thought content. Patient denies depressive symptoms. Patient reports a good appetite. Patient reports fair sleep. ? ?Patient denies drinking alcohol or using illicit drugs. Patient states that she resides with her mother, father, and brother. Patient states that she takes medication but is unable to name the medications that she is prescribed.  Patient states that she gets her  medications from [place]. Patient is unable to name the doctor/psychiatrist who prescribes her medications.  ? ?Per chart review patient was hospitalized at [facility] on 09/28/2020-10/06/20 for medication noncompliance and catatonia. Patient was prescribed Benztropine 1 mg po BID for EPS/tremors, Fluphenazine 10 mg po BID and 15 mg QHS, ativan 2 mg po BID for serve anxiety and trazodone 100 mg po QHS.  Patient was recommended to follow up with the [facility] for medication management. No record of the patient following up at the [facility] Outpatient.  ? ?Patient appears hyper-religious but does not show any signs of being psychotic. Patient denies SI/HI/AVH. Patient does not meet [place] criteria for involuntary commitment. Patient recommended to follow up with outpatient services at the [facility] for medication management.   ? ?Per nursing, mother is unable to pick patient up. Patient's mother reports that patient does not like to take her medications. No safety concerned reported. Will transport patient home via taxi.  ? ?Psychiatric Specialty Exam ? ?Presentation  ?General Appearance:Appropriate for Environment ? ?Eye Contact:Fair ? ?Speech:Clear and Coherent ? ?Speech Volume:Normal ? ?Handedness:No data recorded ? ?Mood and Affect  ?Mood:Euthymic ? ?Affect:Congruent ? ? ?Thought Process  ?Thought Processes:Coherent ? ?Descriptions of Associations:Intact ? ?Orientation:Full (Time, Place and Person) ? ?Thought Content:Logical ? Diagnosis of Schizophrenia or Schizoaffective disorder in past: No data recorded Duration of Psychotic Symptoms: No data recorded Hallucinations:None ? ?Ideas of Reference:None ? ?Suicidal Thoughts:No ? ?Homicidal Thoughts:No ? ? ?Sensorium  ?Memory:Immediate Fair; Remote Fair; Recent Fair ? ?Judgment:Fair ? ?Insight:Fair ? ? ?Executive Functions  ?Concentration:Fair ? ?Attention Span:Fair ? ?Recall:Fair ? ?Fund of Knowledge:Fair ? ?  Language:Fair ? ? ?Psychomotor Activity   ?Psychomotor Activity:Normal ? ? ?Assets  ?Assets:Communication Skills; Desire for Improvement; Financial Resources/Insurance; Housing; Leisure Time; Physical Health; Social Support; Transportation ? ? ?Sleep  ?Sleep:Fair ? ?Number of hours: 6 ? ?Physical Exam: ?Physical Exam ?Constitutional:   ?   Appearance: Normal appearance.  ?HENT:  ?   Head: Normocephalic and atraumatic.  ?   Nose: Nose normal.  ?Eyes:  ?   Conjunctiva/sclera: Conjunctivae normal.  ?Cardiovascular:  ?   Rate and Rhythm: Normal rate.  ?Pulmonary:  ?   Effort: Pulmonary effort is normal.  ?Musculoskeletal:     ?   General: Normal range of motion.  ?   Cervical back: Normal range of motion.  ?Neurological:  ?   Mental Status: She is alert and oriented to person, place, and time.  ? ?Review of Systems  ?Constitutional: Negative.   ?HENT: Negative.    ?Eyes: Negative.   ?Respiratory: Negative.    ?Cardiovascular: Negative.   ?Gastrointestinal: Negative.   ?Genitourinary: Negative.   ?Musculoskeletal: Negative.   ?Skin: Negative.   ?Neurological: Negative.   ?Endo/Heme/Allergies: Negative.   ?Blood pressure (!) 139/92, pulse 91, temperature 98.5 ?F (36.9 ?C), temperature source Oral, resp. rate 18, SpO2 99 %. There is no height or weight on file to calculate BMI. ? ?Musculoskeletal: ?Strength & Muscle Tone: within normal limits ?Gait & Station: normal ?Patient leans: N/A ? ? ?[facility] MSE Discharge Disposition for Follow up and Recommendations: ?Based on my evaluation the patient does not appear to have an emergency medical condition and can be discharged with resources and follow up care in outpatient services for Medication Management ? ? ?Layla Barter, NP ?12/03/2021, 10:54 AM ? ?

## 2021-12-03 NOTE — Discharge Instructions (Signed)

## 2021-12-03 NOTE — BH Assessment (Signed)
Comprehensive Clinical Assessment (CCA) Note  12/03/2021 Lindsey Richmond [id number]  Disposition: Per Liborio Nixon, NP patient does not meet inpatient criteria.  It is recommended that patient follow up with current provider for medication management.    The patient demonstrates the following risk factors for suicide: Chronic risk factors for suicide include: psychiatric disorder of Schizophrenia, paranoid type . Acute risk factors for suicide include:  denies stressors and no acute risk factors . Protective factors for this patient include: positive social support, positive therapeutic relationship, responsibility to others (children, family), and hope for the future. Considering these factors, the overall suicide risk at this point appears to be low. Patient is appropriate for outpatient follow up.  Patient is a 26 year old female with a history of Schizophrenia, paranoid type who presents voluntarily via [employer] to Behavioral Health Urgent Care for assessment.  Patient states, "My parents say I am hallucinating but I am not, I am just spreading the love of Jesus". Patient is pleasant, engaging and AAOx5 upon assessment.  She states she left home this morning, to go the [place] center to "profess Jesus" and did not let her mother know she was leaving.  Mother called [employer] to report her missing and patient was agreeable with coming in for assessment.  Patient states that she is compliant with medications, however she cannot recall the names at the moment. Patient is hyper-religious, as she discusses her mission to "spread the Word" for the past two years.  She states she hears from [place], however denies hearing an audible voice; describing his voice as more spiritual.  Patient has a history of inpatient admissions for catatonic episodes in the past, with most recent admission to [facility] from 09/29/19 - 10/07/19.  Currently, patient is denying SI, HI, AVH or Sa hx.  No other concerns noted, outside of feeling hungry and  requesting food, "real food."    Chief Complaint:  Chief Complaint  Patient presents with   Hallucinations   Visit Diagnosis: Schizophrenia, paranoid type  Flowsheet Row ED from 12/03/2021 in [facility]  Thoughts that you would be better off dead, or of hurting yourself in some way Not at all  PHQ-9 Total Score 0      Flowsheet Row [facility]) [facility]/2020 [facility] Ad[facility] (Discharged[facility]  C-SSRS RISK CATEGORY No Risk No Risk No Risk       CCA Screening, Triage and Referral (STR)  Patient Reported Information How did you hear about [place]? Legal System  What Is the Reason for Your Visit/Call Today? Pt presents to [facility] escorted by [employer]. Pt states that " my parents say I am hallucinating but I am not, I am just spreading the love of jesus". Pt is smiling and pleasant during triage process. Pt states that she is taking anti-psychotic medication but she cannot recall the names at the moment. Pt denies SI/HI and AVH at this time.  How Long Has This Been Causing You Problems? <Week  What Do You Feel Would Help You the Most Today? Treatment for Depression or other mood problem; Medication(s)   Have You Recently Had Any Thoughts About Hurting Yourself? No  Are You Planning to Commit Suicide/Harm Yourself At This time? No   Have you Recently Had Thoughts About Hurting Someone [name]? No  Are You Planning to Harm Someone at This Time? No  Explanation: No data recorded  Have You Used Any Alcohol or Drugs in the Past 24 Hours? No  How Long Ago Did You Use Drugs or Alcohol? No data recorded What Did You Use and How Much? No data recorded  Do You Currently Have a Therapist/Psychiatrist? Yes  Name of Therapist/Psychiatrist: Unclear if patient is med compliant based on chart  review, unable to reach mother.   Have You Been Recently Discharged From Any Office Practice or Programs? No  Explanation of Discharge From Practice/Program: No data recorded    CCA Screening Triage Referral Assessment Type of Contact: Face-to-Face  Telemedicine Service Delivery:   Is this Initial or Reassessment? No data recorded Date Telepsych consult ordered in CH[facility]:  No data recorded Time Telepsych consult ordered in CHL:  No data recorded [facility]  [facility]   Collateral Involvement: Attempted to call mother   Does Patient Have a Automotive engineerCourt Appointed Legal Guardian? No data recorded Name and Contact of Legal Guardian: No data recorded If Minor and Not Living with Parent(s), Who has Custody? No data recorded Is CPS involved or ever been involved? Never  Is APS involved or ever been involved? Never   Patient Determined To Be At Risk for Harm To Self or Others Based on Review of Patient Reported Information or Presenting Complaint? No  Method: No data recorded Availability of Means: No data recorded Intent: No data recorded Notification Required: No data recorded Additional Information for Danger to Others Potential: No data recorded Additional Comments for Danger to Others Potential: No data recorded Are There Guns or Other Weapons in Your Home? No data recorded Types of Guns/Weapons: No data recorded Are These Weapons Safely Secured?                            No data recorded Who Could Verify You Are Able To Have These Secured: No data recorded Do You Have any Outstanding Charges, Pending Court Dates, Parole/Probation? No data recorded Contacted To Inform of Risk of Harm To Self or Others: No data recorded   Does Patient Present under Involuntary Commitment? No  IVC Papers Initial File Date: No data recorded  [place] data recorded  Patient Currently Receiving the Following  Services: Medication Management   Determination of Need: Urgent (48 hours)   Options For Referral: Medication Management; Outpatient Therapy     CCA Biopsychosocial Patient Reported Schizophrenia/Schizoaffective Diagnosis in Past: Yes   Strengths: Reports she is medication compliant, pleasant, has support   Mental Health Symptoms Depression:   None (UTA)   Duration of Depressive symptoms:  Duration of Depressive Symptoms: N/A   Mania:   None (UTA)   Anxiety:    None (UTA)   Psychosis:   Delusions   Duration of Psychotic symptoms:  Duration of Psychotic Symptoms: Greater than six months   Trauma:   -- (UTA)   Obsessions:   None (UTA)   Compulsions:   None (UTA)   Inattention:   N/A (UTA)   Hyperactivity/Impulsivity:   N/A (UTA)   Oppositional/Defiant Behaviors:   N/A (UTA)   Emotional Irregularity:   None (UTA)   Other Mood/Personality Symptoms:   Hyper religious - seems to be baseline for pt    Mental Status Exam Appearance and self-care  Stature:   Average   Weight:   Obese   Clothing:   Disheveled   Grooming:  Neglected   Cosmetic use:   None   Posture/gait:   Normal   Motor activity:   Not Remarkable   Sensorium  Attention:   Normal   Concentration:   Normal   Orientation:   X5 (UTA)   Recall/memory:   Normal (UTA)   Affect and Mood  Affect:   Congruent   Mood:   Euthymic   Relating  Eye contact:   Normal   Facial expression:   Responsive   Attitude toward examiner:   Cooperative   Thought and Language  Speech flow:  Blocked   Thought content:   Appropriate to Mood and Circumstances (UTA)   Preoccupation:   None (UTA)   Hallucinations:   None   Organization:  No data recorded  [employer] of Knowledge:   Poor; Average   Intelligence:   Average   Abstraction:   Abstract   Judgement:   Fair   Reality Testing:   Distorted   Insight:   Gaps   Decision  Making:   Normal; Vacilates; Impulsive   Social Functioning  Social Maturity:   Irresponsible   Social Judgement:   Naive (UTA)   Stress  Stressors:   -- (Denies current stressors)   Coping Ability:   Normal (UTA)   Skill Deficits:   Communication; Interpersonal; Self-control (UTA)   Supports:   Family     Religion: Religion/Spirituality Are You A Religious Person?: Yes What is Your Religious Affiliation?:  (Unknown) How Might This Affect Treatment?: Hyper religious - discussing recently "spreading the word and spreading the love of Jesus."  Leisure/Recreation: Leisure / Recreation Do You Have Hobbies?: No  Exercise/Diet: Exercise/Diet Do You Exercise?: No Have You Gained or Lost A Significant Amount of Weight in the Past Six Months?: No Do You Follow a Special Diet?: No Do You Have Any Trouble Sleeping?: No   CCA Employment/Education Employment/Work Situation: Employment / Work Systems developer: On disability Why is Patient on Disability: Mental Health How Long has Patient Been on Disability: Unknown Patient's Job has Been Impacted by Current Illness: No Has Patient ever Been in the [employer]?: No  Education: Education Is Patient Currently Attending School?: No Last Grade Completed:  (NA) Did You Attend College?: No (UTA) Did You Have An Individualized Education Program (IIEP): No (UTA) Did You Have Any Difficulty At School?: No (UTA) Patient's Education Has Been Impacted by Current Illness: No   CCA Family/Childhood History Family and Relationship History: Family history Marital status: Single Does patient have children?: No  Childhood History:  Childhood History By whom was/is the patient raised?: Both parents Did patient suffer any verbal/emotional/physical/sexual abuse as a child?: No Did patient suffer from severe childhood neglect?: No Has patient ever been sexually abused/assaulted/raped as an adolescent or adult?: No Was  the patient ever a victim of a crime or a disaster?: No Witnessed domestic violence?: No Has patient been affected by domestic violence as an adult?: No  Child/Adolescent Assessment:     CCA Substance Use Alcohol/Drug Use: Alcohol / Drug Use Pain Medications: see MAR Prescriptions: see MAR Over the Counter: see MAR History of alcohol / drug use?: No history of alcohol / drug abuse Longest period of sobriety (when/how long): none Withdrawal Symptoms:  (none)    ASAM's:  Six Dimensions of Multidimensional Assessment  Dimension 1:  Acute Intoxication and/or Withdrawal Potential:      Dimension 2:  Biomedical Conditions and Complications:      Dimension 3:  Emotional,  Behavioral, or Cognitive Conditions and Complications:     Dimension 4:  Readiness to Change:     Dimension 5:  Relapse, Continued use, or Continued Problem Potential:     Dimension 6:  Recovery/Living Environment:     ASAM Severity Score:    ASAM Recommended Level of Treatment:     Substance use Disorder (SUD)    Recommendations for Services/Supports/Treatments:    Discharge Disposition:    DSM5 Diagnoses: Patient Active Problem List   Diagnosis Date Noted   Catatonic schizophrenia (HCC) 10/19/2020   Catatonic state    Schizoaffective disorder, bipolar type (HCC)    Schizo-affective schizophrenia (HCC) 12/25/2019   Schizophrenia (HCC) 12/25/2019   Schizophrenia (HCC) 01/15/2019   Schizophrenia, paranoid (HCC) 04/20/2017    Referrals to Alternative Service(s):  Yetta Glassman, [facility]

## 2021-12-19 ENCOUNTER — Emergency Department (HOSPITAL_COMMUNITY)
Admission: EM | Admit: 2021-12-19 | Discharge: 2021-12-20 | Disposition: A | Discharge status: Home / Self Care | Payer: Medicare Other | Attending: Emergency Medicine | Admitting: Emergency Medicine

## 2021-12-19 ENCOUNTER — Encounter (HOSPITAL_COMMUNITY): Payer: Self-pay | Admitting: Emergency Medicine

## 2021-12-19 ENCOUNTER — Other Ambulatory Visit: Payer: Self-pay

## 2021-12-19 DIAGNOSIS — F989 Unspecified behavioral and emotional disorders with onset usually occurring in childhood and adolescence: Secondary | ICD-10-CM | POA: Diagnosis present

## 2021-12-19 DIAGNOSIS — R4689 Other symptoms and signs involving appearance and behavior: Secondary | ICD-10-CM

## 2021-12-19 DIAGNOSIS — Y900 Blood alcohol level of less than 20 mg/100 ml: Secondary | ICD-10-CM | POA: Insufficient documentation

## 2021-12-19 DIAGNOSIS — Z79899 Other long term (current) drug therapy: Secondary | ICD-10-CM | POA: Insufficient documentation

## 2021-12-19 DIAGNOSIS — F200 Paranoid schizophrenia: Secondary | ICD-10-CM | POA: Diagnosis not present

## 2021-12-19 LAB — DIFFERENTIAL
Abs Immature Granulocytes: 0.03 10*3/uL (ref 0.00–0.07)
Basophils Absolute: 0 10*3/uL (ref 0.0–0.1)
Basophils Relative: 0 %
Eosinophils Absolute: 0.2 10*3/uL (ref 0.0–0.5)
Eosinophils Relative: 2 %
Immature Granulocytes: 0 %
Lymphocytes Relative: 28 %
Lymphs Abs: 2.6 10*3/uL (ref 0.7–4.0)
Monocytes Absolute: 0.7 10*3/uL (ref 0.1–1.0)
Monocytes Relative: 8 %
Neutro Abs: 5.7 10*3/uL (ref 1.7–7.7)
Neutrophils Relative %: 62 %

## 2021-12-19 LAB — COMPREHENSIVE METABOLIC PANEL
ALT: 39 U/L (ref 0–44)
AST: 34 U/L (ref 15–41)
Albumin: 4 g/dL (ref 3.5–5.0)
Alkaline Phosphatase: 72 U/L (ref 38–126)
Anion gap: 10 (ref 5–15)
BUN: 13 mg/dL (ref 6–20)
CO2: 28 mmol/L (ref 22–32)
Calcium: 9.7 mg/dL (ref 8.9–10.3)
Chloride: 98 mmol/L (ref 98–111)
Creatinine, Ser: 1.03 mg/dL — ABNORMAL HIGH (ref 0.44–1.00)
GFR, Estimated: >60 mL/min (ref >60)
Glucose, Bld: 172 mg/dL — ABNORMAL HIGH (ref 70–99)
Potassium: 3.5 mmol/L (ref 3.5–5.1)
Sodium: 136 mmol/L (ref 135–145)
Total Bilirubin: 1.1 mg/dL (ref 0.3–1.2)
Total Protein: 10.1 g/dL — ABNORMAL HIGH (ref 6.5–8.1)

## 2021-12-19 LAB — URINALYSIS, ROUTINE W REFLEX MICROSCOPIC
Bilirubin Urine: NEGATIVE
Glucose, UA: NEGATIVE mg/dL
Ketones, ur: NEGATIVE mg/dL
Nitrite: NEGATIVE
Protein, ur: 100 mg/dL — AB
Specific Gravity, Urine: 1.03 (ref 1.005–1.030)
WBC, UA: >50 WBC/hpf — ABNORMAL HIGH (ref 0–5)
pH: 5 (ref 5.0–8.0)

## 2021-12-19 LAB — CBC
HCT: 42.7 % (ref 36.0–46.0)
Hemoglobin: 13.6 g/dL (ref 12.0–15.0)
MCH: 29.4 pg (ref 26.0–34.0)
MCHC: 31.9 g/dL (ref 30.0–36.0)
MCV: 92.2 fL (ref 80.0–100.0)
Platelets: 319 10*3/uL (ref 150–400)
RBC: 4.63 MIL/uL (ref 3.87–5.11)
RDW: 14.4 % (ref 11.5–15.5)
WBC: 9.1 10*3/uL (ref 4.0–10.5)
nRBC: 0 % (ref 0.0–0.2)

## 2021-12-19 LAB — SALICYLATE LEVEL: Salicylate Lvl: <7 mg/dL — ABNORMAL LOW (ref 7.0–30.0)

## 2021-12-19 LAB — I-STAT BETA HCG BLOOD, ED (MC, WL, AP ONLY): I-stat hCG, quantitative: <5 m[IU]/mL (ref <5)

## 2021-12-19 LAB — ETHANOL: Alcohol, Ethyl (B): <10 mg/dL (ref <10)

## 2021-12-19 LAB — RAPID URINE DRUG SCREEN, HOSP PERFORMED
Amphetamines: NOT DETECTED
Barbiturates: NOT DETECTED
Benzodiazepines: NOT DETECTED
Cocaine: NOT DETECTED
Opiates: NOT DETECTED
Tetrahydrocannabinol: NOT DETECTED

## 2021-12-19 LAB — ACETAMINOPHEN LEVEL: Acetaminophen (Tylenol), Serum: <10 ug/mL — ABNORMAL LOW (ref 10–30)

## 2021-12-19 MED ORDER — ALUM & MAG HYDROXIDE-SIMETH 200-200-20 MG/5ML PO SUSP: 30.0000 mL | Freq: Four times a day (QID) | ORAL | Status: DC | PRN

## 2021-12-19 MED ORDER — ACETAMINOPHEN 325 MG PO TABS: 650.0000 mg | ORAL_TABLET | ORAL | Status: DC | PRN

## 2021-12-19 MED ORDER — ZOLPIDEM TARTRATE 5 MG PO TABS: 5.0000 mg | ORAL_TABLET | Freq: Every evening | ORAL | Status: DC | PRN

## 2021-12-19 MED ORDER — LORAZEPAM 1 MG PO TABS: 1.0000 mg | ORAL_TABLET | ORAL | Status: DC | PRN

## 2021-12-19 MED ORDER — ONDANSETRON HCL 4 MG PO TABS: 4.0000 mg | ORAL_TABLET | Freq: Three times a day (TID) | ORAL | Status: DC | PRN

## 2021-12-19 MED ORDER — ZIPRASIDONE MESYLATE 20 MG IM SOLR: 20.0000 mg | INTRAMUSCULAR | Status: DC | PRN

## 2021-12-19 MED ORDER — RISPERIDONE 1 MG PO TBDP
2.0000 mg | ORAL_TABLET | Freq: Three times a day (TID) | ORAL | Status: DC | PRN
  Filled 2021-12-19: qty 2

## 2021-12-19 NOTE — ED Triage Notes (Signed)
Patient BIB Police voluntarily with history of schizophrenia, fixated on religious ideas telling passersby they need to "repent because Jesus is coming back". Patient is calm and cooperative at this time. ?

## 2021-12-19 NOTE — ED Provider Notes (Signed)
?[facility] EMERGENCY DEPARTMENT ?Provider Note ? ? ?CSN: [id number] ?Arrival date & time: 12/19/21  1115 ? ?  ? ?History ?Chief Complaint  ?Patient presents with  ? Medical Clearance  ? ? ?Lindsey Richmond is a 26 y.o. female with schizophrenia, catatonia presenting w/ hyperreligious speech.  Patient identifies as a born again following Jesus and just wants everyone to know his love.  She reports because of professing this that her mom kicked her out of the house because she thinks she is psychotic.  Patient was brought in voluntarily by PD for talking to passerby was about Jesus.  She reports she was just trying to help everyone get to know him.  She reports God's love.  Denies any voices in her head that are not her own.  Denies seeing or hearing things that other people cannot hear or see.  Denies any suicidal thoughts.  Denies any homicidal thoughts.  She denies any symptoms at this time.  Any dysuria or changes in her urination.  Endorses decreased sleep but cannot quantify.  ? ?Collateral obtained from mom. Missing medications. When she is off her medication things get bad. Today was upset crying and how the world is going bad. Mom does not know what her medications are at this time. Believes she has not taken them in a couple weeks.  At times she is willing to take them but other times she is not.  When she does not take them mom reports she is hyperreligious. ? ?HPI ? ?  ? ?Home Medications ?Prior to Admission medications   ?Medication Sig Start Date End Date Taking? Authorizing Provider  ?benztropine (COGENTIN) 1 MG tablet Take 1 tablet (1 mg total) by mouth 2 (two) times daily. For prevention of tremors 10/06/20   Armandina Stammer I, NP  ?fluPHENAZine (PROLIXIN) 10 MG tablet Take 1 tablet (10 mg total) by mouth 2 (two) times daily. For mood control 10/06/20   Armandina Stammer I, NP  ?fluPHENAZine (PROLIXIN) 5 MG tablet Take 3 tablets (15 mg total) by mouth at bedtime. ?Patient taking differently: Take 5  mg by mouth at bedtime. Take one 5mg  tablet along w/ one 10mg  tablet, at bedtime 10/06/20   , NP  ?LORazepam (ATIVAN) 2 MG tablet Take 1 tablet (2 mg total) by mouth 2 (two) times daily. For severe anxiety ?Patient taking differently: Take 2 mg by mouth 2 (two) times daily as needed for anxiety. 10/06/20   Sanjuana Kava I, NP  ?paliperidone (INVEGA SUSTENNA) 156 MG/ML SUSY injection INJECT AS DIRECTED INPATIENT ON 09/29/20 09/28/20 09/28/21  09/30/20, MD  ?[facility] HFA 108 [phone] Base) MCG/ACT inhaler Inhale 2 puffs into the lungs 4 (four) times daily as needed for wheezing or shortness of breath. 10/12/20   [provider]  ?traZODone (DESYREL) 100 MG tablet Take 1 tablet (100 mg total) by mouth at bedtime. For sleep ?Patient not taking: Reported on 10/19/2020 10/06/20   10/21/2020 I, NP  ?   ? ?Allergies    ?Patient has no known allergies.   ? ?Review of Systems   ?Review of Systems  ?Constitutional:  Negative for chills and fever.  ?Genitourinary:  Negative for dysuria and hematuria.  ?Skin:  Negative for color change and rash.  ?Psychiatric/Behavioral:  Positive for behavioral problems. Negative for agitation, hallucinations and suicidal ideas. The patient is not nervous/anxious and is not hyperactive.   ? ?Physical Exam ?Updated Vital Signs ?BP 131/79 (BP Location: Right Arm)  Pulse 98   Temp 97.6 ?F (36.4 ?C) (Oral)   Resp 14   SpO2 99%  ?Physical Exam ?Constitutional:   ?   Appearance: She is obese.  ?HENT:  ?   Head: Normocephalic.  ?Pulmonary:  ?   Effort: Pulmonary effort is normal. No respiratory distress.  ?Musculoskeletal:     ?   General: No deformity.  ?Neurological:  ?   Mental Status: She is oriented to person, place, and time.  ?Psychiatric:  ?   Comments: Hyperrelgious ?Denies SI/ HI/ AVS  ? ? ?ED Results / Procedures / Treatments   ?Labs ?(all labs ordered are listed, but only abnormal results are displayed) ?Labs Reviewed  ?COMPREHENSIVE METABOLIC PANEL - Abnormal;  Notable for the following components:  ?    Result Value  ? Glucose, Bld 172 (*)   ? Creatinine, Ser 1.03 (*)   ? Total Protein 10.1 (*)   ? All other components within normal limits  ?SALICYLATE LEVEL - Abnormal; Notable for the following components:  ? Salicylate Lvl <7.0 (*)   ? All other components within normal limits  ?ACETAMINOPHEN LEVEL - Abnormal; Notable for the following components:  ? Acetaminophen (Tylenol), Serum <10 (*)   ? All other components within normal limits  ?URINALYSIS, ROUTINE W REFLEX MICROSCOPIC - Abnormal; Notable for the following components:  ? Color, Urine AMBER (*)   ? APPearance CLOUDY (*)   ? Hgb urine dipstick MODERATE (*)   ? Protein, ur 100 (*)   ? Leukocytes,Ua MODERATE (*)   ? WBC, UA >50 (*)   ? Bacteria, UA FEW (*)   ? Non Squamous Epithelial 6-10 (*)   ? All other components within normal limits  ?ETHANOL  ?CBC  ?RAPID URINE DRUG SCREEN, HOSP PERFORMED  ?DIFFERENTIAL  ?I-STAT BETA HCG BLOOD, ED ([facility], [facility], [facility] ONLY)  ? ? ?EKG ?None ? ?Radiology ?No results found. ? ?Procedures ?Procedures  ? ? ?Medications Ordered in ED ?Medications  ?risperiDONE (RISPERDAL M-TABS) disintegrating tablet 2 mg (has no administration in time range)  ?  And  ?LORazepam (ATIVAN) tablet 1 mg (has no administration in time range)  ?  And  ?ziprasidone (GEODON) injection 20 mg (has no administration in time range)  ?acetaminophen (TYLENOL) tablet 650 mg (has no administration in time range)  ?ondansetron (ZOFRAN) tablet 4 mg (has no administration in time range)  ?zolpidem (AMBIEN) tablet 5 mg (has no administration in time range)  ?alum & mag hydroxide-simeth (MAALOX/MYLANTA) 200-200-20 MG/5ML suspension 30 mL (has no administration in time range)  ? ? ?ED Course/ Medical Decision Making/ A&P ?  ?                        ?Medical Decision Making ?Amount and/or Complexity of Data Reviewed ?Labs: ordered. ? ?Risk ?OTC drugs. ?Prescription drug management. ? ? ?26 year old female presenting for  hyperreligious speech.  ? ?On chart review patient was admitted to behavioral [facility] from 09/09/19/2021 - 10/06/2020 for medication noncompliance and catatonia.  At that time she was on benztropine for EPS/tremors, Fluephenazine, Ativan as needed, and trazodone nightly.  Patient is unsure what current medication she is on.  She reports that her mom believes she is psychotic but she is not.  She does consist for the love of Jesus. ? ?On exam, calm and cooperative.  Reports her mood is good.  Endorses decreased sleep.  She is not responding to internal/external stimuli at this time.  She does  not appear to be in decompensated state at this time.  Spoke with mom and obtain collateral, she is concerned she is off medications.  She is normally comfortable taking care of her but due to her current state and medication noncompliance, she is not comfortable with her coming home at this time.  She reports last MDM to the hospital I sent her home without optimizing her care and getting her back to a better state. ? ?Labs are overall reassuring.  Creatinine of 0.03.  Glucose of 172.  Negative salicylate level, negative UDS, negative ethanol.  Patient is medically clear at this time.  Have not reordered home medications at this time given unsure what these medications are.  Last time they were documented was in January of last year.  No IVC in place at this time.  Plan for psychiatry to see this patient and provide recommendations regarding her hyperreligious state/medication plan. ? ?Patient seen in conjunction with my attending Dr. Rhunette CroftNanavati.  ? ?Final Clinical Impression(s) / ED Diagnoses ?Final diagnoses:  ?Behavior concern  ? ? ?Rx / DC Orders ?ED Discharge Orders   ? ? None  ? ?  ? ? ?  ?Micheline MazeKiehl, Staysha Truby, MD ?12/19/21 1617 ? ?  ?Derwood KaplanNanavati, Ankit, MD ?12/19/21 2042 ? ?

## 2021-12-19 NOTE — ED Provider Triage Note (Signed)
Emergency Medicine Provider Triage Evaluation Note ? ?[name] , a 26 y.o. female  was evaluated in triage.  Patient was brought in by police voluntarily.  Patient has a history of schizophrenia and schizoaffective disorder.  Pt complains of medical clearance.  Patient notes "I am a [place] and I believe that Jesus is coming back and everyone needs to repent." Denies chest pain, shortness of breath, abdominal pain.  Denies visual/auditory hallucinations, SI, HI.  Patient is unsure if she is compliant with her medications. ? ?Review of Systems  ?Positive: As per HPI above ?Negative: As per HPI above ? ?Physical Exam  ?BP 131/79 (BP Location: Right Arm)   Pulse 98   Temp 97.6 ?F (36.4 ?C) (Oral)   Resp 14   SpO2 99%  ?Gen:   Awake, no distress   ?Resp:  Normal effort  ?MSK:   Moves extremities without difficulty  ?Other:  No chest wall tenderness to palpation.  No abdominal tenderness to palpation. ? ?Medical Decision Making  ?Medically screening exam initiated at 12:24 PM.  Appropriate orders placed.  Debriana Butka was informed that the remainder of the evaluation will be completed by another provider, this initial triage assessment does not replace that evaluation, and the importance of remaining in the ED until their evaluation is complete. ?  ?Krystyne Tewksbury A, PA-C ?12/19/21 1229 ? ?

## 2021-12-20 ENCOUNTER — Telehealth (HOSPITAL_COMMUNITY): Payer: Self-pay | Admitting: Internal Medicine

## 2021-12-20 DIAGNOSIS — F200 Paranoid schizophrenia: Secondary | ICD-10-CM

## 2021-12-20 MED ORDER — BENZTROPINE MESYLATE 1 MG PO TABS
1.0000 mg | ORAL_TABLET | Freq: Two times a day (BID) | ORAL | Status: DC
  Administered 2021-12-20 (×2): 1 mg via ORAL
  Filled 2021-12-20 (×3): qty 1

## 2021-12-20 MED ORDER — FLUPHENAZINE HCL 5 MG PO TABS
5.0000 mg | ORAL_TABLET | Freq: Every day | ORAL | Status: DC
  Administered 2021-12-20: 5 mg via ORAL
  Filled 2021-12-20: qty 1

## 2021-12-20 MED ORDER — FLUPHENAZINE HCL 5 MG PO TABS
10.0000 mg | ORAL_TABLET | Freq: Every day | ORAL | Status: DC
  Administered 2021-12-20: 10 mg via ORAL
  Filled 2021-12-20 (×2): qty 2

## 2021-12-20 MED ORDER — ALBUTEROL SULFATE HFA 108 (90 BASE) MCG/ACT IN AERS: 2.0000 | INHALATION_SPRAY | Freq: Four times a day (QID) | RESPIRATORY_TRACT | Status: DC | PRN

## 2021-12-20 NOTE — ED Provider Notes (Signed)
Emergency Medicine Observation Re-evaluation Note ? ?Lindsey Richmond is a 26 y.o. female, seen on rounds today.  Pt initially presented to the ED for complaints of Medical Clearance ?Currently, the patient is eating breakfast. ? ?Physical Exam  ?BP 140/68 (BP Location: Left Arm)   Pulse 91   Temp 98.1 ?F (36.7 ?C)   Resp 20   Ht 5\' 4"  (1.626 m)   Wt 136.1 kg   SpO2 100%   BMI 51.49 kg/m?  ?Physical Exam ?General: Calm, nondistressed ?Cardiac: Extremities well perfused ?Lungs: Breathing is unlabored ?Psych: No agitation, not responding to internal stimuli ? ?ED Course / MDM  ?EKG:EKG Interpretation ? ?Date/Time:  Monday December 19 2021 15:25:09 EDT ?Ventricular Rate:  99 ?PR Interval:  168 ?QRS Duration: 80 ?QT Interval:  352 ?QTC Calculation: 451 ?R Axis:   42 ?Text Interpretation: Normal sinus rhythm Possible Anterior infarct , age undetermined Abnormal ECG When compared with ECG of 29-Sep-2020 15:09, PREVIOUS ECG IS PRESENT No acute changes Confirmed by 01-Oct-2020 [phone]) on 12/19/2021 4:50:58 PM ? ?I have reviewed the labs performed to date as well as medications administered while in observation.  Recent changes in the last 24 hours include patient presented to the ED yesterday for decompensated schizophrenia characterized by hyperreligious speech.  She is medically cleared.  Home medications ordered.  She was evaluated by psychiatry today.  Recommendation is for inpatient psychiatric care. ? ?Plan  ?Current plan is for inpatient psychiatric admission. ? Lindsey Richmond is not under involuntary commitment. ? ? ?  ?[name], MD ?12/20/21 [phone] ? ?

## 2021-12-20 NOTE — BH Assessment (Signed)
Care Management - Follow Up [facility] Discharges  ? ?Writer made contact with the patient and her mother.  Patient reports that she will follow up with her established provider Vesta Mixer).   ?

## 2021-12-20 NOTE — Consult Note (Signed)
[facility] Psych ED Discharge ? ?12/20/2021 2:35 PM ?[name]  ?MRN:  [id number] ? ?Method of visit?: Face to Face  ? ?Principal Problem: Schizophrenia, paranoid ([place]) ?Discharge Diagnoses: Principal Problem: ?  Schizophrenia, paranoid ([place]) ? ? ?Subjective: Lindsey Richmond was seen and evaluated face-to-face.  She denies suicidal or homicidal ideations.  Denies auditory visual hallucinations.  NP spoke to patient's mother for additional collateral who reports patient has not been taking medications as indicated.  Patient was restarted on Prolixin while in the emergency department.  She has been tolerating medications well.  Mother was okay with patient returning back home.  States she is going to follow-up to consider long-acting injectable. ? ?Patient remains hyperreligious during this assessment mother reports this is patient's baseline. ? ? Per admission assessment note: Patient is a 26 year old female with a history of Schizophrenia, paranoid type who presents voluntarily via [employer] to [facility] for assessment.  Patient states, "My parents say I am hallucinating but I am not, I am just spreading the love of Jesus". Patient is pleasant, engaging and AAOx5 upon assessment.  She states she left home this morning, to go the [place] center to "profess Jesus" and did not let her mother know she was leaving.  Mother called [employer] to report her missing and patient was agreeable with coming in for assessment. ? ? ?Total Time spent with patient: 15 minutes ? ?Past Psychiatric History:  ? ?Past Medical History:  ?Past Medical History:  ?Diagnosis Date  ? Anxiety   ? Depression   ? Oppositional defiant behavior   ? Schizo affective schizophrenia ([place])   ? Schizophrenia ([place])   ? History reviewed. No pertinent surgical history. ?Family History:  ?Family History  ?Problem Relation Age of Onset  ? Heart failure Mother   ? Hypertension Mother   ? Diabetes Father   ? ?Family Psychiatric  History: ?Social History:  ?Social  History  ? ?Substance and Sexual Activity  ?Alcohol Use No  ?   ?Social History  ? ?Substance and Sexual Activity  ?Drug Use No  ?  ?Social History  ? ?Socioeconomic History  ? Marital status: Single  ?  Spouse name: Not on file  ? Number of children: Not on file  ? Years of education: Not on file  ? Highest education level: Not on file  ?Occupational History  ? Occupation: Unemployed  ?Tobacco Use  ? Smoking status: Never  ? Smokeless tobacco: Never  ?Vaping Use  ? Vaping Use: Never used  ?Substance and Sexual Activity  ? Alcohol use: No  ? Drug use: No  ? Sexual activity: Never  ?  Birth control/protection: None  ?Other Topics Concern  ? Not on file  ?Social History Narrative  ? ** Merged History Encounter **  ?    ? Pt lives with her mother, brother, and father.  She receives outpatient [facility] care through [facility].  ? ?Social Determinants of Health  ? ?Financial Resource Strain: Not on file  ?Food Insecurity: Not on file  ?Transportation Needs: Not on file  ?Physical Activity: Not on file  ?Stress: Not on file  ?Social Connections: Not on file  ? ? ?Tobacco Cessation:  N/A, patient does not currently use tobacco products ? ?Current Medications: ?Current Facility-Administered Medications  ?Medication Dose Route Frequency Provider Last Rate Last Admin  ? acetaminophen (TYLENOL) tablet 650 mg  650 mg Oral Q4H PRN Lupita Dawn, MD      ? albuterol (VENTOLIN HFA) 108 (90  Base) MCG/ACT inhaler 2 puff  2 puff Inhalation QID PRN Lupita Dawn, MD      ? alum & mag hydroxide-simeth (MAALOX/MYLANTA) 200-200-20 MG/5ML suspension 30 mL  30 mL Oral Q6H PRN Lupita Dawn, MD      ? benztropine (COGENTIN) tablet 1 mg  1 mg Oral BID Lupita Dawn, MD   1 mg at 12/20/21 1046  ? fluPHENAZine (PROLIXIN) tablet 10 mg  10 mg Oral QHS Lupita Dawn, MD   10 mg at 12/20/21 0100  ? fluPHENAZine (PROLIXIN) tablet 5 mg  5 mg Oral Daily Lupita Dawn, MD   5 mg at 12/20/21 1046  ? risperiDONE (RISPERDAL M-TABS) disintegrating tablet 2 mg   2 mg Oral Q8H PRN Lupita Dawn, MD      ? And  ? LORazepam (ATIVAN) tablet 1 mg  1 mg Oral PRN Lupita Dawn, MD      ? And  ? ziprasidone (GEODON) injection 20 mg  20 mg Intramuscular PRN Lupita Dawn, MD      ? ondansetron [facility]) tablet 4 mg  4 mg Oral Q8H PRN Lupita Dawn, MD      ? zolpidem (AMBIEN) tablet 5 mg  5 mg Oral QHS PRN Lupita Dawn, MD      ? ?Current Outpatient Medications  ?Medication Sig Dispense Refill  ? benztropine (COGENTIN) 1 MG tablet Take 1 tablet (1 mg total) by mouth 2 (two) times daily. For prevention of tremors 60 tablet 0  ? doxycycline (VIBRAMYCIN) 100 MG capsule Take 100 mg by mouth 2 (two) times daily.    ? fluPHENAZine (PROLIXIN) 10 MG tablet Take 1 tablet (10 mg total) by mouth 2 (two) times daily. For mood control 60 tablet 0  ? fluPHENAZine (PROLIXIN) 5 MG tablet Take 3 tablets (15 mg total) by mouth at bedtime. (Patient taking differently: Take 5 mg by mouth at bedtime. Take one 5mg  tablet along w/ one 10mg  tablet, at bedtime) 30 tablet 0  ? LORazepam (ATIVAN) 2 MG tablet Take 1 tablet (2 mg total) by mouth 2 (two) times daily. For severe anxiety (Patient taking differently: Take 2 mg by mouth 2 (two) times daily as needed for anxiety.) 10 tablet 0  ? paliperidone (INVEGA SUSTENNA) 156 MG/ML SUSY injection INJECT AS DIRECTED INPATIENT ON 09/29/20 1 mL 0  ? PROAIR HFA 108 (90 Base) MCG/ACT inhaler Inhale 2 puffs into the lungs 4 (four) times daily as needed for wheezing or shortness of breath.    ? traZODone (DESYREL) 100 MG tablet Take 1 tablet (100 mg total) by mouth at bedtime. For sleep 30 tablet 0  ? tretinoin (RETIN-A) 0.05 % cream Apply 1 application. topically at bedtime.    ? ?PTA Medications: ?(Not in a hospital admission) ? ? ?Musculoskeletal: ?Strength & Muscle Tone: within normal limits ?Gait & Station: normal ?Patient leans: N/A ? ?Psychiatric Specialty Exam: ? ?Presentation  ?General Appearance: Appropriate for Environment ? ?Eye Contact:Fair ? ?Speech:Clear and  Coherent ? ?Speech Volume:Normal ? ?Handedness:No data recorded ? ?Mood and Affect  ?Mood:Euthymic ? ?Affect:Congruent ? ? ?Thought Process  ?Thought Processes:Coherent ? ?Descriptions of Associations:Intact ? ?Orientation:Full (Time, Place and Person) ? ?Thought Content:Logical ? ?History of Schizophrenia/Schizoaffective disorder:Yes ? ?Duration of Psychotic Symptoms:Greater than six months ? ?Hallucinations:No data recorded ?Ideas of Reference:None ? ?Suicidal Thoughts:No data recorded ?Homicidal Thoughts:No data recorded ? ?Sensorium  ?Memory:Immediate Fair; Remote Fair; Recent Fair ? ?Judgment:Fair ? ?Insight:Fair ? ? ?Executive Functions  ?Concentration:Fair ? ?Attention Span:Fair ? ?Recall:Fair ? ?Severance ? ?  Language:Fair ? ? ?Psychomotor Activity  ?Psychomotor Activity:No data recorded ? ?Assets  ?Assets:Communication Skills; Desire for Improvement; Financial Resources/Insurance; Housing; Leisure Time; Physical Health; Social Support; Transportation ? ? ?Sleep  ?Sleep:No data recorded ? ? ?Physical Exam: ?Physical Exam ?Vitals and nursing note reviewed.  ?Cardiovascular:  ?   Rate and Rhythm: Normal rate and regular rhythm.  ?Neurological:  ?   Mental Status: She is alert.  ?Psychiatric:     ?   Mood and Affect: Mood normal.     ?   Thought Content: Thought content normal.  ? ?ROS ?Blood pressure 124/60, pulse 80, temperature 97.9 ?F (36.6 ?C), temperature source Oral, resp. rate 18, height 5\' 4"  (1.626 m), weight 136.1 kg, SpO2 100 %. Body mass index is 51.49 kg/m?. ? ? ?Demographic Factors:  ?Adolescent or young adult ? ?Loss Factors: ?NA ? ?Historical Factors: ?NA ? ?Risk Reduction Factors:   ?Living with another person, especially a relative, Positive social support, and Positive therapeutic relationship ? ?Continued Clinical Symptoms:  ?Schizophrenia:   Paranoid or undifferentiated type ? ?Cognitive Features That Contribute To Risk:  ?Closed-mindedness   ? ?Suicide Risk:  ?Minimal: No  identifiable suicidal ideation.  Patients presenting with no risk factors but with morbid ruminations; may be classified as minimal risk based on the severity of the depressive symptoms ? ? ? ?Plan Of Care/Fo

## 2021-12-20 NOTE — BH Assessment (Addendum)
Comprehensive Clinical Assessment (CCA) Screening, Triage and Referral Note ? ?12/20/2021 ?Georgiann Neider ?[id number] ?Disposition: Clinicican discussed patient care with Melbourne Abts, PA.  He recommended inpatient psychiatric care for patient.  RN Armond Hang informed of disposition via secure messaging.   ? ?Pt has a flat affect.  She will smile to say "Jesus loves you."  Patient not oriented to time.  Patient appears to be listening to internal stimuli although she denies this.  Pt can express herself clearly but she is not familiar with facts.  Pt reports appetite WNL but she does not sleep well. ? ?Pt is not clear about who her outapt providers are.  Pt has had multiple admissions to [facility] in 2021.   ? ? ?Chief Complaint:  ?Chief Complaint  ?Patient presents with  ? Medical Clearance  ? ?Visit Diagnosis: Schizophrenia ? ?Patient Reported Information ?How did you hear about [place]? Legal System ? ?What Is the Reason for Your Visit/Call Today? Pt was brought from house to [facility] by police.  Pt says that her mother had her over because she is not taking her medication.  Pt greeted clinicain by saying "Jesus loves you. "  Pt does not take the medication she is prescribed.  She does not know the names of them.  Pt denies any SI.  No HI or A/V hallucinations.  Pt denies use of ETOH or other substances.  Pt denies access to weapons.  Pt appetite is WNL.  Pt sleep is poor, getting up and not being able to go back to sleep.  Pt is living with parens and brother at home.  She is unemployed. ? ?How Long Has This Been Causing You Problems? 1 wk - 1 month ? ?What Do You Feel Would Help You the Most Today? Treatment for Depression or other mood problem ? ? ?Have You Recently Had Any Thoughts About Hurting Yourself? No ? ?Are You Planning to Commit Suicide/Harm Yourself At This time? No ? ? ?Have you Recently Had Thoughts About Hurting Someone [name]? No ? ?Are You Planning to Harm Someone at This Time? No ? ?Explanation: No data recorded ? ?Have  You Used Any Alcohol or Drugs in the Past 24 Hours? No ? ?How Long Ago Did You Use Drugs or Alcohol? No data recorded ?What Did You Use and How Much? No data recorded ? ?Do You Currently Have a Therapist/Psychiatrist? Yes ? ?Name of Therapist/Psychiatrist: Unclear if patient is med compliant based on chart review, unable to reach mother. ? ? ?Have You Been Recently Discharged From Any Office Practice or Programs? No ? ?Explanation of Discharge From Practice/Program: No data recorded ?  ?CCA Screening Triage Referral Assessment ?Type of Contact: Tele-Assessment ? ?Telemedicine Service Delivery:   ?Is this Initial or Reassessment? Initial Assessment ? ?Date Telepsych consult ordered in [facility]:  12/19/21 ? ?Time Telepsych consult ordered in [facility]:  1532 ? ?Location of Assessment: [facility] ED ? ?Provider Location[facility] ? ? ?Collateral Involvement: Attempted to call mother ? ? ?Does Patient Have a Automotive engineer Guardian? No data recorded ?Name and Contact of Legal Guardian: No data recorded ?If Minor and Not Living with Parent(s), Who has Custody? No data recorded ?Is CPS involved or ever been involved? Never ? ?Is APS involved or ever been involved? Never ? ? ?Patient Determined To Be At Risk for Harm To Self or Others Based on Review of Patient Reported Information or Presenting Complaint? No ? ?Method: No data recorded ?Availability of Means: No data recorded ?  Intent: No data recorded ?Notification Required: No data recorded ?Additional Information for Danger to Others Potential: No data recorded ?Additional Comments for Danger to Others Potential: No data recorded ?Are There Guns or Other Weapons in Your Home? No data recorded ?Types of Guns/Weapons: No data recorded ?Are These Weapons Safely Secured?                            No data recorded ?Who Could Verify You Are Able To Have These Secured: No data recorded ?Do You Have any Outstanding Charges, Pending Court Dates, Parole/Probation? No data  recorded ?Contacted To Inform of Risk of Harm To Self or Others: No data recorded ? ?Does Patient Present under Involuntary Commitment? No ? ?IVC Papers Initial File Date: No data recorded ? ?[place] of Residence: [place] ? ? ?Patient Currently Receiving the Following Services: Medication Management ? ? ?Determination of Need: Urgent (48 hours) ? ? ?Options For Referral: Inpatient Hospitalization ? ? ?Discharge Disposition:  ?  ? ?Beatriz Stallion Ray, LCAS ? ? ?  ?  ?  ? ? ?

## 2021-12-20 NOTE — ED Notes (Signed)
Offered pt a snack to hold her over until lunch arrive, she declined.  ?

## 2021-12-20 NOTE — ED Notes (Signed)
Placed breakfast order ?

## 2021-12-20 NOTE — ED Notes (Signed)
Pt given belongings. Pt's family member called for ride. No response.  ?

## 2021-12-20 NOTE — ED Notes (Signed)
Pt is calm and cooperative at this time. She's sitting on the side of stretcher. She does keep repeating that "Jesus is coming back" and that "we all need to repent". I have had to tell her a few times to pull her shirt down as she's in a hallway bed and people can see her chest when she pulls up her shirt. With that said pt is redirectable.   ?

## 2021-12-20 NOTE — ED Notes (Signed)
Patient verbalizes understanding of discharge instructions. Opportunity for questioning and answers were provided. Armband removed by staff, pt discharged from ED and ambulated to lobby to return home.   

## 2021-12-21 ENCOUNTER — Ambulatory Visit (HOSPITAL_COMMUNITY)
Admission: EM | Admit: 2021-12-21 | Discharge: 2021-12-21 | Disposition: A | Discharge status: Home / Self Care | Payer: Medicare Other | Attending: Psychiatry | Admitting: Psychiatry

## 2021-12-21 DIAGNOSIS — F200 Paranoid schizophrenia: Secondary | ICD-10-CM | POA: Insufficient documentation

## 2021-12-21 DIAGNOSIS — Z79899 Other long term (current) drug therapy: Secondary | ICD-10-CM | POA: Insufficient documentation

## 2021-12-21 DIAGNOSIS — Z9114 Patient's other noncompliance with medication regimen: Secondary | ICD-10-CM | POA: Diagnosis not present

## 2021-12-21 MED ORDER — BENZTROPINE MESYLATE 1 MG PO TABS
1.0000 mg | ORAL_TABLET | Freq: Two times a day (BID) | ORAL | Status: DC
  Administered 2021-12-21: 1 mg via ORAL
  Filled 2021-12-21: qty 1

## 2021-12-21 MED ORDER — FLUPHENAZINE HCL 5 MG PO TABS
15.0000 mg | ORAL_TABLET | Freq: Every day | ORAL | Status: DC
Start: 2021-12-21 — End: 2021-12-22
  Administered 2021-12-21: 15 mg via ORAL
  Filled 2021-12-21: qty 3

## 2021-12-21 MED ORDER — TRAZODONE HCL 100 MG PO TABS
100.0000 mg | ORAL_TABLET | Freq: Every day | ORAL | Status: DC
  Administered 2021-12-21: 100 mg via ORAL
  Filled 2021-12-21: qty 1

## 2021-12-21 NOTE — Discharge Instructions (Addendum)
Follow up with patient psychiatric at [facility] psychiatric  ? ?

## 2021-12-21 NOTE — ED Notes (Signed)
Pt mother and guardian called by staff informed of discharge  pt given discharge information  ?

## 2021-12-21 NOTE — ED Provider Notes (Signed)
Behavioral Health Urgent Care Medical Screening Exam ? ?Patient Name: Lindsey Richmond ?MRN: [id number] ?Date of Evaluation: 12/21/21 ?Chief Complaint:   ?Diagnosis:  ?Final diagnoses:  ?Schizophrenia, paranoid (HCC)  ?Noncompliance with medication regimen  ? ? ?History of Present illness: Lindsey Richmond is a 26 y.o. female. Voluntarily via [employer], because of medication non-compliance.  Pt has a history of Schizophrenia, Pt gave permission to speak with mother [phone]),  according to mother patient refuse to take her medication and whenever the medications are out of her system she acts up.  According to mother, the patient stated "I don't need to take that medication".  Mother stated she is unable to pick up patient from [facility] but will see if her son would,  however the son is not available to pick patient up from [facility]. ? ?Observation  of patient: pt is partially alert,  stating she is sleepy but very easily arose,  stating I just want to go to bed.  Pt is very hyper-religious saying "Jesus say you need to repent". I just want to spread the love.   Pt denies SI, HI or Paranoia.   ? ?Pt takes the following medications: Trazodone 100 mg qhs,  Paliperidone 156 mg/ml susy injectable,  Lorazepam 2 mg tab BID, Prolixin 5 mg (3 tablet to equal 15 mg) qhs, Prolixin 10 mg daily, cogentin 1 mg BID,  doxycyline 100 mg BID.  ? ?Will give patient night time medications before discharge.  ? ?Psychiatric Specialty Exam ? ?Presentation  ?General Appearance:Appropriate for Environment ? ?Eye Contact:Fair ? ?Speech:Slow ? ?Speech Volume:Decreased ? ?Handedness:Ambidextrous ? ? ?Mood and Affect  ?Mood:Euphoric ? ?Affect:Flat ? ? ?Thought Process  ?Thought Processes:Disorganized ? ?Descriptions of Associations:Tangential ? ?Orientation:Full (Time, Place and Person) ? ?Thought Content:Obsessions ? Diagnosis of Schizophrenia or Schizoaffective disorder in past: Yes ? Duration of Psychotic Symptoms: Greater than six months ?  Hallucinations:Command ?Jesus say you need to repent. ? ?Ideas of Reference:None ? ?Suicidal Thoughts:No ? ?Homicidal Thoughts:No ? ? ?Sensorium  ?Memory:Immediate Fair ? ?Judgment:Poor ? ?Insight:Poor ? ? ?Executive Functions  ?Concentration:Poor ? ?Attention Span:Fair ? ?Recall:Fair ? ?Fund of Knowledge:Fair ? ?Language:Fair ? ? ?Psychomotor Activity  ?Psychomotor Activity:Normal ? ? ?Assets  ?Assets:Communication Skills ? ? ?Sleep  ?Sleep:Good ? ?Number of hours: 6 ? ? ?Nutritional Assessment (For OBS and FBC admissions only) ?Has the patient had a weight loss or gain of 10 pounds or more in the last 3 months?: No ?Has the patient had a decrease in food intake/or appetite?: No ?Does the patient have dental problems?: No ?Does the patient have eating habits or behaviors that may be indicators of an eating disorder including binging or inducing vomiting?: No ?Has the patient recently lost weight without trying?: 0 ? ? ? ?Physical Exam: ?Physical Exam ?HENT:  ?   Head: Normocephalic.  ?Cardiovascular:  ?   Rate and Rhythm: Normal rate.  ?Pulmonary:  ?   Effort: Pulmonary effort is normal.  ?Abdominal:  ?   General: Abdomen is flat.  ?Musculoskeletal:     ?   General: Normal range of motion.  ?   Cervical back: Normal range of motion.  ?Neurological:  ?   Mental Status: She is alert. Mental status is at baseline.  ?Psychiatric:     ?   Behavior: Behavior normal.     ?   Thought Content: Thought content normal.     ?   Judgment: Judgment normal.  ? ?Review of Systems  ?Constitutional: Negative.   ?HENT: Negative.    ?  Eyes: Negative.   ?Respiratory: Negative.    ?Cardiovascular: Negative.   ?Gastrointestinal: Negative.   ?Genitourinary: Negative.   ?Musculoskeletal: Negative.   ?Skin:  Positive for rash.  ?Psychiatric/Behavioral:  Positive for depression and hallucinations. The patient is nervous/anxious.   ?Blood pressure 114/84, pulse 98, temperature 97.6 ?F (36.4 ?C), temperature source Oral, resp. rate 16, SpO2 100  %. There is no height or weight on file to calculate BMI. ? ?Musculoskeletal: ?Strength & Muscle Tone: within normal limits ?Gait & Station: normal ?Patient leans: N/A ? ? ?[facility] MSE Discharge Disposition for Follow up and Recommendations: ?Based on my evaluation the patient does not appear to have an emergency medical condition and can be discharged with resources and follow up care in outpatient services for Medication Management ? ? ?Sindy Guadeloupe, NP ?12/21/2021, 10:37 PM ? ?

## 2021-12-21 NOTE — Progress Notes (Signed)
?   12/21/21 2005  ?Patient Reported Information  ?How Did You Hear About [place]? Legal System  ?What Is the Reason for Your Visit/Call Today? Pt reports, "everytimes I profess Jesus Christ my mom says get out of this house, because my mom doesn't like Jesus." Pt was a poor historian and provided little information during assessment. Pt asked for a bed because she was tired. Pt denies, SI, HI, AVH, self-injurious behaviors and access to weapons.  ?How Long Has This Been Causing You Problems? > than 6 months  ?What Do You Feel Would Help You the Most Today? Medication(s)  ?Have You Recently Had Any Thoughts About Hurting Yourself? No ?(Pt denies.)  ?Are You Planning to Commit Suicide/Harm Yourself At This time? No  ?Have you Recently Had Thoughts About Hurting Someone [name]? No ?(Pt denies.)  ?Are You Planning To Harm Someone At This Time? No  ?Have You Used Any Alcohol or Drugs in the Past 24 Hours? No  ?Do You Currently Have a Therapist/Psychiatrist? Yes  ?Name of Therapist/Psychiatrist Vernetta Honey for medication management. Pt did not list her medications, even when asked.  ?CCA Screening Triage Referral Assessment  ?Type of Contact Face-to-Face  ?Location of Assessment [facility] Assessment Services  ?Provider location [facility] Assessment Services  ?Collateral Involvement Pt consented for clinician to contact her mother Jakeria Caissie, [phone]) to gather collateral information. Clinician did not leave a message due to having an unidentifiable voicemail. Clinician received a call back from the pt's mother who expressed the pt will not take her medications, she did better with injections and is diagnosed with Schizophrenia. [name] reports, the pt alleged she threw her medications in the trash. Clinician suggested to the mother she meet with the pt's provider to get back on the injections. [name] provided a list of the pt's medications. Per mother, the pt is always talking about God. [name] denies, the pt is  not aggressive. Per mother she wants to get the process started to obtain guardianship.  ?Is CPS involved or ever been involved?  ?(UTA)  ?Is APS involved or ever been involved?  ?(UTA)  ?Patient Determined To Be At Risk for Harm To Self or Others Based on Review of Patient Reported Information or Presenting Complaint? No  ?Does Patient Present under Involuntary Commitment? No  ?[place] of Residence [place]  ?Patient Currently Receiving the Following Services: Medication Management  ?Determination of Need Routine (7 days)  ?Options For Referral Medication Management;BH Urgent Care;Inpatient Hospitalization;Outpatient Therapy  ? ? ?Determination of need: Routine.  ? ? ?Redmond Pulling, MS, [facility], [facility] ?Triage Specialist ?[phone] ? ?

## 2021-12-21 NOTE — BH Assessment (Signed)
At 2113, clinician spoke to pt's mother that the pt will receive a dose of her night time medication and will be transported home by [employer]. Mom is agreeable to discharge plan.  ? ? ?Vertell Novak, MS, [facility], [facility] ?Triage Specialist ?[phone] ? ?

## 2021-12-29 ENCOUNTER — Ambulatory Visit (HOSPITAL_COMMUNITY)
Admission: EM | Admit: 2021-12-29 | Discharge: 2021-12-29 | Disposition: A | Discharge status: Home / Self Care | Payer: Medicare Other | Attending: Psychiatry | Admitting: Psychiatry

## 2021-12-29 DIAGNOSIS — F209 Schizophrenia, unspecified: Secondary | ICD-10-CM | POA: Diagnosis present

## 2021-12-29 DIAGNOSIS — F200 Paranoid schizophrenia: Secondary | ICD-10-CM | POA: Diagnosis not present

## 2021-12-29 NOTE — ED Notes (Signed)
Patient refuses AVS.  [employer] here to pick up. Patient with no sxs of distress and walked out with [employer] ?

## 2021-12-29 NOTE — BH Assessment (Addendum)
Comprehensive Clinical Assessment (CCA) Note  12/29/2021 Lindsey Richmond [id number]  Chief Complaint:  Chief Complaint  Patient presents with   Crisis Assessment   Visit Diagnosis:  Schizophrenia  Disposition: Per Melbourne Abts PA pt does not meet inpatient criteria and can be discharged with outpatient resources.    Flowsheet Row ED from 12/29/2021 in [facility] ED from 12/19/2021 in [facility] EMERGENCY DEPARTMENT Admission (Discharged) [facility]  C-SSRS RISK CATEGORY No Risk No Risk No Risk      The patient demonstrates the following risk factors for suicide: Chronic risk factors for suicide include: psychiatric disorder of schizophrenia . Acute risk factors for suicide include: family or marital conflict. Protective factors for this patient include: religious beliefs against suicide. Considering these factors, the overall suicide risk at this point appears to be low. Patient is appropriate for outpatient follow up.   Maleta is a 26 yo female transported to [facility] by [employer] for evaluation.  Pt states that she got into an argument with her mother "my mom said I need to get a job or get out".  Pt states that she got upset and called the police.  Pt reports that she resides with her mother and brother. Pt is denying any current SI, HI, or AVH.  Pt denies any substance use.  Pt is laughing/giggling at inappropriate times throughout assessment. Pt states that she is tired and just wants [place] to "find her a bed" and let her sleep here.  Collateral information:  Pt gave verbal consent to reach mother, Natina Pizzolato [phone]. Pts mother answered and stated that she is "getting tired of Avryl not wanting to do dishes in the home where she is living for free.".  Pts mother states that she (mom) uses a walker and is having a hard time taking care of her home and Julionna isn't helping her.  Pts mother confirms  that Katrell did not make any suicidal or homicidal statements.  Pts mother did state "I am not able to pick her up so don't yall call me to get her because I can't".  CCA Screening, Triage and Referral (STR)  Patient Reported Information How did you hear about [place]? Legal System  What Is the Reason for Your Visit/Call Today? Anquenette is a 26 yo female transported to [facility] by [employer] for evaluation.  Pt states that she got into an argument with her mother "my mom said I need to get a job or get out".  Pt states that she got upset and called the police.  Pt reports that she resides with her mother and brother. Pt is denying any current SI, HI, or AVH.  Pt denies any substance use.  Pt is laughing/giggling at inappropriate times throughout assessment. Pt states that she is tired and just wants [place] to "find her a bed" and let her sleep here.  How Long Has This Been Causing You Problems? 1-6 months  What Do You Feel Would Help You the Most Today? Treatment for Depression or other mood problem   Have You Recently Had Any Thoughts About Hurting Yourself? No  Are You Planning to Commit Suicide/Harm Yourself At This time? No   Have you Recently Had Thoughts About Hurting Someone [name]? No  Are You Planning to Harm Someone at This Time? No  Explanation: No data recorded  Have You Used Any Alcohol or Drugs in the Past 24 Hours? No  How Long Ago  Did You Use Drugs or Alcohol? No data recorded What Did You Use and How Much? No data recorded  Do You Currently Have a Therapist/Psychiatrist? Yes  Name of Therapist/Psychiatrist: Vernetta Honey for medication management. Pt states she is taking olanzapine.   Have You Been Recently Discharged From Any Office Practice or Programs? Yes  Explanation of Discharge From Practice/Program: Pt discharged from [facility] 3/22 after presenting for similar symptoms/situations     CCA Screening Triage Referral Assessment Type of Contact: Face-to-Face  Telemedicine Service  Delivery:   Is this Initial or Reassessment? Initial Assessment  Date Telepsych consult ordered in [facility]:  12/19/21  Time Telepsych consult ordered in [facility]:  1532  Location of Assessment: [facility] Assessment Services  Provider Location: [facility] Assessment Services   Collateral Involvement: Pt gave verbal consent to reach mother, Jackquline Ireland [phone]. Pts mother answered and stated that she is "getting tired of [place] not wanting to do dishes in the home where she is living for free.".  Pts mother states that she (mom) uses a walker and is having a hard time taking care of her homem and Yuvia isn't helping her.  Pts mother confirms that Venora did not make any suicidal or homicidal statements.  Pts mother did state "I am not able to pick her up so don't yall call me to get her because I can't".   Does Patient Have a Automotive engineer Guardian? No data recorded Name and Contact of Legal Guardian: No data recorded If Minor and Not Living with Parent(s), Who has Custody? n/a  Is CPS involved or ever been involved? Never  Is APS involved or ever been involved? Never   Patient Determined To Be At Risk for Harm To Self or Others Based on Review of Patient Reported Information or Presenting Complaint? Yes, for Self-Harm  Method: No data recorded Availability of Means: No data recorded Intent: No data recorded Notification Required: No data recorded Additional Information for Danger to Others Potential: No data recorded Additional Comments for Danger to Others Potential: No data recorded Are There Guns or Other Weapons in Your Home? No data recorded Types of Guns/Weapons: No data recorded Are These Weapons Safely Secured?                            No data recorded Who Could Verify You Are Able To Have These Secured: No data recorded Do You Have any Outstanding Charges, Pending Court Dates, Parole/Probation? No data recorded Contacted To Inform of Risk of Harm To Self or Others: Other:  Comment (n/a)    Does Patient Present under Involuntary Commitment? No  IVC Papers Initial File Date: No data recorded  [place] of Residence: [place]   Patient Currently Receiving the Following Services: Medication Management   Determination of Need: Routine (7 days)   Options For Referral: Medication Management; Outpatient Therapy     CCA Biopsychosocial Patient Reported Schizophrenia/Schizoaffective Diagnosis in Past: Yes   Strengths: Reports she is medication compliant, pleasant, has support   Mental Health Symptoms Depression:   Hopelessness; Irritability (sometimes)   Duration of Depressive symptoms:  Duration of Depressive Symptoms: Greater than two weeks   Mania:   Irritability   Anxiety:    Worrying   Psychosis:   Delusions (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 that just needs to be published)   Duration of Psychotic symptoms:  Duration of Psychotic Symptoms: Greater than six  months   Trauma:   N/A   Obsessions:   None   Compulsions:   None (UTA)   Inattention:   N/A   Hyperactivity/Impulsivity:   N/A   Oppositional/Defiant Behaviors:   Defies rules   Emotional Irregularity:   None   Other Mood/Personality Symptoms:   Pt states that she is not religious but wants to be    Mental Status Exam Appearance and self-care  Stature:   Average   Weight:   Obese   Clothing:   Disheveled   Grooming:   Neglected   Cosmetic use:   None   Posture/gait:   Normal   Motor activity:   Not Remarkable   Sensorium  Attention:   Normal   Concentration:   Anxiety interferes   Orientation:   X5   Recall/memory:   Normal   Affect and Mood  Affect:   Anxious   Mood:   Anxious (twirling hair and rocking throughout assessment)   Relating  Eye contact:   Normal   Facial expression:   Responsive   Attitude toward examiner:   Cooperative; Presenter, broadcasting and Language  Speech flow:  Garbled;  Slow   Thought content:   Appropriate to Mood and Circumstances; Delusions   Preoccupation:   None   Hallucinations:   None   Organization:  No data recorded  [employer] of Knowledge:   Poor; Average   Intelligence:   Average   Abstraction:   Concrete   Judgement:   Fair   Reality Testing:   Variable; Distorted   Insight:   Gaps   Decision Making:   Vacilates; Impulsive   Social Functioning  Social Maturity:   Irresponsible; Impulsive   Social Judgement:   Naive; Heedless   Stress  Stressors:   Family conflict   Coping Ability:   Normal (UTA)   Skill Deficits:   Communication; Interpersonal; Self-control   Supports:   Family     Religion: Religion/Spirituality Are You A Religious Person?: Yes What is Your Religious Affiliation?:  (Unknown) How Might This Affect Treatment?: I need to find a church somewhere  Leisure/Recreation: Leisure / Recreation Do You Have Hobbies?: No  Exercise/Diet: Exercise/Diet Do You Exercise?: Yes What Type of Exercise Do You Do?: Run/Walk How Many Times a Week Do You Exercise?: 6-7 times a week Have You Gained or Lost A Significant Amount of Weight in the Past Six Months?: Yes-Lost ("I am trying to lose weight") Number of Pounds Lost?: 10 Do You Follow a Special Diet?: No Do You Have Any Trouble Sleeping?: No   CCA Employment/Education Employment/Work Situation: Employment / Work Systems developer: On disability Why is Patient on Disability: Mental Health How Long has Patient Been on Disability: Unknown Patient's Job has Been Impacted by Current Illness: No Has Patient ever Been in the [employer]?: No  Education: Education Is Patient Currently Attending School?: No Last Grade Completed:  (12) Did You Attend College?: Yes What Type of College Degree Do you Have?: "some college" Did You Have An Individualized Education Program (IIEP): No Did You Have Any Difficulty At  School?: No Patient's Education Has Been Impacted by Current Illness: No   CCA Family/Childhood History Family and Relationship History: Family history Marital status: Single Does patient have children?: No  Childhood History:  Childhood History By whom was/is the patient raised?: Both parents Did patient suffer any verbal/emotional/physical/sexual abuse as a child?: No Did patient suffer from severe childhood neglect?: No Has  patient ever been sexually abused/assaulted/raped as an adolescent or adult?: No Was the patient ever a victim of a crime or a disaster?: No Witnessed domestic violence?: No Has patient been affected by domestic violence as an adult?: No  Child/Adolescent Assessment:  N/a   CCA Substance Use Alcohol/Drug Use: Alcohol / Drug Use Pain Medications: see MAR Prescriptions: see MAR Over the Counter: see MAR History of alcohol / drug use?: No history of alcohol / drug abuse Longest period of sobriety (when/how long): none Negative Consequences of Use: Financial Withdrawal Symptoms: None     ASAM's:  Six Dimensions of Multidimensional Assessment  Dimension 1:  Acute Intoxication and/or Withdrawal Potential:   Dimension 1:  Description of individual's past and current experiences of substance use and withdrawal: NONE  Dimension 2:  Biomedical Conditions and Complications:      Dimension 3:  Emotional, Behavioral, or Cognitive Conditions and Complications:     Dimension 4:  Readiness to Change:     Dimension 5:  Relapse, Continued use, or Continued Problem Potential:     Dimension 6:  Recovery/Living Environment:     ASAM Severity Score: ASAM's Severity Rating Score: 0  ASAM Recommended Level of Treatment: ASAM Recommended Level of Treatment: Level I Outpatient Treatment   Substance use Disorder (SUD) Substance Use Disorder (SUD)  Checklist Symptoms of Substance Use:  (none)  Recommendations for Services/Supports/Treatments: Recommendations for  Services/Supports/Treatments Recommendations For Services/Supports/Treatments: Medication Management, Individual Therapy  Discharge Disposition:  Per Melbourne Abts pt does not meet inpatient criteria and can be discharged with outpatient resources  DSM5 Diagnoses: Patient Active Problem List   Diagnosis Date Noted   Catatonic schizophrenia (HCC) 10/19/2020   Catatonic state    Schizoaffective disorder, bipolar type (HCC)    Schizo-affective schizophrenia (HCC) 12/25/2019   Schizophrenia (HCC) 12/25/2019   Schizophrenia (HCC) 01/15/2019   Schizophrenia, paranoid (HCC) 04/20/2017     Referrals to Alternative Service(s): Referred to Alternative Service(s):   Place:   Date:   Time:    Referred to Alternative Service(s):   Place:   Date:   Time:    Referred to Alternative Service(s):   Place:   Date:   Time:    Referred to Alternative Service(s):   Place:   Date:   Time:     Ernest Haber Mazell Aylesworth, LCSW

## 2021-12-29 NOTE — ED Notes (Signed)
Police notified to transport patient home. Address verified for discharge home. ?

## 2021-12-29 NOTE — Discharge Instructions (Addendum)

## 2021-12-29 NOTE — ED Provider Notes (Signed)
Behavioral Health Urgent Care Medical Screening Exam ? ?Patient Name: Lindsey Richmond ?MRN: [id number] ?Date of Evaluation: 12/30/21 ?Chief Complaint:  "My mom is ready to kick me out of the house". ?Diagnosis:  ?Final diagnoses:  ?Schizophrenia, unspecified type ([place])  ? ? ?History of Present illness: Lindsey Richmond is a 26 y.o. female with past psychiatric history significant for schizophrenia who presents to the [facility] behavioral health urgent care [facility]) voluntarily via Event organiser.  Patient states that she called the police earlier this evening on 12/29/2021.  When patient is asked why she called the police earlier this evening, patient states "because my mom is ready to kick me out of the house.  My mom wants me to find a job, but I can't hold onto a job".  Patient then states "the cops were thinking about a group home".  Patient then states that she also called the police earlier tonight because she got into a verbal argument with her mother.  She denies getting into a physical altercation with her mother or making any threats towards her mother.  Patient denies any additional reasons as to why she called the police earlier this evening on 12/29/2021. ? ?Patient denies SI on exam.  She denies experiencing any SI recently over the past few weeks.  Patient denies history of any previous suicide attempts.  She denies history of self-injurious behavior via intentionally cutting or burning herself.  She denies HI.  She denies AVH or paranoia.  Patient describes her sleep as "decent" approximately 6 hours per night.  She denies anhedonia or feelings of guilt, hopelessness, or worthlessness.  She denies energy changes.  Denies concentration changes.  She denies appetite changes.  Patient does endorse some weight loss over the past 2 to 3 months but is unable to quantify how much weight she has lost over that time. ? ?Patient states that she does not have a psychiatrist or therapist currently and also states that  she does not want an outpatient psychiatrist or therapist.  When patient is asked if she takes any psychotropic medications, patient states "lorazepam and other meds that I don't know the name of".  However, when patient is asked who prescribes her psychotropic medications, patient states "no one else" and does not provide further details.  Per chart review, patient has previous psychotropic medication prescriptions of Cogentin 1 mg p.o. twice daily, Prolixin 10 mg p.o. twice daily, Prolixin 15 mg p.o. at bedtime, Ativan 2 mg p.o. twice daily, and trazodone 100 mg p.o. at bedtime from 10/06/2020 and documentation of prescription for Invega Sustenna 156 mg from 09/28/2020.Per PDMP review, patient's most recent Ativan/controlled substance prescription was for 30-day supply of 60 Ativan 0.5 mg tablets that were filled on 11/24/2020.  Additionally, aside from the February 2022 Ativan prescription noted above, per chart review, there does not appear to be any additional psychotropic medication prescriptions more recent than the additional January 2022/December 2021 psychotropic prescriptions noted above documented in her chart at this time.  Per chart review, patient was previously psychiatrically hospitalized at [facility]) from 09/28/2020 to 10/06/2020 and was discharged on the p.o. Cogentin, Prolixin, Ativan, and trazodone prescriptions as noted above.  Per chart review, patient has been admitted to [facility] on additional previous occasions in the past, which include admissions from 09/13/2020 to 09/24/2020, 01/05/2020 to 01/12/2020, 12/03/2019 to 12/09/2019, 10/22/2019 to 10/23/2019, 01/20/2019 to 01/28/2019, 01/14/2019 to 01/17/2019, 05/07/2017 to 05/17/2017, and 04/18/2017 is 04/25/2017.  Patient denies any additional inpatient psychiatric  hospitalizations since the 09/28/2020 to 10/06/2020 [facility] admission noted above. ? ?Patient lives in [place] with her mother and 4 year old brother.  Patient denies access to  firearms.  Patient denies alcohol, tobacco/nicotine, or illicit substance use.  Patient states she is unemployed at this time.  Patient states that her highest level of education is partial college completion.  Patient is asked about sources of social support, patient states "religion". ? ?Patient verbally contracts for safety with this provider. ? ?On exam, patient is sitting upright, fairly groomed, in no acute distress.  Eye contact is good.  Speech is clear and coherent with normal rate and volume.  Mood is euthymic with mood congruent affect.  Thought processes coherent, goal directed, and linear.  Patient is alert and oriented x4, pleasant, cooperative, and answers all questions appropriately throughout the evaluation.  Objectively, there is no indication of patient is responding to internal/external stimuli on exam.  Objectively, there is no delusional thought content elicited on exam. ? ?With patient's consent, TTS counselor (Christina Hussami, LCSW) obtained collateral information from patient's mother Mariene [id number] [phone]) by speaking with patient's mother via phone.  That obtained collateral information is shown below (as documented in [place], [place] 12/29/2021 TTS evaluation note): ? ?"Pt gave verbal consent to reach mother, [name] [phone]. Pts mother answered and stated that she is "getting tired of Nalia not wanting to do dishes in the home where she is living for free.".  Pts mother states that she (mom) uses a walker and is having a hard time taking care of her home and Shanquita isn't helping her.  Pts mother confirms that Chelsea did not make any suicidal or homicidal statements.  Pts mother did state "I am not able to pick her up so don't yall call me to get her because I can't"." ? ?Furthermore, per TTS counselor, patient's mother reported to TTS counselor during obtainment of collateral information that she did not have any concerns regarding the patient returning home at this  time. ? ?Psychiatric Specialty Exam ? ?Presentation  ?General Appearance:Appropriate for Environment; Fairly Groomed ? ?Eye Contact:Good ? ?Speech:Clear and Coherent; Normal Rate ? ?Speech Volume:Normal ? ?Handedness:Ambidextrous ? ? ?Mood and Affect  ?Mood:Euthymic ? ?Affect:Congruent ? ? ?Thought Process  ?Thought Processes:Coherent; Goal Directed; Linear ? ?Descriptions of Associations:Intact ? ?Orientation:Full (Time, Place and Person) ? ?Thought Content:Logical; WDL ? Diagnosis of Schizophrenia or Schizoaffective disorder in past: Yes ? Duration of Psychotic Symptoms: Greater than six months ? Hallucinations:None ?Jesus say you need to repent. ? ?Ideas of Reference:None ? ?Suicidal Thoughts:No ? ?Homicidal Thoughts:No ? ? ?Sensorium  ?Memory:Immediate Fair; Recent Fair; Remote Fair ? ?Judgment:Fair ? ?Insight:Fair ? ? ?Executive Functions  ?Concentration:Fair ? ?Attention Span:Fair ? ?Recall:Fair ? ?[place] ? ?Language:Good ? ? ?Psychomotor Activity  ?Psychomotor Activity:Normal ? ? ?Assets  ?Assets:Communication Skills; Desire for Improvement; Financial Resources/Insurance; Housing; Leisure Time; Physical Health; Social Support ? ? ?Sleep  ?Sleep:Fair ? ?Number of hours: 6 ? ? ?No data recorded ? ?Physical Exam: ?Physical Exam ?Vitals reviewed.  ?Constitutional:   ?   General: She is not in acute distress. ?   Appearance: She is not ill-appearing, toxic-appearing or diaphoretic.  ?HENT:  ?   Head: Normocephalic and atraumatic.  ?   Right Ear: External ear normal.  ?   Left Ear: External ear normal.  ?   Nose: Nose normal.  ?Eyes:  ?   General:     ?   Right eye: No discharge.     ?  Left eye: No discharge.  ?   Conjunctiva/sclera: Conjunctivae normal.  ?Cardiovascular:  ?   Rate and Rhythm: Normal rate.  ?Pulmonary:  ?   Effort: Pulmonary effort is normal. No respiratory distress.  ?Musculoskeletal:     ?   General: Normal range of motion.  ?   Cervical back: Normal range of motion.   ?Neurological:  ?   General: No focal deficit present.  ?   Mental Status: She is alert and oriented to person, place, and time.  ?   Comments: No tremor noted.   ?Psychiatric:     ?   Attention and Perception: At

## 2021-12-29 NOTE — Progress Notes (Signed)
?   12/29/21 1947  ?[place] Triage Screening (Walk-ins at [facility] only)  ?How Did You Hear About [place]? Legal System  ?What Is the Reason for Your Visit/Call Today? Lindsey Richmond is a 26 yo female transported to [facility] by [employer] for evaluation.  Pt states that she got into an argument with her mother "my mom said I need to get a job or get out".  Pt states that she got upset and called the police.  Pt reports that she resides with her mother and brother. Pt is denying any current SI, HI, or AVH.  Pt denies any substance use.  Pt is laughing/giggling at inappropriate times throughout assessment. Pt states that she is tired and just wants [place] to "find her a bed" and let her sleep here.  ?How Long Has This Been Causing You Problems? 1-6 months  ?Have You Recently Had Any Thoughts About Hurting Yourself? No  ?Are You Planning to Commit Suicide/Harm Yourself At This time? No  ?Have you Recently Had Thoughts About [place]? No  ?Are You Planning To Harm Someone At This Time? No  ?Are you currently experiencing any auditory, visual or other hallucinations? No  ?Have You Used Any Alcohol or Drugs in the Past 24 Hours? No  ?Do you have any current medical co-morbidities that require immediate attention? No  ?What Do You Feel Would Help You the Most Today? Treatment for Depression or other mood problem  ?If access to [facility] Urgent Care was not available, would you have sought care in the Emergency Department? Yes  ?Determination of Need Routine (7 days)  ?Options For Referral Medication Management;Outpatient Therapy  ? ? ?

## 2022-02-16 ENCOUNTER — Ambulatory Visit (HOSPITAL_COMMUNITY)
Admission: EM | Admit: 2022-02-16 | Discharge: 2022-02-16 | Disposition: A | Discharge status: Home / Self Care | Payer: Medicare Other | Attending: Behavioral Health | Admitting: Behavioral Health

## 2022-02-16 DIAGNOSIS — Z79899 Other long term (current) drug therapy: Secondary | ICD-10-CM | POA: Diagnosis not present

## 2022-02-16 DIAGNOSIS — Z5181 Encounter for therapeutic drug level monitoring: Secondary | ICD-10-CM | POA: Diagnosis present

## 2022-02-16 NOTE — ED Triage Notes (Signed)
Pt presents to [facility] accompanied by her mother voluntarily seeking a mental health evaluation. Pts mother states that the pt has been experiencing some behavior changes and may need her medication managed. Pt threw her wallet across the front desk upon walking into this facility, knocking over a sign, stating "I'm so sorry I don't know why I did that". Pt began to giggle innappropriately. Pts mother states that this is normal behavior for her when she is not on her medication. Pts mother states that the pt was being seen at [facility] but they discontinued services. Pt denies SI/HI and AVH.

## 2022-02-16 NOTE — ED Provider Notes (Signed)
Behavioral Health Urgent Care Medical Screening Exam  Patient Name: Lindsey Richmond MRN: [id number] Date of Evaluation: 02/16/22 Diagnosis:  Final diagnoses:  Medication management    History of Present illness: Lindsey Richmond is a 26 y.o. female patient who presented to the [facility] behavioral health urgent care voluntary accompanied by her mother Lindsey Richmond with a chief compliant of "patient has been experiencing some behavioral changes and need her medications managed."  Patient has a past psychiatric history significant for schizophrenia, paranoid type, schizoaffective disorder bipolar type, catatonic schizophrenia, and catatonic state.  Patient seen and evaluated face-to-face by this provider, and chart reviewed. On evaluation, patient is alert and oriented to person, place and time. Her thought process is logical and relevant. Her speech is clear and coherent at a decreased tone. Her mood is euthymic and affect is congruent. She is calm and cooperative throughout the assessment and answers questions appropriately.  Patient states that her mom brought her here because she thinks she has a psychotic disorder. She states that she does not know why her mom brought her here and that her parents think she has schizophrenia.  Patient denies suicidal or homicidal ideations. She denies self-injurious behaviors. She denies auditory and visual hallucinations. There is no objective evidence that the patient is currently responding to internal or external stimuli, or experiencing delusional or paranoid thought content.  Patient denies depressive symptoms. She states that she feels "struck" because she cannot go anywhere. She states that there is nowhere to go and that she does the same thing over and over, which she describes as going to the [place] [facility] and walking. She reports fair sleep and states that she is grateful for taking the advantage to sleep. She reports a poor appetite and states that she  eats a lot of snacks. She states that she stopped taking her medications because she is not psychotic. She denies experimenting with drugs or alcohol.  I spoke to the patient's mother face-to-face. Lindsey Richmond states that the patient ran out of her medications because she threw them in the trash. She states that the patient was discharged from [facility] outpatient services about 5 months ago. She states that [facility] did not say why the patient was discharged but sent her with a whole bunch of pills. She states that the patient still have some medication left and she was able to get the patient to take her medications this morning. She states that the patient was seen by her PCP at [place] but he did not refill the patient's medications. She provides a current list of the patient's home medications: Patient is prescribed Prolixin 10 mg p.o. twice daily, Prolixin 15 mg po nightly, Ativan 2 mg p.o. twice daily, trazodone 100 mg p.o. nightly and Cogentin 1 mg p.o. twice daily. She states that when the patient is off of her medications she starts saying "God this and to repent."   I discussed with the patient's mother Lindsey Richmond having the patient follow-up here at the [facility] outpatient for medication management. She was advised that the patient will need to present to open access on Mondays, Wednesdays, or Fridays to complete the initial evaluation. She was advised to arrive at 7:30 AM. Lindsey Richmond understanding and agrees to the stated plan.    Psychiatric Specialty Exam  Presentation  General Appearance:Appropriate for Environment  Eye Contact:Fair  Speech:Clear and Coherent  Speech Volume:Normal   Mood and Affect  Mood:Euthymic  Affect:Congruent   Thought Process  Thought  Processes:Coherent; Linear  Descriptions of Associations:Intact  Orientation:Full (Time, Place and Person)  Thought Content:Logical  Diagnosis of Schizophrenia or Schizoaffective disorder in past: Yes   Duration of Psychotic Symptoms: Greater than six months  Hallucinations:None Jesus say you need to repent.  Ideas of Reference:None  Suicidal Thoughts:No  Homicidal Thoughts:No   Sensorium  Memory:Immediate Fair; Remote Fair; Recent Fair  Judgment:Fair  Insight:Fair   Executive Functions  Concentration:Fair  Attention Span:Fair  Recall:Fair  Fund of Knowledge:Fair  Language:Fair   Psychomotor Activity  Psychomotor Activity:Normal   Assets  Assets:Communication Skills; Desire for Improvement; Financial Resources/Insurance; Housing; Leisure Time; Physical Health; Social Support   Sleep  Sleep:Fair  Number of hours: 6   No data recorded  Physical Exam: Physical Exam HENT:     Head: Normocephalic.     Nose: Nose normal.  Cardiovascular:     Rate and Rhythm: Normal rate.  Pulmonary:     Effort: Pulmonary effort is normal.  Musculoskeletal:     Cervical back: Normal range of motion.  Neurological:     Mental Status: She is alert and oriented to person, place, and time.   Review of Systems  Constitutional: Negative.   HENT: Negative.    Eyes: Negative.   Respiratory: Negative.    Cardiovascular: Negative.   Gastrointestinal: Negative.   Genitourinary: Negative.   Musculoskeletal: Negative.   Skin: Negative.   Neurological: Negative.   Endo/Heme/Allergies: Negative.   Blood pressure (!) 143/74, pulse 100, temperature 98.3 F (36.8 C), temperature source Oral, resp. rate 18, SpO2 98 %. There is no height or weight on file to calculate BMI.  Musculoskeletal: Strength & Muscle Tone: within normal limits Gait & Station: normal Patient leans: N/A   [facility] MSE Discharge Disposition for Follow up and Recommendations: Based on my evaluation the patient does not appear to have an emergency medical condition and can be discharged with resources and follow up care in outpatient services for Medication Management  Discharge recommendations:  Patient is  to take medications as prescribed. Please see information for follow-up appointment with psychiatry and therapy. Please follow up with your primary care provider for all medical related needs.   Therapy: We recommend that patient participate in individual therapy to address mental health concerns.  Medications: The parent/guardian is to contact a medical professional and/or outpatient provider to address any new side effects that develop. Parent/guardian should update outpatient providers of any new medications and/or medication changes.   Atypical antipsychotics: If you are prescribed an atypical antipsychotic, it is recommended that your height, weight, BMI, blood pressure, fasting lipid panel, and fasting blood sugar be monitored by your outpatient providers.  Safety:  The patient should abstain from use of illicit substances/drugs and abuse of any medications. If symptoms worsen or do not continue to improve or if the patient becomes actively suicidal or homicidal then it is recommended that the patient return to the closest hospital emergency department, the [facility], or call 911 for further evaluation and treatment. National Suicide Prevention Lifeline 1-800-SUICIDE or 647-404-0667.  About 988 988 offers 24/7 access to trained crisis counselors who can help people experiencing mental health-related distress. People can call or text 988 or chat 988lifeline.org for themselves or if they are worried about a loved one who may need crisis support.     Follow-up Information     Go to  [facility].   Specialty: Urgent Care Why: Walk-in hours for open access for psychiatry are  Mondays, Wednesdays and Fridays 8 am to 11 pm. Please arrive at 7:30 am. Contact information: [place]        Call  Bruce.   Specialty: Professional Counselor Why: Schedule  a new patient appointment with psychiatry for medication mangement. Contact information: [facility] [address] [phone]                 Marissa Calamity, NP 02/16/2022, 5:06 PM

## 2022-02-16 NOTE — Discharge Instructions (Addendum)

## 2023-03-07 ENCOUNTER — Other Ambulatory Visit: Payer: Self-pay | Admitting: Internal Medicine

## 2023-03-08 LAB — CBC
HCT: 39.6 % (ref 35.0–45.0)
Hemoglobin: 13 g/dL (ref 11.7–15.5)
MCH: 30.4 pg (ref 27.0–33.0)
MCHC: 32.8 g/dL (ref 32.0–36.0)
MCV: 92.7 fL (ref 80.0–100.0)
MPV: 10.2 fL (ref 7.5–12.5)
Platelets: 296 10*3/uL (ref 140–400)
RBC: 4.27 10*6/uL (ref 3.80–5.10)
RDW: 12.6 % (ref 11.0–15.0)
WBC: 6.3 10*3/uL (ref 3.8–10.8)

## 2023-03-08 LAB — LIPID PANEL
Cholesterol: 144 mg/dL (ref <200)
HDL: 35 mg/dL — ABNORMAL LOW (ref >=50)
LDL Cholesterol (Calc): 94 mg/dL (calc)
Non-HDL Cholesterol (Calc): 109 mg/dL (calc) (ref <130)
Total CHOL/HDL Ratio: 4.1 (calc) (ref <5.0)
Triglycerides: 61 mg/dL (ref <150)

## 2023-03-08 LAB — COMPLETE METABOLIC PANEL WITH GFR
AG Ratio: 1 (calc) (ref 1.0–2.5)
ALT: 16 U/L (ref 6–29)
AST: 18 U/L (ref 10–30)
Albumin: 4 g/dL (ref 3.6–5.1)
Alkaline phosphatase (APISO): 57 U/L (ref 31–125)
BUN: 13 mg/dL (ref 7–25)
CO2: 24 mmol/L (ref 20–32)
Calcium: 9.1 mg/dL (ref 8.6–10.2)
Chloride: 101 mmol/L (ref 98–110)
Creat: 0.82 mg/dL (ref 0.50–0.96)
Globulin: 4.2 g/dL (calc) — ABNORMAL HIGH (ref 1.9–3.7)
Glucose, Bld: 76 mg/dL (ref 65–99)
Potassium: 3.8 mmol/L (ref 3.5–5.3)
Sodium: 137 mmol/L (ref 135–146)
Total Bilirubin: 0.3 mg/dL (ref 0.2–1.2)
Total Protein: 8.2 g/dL — ABNORMAL HIGH (ref 6.1–8.1)
eGFR: 102 mL/min/{1.73_m2} (ref >=60)

## 2023-03-08 LAB — TSH: TSH: 2.28 mIU/L

## 2023-03-08 LAB — VITAMIN D 25 HYDROXY (VIT D DEFICIENCY, FRACTURES): Vit D, 25-Hydroxy: 38 ng/mL (ref 30–100)

## 2023-07-26 ENCOUNTER — Other Ambulatory Visit: Payer: Self-pay

## 2023-07-26 ENCOUNTER — Encounter (HOSPITAL_BASED_OUTPATIENT_CLINIC_OR_DEPARTMENT_OTHER): Payer: Self-pay | Admitting: *Deleted

## 2023-07-26 ENCOUNTER — Emergency Department (HOSPITAL_BASED_OUTPATIENT_CLINIC_OR_DEPARTMENT_OTHER)
Admission: EM | Admit: 2023-07-26 | Discharge: 2023-07-27 | Disposition: A | Discharge status: Other Acute Inpatient Hospital | Payer: Medicare Other | Attending: Emergency Medicine | Admitting: Emergency Medicine

## 2023-07-26 DIAGNOSIS — F203 Undifferentiated schizophrenia: Secondary | ICD-10-CM | POA: Diagnosis present

## 2023-07-26 LAB — CBC WITH DIFFERENTIAL/PLATELET
Abs Immature Granulocytes: 0.01 10*3/uL (ref 0.00–0.07)
Basophils Absolute: 0 10*3/uL (ref 0.0–0.1)
Basophils Relative: 0 %
Eosinophils Absolute: 0.2 10*3/uL (ref 0.0–0.5)
Eosinophils Relative: 3 %
HCT: 39.5 % (ref 36.0–46.0)
Hemoglobin: 13 g/dL (ref 12.0–15.0)
Immature Granulocytes: 0 %
Lymphocytes Relative: 35 %
Lymphs Abs: 2.5 10*3/uL (ref 0.7–4.0)
MCH: 30.4 pg (ref 26.0–34.0)
MCHC: 32.9 g/dL (ref 30.0–36.0)
MCV: 92.5 fL (ref 80.0–100.0)
Monocytes Absolute: 0.5 10*3/uL (ref 0.1–1.0)
Monocytes Relative: 7 %
Neutro Abs: 3.8 10*3/uL (ref 1.7–7.7)
Neutrophils Relative %: 55 %
Platelets: 262 10*3/uL (ref 150–400)
RBC: 4.27 MIL/uL (ref 3.87–5.11)
RDW: 12.7 % (ref 11.5–15.5)
WBC: 7.1 10*3/uL (ref 4.0–10.5)
nRBC: 0 % (ref 0.0–0.2)

## 2023-07-26 LAB — RAPID URINE DRUG SCREEN, HOSP PERFORMED
Amphetamines: NOT DETECTED
Barbiturates: NOT DETECTED
Benzodiazepines: NOT DETECTED
Cocaine: NOT DETECTED
Opiates: NOT DETECTED
Tetrahydrocannabinol: NOT DETECTED

## 2023-07-26 LAB — URINALYSIS, ROUTINE W REFLEX MICROSCOPIC
Bacteria, UA: NONE SEEN
Bilirubin Urine: NEGATIVE
Glucose, UA: NEGATIVE mg/dL
Hgb urine dipstick: NEGATIVE
Ketones, ur: 15 mg/dL — AB
Nitrite: NEGATIVE
Protein, ur: 30 mg/dL — AB
Specific Gravity, Urine: 1.038 — ABNORMAL HIGH (ref 1.005–1.030)
pH: 5.5 (ref 5.0–8.0)

## 2023-07-26 LAB — COMPREHENSIVE METABOLIC PANEL
ALT: 16 U/L (ref 0–44)
AST: 17 U/L (ref 15–41)
Albumin: 4.1 g/dL (ref 3.5–5.0)
Alkaline Phosphatase: 48 U/L (ref 38–126)
Anion gap: 9 (ref 5–15)
BUN: 15 mg/dL (ref 6–20)
CO2: 27 mmol/L (ref 22–32)
Calcium: 9.7 mg/dL (ref 8.9–10.3)
Chloride: 103 mmol/L (ref 98–111)
Creatinine, Ser: 0.87 mg/dL (ref 0.44–1.00)
GFR, Estimated: >60 mL/min (ref >60)
Glucose, Bld: 74 mg/dL (ref 70–99)
Potassium: 3.6 mmol/L (ref 3.5–5.1)
Sodium: 139 mmol/L (ref 135–145)
Total Bilirubin: 0.8 mg/dL (ref 0.3–1.2)
Total Protein: 9.5 g/dL — ABNORMAL HIGH (ref 6.5–8.1)

## 2023-07-26 LAB — TSH: TSH: 2.488 u[IU]/mL (ref 0.350–4.500)

## 2023-07-26 LAB — SALICYLATE LEVEL: Salicylate Lvl: <7 mg/dL — ABNORMAL LOW (ref 7.0–30.0)

## 2023-07-26 LAB — ACETAMINOPHEN LEVEL: Acetaminophen (Tylenol), Serum: <10 ug/mL — ABNORMAL LOW (ref 10–30)

## 2023-07-26 LAB — ETHANOL: Alcohol, Ethyl (B): <10 mg/dL (ref <10)

## 2023-07-26 LAB — PREGNANCY, URINE: Preg Test, Ur: NEGATIVE

## 2023-07-26 NOTE — ED Notes (Signed)
IVC paperwork has been submitted and accepted at 837pm by Magistrate.

## 2023-07-26 NOTE — Progress Notes (Signed)
Patient has been denied by [facility] due to no appropriate beds available. Patient meets BH inpatient criteria per Sindy Guadeloupe, NP. Patient has been faxed out to the following facilities:   [facility]  [address] [place] [place] [address] [phone]  [facility]  [address] [place] [address] [phone]  [facility]  [address] [place] [address] [phone]  [facility]  [address] [place] [address] [phone]  [facility]  [address] [place] [address] [phone]  [facility]  [address] [place] [address] [phone]  [facility]  [address], [place] [address] [phone]  [facility]  [address] [place] [address] [phone]  [facility]  [address] [place] [address] [phone]  [facility] Patient Placement  [facility], [place] [phone]  [facility]  [address] [place], [place] [address] [phone]  [facility]  [address] [place], [place] [address] [phone]  [facility]  [address] [place] [address] [phone]  [facility]  [address], [place] [address] [phone]  [facility]  [address] [place] [address] [phone]   Damita Dunnings, MSW, LCSW-A  11:15 PM 07/26/2023

## 2023-07-26 NOTE — ED Notes (Signed)
Pt provided mac and cheese frozen dinner, popcorn and ice water.

## 2023-07-26 NOTE — ED Notes (Signed)
TTS talking to patient at this time. 

## 2023-07-26 NOTE — ED Triage Notes (Signed)
Pt transferred from [facility] via [employer]; Pt IVC'd by Delila Spence PA at [facility]; IVC papers has been transferred and on chart in [place]. Report accepted by Doylene Canard, RN; Pt awake and sitting on side of the bed on arrival; Charge RN notified that patient has arrived [facility]

## 2023-07-26 NOTE — ED Notes (Signed)
Pt is wandering the hall refused to go back into the room, Security notified

## 2023-07-26 NOTE — BH Assessment (Addendum)
Comprehensive Clinical Assessment (CCA) Note  07/26/2023 Lindsey Richmond [id number] Disposition: Clinician discussed patient care with Lindsey Guadeloupe, NP who recommended inpatient care to stabilize patient.  Clinician informed PA Lindsey Richmond of disposition recommendation via secure messaging.  Pt is not able to give coherent answers consistently.  She is a poor historian and will not completely answer questions asked.  She appears to be responding to internal stimuli.  Patient is oriented x3.  She has fair eye contact.  She reports "sleeping too much" and feels like she does not need her medications.  She has poor judgement.  Pt reports not eating well either.    Pt says she has Dr. Jannifer Richmond prescribing medications.     Chief Complaint:  Chief Complaint  Patient presents with   Mental Health Problem   Visit Diagnosis: Schizophrenia    CCA Screening, Triage and Referral (STR)  Patient Reported Information How did you hear about [place]? Family/Friend  What Is the Reason for Your Visit/Call Today? Pt was brought to [facility] by brother.  Pt has been off her medications for an unknown amount of time.  She says "I don't need it."  Pt denies SI to this clinician.  She is also denying any HI or A/V hallucinations.  Patient denies use of ETOH, THC.  She has been not eating she says.  When asked if she sleeps okay she says "I sleep a lot and I don't take responsibility for it."  Patient earlier was overhead by EMT Lindsey Richmond to be saying "I want to kill myself."  Patient has been placed on IVC by the EDP.  Patient is not oriented to time.  She is oriented to location and person.  She is slow to respond to questions and appears to be responding to internal stimuli at times.  She smiles or laughs at inappropriate times.  Pt is not a good source of information at this time.  This clinician did attempt to call both the brother and the mother but there was no answer.  How Long Has This Been Causing You  Problems? 1 wk - 1 month  What Do You Feel Would Help You the Most Today? Treatment for Depression or other mood problem; Medication(s)   Have You Recently Had Any Thoughts About Hurting Yourself? Yes  Are You Planning to Commit Suicide/Harm Yourself At This time? No   Flowsheet Row ED from 07/26/2023 in [facility] Emergency Department at [facility] ED from 12/29/2021 in [facility] ED from 12/19/2021 in [facility] Emergency Department at [facility]  C-SSRS RISK CATEGORY No Risk No Risk No Risk       Have you Recently Had Thoughts About Hurting Someone Lindsey Richmond? No  Are You Planning to Harm Someone at This Time? No  Explanation: Pt has been heard muttering "I want to kill myself."  She denies SI and HI to this clinician.   Have You Used Any Alcohol or Drugs in the Past 24 Hours? No  What Did You Use and How Much? None   Do You Currently Have a Therapist/Psychiatrist? Yes  Name of Therapist/Psychiatrist: Name of Therapist/Psychiatrist: Pt said she sees Dr. Jannifer Richmond for medication managment.   Have You Been Recently Discharged From Any Office Practice or Programs? No  Explanation of Discharge From Practice/Program: No known recent discharges     CCA Screening Triage Referral Assessment Type of Contact: Tele-Assessment  Telemedicine Service Delivery:   Is this Initial or Reassessment? Is this Initial  or Reassessment?: Initial Assessment  Date Telepsych consult ordered in [facility]:  Date Telepsych consult ordered in [facility]: 07/26/23  Time Telepsych consult ordered in [facility]:  Time Telepsych consult ordered in [facility]: 1418  Location of Assessment: Other (comment) ([facility])  Provider Location: [facility] Assessment Services   Collateral Involvement: Attempted to call mother Lindsey Richmond and brother Lindsey Richmond and got no answer.   Does Patient Have a Automotive engineer Guardian? No  Legal Guardian Contact Information: Pt has no legal  guardian  Copy of Legal Guardianship Form: -- (Pt has no legal guardian)  Legal Guardian Notified of Arrival: -- (Pt has no legal guardian)  Legal Guardian Notified of Pending Discharge: -- (Pt has no legal guardian)  If Minor and Not Living with Parent(s), Who has Custody? Pt is an adult.  Is CPS involved or ever been involved? Never  Is APS involved or ever been involved? Never   Patient Determined To Be At Risk for Harm To Self or Others Based on Review of Patient Reported Information or Presenting Complaint? Yes, for Self-Harm  Method: No Plan  Availability of Means: No access or NA  Intent: Vague intent or NA  Notification Required: No need or identified person  Additional Information for Danger to Others Potential: Active psychosis  Additional Comments for Danger to Others Potential: Pt denies HI.  Are There Guns or Other Weapons in Your Home? No  Types of Guns/Weapons: No weapons reported  Are These Weapons Safely Secured?                            No  Who Could Verify You Are Able To Have These Secured: Unknown  Do You Have any Outstanding Charges, Pending Court Dates, Parole/Probation? No charges  Contacted To Inform of Risk of Harm To Self or Others: Other: Comment (Brother brought her to [facility])    Does Patient Present under Involuntary Commitment? Yes    [place] of Residence: [place]   Patient Currently Receiving the Following Services: Medication Management   Determination of Need: Urgent (48 hours)   Options For Referral: Inpatient Hospitalization     CCA Biopsychosocial Patient Reported Schizophrenia/Schizoaffective Diagnosis in Past: Yes   Strengths: Pt says she is good at writing, drawing and math.   Mental Health Symptoms Depression:   Increase/decrease in appetite; Hopelessness; Change in energy/activity   Duration of Depressive symptoms:  Duration of Depressive Symptoms: Greater than two weeks   Mania:   None    Anxiety:    N/A   Psychosis:   Grossly disorganized or catatonic behavior; Grossly disorganized speech; Other negative symptoms   Duration of Psychotic symptoms:  Duration of Psychotic Symptoms: Greater than six months   Trauma:   Detachment from others   Obsessions:   N/A   Compulsions:   N/A   Inattention:   N/A   Hyperactivity/Impulsivity:   N/A   Oppositional/Defiant Behaviors:   Defies rules   Emotional Irregularity:   Mood lability   Other Mood/Personality Symptoms:   Schizophrenia    Mental Status Exam Appearance and self-care  Stature:   Average   Weight:   Obese   Clothing:   Disheveled   Grooming:   Neglected   Cosmetic use:   None   Posture/gait:   Normal   Motor activity:   Not Remarkable   Sensorium  Attention:   Normal   Concentration:   Anxiety interferes   Orientation:   Person; Place;  Object   Recall/memory:   Defective in Immediate; Defective in Short-term   Affect and Mood  Affect:   Inappropriate (Smiling or laughing inappropriately.)   Mood:   Depressed; Other (Comment) (Pt has schizophrenia and has been w/o medication.)   Relating  Eye contact:   Normal   Facial expression:   Sad   Attitude toward examiner:   Silly; Passive   Thought and Language  Speech flow:  Flight of Ideas; Slow; Soft   Thought content:   Appropriate to Mood and Circumstances   Preoccupation:   Other (Comment) (UTA)   Hallucinations:   Other (Comment) (Denies but appears to be responding to internal stimuli.)   Organization:   Insurance underwriter of Knowledge:   Poor   Intelligence:   Average   Abstraction:   Concrete   Judgement:   Poor   Reality Testing:   Distorted; Variable   Insight:   Poor   Decision Making:   Impulsive   Social Functioning  Social Maturity:   Irresponsible; Impulsive   Social Judgement:   Naive; Heedless   Stress  Stressors:   Family conflict;  Grief/losses   Coping Ability:   Exhausted; Overwhelmed   Skill Deficits:   Communication; Responsibility; Interpersonal   Supports:   Family     Religion: Religion/Spirituality Are You A Religious Person?: No How Might This Affect Treatment?: Pt denies being a religious person  Leisure/Recreation: Leisure / Recreation Do You Have Hobbies?: Yes Leisure and Hobbies: Writing and drawing  Exercise/Diet: Exercise/Diet Do You Exercise?: Yes What Type of Exercise Do You Do?: Run/Walk How Many Times a Week Do You Exercise?: 6-7 times a week Have You Gained or Lost A Significant Amount of Weight in the Past Six Months?: No Do You Follow a Special Diet?: No Do You Have Any Trouble Sleeping?: No   CCA Employment/Education Employment/Work Situation: Employment / Work Systems developer: On disability Why is Patient on Disability: Mental Health How Long has Patient Been on Disability: Pt says "Since I was 14." Patient's Job has Been Impacted by Current Illness: No Has Patient ever Been in the [employer]?: No  Education: Education Is Patient Currently Attending School?: No Last Grade Completed: 12 Did You Attend College?: Yes What Type of College Degree Do you Have?: "some college" Did You Have An Individualized Education Program (IIEP): No Did You Have Any Difficulty At School?: No Patient's Education Has Been Impacted by Current Illness: No   CCA Family/Childhood History Family and Relationship History: Family history Marital status: Single Does patient have children?: No  Childhood History:  Childhood History By whom was/is the patient raised?: Both parents Did patient suffer any verbal/emotional/physical/sexual abuse as a child?: No Did patient suffer from severe childhood neglect?: No Has patient ever been sexually abused/assaulted/raped as an adolescent or adult?: No Was the patient ever a victim of a crime or a disaster?: No Witnessed domestic  violence?: No Has patient been affected by domestic violence as an adult?: No       CCA Substance Use Alcohol/Drug Use:                           ASAM's:  Six Dimensions of Multidimensional Assessment  Dimension 1:  Acute Intoxication and/or Withdrawal Potential:      Dimension 2:  Biomedical Conditions and Complications:      Dimension 3:  Emotional, Behavioral, or Cognitive Conditions and Complications:  Dimension 4:  Readiness to Change:     Dimension 5:  Relapse, Continued use, or Continued Problem Potential:     Dimension 6:  Recovery/Living Environment:     ASAM Severity Score:    ASAM Recommended Level of Treatment:     Substance use Disorder (SUD)    Recommendations for Services/Supports/Treatments:    Discharge Disposition:    DSM5 Diagnoses: Patient Active Problem List   Diagnosis Date Noted   Catatonic schizophrenia (HCC) 10/19/2020   Catatonic state    Schizoaffective disorder, bipolar type (HCC)    Schizo-affective schizophrenia (HCC) 12/25/2019   Schizophrenia (HCC) 12/25/2019   Schizophrenia (HCC) 01/15/2019   Schizophrenia, paranoid (HCC) 04/20/2017     Referrals to Alternative Service(s): Referred to Alternative Service(s):   Place:   Date:   Time:    Referred to Alternative Service(s):   Place:   Date:   Time:    Referred to Alternative Service(s):   Place:   Date:   Time:    Referred to Alternative Service(s):   Place:   Date:   Time:     Wandra Mannan

## 2023-07-26 NOTE — ED Notes (Signed)
Pt uncooperative with scrub change. Multiple attempts to encourage pt to change clothing. Reinforced reasoning behind change. Pt asks for staff to leave several times- does not change when staff does. Security and two techs enter room with this RN to encourage changing. Pt complies at this time. Phone turned off and placed in security folder. Given to security. Belongings gathered in bag and placed at nurse's station. Family asked to leave at this time. Phone numbers in chart are up to date. Refusing to provide UA sample at this time. Avoidant eye contact. Smiling at inappropriate times. No aggressive posturing noted. Expresses need for water- given. Tech remains at door as sitter in view of patient.

## 2023-07-26 NOTE — ED Notes (Signed)
Spoke with lab to add on TSH

## 2023-07-26 NOTE — ED Notes (Signed)
Pt changing to paper scrubs.

## 2023-07-26 NOTE — ED Notes (Addendum)
Patient has been muttering to herself and stated " I want to kill myself"  and  " I need help".  This tech asked pt what she needed help with and patient did not verbally respond but laughed.   This tech advised RN.

## 2023-07-26 NOTE — ED Provider Notes (Addendum)
[place] EMERGENCY DEPARTMENT AT [facility] Provider Note   CSN: [id number] Arrival date & time: 07/26/23  1331     History  Chief Complaint  Patient presents with   Mental Health Problem    Lindsey Richmond is a 27 y.o. female history of schizophrenia, catatonic state having SI, depression presented with mental health issues over the past few months.  Patient was unwilling to answer questions and so brother answered questions for sister.  Brother states that their father recently died a few days ago and patient has become increasingly more reclusive and agitative at home.  Patient is not been taking her benztropine and trazodone at home for the past few months as she states "I do not need it."  Patient states that she hears voices daily that tell her that she is worthless and to harm herself.  Patient states that she does have suicidal ideation but denies any sort of plan.  Patient denies any previous attempts at taking her life or harming others.  Brother states that they were at an outpatient office when they were told to go to the [facility] to have patient's psych meds reevaluated.  Patient does have history of asthma however has not been using her albuterol inhaler at home as she states she has not been eating and other confirms.  Patient denies taking any drugs or illicit drugs at home including alcohol, Tylenol, ibuprofen.  ROS was unable to be obtained fully as patient was unable to fully answer questions  Home Medications Prior to Admission medications   Medication Sig Start Date End Date Taking? Authorizing Provider  benztropine (COGENTIN) 1 MG tablet Take 1 tablet (1 mg total) by mouth 2 (two) times daily. For prevention of tremors Patient not taking: Reported on 07/26/2023 10/06/20   Armandina Stammer I, NP  doxycycline (VIBRAMYCIN) 100 MG capsule Take 100 mg by mouth 2 (two) times daily. Patient not taking: Reported on 07/26/2023 12/15/21   [provider]   fluPHENAZine (PROLIXIN) 10 MG tablet Take 1 tablet (10 mg total) by mouth 2 (two) times daily. For mood control Patient not taking: Reported on 07/26/2023 10/06/20   Armandina Stammer I, NP  fluPHENAZine (PROLIXIN) 5 MG tablet Take 3 tablets (15 mg total) by mouth at bedtime. Patient not taking: Reported on 07/26/2023 10/06/20   Armandina Stammer I, NP  LORazepam (ATIVAN) 2 MG tablet Take 1 tablet (2 mg total) by mouth 2 (two) times daily. For severe anxiety Patient not taking: Reported on 07/26/2023 10/06/20   Armandina Stammer I, NP  paliperidone (INVEGA SUSTENNA) 156 MG/ML SUSY injection INJECT AS DIRECTED INPATIENT ON 09/29/20 09/28/20 09/28/21  Antonieta Pert, MD  traZODone (DESYREL) 100 MG tablet Take 1 tablet (100 mg total) by mouth at bedtime. For sleep Patient not taking: Reported on 07/26/2023 10/06/20   Armandina Stammer I, NP      Allergies    Patient has no known allergies.    Review of Systems   Review of Systems  Physical Exam Updated Vital Signs BP 125/89 (BP Location: Left Arm)   Pulse 70   Temp 98.3 F (36.8 C) (Oral)   Resp 17   SpO2 100%  Physical Exam Vitals reviewed.  Constitutional:      General: She is not in acute distress. HENT:     Head: Normocephalic and atraumatic.  Eyes:     Extraocular Movements: Extraocular movements intact.     Conjunctiva/sclera: Conjunctivae normal.     Pupils: Pupils are equal,  round, and reactive to light.  Cardiovascular:     Rate and Rhythm: Normal rate and regular rhythm.     Pulses: Normal pulses.     Heart sounds: Normal heart sounds.     Comments: 2+ bilateral radial/dorsalis pedis pulses with regular rate Pulmonary:     Effort: Pulmonary effort is normal. No respiratory distress.     Breath sounds: Normal breath sounds.  Abdominal:     Palpations: Abdomen is soft.     Tenderness: There is no abdominal tenderness. There is no guarding or rebound.  Musculoskeletal:        General: Normal range of motion.     Cervical back: Normal  range of motion and neck supple.     Comments: 5 out of 5 bilateral grip/leg extension strength  Skin:    General: Skin is warm and dry.     Capillary Refill: Capillary refill takes less than 2 seconds.  Neurological:     General: No focal deficit present.     Mental Status: She is alert.     Comments: Sensation intact in all 4 limbs Alert and oriented x 3 to person, year, color; believes she is at the behavioral urgent care currently  Psychiatric:     Comments: Patient does have tangential speech on exam and tends to start of sentences with a thought however midway through the sentence will stop abruptly and be unable to continue the thought or sentence Does not answer many questions as she gives answers that are not appropriate in the setting of the question     ED Results / Procedures / Treatments   Labs (all labs ordered are listed, but only abnormal results are displayed) Labs Reviewed  COMPREHENSIVE METABOLIC PANEL - Abnormal; Notable for the following components:      Result Value   Total Protein 9.5 (*)    All other components within normal limits  ACETAMINOPHEN LEVEL - Abnormal; Notable for the following components:   Acetaminophen (Tylenol), Serum <10 (*)    All other components within normal limits  SALICYLATE LEVEL - Abnormal; Notable for the following components:   Salicylate Lvl <7.0 (*)    All other components within normal limits  URINALYSIS, ROUTINE W REFLEX MICROSCOPIC - Abnormal; Notable for the following components:   Specific Gravity, Urine 1.038 (*)    Ketones, ur 15 (*)    Protein, ur 30 (*)    Leukocytes,Ua SMALL (*)    All other components within normal limits  ETHANOL  RAPID URINE DRUG SCREEN, HOSP PERFORMED  CBC WITH DIFFERENTIAL/PLATELET  PREGNANCY, URINE  TSH    EKG None  Radiology No results found.  Procedures Procedures    Medications Ordered in ED Medications - No data to display  ED Course/ Medical Decision Making/ A&P                                  Medical Decision Making Amount and/or Complexity of Data Reviewed Labs: ordered.   [name] 27 y.o. presented today for psych evaluation. Working DDx that I considered at this time includes, but not limited to, schizophrenia, primary psychosis, substance-induced psychosis, mood disturbance, SI/HI, UTI, hyper/hypothyroidism, medication intoxication.  R/o DDx: substance-induced psychosis, mood disturbance, SI/HI, UTI, hyper/hypothyroidism, medication intoxication: These are considered less likely due to history of present illness, physical exam, labs/imaging findings  Review of prior external notes: 02/16/2022 ED  Unique Tests and My Interpretation:  CBC: Unremarkable CMP: Unremarkable UA: Unremarkable Urine pregnancy: Negative UDS: Unremarkable Ethanol: Unremarkable APAP: Unremarkable Salicylates: Unremarkable EKG: Sinus 71 bpm, no ST elevations or depressions noted, no blocks noted LKG:[id number]  Social Determinants of Health: none  Discussion with Independent Historian:  Brother  Discussion of Management of Tests:  TTS  Risk: High: hospitalization or escalation of hospital-level care  Risk Stratification Score: None  Plan: Patient presented for psychiatric evaluation.  Patient is endorsing SI along with auditory hallucinations.  Patient does have a history of schizophrenia for which they are on Cogentin and trazodone.  They are not compliant with their medications. On my initial exam, the pt was having non-linear  thought, having tangential speech, unable to finish sentences and not giving appropriate answers to the the setting of the questions asked.  Vital signs reviewed and reassuring. With the patient's presentation of psychosis/schizophrenia, patient warrants emergent psychiatric consultation.   Patient immediately placed into ED psychiatric hold protocol including suicide precautions, elopement precautions and vital sign monitoring. TTS  consulted for further evaluation once patient medically cleared. Medical screening evaluation ordered and reviewed with no obvious medical reason to postpone psychiatric evaluation.  IVC paperwork was filled out as patient states that she does not feel she needs to be here and given her physical exam being concerning for acute psychosis and having passive SI will fill out IVC paperwork.  Currently still waiting for IVC paperwork to be notarized. Labs are reassuring and unremarkable.  Patient is remained stable here with stable vitals.  At this time patient is stable to be evaluated by psych as she is medically cleared.  Psych is recommending inpatient psychiatric care.  This chart was dictated using voice recognition software.  Despite best efforts to proofread,  errors can occur which can change the documentation meaning.         Final Clinical Impression(s) / ED Diagnoses Final diagnoses:  Undifferentiated schizophrenia [facility])    Rx / DC Orders ED Discharge Orders     None         Remi Deter 07/26/23 2218    Vanetta Mulders, MD 07/27/23 [phone]

## 2023-07-26 NOTE — ED Notes (Addendum)
Patient attempting to give urine sample, ambulatory to bathroom.

## 2023-07-26 NOTE — ED Notes (Signed)
Patient changed into paper scrubs, mesh underwear, and hospital socks.

## 2023-07-26 NOTE — Progress Notes (Signed)
[facility] LCSW Progress Note   07/26/2023    11:57 PM  Lindsey Richmond   [id number]   Type of Contact and Topic:  Psychiatric Bed Placement   Pt accepted to [facility]    Patient meets inpatient criteria per Sindy Guadeloupe, NP  The attending provider will be Dr. Loni Beckwith   Call report to [phone]  Adonis Brook, RN @ [facility] ED notified.     Pt scheduled  to arrive at [facility] AFTER 0900.    Damita Dunnings, MSW, LCSW-A  11:59 PM 07/26/2023

## 2023-07-26 NOTE — ED Notes (Signed)
TTS call is complete.

## 2023-07-26 NOTE — ED Triage Notes (Signed)
Pt with hx of schizophrenia is brought in by her brother due to "mental health problem".  Pt has not been on any of her medications for several months (they are unsure how long).  Pt states that she needs help and support but denies any SI or HI at this time.

## 2023-07-26 NOTE — ED Notes (Signed)
Pt lying in bed, no needs at this time, toileting offered several times, food and drinks offered. Pt is calm and content with no complaints at this time.

## 2023-07-26 NOTE — ED Notes (Signed)
[facility] arrives to transport patient. Cooperative with transport. Sent with IVC papers, facesheet, [facility]link packet, and inventory sheet.

## 2023-07-26 NOTE — ED Notes (Signed)
-  Called [place] [facility] for transportation to [facility] ED, Dr. Chelsea Aus accepting.

## 2023-07-27 DIAGNOSIS — F203 Undifferentiated schizophrenia: Secondary | ICD-10-CM | POA: Diagnosis not present

## 2023-07-27 MED ORDER — LORAZEPAM 1 MG PO TABS
2.0000 mg | ORAL_TABLET | Freq: Two times a day (BID) | ORAL | Status: DC | PRN
  Administered 2023-07-27: 2 mg via ORAL
  Filled 2023-07-27: qty 2

## 2023-07-27 MED ORDER — LORAZEPAM 1 MG PO TABS
1.0000 mg | ORAL_TABLET | Freq: Once | ORAL | Status: AC
  Administered 2023-07-27: 1 mg via ORAL
  Filled 2023-07-27: qty 1

## 2023-07-27 MED ORDER — STERILE WATER FOR INJECTION IJ SOLN
INTRAMUSCULAR | Status: AC
  Administered 2023-07-27: 1.2 mL
  Filled 2023-07-27: qty 10

## 2023-07-27 MED ORDER — ZIPRASIDONE MESYLATE 20 MG IM SOLR
10.0000 mg | Freq: Once | INTRAMUSCULAR | Status: AC
  Administered 2023-07-27: 10 mg via INTRAMUSCULAR
  Filled 2023-07-27: qty 20

## 2023-07-27 NOTE — ED Notes (Signed)
Pt attempted to elope x4. Security stopped pt outside of purple zone doors. Pt brought back into room and EDP notified of need for meds.

## 2023-07-27 NOTE — ED Provider Notes (Signed)
Emergency Medicine Observation Re-evaluation Note  Lindsey Richmond is a 27 y.o. female, seen on rounds today.  Pt initially presented to the ED for complaints of Mental Health Problem and Psychiatric Evaluation Currently, the patient is patient with history of schizophrenia reported to be off medications and depressed and not taking medications.  She has reportedly been not eating well and has stated that she wanted to kill herself.  She has been slowed..  Physical Exam  BP (!) 107/94 (BP Location: Left Arm)   Pulse 74   Temp (!) 97.5 F (36.4 C) (Oral)   Resp 18   SpO2 100%  Physical Exam General: Obese female sitting on the bed does not appear to be in any acute distress Cardiac: Normal heart rate and blood pressure Lungs: Normal oxygen saturations and respiratory rate Psych: Patient appears somewhat hypervigilant.  She questions whether or not she has to be here.  ED Course / MDM  EKG:EKG Interpretation Date/Time:  Thursday July 26 2023 15:22:38 EDT Ventricular Rate:  71 PR Interval:  193 QRS Duration:  96 QT Interval:  399 QTC Calculation: 434 R Axis:   9  Text Interpretation: Sinus rhythm Borderline T abnormalities, anterior leads when compared to prior, similar appearance. NO STEMI Confirmed by Theda Belfast ([id number]) on 07/26/2023 10:31:56 PM  I have reviewed the labs performed to date as well as medications administered while in observation.  Recent changes in the last 24 hours include patient is reported to be stable for disposition to inpatient facility.  Plan  Current plan is for patient is to go to [facility] per patient's RN.    Margarita Grizzle, MD 07/27/23 [phone]

## 2023-07-27 NOTE — ED Notes (Signed)
Left voicemail on [facility] secure pager line w/ updated VS and ETA of Sheriff arrival to ED.

## 2023-07-27 NOTE — ED Notes (Signed)
Called [place] and left a message requesting a call back so that update can be given

## 2023-07-27 NOTE — ED Notes (Signed)
Pt coming out of her room multiple times and being redirected back to her room multiple times.

## 2023-07-27 NOTE — ED Notes (Signed)
Left message on [facility] secure pager line that IVC papers are being fixed and that the plan is still to send the pt and that this RN will update them once papers are fixed.

## 2023-07-27 NOTE — ED Notes (Addendum)
Pt attempted to walk out of purple zone d/t pt in next room being disruptive. Pt was redirected back to her room. While security assisted pt back to room, she was fighting and screaming. Pt started to fall and was assisted to the ground. Pt was then walked back to her room. Pt in NAD. EDP notified.

## 2023-07-27 NOTE — ED Notes (Signed)
Notified [employer] operational officer to Doctor, general practice for transport

## 2023-07-27 NOTE — ED Notes (Signed)
Pt came out of room x 2, was redirectable the first time and then security had to assist pt back to room.

## 2023-07-27 NOTE — ED Notes (Signed)
Pt walked briskly towards purple zone doors, 2 security and 1 off duty [employer] had to ambulate pt back to her room.

## 2023-07-27 NOTE — ED Notes (Signed)
IVC is current

## 2023-07-27 NOTE — Progress Notes (Signed)
CSW received call from Cleon Gustin from [facility] (tailored care management) who was attempting to offer assistance in finding inpatient psychiatric treatment for patient. CSW informed Mick Sell that placement had been found.  Edwin Dada, MSW, LCSW Transitions of Care  Clinical Social Worker II [phone]

## 2023-07-27 NOTE — ED Notes (Signed)
Carrie from [facility] called this RN back and RN gave her updated VS and updated her about pt receiving IM geodon. Per [facility] pt must be observed for 2 hrs and have stable VS before pt to be transported to [facility]. BH team updated. Sheriff to get pt at 1648 and to call this RN if something comes up.

## 2023-07-27 NOTE — ED Notes (Signed)
ETA to [facility] 3- 5 hours

## 2023-07-27 NOTE — ED Notes (Signed)
Pt ambulatory out of purple zone w/ steady gait. VSS and pt calm and cooperative at time of departure. All paperwork and belongings given to [facility].

## 2023-07-27 NOTE — ED Provider Notes (Signed)
Emergency Medicine Observation Re-evaluation Note  Lindsey Richmond is a 27 y.o. female, seen on rounds today.  Pt initially presented to the ED for complaints of Mental Health Problem and Psychiatric Evaluation Currently, the patient is being transferred to [facility].  Physical Exam  BP 113/74 (BP Location: Right Arm)   Pulse 76   Temp 98.4 F (36.9 C) (Oral)   Resp 18   SpO2 100%  Physical Exam Vitals reviewed.  Constitutional:      Appearance: Normal appearance.  Cardiovascular:     Rate and Rhythm: Normal rate.  Pulmonary:     Effort: Pulmonary effort is normal.  Abdominal:     General: There is no distension.  Neurological:     General: No focal deficit present.     Mental Status: She is alert.  Psychiatric:        Mood and Affect: Mood normal.      ED Course / MDM  EKG:EKG Interpretation Date/Time:  Thursday July 26 2023 15:22:38 EDT Ventricular Rate:  71 PR Interval:  193 QRS Duration:  96 QT Interval:  399 QTC Calculation: 434 R Axis:   9  Text Interpretation: Sinus rhythm Borderline T abnormalities, anterior leads when compared to prior, similar appearance. NO STEMI Confirmed by Theda Belfast ([id number]) on 07/26/2023 10:31:56 PM  I have reviewed the labs performed to date as well as medications administered while in observation.  Recent changes in the last 24 hours include accepted at [facility].  Plan  Current plan is for transfer to [facility].Anders Simmonds T, DO 07/27/23 [phone]

## 2023-07-27 NOTE — ED Notes (Signed)
Pt came out of room again. Pt was verbally redirected back to her room. Pt obviously responding to internal stimuli, smiling inappropriately, etc.

## 2023-07-27 NOTE — ED Notes (Signed)
Report given to [facility] with [facility]; Day Shift RN to call when patient is leaving and to give updated vitals at [phone]-[phone],RN

## 2023-07-27 NOTE — ED Notes (Signed)
Pt's door closed to prevent further agitation from a pt in purple zone

## 2023-11-03 ENCOUNTER — Other Ambulatory Visit: Payer: Self-pay

## 2023-11-03 ENCOUNTER — Emergency Department (HOSPITAL_COMMUNITY)
Admission: EM | Admit: 2023-11-03 | Discharge: 2023-11-05 | Disposition: A | Discharge status: Other Acute Inpatient Hospital | Payer: Medicare Other | Attending: Emergency Medicine | Admitting: Emergency Medicine

## 2023-11-03 ENCOUNTER — Encounter (HOSPITAL_COMMUNITY): Payer: Self-pay | Admitting: *Deleted

## 2023-11-03 DIAGNOSIS — R456 Violent behavior: Secondary | ICD-10-CM | POA: Diagnosis present

## 2023-11-03 DIAGNOSIS — F29 Unspecified psychosis not due to a substance or known physiological condition: Secondary | ICD-10-CM | POA: Diagnosis not present

## 2023-11-03 DIAGNOSIS — F203 Undifferentiated schizophrenia: Secondary | ICD-10-CM | POA: Insufficient documentation

## 2023-11-03 DIAGNOSIS — F22 Delusional disorders: Secondary | ICD-10-CM | POA: Insufficient documentation

## 2023-11-03 DIAGNOSIS — Z91148 Patient's other noncompliance with medication regimen for other reason: Secondary | ICD-10-CM | POA: Diagnosis not present

## 2023-11-03 NOTE — BH Assessment (Signed)
@   2258, the clinician attempted to call the telepsych machine(s) but received no answer. The patient's nurse, Nicolasa Ducking, RN, was notified of the attempts to complete the patient's telepsych session. I also requested patient's nurse to notify TTS when the patient is available and ready to be seen, as the machine is prepared.

## 2023-11-03 NOTE — BH Assessment (Addendum)
@   2246, Clinician requested that the patient's nurse, Nicolasa Ducking, RN, to place the TTS machine in the patient's room to initiate the patient's initial TTS assessment.

## 2023-11-03 NOTE — ED Triage Notes (Signed)
Pt arrived with PD under IVC by family. Pt reports nausea on arrival. Per IVC paperwork, pt having hallucinations, asking from help with [employer] then refusing help. Pt cooperative at present

## 2023-11-03 NOTE — ED Provider Notes (Signed)
EMERGENCY DEPARTMENT AT [facility] Provider Note   CSN: [id number] Arrival date & time: 11/03/23  2253     History {Add pertinent medical, surgical, social history, OB history to HPI:1} Chief Complaint  Patient presents with  . Medical Clearance    Lindsey Richmond is a 28 y.o. female.  Patient under IVC by family member. History of psychosis, noncompliant with medications. Patient has been yelling at strangers, having conversations that make no sense, calling police repeatedly then refusing to cooperate, has been aggressive with family.   The history is provided by the patient. No language interpreter was used.       Home Medications Prior to Admission medications   Medication Sig Start Date End Date Taking? Authorizing Provider  benztropine (COGENTIN) 1 MG tablet Take 1 tablet (1 mg total) by mouth 2 (two) times daily. For prevention of tremors Patient not taking: Reported on 07/26/2023 10/06/20   Armandina Stammer I, NP  doxycycline (VIBRAMYCIN) 100 MG capsule Take 100 mg by mouth 2 (two) times daily. Patient not taking: Reported on 07/26/2023 12/15/21   [provider]  fluPHENAZine (PROLIXIN) 10 MG tablet Take 1 tablet (10 mg total) by mouth 2 (two) times daily. For mood control Patient not taking: Reported on 07/26/2023 10/06/20   Armandina Stammer I, NP  fluPHENAZine (PROLIXIN) 5 MG tablet Take 3 tablets (15 mg total) by mouth at bedtime. Patient not taking: Reported on 07/26/2023 10/06/20   Armandina Stammer I, NP  LORazepam (ATIVAN) 2 MG tablet Take 1 tablet (2 mg total) by mouth 2 (two) times daily. For severe anxiety Patient not taking: Reported on 07/26/2023 10/06/20   Armandina Stammer I, NP  paliperidone (INVEGA SUSTENNA) 156 MG/ML SUSY injection INJECT AS DIRECTED INPATIENT ON 09/29/20 09/28/20 09/28/21  Antonieta Pert, MD  traZODone (DESYREL) 100 MG tablet Take 1 tablet (100 mg total) by mouth at bedtime. For sleep Patient not taking: Reported on 07/26/2023  10/06/20   Armandina Stammer I, NP      Allergies    Patient has no known allergies.    Review of Systems   Review of Systems  Physical Exam Updated Vital Signs BP (!) 160/108 (BP Location: Left Arm)   Pulse (!) 119   Temp 98.3 F (36.8 C) (Oral)   Resp 18   SpO2 100%  Physical Exam Constitutional:      General: She is not in acute distress.    Appearance: She is well-developed. She is not ill-appearing.  Pulmonary:     Effort: Pulmonary effort is normal.  Musculoskeletal:        General: Normal range of motion.     Cervical back: Normal range of motion.  Skin:    General: Skin is warm and dry.  Neurological:     Mental Status: She is alert and oriented to person, place, and time.  Psychiatric:        Mood and Affect: Affect is labile and blunt.        Speech: Speech is delayed.        Behavior: Behavior is withdrawn.        Thought Content: Thought content is delusional.     ED Results / Procedures / Treatments   Labs (all labs ordered are listed, but only abnormal results are displayed) Labs Reviewed - No data to display  EKG None  Radiology No results found.  Procedures Procedures  {Document cardiac monitor, telemetry assessment procedure when appropriate:1}  Medications Ordered in ED  Medications - No data to display  ED Course/ Medical Decision Making/ A&P Clinical Course as of 11/03/23 2314  Sat Nov 03, 2023  2313 Under IVC from family. Exhibiting symptoms of acute psychosis. Answers to questions in the ED nonsensical. Psych hold orders placed, TTS requested. Cooperative for now.  [SU]    Clinical Course User Index [SU] Elpidio Anis, PA-C   {   Click here for ABCD2, HEART and other calculatorsREFRESH Note before signing :1}                              Medical Decision Making  ***  {Document critical care time when appropriate:1} {Document review of labs and clinical decision tools ie heart score, Chads2Vasc2 etc:1}  {Document your independent  review of radiology images, and any outside records:1} {Document your discussion with family members, caretakers, and with consultants:1} {Document social determinants of health affecting pt's care:1} {Document your decision making why or why not admission, treatments were needed:1} Final Clinical Impression(s) / ED Diagnoses Final diagnoses:  None    Rx / DC Orders ED Discharge Orders     None

## 2023-11-04 DIAGNOSIS — F29 Unspecified psychosis not due to a substance or known physiological condition: Secondary | ICD-10-CM | POA: Diagnosis not present

## 2023-11-04 LAB — CBC WITH DIFFERENTIAL/PLATELET
Abs Immature Granulocytes: 0.01 10*3/uL (ref 0.00–0.07)
Basophils Absolute: 0 10*3/uL (ref 0.0–0.1)
Basophils Relative: 0 %
Eosinophils Absolute: 0.2 10*3/uL (ref 0.0–0.5)
Eosinophils Relative: 3 %
HCT: 40.3 % (ref 36.0–46.0)
Hemoglobin: 13.4 g/dL (ref 12.0–15.0)
Immature Granulocytes: 0 %
Lymphocytes Relative: 36 %
Lymphs Abs: 2.7 10*3/uL (ref 0.7–4.0)
MCH: 31.2 pg (ref 26.0–34.0)
MCHC: 33.3 g/dL (ref 30.0–36.0)
MCV: 93.7 fL (ref 80.0–100.0)
Monocytes Absolute: 0.7 10*3/uL (ref 0.1–1.0)
Monocytes Relative: 9 %
Neutro Abs: 3.9 10*3/uL (ref 1.7–7.7)
Neutrophils Relative %: 52 %
Platelets: 264 10*3/uL (ref 150–400)
RBC: 4.3 MIL/uL (ref 3.87–5.11)
RDW: 12.8 % (ref 11.5–15.5)
WBC: 7.5 10*3/uL (ref 4.0–10.5)
nRBC: 0 % (ref 0.0–0.2)

## 2023-11-04 LAB — COMPREHENSIVE METABOLIC PANEL
ALT: 23 U/L (ref 0–44)
AST: 26 U/L (ref 15–41)
Albumin: 4.1 g/dL (ref 3.5–5.0)
Alkaline Phosphatase: 52 U/L (ref 38–126)
Anion gap: 10 (ref 5–15)
BUN: 10 mg/dL (ref 6–20)
CO2: 28 mmol/L (ref 22–32)
Calcium: 9.6 mg/dL (ref 8.9–10.3)
Chloride: 103 mmol/L (ref 98–111)
Creatinine, Ser: 0.71 mg/dL (ref 0.44–1.00)
GFR, Estimated: >60 mL/min (ref >60)
Glucose, Bld: 93 mg/dL (ref 70–99)
Potassium: 3.8 mmol/L (ref 3.5–5.1)
Sodium: 141 mmol/L (ref 135–145)
Total Bilirubin: 1 mg/dL (ref 0.0–1.2)
Total Protein: 8.3 g/dL — ABNORMAL HIGH (ref 6.5–8.1)

## 2023-11-04 LAB — HCG, SERUM, QUALITATIVE: Preg, Serum: NEGATIVE

## 2023-11-04 LAB — SALICYLATE LEVEL: Salicylate Lvl: <7 mg/dL — ABNORMAL LOW (ref 7.0–30.0)

## 2023-11-04 LAB — ACETAMINOPHEN LEVEL: Acetaminophen (Tylenol), Serum: <10 ug/mL — ABNORMAL LOW (ref 10–30)

## 2023-11-04 LAB — ETHANOL: Alcohol, Ethyl (B): <10 mg/dL (ref <10)

## 2023-11-04 MED ORDER — ZIPRASIDONE MESYLATE 20 MG IM SOLR: 20.0000 mg | INTRAMUSCULAR | Status: DC | PRN

## 2023-11-04 MED ORDER — LORAZEPAM 1 MG PO TABS: 1.0000 mg | ORAL_TABLET | ORAL | Status: DC | PRN

## 2023-11-04 MED ORDER — RISPERIDONE 0.5 MG PO TBDP
2.0000 mg | ORAL_TABLET | Freq: Three times a day (TID) | ORAL | Status: DC | PRN
  Administered 2023-11-05: 1 mg via ORAL
  Filled 2023-11-04: qty 4

## 2023-11-04 NOTE — ED Notes (Signed)
Pt given graham crackers; asking for meal, given time when breakfast would be available. Pt redirectable to her room, continues to refuse to change into scrubs

## 2023-11-04 NOTE — ED Notes (Signed)
Pt moved into a room for TTS assessment; [employer] officer at bedside

## 2023-11-04 NOTE — ED Notes (Signed)
PT IS SLEEPING WILL UPDATE VITALS WHEN SHE WAKES UP.

## 2023-11-04 NOTE — BH Assessment (Addendum)
Comprehensive Clinical Assessment (CCA) Note  11/04/2023 Lindsey Richmond [id number]  Disposition: Per Cecilio Asper, FNP, NP, patient meets criteria for inpatient psychiatric treatment. Disposition Social Worker to seek appropriate placement.   Chief Complaint:  Chief Complaint  Patient presents with   Medical Clearance   Visit Diagnosis: 295.90 - Schizophrenia, Undifferentiated Type  Lindsey Richmond is a 28 year old female with a history of schizophrenia.  The patient arrived via [facility] IVC paperwork. Upon arrival, she reported feeling nauseous. However, the IVC documents indicate she has been experiencing hallucinations and has been requesting assistance with her general physical condition but then refusing it. At present, the patient is cooperative.  The patient denies suicidal and homicidal ideations and has no legal issues. Also, denies a history of self injurious behaviors, depressive symptoms, and anxiety.  She denies a history of auditory or visual hallucinations at present. However, when asked additional questions regarding hallucinations, she inappropriately smiles and seems to be responding to internal stimuli. She denies experiencing paranoia.The patient denies any history of alcohol or drug use.  She reports a history of multiple inpatient psychiatric admissions. Her most recent hospitalization occurred "a few months ago," and when asked for the location, she mentioned [facility]. When asked about the reason for her admission, she mumbled to herself and appeared unable to answer, as she was observed smiling.   When asked about an outpatient therapist or psychiatrist, the patient stated she does not have one and further claimed that she has never been prescribed psychiatric medications nor needs them. However, according to her chart, she has been prescribed the following medications: Trazodone 100 mg at bedtime, Paliperidone 156 mg/mL injectable suspension, Lorazepam 2 mg tablets twice daily,  Prolixin 5 mg (3 tablets totaling 15 mg) at bedtime, Prolixin 10 mg daily, Cogentin 1 mg twice daily, and Doxycycline 100 mg twice daily.   According to her medical history, she was hospitalized at [facility] from 09/28/2020 to 10/06/2020 for medication noncompliance and catatonia. During that hospitalization, she was prescribed Benztropine 1 mg twice daily for EPS/tremors, Fluphenazine 10 mg twice daily and 15 mg at bedtime, Ativan 2 mg twice daily for severe anxiety, and Trazodone 100 mg at bedtime. She was advised to follow up with the [facility] outpatient clinic for medication management.  The patient is single, has no children, is unemployed, and is not currently attending school. She lives with her mother and brother. Her highest level of education is some college. She denies any history of trauma or abuse. When asked about her support system, she was unable to identify anyone but mentioned hoping to have a better relationship with her brother. Her hobbies include reading and writing, and she also plays the violin.  On Mental Status Examination (MSE), the patient is alert and oriented to person, place, and time. Her speech is coherent, though at times somewhat slow, and she appears to be responding to internal stimuli as evidenced by inappropriate smiling when asked about hallucinations. Her mood is somewhat flat, and her affect is congruent with her mood. Thought process is mildly disorganized, and she exhibits some tangentiality when discussing her ultrasound request. There is no evidence of suicidal or homicidal ideation, and her judgment seems intact regarding basic care needs. Her insight into her psychiatric condition appears limited. The patient denies current or recent suicidal thoughts and, when asked about her history of suicide, responded, "I don't want to talk about it." She denies any history of self-injurious behavior and has no access to weapons, particularly  firearms. She denies  symptoms of depression and anxiety. She reports no sleep issues, claiming she sleeps about 7 hours a night. Her appetite is fair, and she typically only eats at night. She states her last meal was yesterday.  CCA Screening, Triage and Referral (STR)  Patient Reported Information How did you hear about [place]? Family/Friend  What Is the Reason for Your Visit/Call Today? The reason for the visit is for a psychiatric evaluation and possible admission due to ongoing symptoms related to the patient's schizophrenia, including hallucinations, nausea, and social difficulties. The patient expressed a need for temporary admission, citing social problems and a request for an ultrasound, though she was unable to provide a clear explanation for the ultrasound.  How Long Has This Been Causing You Problems? 1 wk - 1 month  What Do You Feel Would Help You the Most Today? Alcohol or Drug Use Treatment; Medication(s); Social Support   Have You Recently Had Any Thoughts About Hurting Yourself? No  Are You Planning to Commit Suicide/Harm Yourself At This time? No   Flowsheet Row ED from 07/26/2023 in [facility] at [facility] ED from 12/29/2021 in [facility] ED from 12/19/2021 in [facility] at [facility]  C-SSRS RISK CATEGORY High Risk No Risk No Risk       Have you Recently Had Thoughts About Hurting Someone Lindsey Richmond? No  Are You Planning to Harm Someone at This Time? No  Explanation: The patient denies having any homicidal ideations or plans to harm others.   Have You Used Any Alcohol or Drugs in the Past 24 Hours? No  How Long Ago Did You Use Drugs or Alcohol? Patient denies.  What Did You Use and How Much? None   Do You Currently Have a Therapist/Psychiatrist? Yes  Name of Therapist/Psychiatrist: Name of Therapist/Psychiatrist: The patient reports that she does not have an outpatient therapist or psychiatrist.  She further states that she has never been prescribed psychiatric medications nor feels the need for them, although her chart indicates prior psychiatric medication prescriptions and hospitalizations. Per chart review, patient has stated that she see's Dr. Jannifer Franklin for medication management.   Have You Been Recently Discharged From Any Office Practice or Programs? No  Explanation of Discharge From Practice/Program: No known recent discharges     CCA Screening Triage Referral Assessment Type of Contact: Tele-Assessment  Telemedicine Service Delivery: Telemedicine service delivery: This service was provided via telemedicine using a 2-way, interactive audio and video technology  Is this Initial or Reassessment? Is this Initial or Reassessment?: Initial Assessment  Date Telepsych consult ordered in [facility]:  Date Telepsych consult ordered in [facility]: 07/25/24  Time Telepsych consult ordered in [facility]:  Time Telepsych consult ordered in [facility]: 0046  Location of Assessment: Other (comment)  Provider Location: [facility] Assessment Services   Collateral Involvement: Attempted to call mother Lindsey Richmond and brother Lindsey Richmond and got no answer.   Does Patient Have a Automotive engineer Guardian? No  Legal Guardian Contact Information: Patient denies that she has a legal guardian.  Copy of Legal Guardianship Form: No - copy requested  Legal Guardian Notified of Arrival: -- (Patient denies that she has a legal guardian.)  Legal Guardian Notified of Pending Discharge: -- (PAtient denies that she has a guardian.)  If Minor and Not Living with Parent(s), Who has Custody? n/a  Is CPS involved or ever been involved? Never  Is APS involved or ever been involved? Never  Patient Determined To Be At Risk for Harm To Self or Others Based on Review of Patient Reported Information or Presenting Complaint? Yes, for Self-Harm  Method: No Plan  Availability of Means: No access or NA  Intent: Vague intent or  NA  Notification Required: No need or identified person  Additional Information for Danger to Others Potential: Active psychosis  Additional Comments for Danger to Others Potential: Pt denies HI.  Are There Guns or Other Weapons in Your Home? No  Types of Guns/Weapons: No weapons reported  Are These Weapons Safely Secured?                            No  Who Could Verify You Are Able To Have These Secured: Unknown  Do You Have any Outstanding Charges, Pending Court Dates, Parole/Probation? Patient denies.  Contacted To Inform of Risk of Harm To Self or Others: Other: Comment    Does Patient Present under Involuntary Commitment? Yes    [place] of Residence: [place]   Patient Currently Receiving the Following Services: Medication Management   Determination of Need: Urgent (48 hours)   Options For Referral: Medication Management; Inpatient Hospitalization     CCA Biopsychosocial Patient Reported Schizophrenia/Schizoaffective Diagnosis in Past: Yes   Strengths: Pt says she is good at writing, drawing and math.   Mental Health Symptoms Depression:  Increase/decrease in appetite; Hopelessness; Change in energy/activity   Duration of Depressive symptoms: Duration of Depressive Symptoms: Greater than two weeks   Mania:  None   Anxiety:   N/A   Psychosis:  Grossly disorganized or catatonic behavior; Grossly disorganized speech; Other negative symptoms   Duration of Psychotic symptoms: Duration of Psychotic Symptoms: Greater than six months   Trauma:  Detachment from others   Obsessions:  N/A   Compulsions:  N/A   Inattention:  N/A   Hyperactivity/Impulsivity:  N/A   Oppositional/Defiant Behaviors:  Defies rules   Emotional Irregularity:  Mood lability   Other Mood/Personality Symptoms:  Schizophrenia    Mental Status Exam Appearance and self-care  Stature:  Average   Weight:  Obese   Clothing:  Disheveled   Grooming:  Neglected   Cosmetic  use:  None   Posture/gait:  Normal   Motor activity:  Not Remarkable   Sensorium  Attention:  Normal   Concentration:  Anxiety interferes   Orientation:  Person; Place; Object   Recall/memory:  Defective in Immediate; Defective in Short-term   Affect and Mood  Affect:  Inappropriate   Mood:  Depressed; Other (Comment)   Relating  Eye contact:  Normal   Facial expression:  Sad   Attitude toward examiner:  Silly; Passive   Thought and Language  Speech flow: Flight of Ideas; Slow; Soft   Thought content:  Appropriate to Mood and Circumstances   Preoccupation:  Other (Comment)   Hallucinations:  Other (Comment)   Organization:  Insurance underwriter of Knowledge:  Poor   Intelligence:  Average   Abstraction:  Concrete   Judgement:  Poor   Reality Testing:  Distorted; Variable   Insight:  Poor   Decision Making:  Impulsive   Social Functioning  Social Maturity:  Irresponsible; Impulsive   Social Judgement:  Naive; Heedless   Stress  Stressors:  Family conflict; Grief/losses   Coping Ability:  Exhausted; Overwhelmed   Skill Deficits:  Communication; Responsibility; Interpersonal   Supports:  Family     Religion: Religion/Spirituality  Are You A Religious Person?: No How Might This Affect Treatment?: Pt denies being a religious person  Leisure/Recreation: Leisure / Recreation Do You Have Hobbies?: Yes Leisure and Hobbies: Writing and drawing  Exercise/Diet: Exercise/Diet Do You Exercise?: Yes What Type of Exercise Do You Do?: Run/Walk How Many Times a Week Do You Exercise?: 6-7 times a week Have You Gained or Lost A Significant Amount of Weight in the Past Six Months?: No Do You Follow a Special Diet?: No Do You Have Any Trouble Sleeping?: No   CCA Employment/Education Employment/Work Situation: Employment / Work Systems developer: On disability Why is Patient on Disability: Mental Health How Long has  Patient Been on Disability: Pt says "Since I was 14." Patient's Job has Been Impacted by Current Illness: No Has Patient ever Been in the [employer]?: No  Education: Education Is Patient Currently Attending School?: No Last Grade Completed: 12 Did You Attend College?: Yes What Type of College Degree Do you Have?: "some college" Did You Have An Individualized Education Program (IIEP): No Did You Have Any Difficulty At School?: No Patient's Education Has Been Impacted by Current Illness: No   CCA Family/Childhood History Family and Relationship History: Family history Marital status: Single Does patient have children?: No  Childhood History:  Childhood History By whom was/is the patient raised?: Mother Did patient suffer any verbal/emotional/physical/sexual abuse as a child?: No Did patient suffer from severe childhood neglect?: No Has patient ever been sexually abused/assaulted/raped as an adolescent or adult?: No Was the patient ever a victim of a crime or a disaster?: No Witnessed domestic violence?: No Has patient been affected by domestic violence as an adult?: No       CCA Substance Use Alcohol/Drug Use: Alcohol / Drug Use Pain Medications: see MAR Prescriptions: see MAR Over the Counter: see MAR History of alcohol / drug use?: No history of alcohol / drug abuse Longest period of sobriety (when/how long): none Negative Consequences of Use: Financial Withdrawal Symptoms: None                         ASAM's:  Six Dimensions of Multidimensional Assessment  Dimension 1:  Acute Intoxication and/or Withdrawal Potential:   Dimension 1:  Description of individual's past and current experiences of substance use and withdrawal: NONE  Dimension 2:  Biomedical Conditions and Complications:      Dimension 3:  Emotional, Behavioral, or Cognitive Conditions and Complications:     Dimension 4:  Readiness to Change:     Dimension 5:  Relapse, Continued use, or  Continued Problem Potential:     Dimension 6:  Recovery/Living Environment:     ASAM Severity Score: ASAM's Severity Rating Score: 0  ASAM Recommended Level of Treatment: ASAM Recommended Level of Treatment: Level I Outpatient Treatment   Substance use Disorder (SUD)    Recommendations for Services/Supports/Treatments: Recommendations for Services/Supports/Treatments Recommendations For Services/Supports/Treatments: Medication Management, Individual Therapy  Disposition Recommendation per psychiatric provider: We recommend inpatient psychiatric hospitalization when medically cleared. Patient is under voluntary admission status at this time; please IVC if attempts to leave hospital.   DSM5 Diagnoses: Patient Active Problem List   Diagnosis Date Noted   Catatonic schizophrenia (HCC) 10/19/2020   Catatonic state    Schizoaffective disorder, bipolar type (HCC)    Schizo-affective schizophrenia (HCC) 12/25/2019   Schizophrenia (HCC) 12/25/2019   Schizophrenia (HCC) 01/15/2019   Schizophrenia, paranoid (HCC) 04/20/2017     Referrals to Alternative Service(s): Referred to Alternative  Service(s):   Place:   Date:   Time:    Referred to Alternative Service(s):   Place:   Date:   Time:    Referred to Alternative Service(s):   Place:   Date:   Time:    Referred to Alternative Service(s):   Place:   Date:   Time:     Melynda Ripple, Counselor

## 2023-11-04 NOTE — Progress Notes (Signed)
LCSW Progress Note  [id number]   Lindsey Richmond  11/04/2023  4:06 PM    Inpatient Behavioral Health Placement  Pt meets inpatient criteria per  Cecilio Asper, FNP, NP. There are no available beds within [facility] system per[facility] AC Linsey Strader,RN. Referral was sent to the following facilities;   Destination  Service Provider Address Phone [facility] [address] [place], [place] [address] [phone]  [facility] [address] [place] [address] [phone]  [facility] [address], [place] [address] [phone]  [facility] [address], [place] [address] [phone]  [facility] [address] [place], [place] [address] [phone]  [facility] [address] [place] [address] [phone]  [facility] [address] [place], [place] [address] [phone]  [facility] [address] [place] [address] [phone]  [facility] [address] [place] [address] [phone]  [facility] [address], [place] [address] [phone]  [facility] [address] [place] [address] [phone]  [facility] [address], [place] [address] [phone]  [facility] [address], [place] [address] [phone]  [facility] [place] [phone]  [facility] EFAX [address] [place], [place] [phone]  [facility] [address], [place] [address] [phone]   [facility] [address] [place] [address] [phone]  [name] [address], [place] [address] [phone]  [facility] [address], [place] [address] [phone]  [facility] [address] [place] [address] [phone]  [facility] [address] [place] [address] [phone]  [facility] Inpatient [place] [phone]  [facility] Patient Placement [facility], [place] [phone]  [facility] [address] [place] [address] [phone]  [facility] [address] [place] [address] [phone]     Situation ongoing,  CSW will follow up.    Maryjean Ka, MSW, [facility] 11/04/2023 4:06 PM

## 2023-11-04 NOTE — ED Notes (Addendum)
Pt belongings located in locker 36, 2 white belonging bags and a pocket book.

## 2023-11-04 NOTE — ED Notes (Signed)
Pt refused to change into burgundy scrubs

## 2023-11-04 NOTE — Progress Notes (Signed)
Pt was accepted to [facility] TOMORROW 11/05/2023; Bed Assignment Deatra Canter A   Pt meets inpatient criteria per Cecilio Asper, FNP, NP   Attending Physician will be Rolanda Jay, MD  Report can be called to: - [phone]  Pt can arrive after:7:00am  Care Team notified:Dara Kelton Pillar, LCSWA 11/04/2023 @ 4:18 PM

## 2023-11-05 DIAGNOSIS — F29 Unspecified psychosis not due to a substance or known physiological condition: Secondary | ICD-10-CM | POA: Diagnosis not present

## 2023-11-05 NOTE — ED Notes (Addendum)
Pt refusing vitals and medication at this time.

## 2023-11-05 NOTE — ED Notes (Signed)
Pt refused vitals at this time and would only take half of her PRN meds

## 2023-11-05 NOTE — ED Notes (Signed)
Patient off unit to [facility] per provider. Patient alert, calm , cooperative, no s/s of distress at this time. Discharge information and belongings given to sheriff for transport. Patient ambulatory off unit, escorted and transported by [facility]

## 2023-11-05 NOTE — ED Notes (Signed)
Patient asked for a sausage biscuit then I fixed it for her and brought it to her and she said I'm all right I don't want it now. So I left it out on the counter for her when she changes her mind.

## 2023-11-05 NOTE — ED Provider Notes (Signed)
Emergency Medicine Observation Re-evaluation Note  Lindsey Richmond is a 28 y.o. female, seen on rounds today.  Pt initially presented to the ED for complaints of Medical Clearance Currently, the patient is awaiting psychiatric placement.  Physical Exam  BP (!) 102/57 (BP Location: Right Arm)   Pulse 96   Temp 98.9 F (37.2 C) (Oral)   Resp 18   SpO2 99%  Physical Exam Alert and in no acute distress  ED Course / MDM  EKG:   I have reviewed the labs performed to date as well as medications administered while in observation.  Recent changes in the last 24 hours include none.  Plan  Current plan is for psychiatric placement.    Bethann Berkshire, MD 11/05/23 [phone]

## 2023-11-05 NOTE — ED Notes (Addendum)
Patient has been standing up and the window all morning staring through glass. Patient has been also asking to leave. She also has been asking for a key card to get through the doors. Patient also has been asking to get her belongings. I informed patient that we are awaiting her ride to take her to her new facility.

## 2023-11-05 NOTE — ED Notes (Signed)
Pt is still awake and had to be asked to go to her room several times.  Pt has not slept at all.

## 2023-11-05 NOTE — ED Notes (Signed)
Per previous shift RN, report was given for Lindsey Richmond.  Called to try to inform Lindsey Richmond patient was on the way. No answer at [facility].

## 2023-11-05 NOTE — ED Notes (Signed)
Pt is a little agitated. Tried to give pt PRN meds and she refused them. Pt wants to call 911. Pt keeps walking back and forth to the bathroom from her room.

## 2023-11-20 ENCOUNTER — Emergency Department (HOSPITAL_COMMUNITY)
Admission: EM | Admit: 2023-11-20 | Discharge: 2023-11-20 | Disposition: A | Discharge status: Home / Self Care | Payer: Medicare Other | Attending: Emergency Medicine | Admitting: Emergency Medicine

## 2023-11-20 DIAGNOSIS — Z046 Encounter for general psychiatric examination, requested by authority: Secondary | ICD-10-CM | POA: Insufficient documentation

## 2023-11-20 DIAGNOSIS — Z5321 Procedure and treatment not carried out due to patient leaving prior to being seen by health care provider: Secondary | ICD-10-CM | POA: Insufficient documentation

## 2023-12-14 ENCOUNTER — Other Ambulatory Visit: Payer: Self-pay

## 2023-12-14 ENCOUNTER — Emergency Department (HOSPITAL_COMMUNITY)
Admission: EM | Admit: 2023-12-14 | Discharge: 2023-12-14 | Disposition: A | Discharge status: Home / Self Care | Attending: Emergency Medicine | Admitting: Emergency Medicine

## 2023-12-14 DIAGNOSIS — Z3202 Encounter for pregnancy test, result negative: Secondary | ICD-10-CM | POA: Diagnosis present

## 2023-12-14 LAB — HCG, QUANTITATIVE, PREGNANCY: hCG, Beta Chain, Quant, S: <1 m[IU]/mL (ref <5)

## 2023-12-14 NOTE — ED Triage Notes (Signed)
 Patient arrived with EMS from home requesting pregnancy test , unable to recall LMP , denies spotting /vaginal discharge or cramping .

## 2023-12-14 NOTE — ED Notes (Signed)
 Patient verbalizes understanding of discharge instructions. Opportunity for questioning and answers were provided. Armband removed by staff, pt discharged from ED. Provided with bus pass, left to lobby

## 2023-12-14 NOTE — Discharge Instructions (Signed)
 Pregnancy test is negative today. Follow-up with your doctor. Return here for new concerns.

## 2023-12-14 NOTE — ED Provider Notes (Signed)
 [place] EMERGENCY DEPARTMENT AT [facility] Provider Note   CSN: [id number] Arrival date & time: 12/14/23  0444     History  Chief Complaint  Patient presents with   Requesting Preg Test    Lindsey Richmond is a 28 y.o. female.  The history is provided by the patient and medical records.   28 year old female with history of schizophrenia, presenting to the ED requesting pregnancy test.  Unsure of LMP.  She denies any vaginal bleeding, abdominal pain, or other concerns at this time.  Home Medications Prior to Admission medications   Medication Sig Start Date End Date Taking? Authorizing Provider  benztropine (COGENTIN) 1 MG tablet Take 1 tablet (1 mg total) by mouth 2 (two) times daily. For prevention of tremors Patient not taking: Reported on 07/26/2023 10/06/20   Armandina Stammer I, NP  doxycycline (VIBRAMYCIN) 100 MG capsule Take 100 mg by mouth 2 (two) times daily. Patient not taking: Reported on 07/26/2023 12/15/21   [provider]  fluPHENAZine (PROLIXIN) 10 MG tablet Take 1 tablet (10 mg total) by mouth 2 (two) times daily. For mood control Patient not taking: Reported on 07/26/2023 10/06/20   Armandina Stammer I, NP  fluPHENAZine (PROLIXIN) 5 MG tablet Take 3 tablets (15 mg total) by mouth at bedtime. Patient not taking: Reported on 07/26/2023 10/06/20   Armandina Stammer I, NP  LORazepam (ATIVAN) 2 MG tablet Take 1 tablet (2 mg total) by mouth 2 (two) times daily. For severe anxiety Patient not taking: Reported on 07/26/2023 10/06/20   Armandina Stammer I, NP  paliperidone (INVEGA SUSTENNA) 156 MG/ML SUSY injection INJECT AS DIRECTED INPATIENT ON 09/29/20 09/28/20 11/04/23  Antonieta Pert, MD  traZODone (DESYREL) 100 MG tablet Take 1 tablet (100 mg total) by mouth at bedtime. For sleep Patient not taking: Reported on 07/26/2023 10/06/20   Armandina Stammer I, NP      Allergies    Patient has no known allergies.    Review of Systems   Review of Systems  Constitutional:         Wants preg test  All other systems reviewed and are negative.   Physical Exam Updated Vital Signs BP (!) 103/56 (BP Location: Right Arm)   Pulse 85   Temp 97.8 F (36.6 C) (Oral)   Resp 16   SpO2 100%   Physical Exam Vitals and nursing note reviewed.  Constitutional:      Appearance: She is well-developed.     Comments: Sleeping, awoken for exam  HENT:     Head: Normocephalic and atraumatic.  Eyes:     Conjunctiva/sclera: Conjunctivae normal.     Pupils: Pupils are equal, round, and reactive to light.  Cardiovascular:     Rate and Rhythm: Normal rate and regular rhythm.     Heart sounds: Normal heart sounds.  Pulmonary:     Effort: Pulmonary effort is normal.     Breath sounds: Normal breath sounds.  Abdominal:     General: Bowel sounds are normal.     Palpations: Abdomen is soft.  Musculoskeletal:        General: Normal range of motion.     Cervical back: Normal range of motion.  Skin:    General: Skin is warm and dry.  Neurological:     Mental Status: She is oriented to person, place, and time.     ED Results / Procedures / Treatments   Labs (all labs ordered are listed, but only abnormal results are displayed) Labs  Reviewed  HCG, QUANTITATIVE, PREGNANCY    EKG None  Radiology No results found.  Procedures Procedures    Medications Ordered in ED Medications - No data to display  ED Course/ Medical Decision Making/ A&P                                 Medical Decision Making Amount and/or Complexity of Data Reviewed Labs: ordered. ECG/medicine tests: ordered and independent interpretation performed.   Pregnancy test is negative.  Patient currently asymptomatic without other acute concerns.  Stable for discharge.  Can follow-up with PCP.  Return here for new concerns.  Final Clinical Impression(s) / ED Diagnoses Final diagnoses:  Negative pregnancy test    Rx / DC Orders ED Discharge Orders     None         Garlon Hatchet,  PA-C 12/14/23 9562    Zadie Rhine, MD 12/14/23 [phone]

## 2023-12-18 ENCOUNTER — Encounter (HOSPITAL_COMMUNITY): Payer: Self-pay | Admitting: Emergency Medicine

## 2023-12-18 ENCOUNTER — Emergency Department (HOSPITAL_COMMUNITY)
Admission: EM | Admit: 2023-12-18 | Discharge: 2023-12-18 | Disposition: A | Discharge status: Home / Self Care | Attending: Emergency Medicine | Admitting: Emergency Medicine

## 2023-12-18 ENCOUNTER — Other Ambulatory Visit: Payer: Self-pay

## 2023-12-18 DIAGNOSIS — F209 Schizophrenia, unspecified: Secondary | ICD-10-CM | POA: Insufficient documentation

## 2023-12-18 DIAGNOSIS — Z0000 Encounter for general adult medical examination without abnormal findings: Secondary | ICD-10-CM | POA: Diagnosis present

## 2023-12-18 DIAGNOSIS — Z139 Encounter for screening, unspecified: Secondary | ICD-10-CM

## 2023-12-18 DIAGNOSIS — Z79899 Other long term (current) drug therapy: Secondary | ICD-10-CM | POA: Diagnosis not present

## 2023-12-18 DIAGNOSIS — N912 Amenorrhea, unspecified: Secondary | ICD-10-CM | POA: Diagnosis not present

## 2023-12-18 DIAGNOSIS — Z0489 Encounter for examination and observation for other specified reasons: Secondary | ICD-10-CM | POA: Insufficient documentation

## 2023-12-18 NOTE — ED Provider Notes (Signed)
 [place] EMERGENCY DEPARTMENT AT [facility] Provider Note   CSN: [id number] Arrival date & time: 12/18/23  4098     History  Chief Complaint  Patient presents with   Possible Pregnancy    Lindsey Richmond is a 28 y.o. female.  28 year old female with known history of schizophrenia, multiple behavioral hospitalizations, presents to the emergency department reporting that she is pregnant.  She states that "it is obvious".  She has no complaints of abdominal or pelvic pain, vaginal complaints.  She was seen 4 days ago for this reason with a negative pregnancy test.  Reports that she has not had a menstrual period in a number of years.  The history is provided by the patient. No language interpreter was used.  Possible Pregnancy       Home Medications Prior to Admission medications   Medication Sig Start Date End Date Taking? Authorizing Provider  benztropine (COGENTIN) 1 MG tablet Take 1 tablet (1 mg total) by mouth 2 (two) times daily. For prevention of tremors Patient not taking: Reported on 07/26/2023 10/06/20   Armandina Stammer I, NP  doxycycline (VIBRAMYCIN) 100 MG capsule Take 100 mg by mouth 2 (two) times daily. Patient not taking: Reported on 07/26/2023 12/15/21   [provider]  fluPHENAZine (PROLIXIN) 10 MG tablet Take 1 tablet (10 mg total) by mouth 2 (two) times daily. For mood control Patient not taking: Reported on 07/26/2023 10/06/20   Armandina Stammer I, NP  fluPHENAZine (PROLIXIN) 5 MG tablet Take 3 tablets (15 mg total) by mouth at bedtime. Patient not taking: Reported on 07/26/2023 10/06/20   Armandina Stammer I, NP  LORazepam (ATIVAN) 2 MG tablet Take 1 tablet (2 mg total) by mouth 2 (two) times daily. For severe anxiety Patient not taking: Reported on 07/26/2023 10/06/20   Armandina Stammer I, NP  paliperidone (INVEGA SUSTENNA) 156 MG/ML SUSY injection INJECT AS DIRECTED INPATIENT ON 09/29/20 09/28/20 11/04/23  Antonieta Pert, MD  traZODone (DESYREL) 100 MG tablet  Take 1 tablet (100 mg total) by mouth at bedtime. For sleep Patient not taking: Reported on 07/26/2023 10/06/20   Armandina Stammer I, NP      Allergies    Patient has no known allergies.    Review of Systems   Review of Systems Ten systems reviewed and are negative for acute change, except as noted in the HPI.    Physical Exam Updated Vital Signs BP 135/76 (BP Location: Right Arm)   Pulse 66   Temp 97.6 F (36.4 C)   Resp 17   Ht 5\' 8"  (1.727 m)   Wt 108.4 kg   SpO2 100%   BMI 36.34 kg/m   Physical Exam Vitals and nursing note reviewed.  Constitutional:      General: She is not in acute distress.    Appearance: She is well-developed. She is not diaphoretic.  HENT:     Head: Normocephalic and atraumatic.  Eyes:     General: No scleral icterus.    Conjunctiva/sclera: Conjunctivae normal.  Pulmonary:     Effort: Pulmonary effort is normal. No respiratory distress.  Musculoskeletal:        General: Normal range of motion.     Cervical back: Normal range of motion.  Skin:    General: Skin is warm and dry.     Coloration: Skin is not pale.     Findings: No erythema or rash.  Neurological:     Mental Status: She is alert and oriented to person,  place, and time.  Psychiatric:        Mood and Affect: Affect is flat.        Speech: Speech is tangential.     ED Results / Procedures / Treatments   Labs (all labs ordered are listed, but only abnormal results are displayed) Labs Reviewed - No data to display  EKG None  Radiology No results found.  Procedures Procedures    Medications Ordered in ED Medications - No data to display  ED Course/ Medical Decision Making/ A&P                                 Medical Decision Making  This patient presents to the ED for concern of pregnancy, this involves an extensive number of treatment options, and is a complaint that carries with it a high risk of complications and morbidity.  The differential diagnosis includes  delusional disorder vs ectopic pregnancy vs IUP vs SAB   Co morbidities that complicate the patient evaluation  Schizophrenia    Additional history obtained:  Additional history obtained from EMS personnel External records from outside source obtained and reviewed including negative hCG quant on 12/14/23.   Cardiac Monitoring:  The patient was maintained on a cardiac monitor.  I personally viewed and interpreted the cardiac monitored which showed an underlying rhythm of: NSR   Medicines ordered and prescription drug management:  I have reviewed the patients home medicines and have made adjustments as needed   Test Considered:  hCG qualitative    Problem List / ED Course:  Initially requesting pregnancy test, but later declines this.  States that she is pregnant and that "it is obvious" from looking at her abdomen.  I did explain that she had a negative pregnancy test 4 days ago and that, even if she were pregnant, prenatal care happens outside of the hospital and clinic. I have offered to repeat her pregnancy test today which she declines.  Requests referral to OB/GYN which I have provided. Patient does have a degree of obvious delusion which is likely related to her underlying history of schizophrenia; however, there is no concern for acute psychosis.  She does not appear to be a harm to herself or others.  She is able to be redirected.  No suicidal or homicidal ideations.  Feel that any psychiatric component can also be managed on an outpatient basis.  Patient deemed stable for discharge.   Reevaluation:  After the interventions noted above, I reevaluated the patient and found that they have :stayed the same   Dispostion:  After consideration of the diagnostic results and the patients response to treatment, I feel that the patent would benefit from outpatient f/u PRN. Return precautions discussed and provided. Patient discharged in stable condition with no unaddressed  concerns.          Final Clinical Impression(s) / ED Diagnoses Final diagnoses:  Encounter for medical screening examination    Rx / DC Orders ED Discharge Orders          Ordered    Ambulatory referral to Obstetrics / Gynecology        12/18/23 0247              Antony Madura, PA-C 12/18/23 0255    Dione Booze, MD 12/18/23 [phone]

## 2023-12-18 NOTE — Discharge Instructions (Addendum)
 Follow up with your primary care doctor. You may also see an OBGYN provider, if desired. Return for new or concerning symptoms.

## 2023-12-18 NOTE — ED Triage Notes (Addendum)
 Patient arrives via [facility] from home and states she is pregnant and in labor. Seen recently for the same with negative pregnancy test. States LMP was "years ago."

## 2023-12-20 ENCOUNTER — Other Ambulatory Visit: Payer: Self-pay

## 2023-12-20 ENCOUNTER — Emergency Department (HOSPITAL_COMMUNITY)
Admission: EM | Admit: 2023-12-20 | Discharge: 2023-12-20 | Disposition: A | Discharge status: Home / Self Care | Attending: Emergency Medicine | Admitting: Emergency Medicine

## 2023-12-20 ENCOUNTER — Encounter (HOSPITAL_COMMUNITY): Payer: Self-pay

## 2023-12-20 DIAGNOSIS — R438 Other disturbances of smell and taste: Secondary | ICD-10-CM | POA: Diagnosis present

## 2023-12-20 MED ORDER — ORAL CARE MOUTH RINSE: 15.0000 mL | OROMUCOSAL | Status: DC | PRN

## 2023-12-20 NOTE — Discharge Instructions (Addendum)
 We evaluated you for your bad taste in your mouth.  We do not think you are having a medical emergency at this time.  Please follow-up with your primary care doctor as needed.  Return to the emergency department if you have any new or worsening symptoms

## 2023-12-20 NOTE — ED Provider Notes (Signed)
 [place] EMERGENCY DEPARTMENT AT [facility] Provider Note  CSN: [id number] Arrival date & time: 12/20/23 1221  Chief Complaint(s) Mouth Issue  HPI Lindsey Richmond is a 28 y.o. female history of schizophrenia presenting to the emergency department with bad taste in mouth.  Patient reports that this morning she had a bad taste in her mouth.  She reports this started after she was thinking about how she could possibly be pregnant and then thought about having feces in her mouth, after this about the started.  She denies any nausea, vomiting, abdominal pain, tooth pain, any other symptoms.   Past Medical History Past Medical History:  Diagnosis Date   Anxiety    Depression    Oppositional defiant behavior    Schizo affective schizophrenia (HCC)    Schizophrenia [facility])    Patient Active Problem List   Diagnosis Date Noted   Catatonic schizophrenia (HCC) 10/19/2020   Catatonic state    Schizoaffective disorder, bipolar type (HCC)    Schizo-affective schizophrenia (HCC) 12/25/2019   Schizophrenia (HCC) 12/25/2019   Schizophrenia (HCC) 01/15/2019   Schizophrenia, paranoid (HCC) 04/20/2017   Home Medication(s) Prior to Admission medications   Medication Sig Start Date End Date Taking? Authorizing Provider  benztropine (COGENTIN) 1 MG tablet Take 1 tablet (1 mg total) by mouth 2 (two) times daily. For prevention of tremors Patient not taking: Reported on 07/26/2023 10/06/20   Armandina Stammer I, NP  doxycycline (VIBRAMYCIN) 100 MG capsule Take 100 mg by mouth 2 (two) times daily. Patient not taking: Reported on 07/26/2023 12/15/21   [provider]  fluPHENAZine (PROLIXIN) 10 MG tablet Take 1 tablet (10 mg total) by mouth 2 (two) times daily. For mood control Patient not taking: Reported on 07/26/2023 10/06/20   Armandina Stammer I, NP  fluPHENAZine (PROLIXIN) 5 MG tablet Take 3 tablets (15 mg total) by mouth at bedtime. Patient not taking: Reported on 07/26/2023 10/06/20   Armandina Stammer I, NP  LORazepam (ATIVAN) 2 MG tablet Take 1 tablet (2 mg total) by mouth 2 (two) times daily. For severe anxiety Patient not taking: Reported on 07/26/2023 10/06/20   Armandina Stammer I, NP  paliperidone (INVEGA SUSTENNA) 156 MG/ML SUSY injection INJECT AS DIRECTED INPATIENT ON 09/29/20 09/28/20 11/04/23  Antonieta Pert, MD  traZODone (DESYREL) 100 MG tablet Take 1 tablet (100 mg total) by mouth at bedtime. For sleep Patient not taking: Reported on 07/26/2023 10/06/20   Sanjuana Kava, NP                                                                                                                                    Past Surgical History History reviewed. No pertinent surgical history. Family History Family History  Problem Relation Age of Onset   Heart failure Mother    Hypertension Mother    Diabetes Father     Social History Social History   Tobacco Use  Smoking status: Never   Smokeless tobacco: Never  Vaping Use   Vaping status: Never Used  Substance Use Topics   Alcohol use: No   Drug use: No   Allergies Patient has no known allergies.  Review of Systems Review of Systems  All other systems reviewed and are negative.   Physical Exam Vital Signs  I have reviewed the triage vital signs BP 134/76   Pulse 61   Temp (!) 97.5 F (36.4 C) (Oral)   Resp 16   Ht 5\' 8"  (1.727 m)   Wt 108.4 kg   SpO2 100%   BMI 36.34 kg/m  Physical Exam Vitals and nursing note reviewed.  Constitutional:      Appearance: Normal appearance.  HENT:     Head: Normocephalic and atraumatic.     Mouth/Throat:     Mouth: Mucous membranes are moist.     Comments: Oropharynx clear, slightly poor dentition Eyes:     Conjunctiva/sclera: Conjunctivae normal.  Cardiovascular:     Rate and Rhythm: Normal rate.  Pulmonary:     Effort: Pulmonary effort is normal. No respiratory distress.  Abdominal:     General: Abdomen is flat.  Musculoskeletal:        General: No deformity.   Skin:    General: Skin is warm and dry.     Capillary Refill: Capillary refill takes less than 2 seconds.  Neurological:     General: No focal deficit present.     Mental Status: She is alert. Mental status is at baseline.  Psychiatric:        Mood and Affect: Mood normal.        Behavior: Behavior normal.     Comments: Not reacting to internal stimuli, denies auditory hallucinations, denies homicidal or suicidal ideation.     ED Results and Treatments Labs (all labs ordered are listed, but only abnormal results are displayed) Labs Reviewed - No data to display                                                                                                                        Radiology No results found.  Pertinent labs & imaging results that were available during my care of the patient were reviewed by me and considered in my medical decision making (see MDM for details).  Medications Ordered in ED Medications  Oral care mouth rinse (has no administration in time range)  Procedures Procedures  (including critical care time)  Medical Decision Making / ED Course   MDM:  35-year-old with bad taste in mouth.  Patient reports that the patient began after she imagined that she was pregnant and had feces getting into her mouth from the baby.  On exam, patient has normal oropharynx.  Suspect likely delusional thinking contributing but patient not reacting to internal stimuli and denies homicidal or suicidal ideations so do not think patient meets hold criteria.  Will give oral hygiene care and discharge.  Offered pregnancy test to the patient, she refused, did have negative pregnancy test earlier this month approximately 6 days ago, denies any pregnancy related complaints such as abdominal pain, vaginal bleeding.       Medicines ordered and  prescription drug management: Meds ordered this encounter  Medications   Oral care mouth rinse    -I have reviewed the patients home medicines and have made adjustments as needed  Social Determinants of Health:  Diagnosis or treatment significantly limited by social determinants of health: obesity   Co morbidities that complicate the patient evaluation  Past Medical History:  Diagnosis Date   Anxiety    Depression    Oppositional defiant behavior    Schizo affective schizophrenia (HCC)    Schizophrenia (HCC)       Dispostion: Disposition decision including need for hospitalization was considered, and patient discharged from emergency department.    Final Clinical Impression(s) / ED Diagnoses Final diagnoses:  Bad taste in mouth     This chart was dictated using voice recognition software.  Despite best efforts to proofread,  errors can occur which can change the documentation meaning.    Lonell Grandchild, MD 12/20/23 [phone]

## 2023-12-20 NOTE — ED Triage Notes (Signed)
 BIB EMS from home for foul taste in mouth. Pt states she had feces from a baby from [place] and a dog in her mouth.
